# Patient Record
Sex: Female | Born: 1950
Health system: Southern US, Community
[De-identification: ages and names within clinical notes are randomized; demographics above are authoritative.]

## PROBLEM LIST (undated history)

## (undated) DIAGNOSIS — I10 Essential (primary) hypertension: Secondary | ICD-10-CM

## (undated) DIAGNOSIS — K219 Gastro-esophageal reflux disease without esophagitis: Secondary | ICD-10-CM

## (undated) DIAGNOSIS — A419 Sepsis, unspecified organism: Secondary | ICD-10-CM

## (undated) DIAGNOSIS — K650 Generalized (acute) peritonitis: Secondary | ICD-10-CM

## (undated) DIAGNOSIS — M329 Systemic lupus erythematosus, unspecified: Secondary | ICD-10-CM

## (undated) DIAGNOSIS — N19 Unspecified kidney failure: Secondary | ICD-10-CM

## (undated) DIAGNOSIS — IMO0002 Reserved for concepts with insufficient information to code with codable children: Secondary | ICD-10-CM

## (undated) DIAGNOSIS — N186 End stage renal disease: Secondary | ICD-10-CM

## (undated) HISTORY — PX: FOOT SURGERY: SHX648

## (undated) HISTORY — DX: Essential (primary) hypertension: I10

## (undated) HISTORY — PX: SMALL INTESTINE SURGERY: SHX150

---

## 1974-11-03 HISTORY — PX: SMALL INTESTINE SURGERY: SHX150

## 2019-07-28 HISTORY — PX: PERITONEAL CATHETER INSERTION: SHX2223

## 2019-09-27 HISTORY — PX: PERITONEAL CATHETER INSERTION: SHX2223

## 2020-04-23 ENCOUNTER — Other Ambulatory Visit: Payer: Self-pay

## 2020-04-23 ENCOUNTER — Encounter (HOSPITAL_COMMUNITY): Payer: Self-pay | Admitting: Emergency Medicine

## 2020-04-23 ENCOUNTER — Emergency Department (HOSPITAL_COMMUNITY)
Admission: EM | Admit: 2020-04-23 | Discharge: 2020-04-23 | Disposition: A | Discharge status: Home / Self Care | Payer: Medicare Other | Attending: Emergency Medicine | Admitting: Emergency Medicine

## 2020-04-23 DIAGNOSIS — Z5321 Procedure and treatment not carried out due to patient leaving prior to being seen by health care provider: Secondary | ICD-10-CM | POA: Diagnosis not present

## 2020-04-23 DIAGNOSIS — R000 Tachycardia, unspecified: Secondary | ICD-10-CM | POA: Insufficient documentation

## 2020-04-23 HISTORY — DX: Unspecified kidney failure: N19

## 2020-04-23 HISTORY — DX: Reserved for concepts with insufficient information to code with codable children: IMO0002

## 2020-04-23 HISTORY — DX: Systemic lupus erythematosus, unspecified: M32.9

## 2020-04-23 NOTE — ED Triage Notes (Signed)
Pt. Stated, I just finished perotoneal dialysis and watching TV and I bent over and my heart was racing. Denies any other symptoms

## 2020-04-23 NOTE — ED Notes (Signed)
Pt stated she is  Feeling better and b/c of wait she is going to follow up with her PCP instead. Pt contacted her daughter and has left

## 2020-05-20 NOTE — Progress Notes (Signed)
Cardiology Office Note:   Date:  05/21/2020  NAME:  Sheena Smith    MRN: [id number] DOB:  June 03, 1951   PCP:  No primary care provider on file.  Cardiologist:  No primary care provider on file.  Electrophysiologist:  None   Referring MD: [name], *   Chief Complaint  Patient presents with  . Tachycardia   History of Present Illness:   Sheena Smith is a 69 y.o. female with a hx of ESRD on PD who is being seen today for the evaluation of tachycardia/palpitations at the request of [name], *. She was seen in the ER 04/23/2020 and EKG showed tachycardia 124 bpm. Appears to be typical AVNRT. Left ER as felt better and was not evaluated.   She reports on 621 when she was evaluated emergency room she had just finished her peritoneal dialysis session.  Apparently she bended over and then noticed her heart was racing.  She reports that she tried deep breathing and exercise to calm herself but her heart was continuing to race.  She did not pass out or have any shortness of breath.  She did not have any chest pain she just did not feel well with her heart racing.  While waiting in the emergency room her arrhythmia broke and she went home.  She reports in January of this year she had a similar episode in [place].  She reports the episode lasted up to 1 hour and resolved without intervention.  She is never had a history of heart troubles.  She apparently was started on peritoneal dialysis in October 2020.  This was in [place].  She is recently relocated to [place] to be closer to children.  Her husband passed in February of this year due to Covid.  She does not drink alcohol or use any drugs.  She is a former smoker but smoked in her 67s for about 2 years.  She apparently is working with [facility] for possible kidney transplantation.  No recent labs in our system.  No history of heart disease.  She reports that she does not exercise but has no limitations with her current  level of activity.  This includes doing things around the house.  She has no chest pain or shortness of breath or palpitations with her current level of activity.  Her EKG demonstrates normal sinus rhythm.  1. ESRD on PD -Lupus nephritis  Past Medical History: Past Medical History:  Diagnosis Date  . Hypertension   . Lupus ([place])   . Renal failure     Past Surgical History: Past Surgical History:  Procedure Laterality Date  . FOOT SURGERY    . SMALL INTESTINE SURGERY      Current Medications: Current Meds  Medication Sig  . amLODipine (NORVASC) 10 MG tablet Take 1 tablet by mouth daily.  . hydrALAZINE (APRESOLINE) 25 MG tablet Take 25 mg by mouth 3 (three) times daily.  . hydroxychloroquine (PLAQUENIL) 200 MG tablet Take 1 tablet by mouth daily.  . propranolol (INDERAL) 20 MG tablet Take 1 and one-half tablets by mouth 3 times a day and 2 tablets at bedtime  . sevelamer carbonate (RENVELA) 2.4 g PACK Take 1 packet orally as directed 3 times daily with meal  . sodium bicarbonate 650 MG tablet Take by mouth.     Allergies:    Other, Atenolol, and Sulfur   Social History: Social History   Socioeconomic History  . Marital status: Widowed  Spouse name: Not on file  . Number of children: Not on file  . Years of education: Not on file  . Highest education level: Not on file  Occupational History  . Not on file  Tobacco Use  . Smoking status: Former Smoker    Years: 2.00  . Smokeless tobacco: Never Used  Substance and Sexual Activity  . Alcohol use: Not Currently  . Drug use: Not Currently  . Sexual activity: Not on file  Other Topics Concern  . Not on file  Social History Narrative  . Not on file   Social Determinants of Health   Financial Resource Strain:   . Difficulty of Paying Living Expenses:   Food Insecurity:   . Worried About Charity fundraiser in the Last Year:   . Arboriculturist in the Last Year:   Transportation Needs:   . Lexicographer (Medical):   Marland Kitchen Lack of Transportation (Non-Medical):   Physical Activity:   . Days of Exercise per Week:   . Minutes of Exercise per Session:   Stress:   . Feeling of Stress :   Social Connections:   . Frequency of Communication with Friends and Family:   . Frequency of Social Gatherings with Friends and Family:   . Attends Religious Services:   . Active Member of Clubs or Organizations:   . Attends Archivist Meetings:   Marland Kitchen Marital Status:      Family History: The patient's family history includes Diabetes in her father; Hypertension in her father; Ovarian cancer in her mother.  ROS:   All other ROS reviewed and negative. Pertinent positives noted in the HPI.     EKGs/Labs/Other Studies Reviewed:   The following studies were personally reviewed by me today:  EKG:  EKG is ordered today.  The ekg ordered today demonstrates normal sinus rhythm, heart rate 78, no acute ST-T changes, no evidence of prior infarction, and was personally reviewed by me.   Recent Labs: No results found for requested labs within last 8760 hours.   Recent Lipid Panel No results found for: CHOL, TRIG, HDL, CHOLHDL, VLDL, LDLCALC, LDLDIRECT  Physical Exam:   VS:  BP (!) 152/84   Pulse 78   Ht 5\' 2"  (1.575 m)   Wt 118 lb 12.8 oz (53.9 kg)   SpO2 97%   BMI 21.73 kg/m    Wt Readings from Last 3 Encounters:  05/21/20 118 lb 12.8 oz (53.9 kg)  04/23/20 120 lb (54.4 kg)    General: Well nourished, well developed, in no acute distress Heart: Atraumatic, normal size  Eyes: PEERLA, EOMI  Neck: Supple, no JVD Endocrine: No thryomegaly Cardiac: Normal S1, S2; RRR; no murmurs, rubs, or gallops Lungs: Clear to auscultation bilaterally, no wheezing, rhonchi or rales  Abd: Soft, nontender, no hepatomegaly  Ext: No edema, pulses 2+ Musculoskeletal: No deformities, BUE and BLE strength normal and equal Skin: Warm and dry, no rashes   Neuro: Alert and oriented to person, place, time,  and situation, CNII-XII grossly intact, no focal deficits  Psych: Normal mood and affect   ASSESSMENT:   Sheena Smith is a 69 y.o. female who presents for the following: 1. Tachycardia   2. SVT (supraventricular tachycardia) (HCC)     PLAN:   1. Tachycardia 2. SVT (supraventricular tachycardia) ([place]) -I reviewed her EKG from 04/23/2020.  This demonstrates a supraventricular tachycardia with heart rate 124.  She appears to have pseudo-R waves in lead  V1.  I suspect this is a typical AVNRT.  Her EKG today demonstrates normal sinus rhythm with heart rate 78.  She does not have the pseudo-R wave in lead V1.  She also likely has a pseudo-S wave in the inferior leads on EKG.  This has happened twice per her report.  We will check CBC, TSH, CMP today.  She reports dialysis is going well.  She will need an echocardiogram.  I did discuss with her that since she is had this twice I would recommend an SVT ablation.  We will refer her to EP today.  She would like to hear more about the procedure.  In the meantime she will let us know if it happens again.  She is a bit unsure if she will proceed with ablation but I think she should hear from EP about the procedure.  I will see her back in 6 months.  She will let us know if she has any further episodes.  Disposition: Return in about 6 months (around 11/21/2020).  Medication Adjustments/Labs and Tests Ordered: Current medicines are reviewed at length with the patient today.  Concerns regarding medicines are outlined above.  Orders Placed This Encounter  Procedures  . TSH  . Comprehensive metabolic panel  . CBC  . Ambulatory referral to Cardiac Electrophysiology  . EKG 12-Lead  . ECHOCARDIOGRAM COMPLETE   No orders of the defined types were placed in this encounter.   Patient Instructions  Medication Instructions:  The current medical regimen is effective;  continue present plan and medications.  *If you need a refill on your cardiac medications  before your next appointment, please call your pharmacy*   Lab Work: TSH, CMET, CBC today   If you have labs (blood work) drawn today and your tests are completely normal, you will receive your results only by: Marland Kitchen MyChart Message (if you have MyChart) OR . A paper copy in the mail If you have any lab test that is abnormal or we need to change your treatment, we will call you to review the results.   Testing/Procedures: Echocardiogram - Your physician has requested that you have an echocardiogram. Echocardiography is a painless test that uses sound waves to create images of your heart. It provides your doctor with information about the size and shape of your heart and how well your heart's chambers and valves are working. This procedure takes approximately one hour. There are no restrictions for this procedure. This will be performed at our [facility] location - [address], [address].    Follow-Up: At [facility], you and your health needs are our priority.  As part of our continuing mission to provide you with exceptional heart care, we have created designated Provider Care Teams.  These Care Teams include your primary Cardiologist (physician) and Advanced Practice Providers (APPs -  Physician Assistants and Nurse Practitioners) who all work together to provide you with the care you need, when you need it.  We recommend signing up for the patient portal called "MyChart".  Sign up information is provided on this After Visit Summary.  MyChart is used to connect with patients for Virtual Visits (Telemedicine).  Patients are able to view lab/test results, encounter notes, upcoming appointments, etc.  Non-urgent messages can be sent to your provider as well.   To learn more about what you can do with MyChart, go to [email or web address].    Your next appointment:   6 month(s)  The format for your next  appointment:   In Person  Provider:   Eleonore Chiquito, MD   Other  Instructions Referral to EP: they will contact you for an appointment.    Signed, Addison Naegeli. Audie Box, [place]  [address], [place], [place] [address] [phone]  05/21/2020 8:38 AM

## 2020-05-21 ENCOUNTER — Encounter: Payer: Self-pay | Admitting: Cardiovascular Disease

## 2020-05-21 ENCOUNTER — Other Ambulatory Visit: Payer: Self-pay

## 2020-05-21 ENCOUNTER — Ambulatory Visit: Payer: Medicare Other | Admitting: Cardiovascular Disease

## 2020-05-21 VITALS — BP 152/84 | HR 78 | Ht 62.0 in | Wt 118.8 lb

## 2020-05-21 DIAGNOSIS — R000 Tachycardia, unspecified: Secondary | ICD-10-CM

## 2020-05-21 DIAGNOSIS — I471 Supraventricular tachycardia: Secondary | ICD-10-CM | POA: Diagnosis not present

## 2020-05-21 LAB — COMPREHENSIVE METABOLIC PANEL
ALT: 5 IU/L (ref 0–32)
AST: 17 IU/L (ref 0–40)
Albumin/Globulin Ratio: 1.1 — ABNORMAL LOW (ref 1.2–2.2)
Albumin: 3.2 g/dL — ABNORMAL LOW (ref 3.8–4.8)
Alkaline Phosphatase: 70 IU/L (ref 48–121)
BUN/Creatinine Ratio: 6 — ABNORMAL LOW (ref 12–28)
BUN: 57 mg/dL — ABNORMAL HIGH (ref 8–27)
Bilirubin Total: 0.4 mg/dL (ref 0.0–1.2)
CO2: 21 mmol/L (ref 20–29)
Calcium: 6.8 mg/dL — CL (ref 8.7–10.3)
Chloride: 99 mmol/L (ref 96–106)
Creatinine, Ser: 8.98 mg/dL — ABNORMAL HIGH (ref 0.57–1.00)
GFR calc Af Amer: 5 mL/min/{1.73_m2} — ABNORMAL LOW (ref >59)
GFR calc non Af Amer: 4 mL/min/{1.73_m2} — ABNORMAL LOW (ref >59)
Globulin, Total: 3 g/dL (ref 1.5–4.5)
Glucose: 96 mg/dL (ref 65–99)
Potassium: 4.2 mmol/L (ref 3.5–5.2)
Sodium: 137 mmol/L (ref 134–144)
Total Protein: 6.2 g/dL (ref 6.0–8.5)

## 2020-05-21 LAB — CBC
Hematocrit: 34.8 % (ref 34.0–46.6)
Hemoglobin: 10.9 g/dL — ABNORMAL LOW (ref 11.1–15.9)
MCH: 28 pg (ref 26.6–33.0)
MCHC: 31.3 g/dL — ABNORMAL LOW (ref 31.5–35.7)
MCV: 90 fL (ref 79–97)
Platelets: 255 10*3/uL (ref 150–450)
RBC: 3.89 x10E6/uL (ref 3.77–5.28)
RDW: 15.1 % (ref 11.7–15.4)
WBC: 3.7 10*3/uL (ref 3.4–10.8)

## 2020-05-21 LAB — TSH: TSH: 3.86 u[IU]/mL (ref 0.450–4.500)

## 2020-05-21 NOTE — Patient Instructions (Signed)
Medication Instructions:  The current medical regimen is effective;  continue present plan and medications.  *If you need a refill on your cardiac medications before your next appointment, please call your pharmacy*   Lab Work: TSH, CMET, CBC today   If you have labs (blood work) drawn today and your tests are completely normal, you will receive your results only by: Marland Kitchen MyChart Message (if you have MyChart) OR . A paper copy in the mail If you have any lab test that is abnormal or we need to change your treatment, we will call you to review the results.   Testing/Procedures: Echocardiogram - Your physician has requested that you have an echocardiogram. Echocardiography is a painless test that uses sound waves to create images of your heart. It provides your doctor with information about the size and shape of your heart and how well your heart's chambers and valves are working. This procedure takes approximately one hour. There are no restrictions for this procedure. This will be performed at our [facility] location - [address].    Follow-Up: At [facility], you and your health needs are our priority.  As part of our continuing mission to provide you with exceptional heart care, we have created designated Provider Care Teams.  These Care Teams include your primary Cardiologist (physician) and Advanced Practice Providers (APPs -  Physician Assistants and Nurse Practitioners) who all work together to provide you with the care you need, when you need it.  We recommend signing up for the patient portal called "MyChart".  Sign up information is provided on this After Visit Summary.  MyChart is used to connect with patients for Virtual Visits (Telemedicine).  Patients are able to view lab/test results, encounter notes, upcoming appointments, etc.  Non-urgent messages can be sent to your provider as well.   To learn more about what you can do with MyChart, go to  [email or web address].    Your next appointment:   6 month(s)  The format for your next appointment:   In Person  Provider:   Eleonore Chiquito, MD   Other Instructions Referral to EP: they will contact you for an appointment.

## 2020-05-22 ENCOUNTER — Encounter: Payer: Self-pay | Admitting: Internal Medicine

## 2020-05-22 ENCOUNTER — Ambulatory Visit (INDEPENDENT_AMBULATORY_CARE_PROVIDER_SITE_OTHER): Payer: Medicare Other | Admitting: Internal Medicine

## 2020-05-22 VITALS — BP 160/80 | HR 70 | Ht 62.0 in | Wt 119.0 lb

## 2020-05-22 DIAGNOSIS — I471 Supraventricular tachycardia: Secondary | ICD-10-CM

## 2020-05-22 DIAGNOSIS — R000 Tachycardia, unspecified: Secondary | ICD-10-CM

## 2020-05-22 NOTE — Patient Instructions (Addendum)
Medication Instructions:  Your physician recommends that you continue on your current medications as directed. Please refer to the Current Medication list given to you today.  *If you need a refill on your cardiac medications before your next appointment, please call your pharmacy*  Lab Work: None ordered.  If you have labs (blood work) drawn today and your tests are completely normal, you will receive your results only by: Marland Kitchen MyChart Message (if you have MyChart) OR . A paper copy in the mail If you have any lab test that is abnormal or we need to change your treatment, we will call you to review the results.  Testing/Procedures: None ordered.  Follow-Up: At [facility], you and your health needs are our priority.  As part of our continuing mission to provide you with exceptional heart care, we have created designated Provider Care Teams.  These Care Teams include your primary Cardiologist (physician) and Advanced Practice Providers (APPs -  Physician Assistants and Nurse Practitioners) who all work together to provide you with the care you need, when you need it.  We recommend signing up for the patient portal called "MyChart".  Sign up information is provided on this After Visit Summary.  MyChart is used to connect with patients for Virtual Visits (Telemedicine).  Patients are able to view lab/test results, encounter notes, upcoming appointments, etc.  Non-urgent messages can be sent to your provider as well.   To learn more about what you can do with MyChart, go to [email or web address].    Your next appointment:   Your physician wants you to follow-up in: As needed. You will receive a reminder letter in the mail two months in advance. If you don't receive a letter, please call our office to schedule the follow-up appointment.   Other Instructions:

## 2020-05-22 NOTE — Progress Notes (Signed)
HPI Sheena Smith is referred today by Dr. Audie Box for evaluation of SVT. She is a pleasant 69 yo woman with ESRD on peritoneal dialysis. She has had 2 episodes of SVT. These start and stop suddenly and she has had HR's and documented SVT at 125/min. She notes that they start if she bends over. She has taken an extra inderal. She denies associated chest pain, sob, or syncope.  Allergies  Allergen Reactions  . Other     Per pt, states that she was informed she was allergic to sulfa after allergy testing  "Kidney Disease. Exception to this NSAID intolerance is aspirin 81-325mg  daily"'  . Atenolol     palpitation  . Sulfur      Current Outpatient Medications  Medication Sig Dispense Refill  . amLODipine (NORVASC) 10 MG tablet Take 1 tablet by mouth daily.    . hydrALAZINE (APRESOLINE) 25 MG tablet Take 25 mg by mouth 3 (three) times daily.    . hydroxychloroquine (PLAQUENIL) 200 MG tablet Take 1 tablet by mouth daily.    . propranolol (INDERAL) 20 MG tablet Take 1 and one-half tablets by mouth 3 times a day and 2 tablets at bedtime    . sevelamer carbonate (RENVELA) 2.4 g PACK Take 1 packet orally as directed 3 times daily with meal    . sodium bicarbonate 650 MG tablet Take by mouth.     No current facility-administered medications for this visit.     Past Medical History:  Diagnosis Date  . Hypertension   . Lupus ([place])   . Renal failure     ROS:   All systems reviewed and negative except as noted in the HPI.   Past Surgical History:  Procedure Laterality Date  . FOOT SURGERY    . SMALL INTESTINE SURGERY       Family History  Problem Relation Age of Onset  . Ovarian cancer Mother   . Hypertension Father   . Diabetes Father      Social History   Socioeconomic History  . Marital status: Widowed    Spouse name: Not on file  . Number of children: Not on file  . Years of education: Not on file  . Highest education level: Not on file  Occupational History  .  Not on file  Tobacco Use  . Smoking status: Former Smoker    Years: 2.00  . Smokeless tobacco: Never Used  Substance and Sexual Activity  . Alcohol use: Not Currently  . Drug use: Not Currently  . Sexual activity: Not on file  Other Topics Concern  . Not on file  Social History Narrative  . Not on file   Social Determinants of Health   Financial Resource Strain:   . Difficulty of Paying Living Expenses:   Food Insecurity:   . Worried About Charity fundraiser in the Last Year:   . Arboriculturist in the Last Year:   Transportation Needs:   . Film/video editor (Medical):   Marland Kitchen Lack of Transportation (Non-Medical):   Physical Activity:   . Days of Exercise per Week:   . Minutes of Exercise per Session:   Stress:   . Feeling of Stress :   Social Connections:   . Frequency of Communication with Friends and Family:   . Frequency of Social Gatherings with Friends and Family:   . Attends Religious Services:   . Active Member of Clubs or Organizations:   . Attends  Club or Organization Meetings:   Marland Kitchen Marital Status:   Intimate Partner Violence:   . Fear of Current or Ex-Partner:   . Emotionally Abused:   Marland Kitchen Physically Abused:   . Sexually Abused:      BP (!) 160/80   Pulse 70   Ht 5\' 2"  (1.575 m)   Wt 119 lb (54 kg)   SpO2 96%   BMI 21.77 kg/m   Physical Exam:  Well appearing NAD HEENT: Unremarkable Neck:  No JVD, no thyromegally Lymphatics:  No adenopathy Back:  No CVA tenderness Lungs:  Clear with no wheezes HEART:  Regular rate rhythm, no murmurs, no rubs, no clicks Abd:  soft, positive bowel sounds, no organomegally, no rebound, no guarding Ext:  2 plus pulses, no edema, no cyanosis, no clubbing Skin:  No rashes no nodules Neuro:  CN II through XII intact, motor grossly intact  EKG - nsr with no ventricular pre-excitation   Assess/Plan: 1. SVT - She most likely has AVNRT. I discussed the treatment options including catheter ablation. However, she is  not at this point symptomatic enough for me to recommend ablation. I have recommended watchful waiting. If her symptoms worsen then ablation would be warranted.  2. HTN - her bp is controlled today.   Ponciano Ort,

## 2020-06-03 DIAGNOSIS — N186 End stage renal disease: Secondary | ICD-10-CM | POA: Diagnosis not present

## 2020-06-03 DIAGNOSIS — Z992 Dependence on renal dialysis: Secondary | ICD-10-CM | POA: Diagnosis not present

## 2020-06-04 DIAGNOSIS — D509 Iron deficiency anemia, unspecified: Secondary | ICD-10-CM | POA: Diagnosis not present

## 2020-06-04 DIAGNOSIS — N186 End stage renal disease: Secondary | ICD-10-CM | POA: Diagnosis not present

## 2020-06-04 DIAGNOSIS — Z992 Dependence on renal dialysis: Secondary | ICD-10-CM | POA: Diagnosis not present

## 2020-06-05 DIAGNOSIS — N186 End stage renal disease: Secondary | ICD-10-CM | POA: Diagnosis not present

## 2020-06-05 DIAGNOSIS — Z992 Dependence on renal dialysis: Secondary | ICD-10-CM | POA: Diagnosis not present

## 2020-06-06 DIAGNOSIS — Z992 Dependence on renal dialysis: Secondary | ICD-10-CM | POA: Diagnosis not present

## 2020-06-06 DIAGNOSIS — N186 End stage renal disease: Secondary | ICD-10-CM | POA: Diagnosis not present

## 2020-06-07 DIAGNOSIS — Z992 Dependence on renal dialysis: Secondary | ICD-10-CM | POA: Diagnosis not present

## 2020-06-07 DIAGNOSIS — N186 End stage renal disease: Secondary | ICD-10-CM | POA: Diagnosis not present

## 2020-06-08 DIAGNOSIS — N186 End stage renal disease: Secondary | ICD-10-CM | POA: Diagnosis not present

## 2020-06-08 DIAGNOSIS — Z992 Dependence on renal dialysis: Secondary | ICD-10-CM | POA: Diagnosis not present

## 2020-06-09 DIAGNOSIS — N186 End stage renal disease: Secondary | ICD-10-CM | POA: Diagnosis not present

## 2020-06-09 DIAGNOSIS — Z992 Dependence on renal dialysis: Secondary | ICD-10-CM | POA: Diagnosis not present

## 2020-06-10 DIAGNOSIS — Z992 Dependence on renal dialysis: Secondary | ICD-10-CM | POA: Diagnosis not present

## 2020-06-10 DIAGNOSIS — N186 End stage renal disease: Secondary | ICD-10-CM | POA: Diagnosis not present

## 2020-06-11 DIAGNOSIS — Z992 Dependence on renal dialysis: Secondary | ICD-10-CM | POA: Diagnosis not present

## 2020-06-11 DIAGNOSIS — N186 End stage renal disease: Secondary | ICD-10-CM | POA: Diagnosis not present

## 2020-06-12 DIAGNOSIS — Z992 Dependence on renal dialysis: Secondary | ICD-10-CM | POA: Diagnosis not present

## 2020-06-12 DIAGNOSIS — N186 End stage renal disease: Secondary | ICD-10-CM | POA: Diagnosis not present

## 2020-06-13 DIAGNOSIS — N186 End stage renal disease: Secondary | ICD-10-CM | POA: Diagnosis not present

## 2020-06-13 DIAGNOSIS — Z992 Dependence on renal dialysis: Secondary | ICD-10-CM | POA: Diagnosis not present

## 2020-06-14 DIAGNOSIS — N186 End stage renal disease: Secondary | ICD-10-CM | POA: Diagnosis not present

## 2020-06-14 DIAGNOSIS — Z992 Dependence on renal dialysis: Secondary | ICD-10-CM | POA: Diagnosis not present

## 2020-06-15 DIAGNOSIS — Z992 Dependence on renal dialysis: Secondary | ICD-10-CM | POA: Diagnosis not present

## 2020-06-15 DIAGNOSIS — N186 End stage renal disease: Secondary | ICD-10-CM | POA: Diagnosis not present

## 2020-06-16 DIAGNOSIS — Z992 Dependence on renal dialysis: Secondary | ICD-10-CM | POA: Diagnosis not present

## 2020-06-16 DIAGNOSIS — N186 End stage renal disease: Secondary | ICD-10-CM | POA: Diagnosis not present

## 2020-06-17 DIAGNOSIS — N186 End stage renal disease: Secondary | ICD-10-CM | POA: Diagnosis not present

## 2020-06-17 DIAGNOSIS — Z992 Dependence on renal dialysis: Secondary | ICD-10-CM | POA: Diagnosis not present

## 2020-06-18 DIAGNOSIS — Z992 Dependence on renal dialysis: Secondary | ICD-10-CM | POA: Diagnosis not present

## 2020-06-18 DIAGNOSIS — N186 End stage renal disease: Secondary | ICD-10-CM | POA: Diagnosis not present

## 2020-06-19 DIAGNOSIS — N186 End stage renal disease: Secondary | ICD-10-CM | POA: Diagnosis not present

## 2020-06-19 DIAGNOSIS — Z992 Dependence on renal dialysis: Secondary | ICD-10-CM | POA: Diagnosis not present

## 2020-06-20 DIAGNOSIS — Z992 Dependence on renal dialysis: Secondary | ICD-10-CM | POA: Diagnosis not present

## 2020-06-20 DIAGNOSIS — N186 End stage renal disease: Secondary | ICD-10-CM | POA: Diagnosis not present

## 2020-06-21 DIAGNOSIS — N186 End stage renal disease: Secondary | ICD-10-CM | POA: Diagnosis not present

## 2020-06-21 DIAGNOSIS — Z992 Dependence on renal dialysis: Secondary | ICD-10-CM | POA: Diagnosis not present

## 2020-06-22 DIAGNOSIS — N186 End stage renal disease: Secondary | ICD-10-CM | POA: Diagnosis not present

## 2020-06-22 DIAGNOSIS — Z992 Dependence on renal dialysis: Secondary | ICD-10-CM | POA: Diagnosis not present

## 2020-06-23 DIAGNOSIS — N186 End stage renal disease: Secondary | ICD-10-CM | POA: Diagnosis not present

## 2020-06-23 DIAGNOSIS — Z992 Dependence on renal dialysis: Secondary | ICD-10-CM | POA: Diagnosis not present

## 2020-06-24 DIAGNOSIS — N186 End stage renal disease: Secondary | ICD-10-CM | POA: Diagnosis not present

## 2020-06-24 DIAGNOSIS — Z992 Dependence on renal dialysis: Secondary | ICD-10-CM | POA: Diagnosis not present

## 2020-06-25 DIAGNOSIS — N186 End stage renal disease: Secondary | ICD-10-CM | POA: Diagnosis not present

## 2020-06-25 DIAGNOSIS — Z992 Dependence on renal dialysis: Secondary | ICD-10-CM | POA: Diagnosis not present

## 2020-06-26 DIAGNOSIS — Z992 Dependence on renal dialysis: Secondary | ICD-10-CM | POA: Diagnosis not present

## 2020-06-26 DIAGNOSIS — N186 End stage renal disease: Secondary | ICD-10-CM | POA: Diagnosis not present

## 2020-06-27 DIAGNOSIS — Z992 Dependence on renal dialysis: Secondary | ICD-10-CM | POA: Diagnosis not present

## 2020-06-27 DIAGNOSIS — N186 End stage renal disease: Secondary | ICD-10-CM | POA: Diagnosis not present

## 2020-06-28 DIAGNOSIS — N186 End stage renal disease: Secondary | ICD-10-CM | POA: Diagnosis not present

## 2020-06-28 DIAGNOSIS — Z992 Dependence on renal dialysis: Secondary | ICD-10-CM | POA: Diagnosis not present

## 2020-06-29 DIAGNOSIS — Z992 Dependence on renal dialysis: Secondary | ICD-10-CM | POA: Diagnosis not present

## 2020-06-29 DIAGNOSIS — N186 End stage renal disease: Secondary | ICD-10-CM | POA: Diagnosis not present

## 2020-06-30 DIAGNOSIS — N186 End stage renal disease: Secondary | ICD-10-CM | POA: Diagnosis not present

## 2020-06-30 DIAGNOSIS — Z992 Dependence on renal dialysis: Secondary | ICD-10-CM | POA: Diagnosis not present

## 2020-07-01 DIAGNOSIS — Z992 Dependence on renal dialysis: Secondary | ICD-10-CM | POA: Diagnosis not present

## 2020-07-01 DIAGNOSIS — N186 End stage renal disease: Secondary | ICD-10-CM | POA: Diagnosis not present

## 2020-07-02 ENCOUNTER — Other Ambulatory Visit (HOSPITAL_COMMUNITY): Payer: Medicare Other

## 2020-07-02 DIAGNOSIS — N186 End stage renal disease: Secondary | ICD-10-CM | POA: Diagnosis not present

## 2020-07-02 DIAGNOSIS — Z992 Dependence on renal dialysis: Secondary | ICD-10-CM | POA: Diagnosis not present

## 2020-07-03 DIAGNOSIS — Z992 Dependence on renal dialysis: Secondary | ICD-10-CM | POA: Diagnosis not present

## 2020-07-03 DIAGNOSIS — N186 End stage renal disease: Secondary | ICD-10-CM | POA: Diagnosis not present

## 2020-07-04 ENCOUNTER — Encounter (HOSPITAL_COMMUNITY): Payer: Self-pay | Admitting: Cardiovascular Disease

## 2020-07-04 DIAGNOSIS — N186 End stage renal disease: Secondary | ICD-10-CM | POA: Diagnosis not present

## 2020-07-04 DIAGNOSIS — Z992 Dependence on renal dialysis: Secondary | ICD-10-CM | POA: Diagnosis not present

## 2020-07-05 DIAGNOSIS — Z992 Dependence on renal dialysis: Secondary | ICD-10-CM | POA: Diagnosis not present

## 2020-07-05 DIAGNOSIS — N186 End stage renal disease: Secondary | ICD-10-CM | POA: Diagnosis not present

## 2020-07-06 DIAGNOSIS — N186 End stage renal disease: Secondary | ICD-10-CM | POA: Diagnosis not present

## 2020-07-06 DIAGNOSIS — Z992 Dependence on renal dialysis: Secondary | ICD-10-CM | POA: Diagnosis not present

## 2020-07-07 DIAGNOSIS — N186 End stage renal disease: Secondary | ICD-10-CM | POA: Diagnosis not present

## 2020-07-07 DIAGNOSIS — Z992 Dependence on renal dialysis: Secondary | ICD-10-CM | POA: Diagnosis not present

## 2020-07-08 DIAGNOSIS — N186 End stage renal disease: Secondary | ICD-10-CM | POA: Diagnosis not present

## 2020-07-08 DIAGNOSIS — Z992 Dependence on renal dialysis: Secondary | ICD-10-CM | POA: Diagnosis not present

## 2020-07-09 DIAGNOSIS — N186 End stage renal disease: Secondary | ICD-10-CM | POA: Diagnosis not present

## 2020-07-09 DIAGNOSIS — Z992 Dependence on renal dialysis: Secondary | ICD-10-CM | POA: Diagnosis not present

## 2020-07-10 DIAGNOSIS — Z136 Encounter for screening for cardiovascular disorders: Secondary | ICD-10-CM | POA: Diagnosis not present

## 2020-07-10 DIAGNOSIS — Z992 Dependence on renal dialysis: Secondary | ICD-10-CM | POA: Diagnosis not present

## 2020-07-10 DIAGNOSIS — N186 End stage renal disease: Secondary | ICD-10-CM | POA: Diagnosis not present

## 2020-07-10 DIAGNOSIS — I151 Hypertension secondary to other renal disorders: Secondary | ICD-10-CM | POA: Diagnosis not present

## 2020-07-11 DIAGNOSIS — Z992 Dependence on renal dialysis: Secondary | ICD-10-CM | POA: Diagnosis not present

## 2020-07-11 DIAGNOSIS — D509 Iron deficiency anemia, unspecified: Secondary | ICD-10-CM | POA: Diagnosis not present

## 2020-07-11 DIAGNOSIS — N186 End stage renal disease: Secondary | ICD-10-CM | POA: Diagnosis not present

## 2020-07-12 DIAGNOSIS — I151 Hypertension secondary to other renal disorders: Secondary | ICD-10-CM | POA: Diagnosis not present

## 2020-07-12 DIAGNOSIS — Z992 Dependence on renal dialysis: Secondary | ICD-10-CM | POA: Diagnosis not present

## 2020-07-12 DIAGNOSIS — Z0000 Encounter for general adult medical examination without abnormal findings: Secondary | ICD-10-CM | POA: Diagnosis not present

## 2020-07-12 DIAGNOSIS — N186 End stage renal disease: Secondary | ICD-10-CM | POA: Diagnosis not present

## 2020-07-13 DIAGNOSIS — N186 End stage renal disease: Secondary | ICD-10-CM | POA: Diagnosis not present

## 2020-07-13 DIAGNOSIS — Z992 Dependence on renal dialysis: Secondary | ICD-10-CM | POA: Diagnosis not present

## 2020-07-14 DIAGNOSIS — Z992 Dependence on renal dialysis: Secondary | ICD-10-CM | POA: Diagnosis not present

## 2020-07-14 DIAGNOSIS — N186 End stage renal disease: Secondary | ICD-10-CM | POA: Diagnosis not present

## 2020-07-15 DIAGNOSIS — Z992 Dependence on renal dialysis: Secondary | ICD-10-CM | POA: Diagnosis not present

## 2020-07-15 DIAGNOSIS — N186 End stage renal disease: Secondary | ICD-10-CM | POA: Diagnosis not present

## 2020-07-16 ENCOUNTER — Telehealth (HOSPITAL_COMMUNITY): Payer: Self-pay | Admitting: Cardiovascular Disease

## 2020-07-16 DIAGNOSIS — N186 End stage renal disease: Secondary | ICD-10-CM | POA: Diagnosis not present

## 2020-07-16 DIAGNOSIS — Z992 Dependence on renal dialysis: Secondary | ICD-10-CM | POA: Diagnosis not present

## 2020-07-16 NOTE — Telephone Encounter (Signed)
Just an FYI. We have made several attempts to contact this patient including sending a letter to schedule or reschedule their echocardiogram. We will be removing the patient from the echo [place].    07/04/2020 MAILED LETTER/[facility]  07/02/20 No Show       Thank you

## 2020-07-16 NOTE — Telephone Encounter (Signed)
Noted. Thanks.

## 2020-07-17 ENCOUNTER — Other Ambulatory Visit: Payer: Self-pay | Admitting: Family Medicine

## 2020-07-17 DIAGNOSIS — E2839 Other primary ovarian failure: Secondary | ICD-10-CM

## 2020-07-17 DIAGNOSIS — Z1231 Encounter for screening mammogram for malignant neoplasm of breast: Secondary | ICD-10-CM

## 2020-07-17 DIAGNOSIS — N186 End stage renal disease: Secondary | ICD-10-CM | POA: Diagnosis not present

## 2020-07-17 DIAGNOSIS — Z992 Dependence on renal dialysis: Secondary | ICD-10-CM | POA: Diagnosis not present

## 2020-07-18 DIAGNOSIS — N186 End stage renal disease: Secondary | ICD-10-CM | POA: Diagnosis not present

## 2020-07-18 DIAGNOSIS — Z992 Dependence on renal dialysis: Secondary | ICD-10-CM | POA: Diagnosis not present

## 2020-07-19 DIAGNOSIS — N186 End stage renal disease: Secondary | ICD-10-CM | POA: Diagnosis not present

## 2020-07-19 DIAGNOSIS — M3210 Systemic lupus erythematosus, organ or system involvement unspecified: Secondary | ICD-10-CM | POA: Diagnosis not present

## 2020-07-19 DIAGNOSIS — Z992 Dependence on renal dialysis: Secondary | ICD-10-CM | POA: Diagnosis not present

## 2020-07-19 DIAGNOSIS — Z79899 Other long term (current) drug therapy: Secondary | ICD-10-CM | POA: Diagnosis not present

## 2020-07-20 DIAGNOSIS — Z992 Dependence on renal dialysis: Secondary | ICD-10-CM | POA: Diagnosis not present

## 2020-07-20 DIAGNOSIS — N186 End stage renal disease: Secondary | ICD-10-CM | POA: Diagnosis not present

## 2020-07-21 DIAGNOSIS — N186 End stage renal disease: Secondary | ICD-10-CM | POA: Diagnosis not present

## 2020-07-21 DIAGNOSIS — Z992 Dependence on renal dialysis: Secondary | ICD-10-CM | POA: Diagnosis not present

## 2020-07-22 DIAGNOSIS — N186 End stage renal disease: Secondary | ICD-10-CM | POA: Diagnosis not present

## 2020-07-22 DIAGNOSIS — Z992 Dependence on renal dialysis: Secondary | ICD-10-CM | POA: Diagnosis not present

## 2020-07-23 DIAGNOSIS — Z79899 Other long term (current) drug therapy: Secondary | ICD-10-CM | POA: Diagnosis not present

## 2020-07-23 DIAGNOSIS — H353131 Nonexudative age-related macular degeneration, bilateral, early dry stage: Secondary | ICD-10-CM | POA: Diagnosis not present

## 2020-07-23 DIAGNOSIS — M3210 Systemic lupus erythematosus, organ or system involvement unspecified: Secondary | ICD-10-CM | POA: Diagnosis not present

## 2020-07-23 DIAGNOSIS — N186 End stage renal disease: Secondary | ICD-10-CM | POA: Diagnosis not present

## 2020-07-23 DIAGNOSIS — Z992 Dependence on renal dialysis: Secondary | ICD-10-CM | POA: Diagnosis not present

## 2020-07-23 DIAGNOSIS — H3561 Retinal hemorrhage, right eye: Secondary | ICD-10-CM | POA: Diagnosis not present

## 2020-07-24 DIAGNOSIS — Z992 Dependence on renal dialysis: Secondary | ICD-10-CM | POA: Diagnosis not present

## 2020-07-24 DIAGNOSIS — N186 End stage renal disease: Secondary | ICD-10-CM | POA: Diagnosis not present

## 2020-07-25 DIAGNOSIS — N186 End stage renal disease: Secondary | ICD-10-CM | POA: Diagnosis not present

## 2020-07-25 DIAGNOSIS — Z992 Dependence on renal dialysis: Secondary | ICD-10-CM | POA: Diagnosis not present

## 2020-07-26 DIAGNOSIS — Z992 Dependence on renal dialysis: Secondary | ICD-10-CM | POA: Diagnosis not present

## 2020-07-26 DIAGNOSIS — N186 End stage renal disease: Secondary | ICD-10-CM | POA: Diagnosis not present

## 2020-07-27 DIAGNOSIS — N186 End stage renal disease: Secondary | ICD-10-CM | POA: Diagnosis not present

## 2020-07-27 DIAGNOSIS — Z992 Dependence on renal dialysis: Secondary | ICD-10-CM | POA: Diagnosis not present

## 2020-07-28 DIAGNOSIS — N186 End stage renal disease: Secondary | ICD-10-CM | POA: Diagnosis not present

## 2020-07-28 DIAGNOSIS — Z992 Dependence on renal dialysis: Secondary | ICD-10-CM | POA: Diagnosis not present

## 2020-07-29 DIAGNOSIS — Z992 Dependence on renal dialysis: Secondary | ICD-10-CM | POA: Diagnosis not present

## 2020-07-29 DIAGNOSIS — N186 End stage renal disease: Secondary | ICD-10-CM | POA: Diagnosis not present

## 2020-07-30 DIAGNOSIS — N186 End stage renal disease: Secondary | ICD-10-CM | POA: Diagnosis not present

## 2020-07-30 DIAGNOSIS — Z992 Dependence on renal dialysis: Secondary | ICD-10-CM | POA: Diagnosis not present

## 2020-07-31 DIAGNOSIS — Z992 Dependence on renal dialysis: Secondary | ICD-10-CM | POA: Diagnosis not present

## 2020-07-31 DIAGNOSIS — N186 End stage renal disease: Secondary | ICD-10-CM | POA: Diagnosis not present

## 2020-08-01 DIAGNOSIS — Z992 Dependence on renal dialysis: Secondary | ICD-10-CM | POA: Diagnosis not present

## 2020-08-01 DIAGNOSIS — N186 End stage renal disease: Secondary | ICD-10-CM | POA: Diagnosis not present

## 2020-08-02 ENCOUNTER — Ambulatory Visit: Payer: Medicare Other

## 2020-08-02 DIAGNOSIS — Z992 Dependence on renal dialysis: Secondary | ICD-10-CM | POA: Diagnosis not present

## 2020-08-02 DIAGNOSIS — N186 End stage renal disease: Secondary | ICD-10-CM | POA: Diagnosis not present

## 2020-08-03 ENCOUNTER — Ambulatory Visit (HOSPITAL_COMMUNITY): Payer: Medicare Other | Attending: Cardiovascular Disease

## 2020-08-03 ENCOUNTER — Other Ambulatory Visit: Payer: Self-pay

## 2020-08-03 DIAGNOSIS — Z992 Dependence on renal dialysis: Secondary | ICD-10-CM | POA: Diagnosis not present

## 2020-08-03 DIAGNOSIS — I471 Supraventricular tachycardia: Secondary | ICD-10-CM | POA: Insufficient documentation

## 2020-08-03 DIAGNOSIS — Z23 Encounter for immunization: Secondary | ICD-10-CM | POA: Diagnosis not present

## 2020-08-03 DIAGNOSIS — N186 End stage renal disease: Secondary | ICD-10-CM | POA: Diagnosis not present

## 2020-08-03 LAB — ECHOCARDIOGRAM COMPLETE
Area-P 1/2: 3.46 cm2
S' Lateral: 3 cm

## 2020-08-04 DIAGNOSIS — N186 End stage renal disease: Secondary | ICD-10-CM | POA: Diagnosis not present

## 2020-08-04 DIAGNOSIS — Z992 Dependence on renal dialysis: Secondary | ICD-10-CM | POA: Diagnosis not present

## 2020-08-04 DIAGNOSIS — Z23 Encounter for immunization: Secondary | ICD-10-CM | POA: Diagnosis not present

## 2020-08-05 DIAGNOSIS — Z992 Dependence on renal dialysis: Secondary | ICD-10-CM | POA: Diagnosis not present

## 2020-08-05 DIAGNOSIS — Z23 Encounter for immunization: Secondary | ICD-10-CM | POA: Diagnosis not present

## 2020-08-05 DIAGNOSIS — N186 End stage renal disease: Secondary | ICD-10-CM | POA: Diagnosis not present

## 2020-08-06 DIAGNOSIS — Z992 Dependence on renal dialysis: Secondary | ICD-10-CM | POA: Diagnosis not present

## 2020-08-06 DIAGNOSIS — Z23 Encounter for immunization: Secondary | ICD-10-CM | POA: Diagnosis not present

## 2020-08-06 DIAGNOSIS — N186 End stage renal disease: Secondary | ICD-10-CM | POA: Diagnosis not present

## 2020-08-07 DIAGNOSIS — N186 End stage renal disease: Secondary | ICD-10-CM | POA: Diagnosis not present

## 2020-08-07 DIAGNOSIS — D509 Iron deficiency anemia, unspecified: Secondary | ICD-10-CM | POA: Diagnosis not present

## 2020-08-07 DIAGNOSIS — Z992 Dependence on renal dialysis: Secondary | ICD-10-CM | POA: Diagnosis not present

## 2020-08-07 DIAGNOSIS — Z23 Encounter for immunization: Secondary | ICD-10-CM | POA: Diagnosis not present

## 2020-08-08 DIAGNOSIS — Z23 Encounter for immunization: Secondary | ICD-10-CM | POA: Diagnosis not present

## 2020-08-08 DIAGNOSIS — Z992 Dependence on renal dialysis: Secondary | ICD-10-CM | POA: Diagnosis not present

## 2020-08-08 DIAGNOSIS — N186 End stage renal disease: Secondary | ICD-10-CM | POA: Diagnosis not present

## 2020-08-09 DIAGNOSIS — N186 End stage renal disease: Secondary | ICD-10-CM | POA: Diagnosis not present

## 2020-08-09 DIAGNOSIS — Z23 Encounter for immunization: Secondary | ICD-10-CM | POA: Diagnosis not present

## 2020-08-09 DIAGNOSIS — Z992 Dependence on renal dialysis: Secondary | ICD-10-CM | POA: Diagnosis not present

## 2020-08-10 DIAGNOSIS — Z23 Encounter for immunization: Secondary | ICD-10-CM | POA: Diagnosis not present

## 2020-08-10 DIAGNOSIS — Z992 Dependence on renal dialysis: Secondary | ICD-10-CM | POA: Diagnosis not present

## 2020-08-10 DIAGNOSIS — N186 End stage renal disease: Secondary | ICD-10-CM | POA: Diagnosis not present

## 2020-08-11 DIAGNOSIS — Z992 Dependence on renal dialysis: Secondary | ICD-10-CM | POA: Diagnosis not present

## 2020-08-11 DIAGNOSIS — Z23 Encounter for immunization: Secondary | ICD-10-CM | POA: Diagnosis not present

## 2020-08-11 DIAGNOSIS — N186 End stage renal disease: Secondary | ICD-10-CM | POA: Diagnosis not present

## 2020-08-12 DIAGNOSIS — N186 End stage renal disease: Secondary | ICD-10-CM | POA: Diagnosis not present

## 2020-08-12 DIAGNOSIS — Z23 Encounter for immunization: Secondary | ICD-10-CM | POA: Diagnosis not present

## 2020-08-12 DIAGNOSIS — Z992 Dependence on renal dialysis: Secondary | ICD-10-CM | POA: Diagnosis not present

## 2020-08-13 DIAGNOSIS — Z23 Encounter for immunization: Secondary | ICD-10-CM | POA: Diagnosis not present

## 2020-08-13 DIAGNOSIS — N186 End stage renal disease: Secondary | ICD-10-CM | POA: Diagnosis not present

## 2020-08-13 DIAGNOSIS — Z992 Dependence on renal dialysis: Secondary | ICD-10-CM | POA: Diagnosis not present

## 2020-08-14 ENCOUNTER — Other Ambulatory Visit: Payer: Self-pay

## 2020-08-14 ENCOUNTER — Ambulatory Visit
Admission: RE | Admit: 2020-08-14 | Discharge: 2020-08-14 | Disposition: A | Discharge status: Home / Self Care | Payer: Medicare Other | Source: Ambulatory Visit | Attending: Family Medicine | Admitting: Family Medicine

## 2020-08-14 DIAGNOSIS — Z1231 Encounter for screening mammogram for malignant neoplasm of breast: Secondary | ICD-10-CM

## 2020-08-14 DIAGNOSIS — N186 End stage renal disease: Secondary | ICD-10-CM | POA: Diagnosis not present

## 2020-08-14 DIAGNOSIS — Z992 Dependence on renal dialysis: Secondary | ICD-10-CM | POA: Diagnosis not present

## 2020-08-14 DIAGNOSIS — Z23 Encounter for immunization: Secondary | ICD-10-CM | POA: Diagnosis not present

## 2020-08-15 DIAGNOSIS — Z23 Encounter for immunization: Secondary | ICD-10-CM | POA: Diagnosis not present

## 2020-08-15 DIAGNOSIS — Z992 Dependence on renal dialysis: Secondary | ICD-10-CM | POA: Diagnosis not present

## 2020-08-15 DIAGNOSIS — N186 End stage renal disease: Secondary | ICD-10-CM | POA: Diagnosis not present

## 2020-08-16 DIAGNOSIS — Z992 Dependence on renal dialysis: Secondary | ICD-10-CM | POA: Diagnosis not present

## 2020-08-16 DIAGNOSIS — Z23 Encounter for immunization: Secondary | ICD-10-CM | POA: Diagnosis not present

## 2020-08-16 DIAGNOSIS — N186 End stage renal disease: Secondary | ICD-10-CM | POA: Diagnosis not present

## 2020-08-17 DIAGNOSIS — N186 End stage renal disease: Secondary | ICD-10-CM | POA: Diagnosis not present

## 2020-08-17 DIAGNOSIS — Z992 Dependence on renal dialysis: Secondary | ICD-10-CM | POA: Diagnosis not present

## 2020-08-17 DIAGNOSIS — Z23 Encounter for immunization: Secondary | ICD-10-CM | POA: Diagnosis not present

## 2020-08-18 DIAGNOSIS — Z992 Dependence on renal dialysis: Secondary | ICD-10-CM | POA: Diagnosis not present

## 2020-08-18 DIAGNOSIS — Z23 Encounter for immunization: Secondary | ICD-10-CM | POA: Diagnosis not present

## 2020-08-18 DIAGNOSIS — N186 End stage renal disease: Secondary | ICD-10-CM | POA: Diagnosis not present

## 2020-08-19 DIAGNOSIS — Z992 Dependence on renal dialysis: Secondary | ICD-10-CM | POA: Diagnosis not present

## 2020-08-19 DIAGNOSIS — Z23 Encounter for immunization: Secondary | ICD-10-CM | POA: Diagnosis not present

## 2020-08-19 DIAGNOSIS — N186 End stage renal disease: Secondary | ICD-10-CM | POA: Diagnosis not present

## 2020-08-20 DIAGNOSIS — Z992 Dependence on renal dialysis: Secondary | ICD-10-CM | POA: Diagnosis not present

## 2020-08-20 DIAGNOSIS — N186 End stage renal disease: Secondary | ICD-10-CM | POA: Diagnosis not present

## 2020-08-20 DIAGNOSIS — Z23 Encounter for immunization: Secondary | ICD-10-CM | POA: Diagnosis not present

## 2020-08-21 DIAGNOSIS — Z23 Encounter for immunization: Secondary | ICD-10-CM | POA: Diagnosis not present

## 2020-08-21 DIAGNOSIS — N186 End stage renal disease: Secondary | ICD-10-CM | POA: Diagnosis not present

## 2020-08-21 DIAGNOSIS — Z992 Dependence on renal dialysis: Secondary | ICD-10-CM | POA: Diagnosis not present

## 2020-08-22 DIAGNOSIS — Z992 Dependence on renal dialysis: Secondary | ICD-10-CM | POA: Diagnosis not present

## 2020-08-22 DIAGNOSIS — N186 End stage renal disease: Secondary | ICD-10-CM | POA: Diagnosis not present

## 2020-08-22 DIAGNOSIS — Z23 Encounter for immunization: Secondary | ICD-10-CM | POA: Diagnosis not present

## 2020-08-23 DIAGNOSIS — N186 End stage renal disease: Secondary | ICD-10-CM | POA: Diagnosis not present

## 2020-08-23 DIAGNOSIS — Z23 Encounter for immunization: Secondary | ICD-10-CM | POA: Diagnosis not present

## 2020-08-23 DIAGNOSIS — Z992 Dependence on renal dialysis: Secondary | ICD-10-CM | POA: Diagnosis not present

## 2020-08-24 DIAGNOSIS — Z992 Dependence on renal dialysis: Secondary | ICD-10-CM | POA: Diagnosis not present

## 2020-08-24 DIAGNOSIS — Z23 Encounter for immunization: Secondary | ICD-10-CM | POA: Diagnosis not present

## 2020-08-24 DIAGNOSIS — N186 End stage renal disease: Secondary | ICD-10-CM | POA: Diagnosis not present

## 2020-08-25 DIAGNOSIS — Z992 Dependence on renal dialysis: Secondary | ICD-10-CM | POA: Diagnosis not present

## 2020-08-25 DIAGNOSIS — Z23 Encounter for immunization: Secondary | ICD-10-CM | POA: Diagnosis not present

## 2020-08-25 DIAGNOSIS — N186 End stage renal disease: Secondary | ICD-10-CM | POA: Diagnosis not present

## 2020-08-26 DIAGNOSIS — Z992 Dependence on renal dialysis: Secondary | ICD-10-CM | POA: Diagnosis not present

## 2020-08-26 DIAGNOSIS — Z23 Encounter for immunization: Secondary | ICD-10-CM | POA: Diagnosis not present

## 2020-08-26 DIAGNOSIS — N186 End stage renal disease: Secondary | ICD-10-CM | POA: Diagnosis not present

## 2020-08-27 DIAGNOSIS — N186 End stage renal disease: Secondary | ICD-10-CM | POA: Diagnosis not present

## 2020-08-27 DIAGNOSIS — Z23 Encounter for immunization: Secondary | ICD-10-CM | POA: Diagnosis not present

## 2020-08-27 DIAGNOSIS — Z992 Dependence on renal dialysis: Secondary | ICD-10-CM | POA: Diagnosis not present

## 2020-08-28 DIAGNOSIS — N186 End stage renal disease: Secondary | ICD-10-CM | POA: Diagnosis not present

## 2020-08-28 DIAGNOSIS — Z992 Dependence on renal dialysis: Secondary | ICD-10-CM | POA: Diagnosis not present

## 2020-08-28 DIAGNOSIS — Z23 Encounter for immunization: Secondary | ICD-10-CM | POA: Diagnosis not present

## 2020-08-29 DIAGNOSIS — Z23 Encounter for immunization: Secondary | ICD-10-CM | POA: Diagnosis not present

## 2020-08-29 DIAGNOSIS — N186 End stage renal disease: Secondary | ICD-10-CM | POA: Diagnosis not present

## 2020-08-29 DIAGNOSIS — Z992 Dependence on renal dialysis: Secondary | ICD-10-CM | POA: Diagnosis not present

## 2020-08-30 DIAGNOSIS — M3214 Glomerular disease in systemic lupus erythematosus: Secondary | ICD-10-CM | POA: Diagnosis not present

## 2020-08-30 DIAGNOSIS — M329 Systemic lupus erythematosus, unspecified: Secondary | ICD-10-CM | POA: Diagnosis not present

## 2020-08-30 DIAGNOSIS — M7022 Olecranon bursitis, left elbow: Secondary | ICD-10-CM | POA: Diagnosis not present

## 2020-08-30 DIAGNOSIS — M7989 Other specified soft tissue disorders: Secondary | ICD-10-CM | POA: Diagnosis not present

## 2020-08-30 DIAGNOSIS — Z23 Encounter for immunization: Secondary | ICD-10-CM | POA: Diagnosis not present

## 2020-08-30 DIAGNOSIS — Z992 Dependence on renal dialysis: Secondary | ICD-10-CM | POA: Diagnosis not present

## 2020-08-30 DIAGNOSIS — N186 End stage renal disease: Secondary | ICD-10-CM | POA: Diagnosis not present

## 2020-08-31 DIAGNOSIS — Z992 Dependence on renal dialysis: Secondary | ICD-10-CM | POA: Diagnosis not present

## 2020-08-31 DIAGNOSIS — Z23 Encounter for immunization: Secondary | ICD-10-CM | POA: Diagnosis not present

## 2020-08-31 DIAGNOSIS — N186 End stage renal disease: Secondary | ICD-10-CM | POA: Diagnosis not present

## 2020-09-01 DIAGNOSIS — N186 End stage renal disease: Secondary | ICD-10-CM | POA: Diagnosis not present

## 2020-09-01 DIAGNOSIS — Z23 Encounter for immunization: Secondary | ICD-10-CM | POA: Diagnosis not present

## 2020-09-01 DIAGNOSIS — Z992 Dependence on renal dialysis: Secondary | ICD-10-CM | POA: Diagnosis not present

## 2020-09-02 DIAGNOSIS — Z23 Encounter for immunization: Secondary | ICD-10-CM | POA: Diagnosis not present

## 2020-09-02 DIAGNOSIS — N186 End stage renal disease: Secondary | ICD-10-CM | POA: Diagnosis not present

## 2020-09-02 DIAGNOSIS — Z992 Dependence on renal dialysis: Secondary | ICD-10-CM | POA: Diagnosis not present

## 2020-09-03 DIAGNOSIS — N186 End stage renal disease: Secondary | ICD-10-CM | POA: Diagnosis not present

## 2020-09-03 DIAGNOSIS — Z992 Dependence on renal dialysis: Secondary | ICD-10-CM | POA: Diagnosis not present

## 2020-09-04 DIAGNOSIS — N186 End stage renal disease: Secondary | ICD-10-CM | POA: Diagnosis not present

## 2020-09-04 DIAGNOSIS — Z992 Dependence on renal dialysis: Secondary | ICD-10-CM | POA: Diagnosis not present

## 2020-09-04 DIAGNOSIS — D509 Iron deficiency anemia, unspecified: Secondary | ICD-10-CM | POA: Diagnosis not present

## 2020-09-05 DIAGNOSIS — N186 End stage renal disease: Secondary | ICD-10-CM | POA: Diagnosis not present

## 2020-09-05 DIAGNOSIS — Z992 Dependence on renal dialysis: Secondary | ICD-10-CM | POA: Diagnosis not present

## 2020-09-06 DIAGNOSIS — Z992 Dependence on renal dialysis: Secondary | ICD-10-CM | POA: Diagnosis not present

## 2020-09-06 DIAGNOSIS — N186 End stage renal disease: Secondary | ICD-10-CM | POA: Diagnosis not present

## 2020-09-07 DIAGNOSIS — N186 End stage renal disease: Secondary | ICD-10-CM | POA: Diagnosis not present

## 2020-09-07 DIAGNOSIS — Z992 Dependence on renal dialysis: Secondary | ICD-10-CM | POA: Diagnosis not present

## 2020-09-08 DIAGNOSIS — Z992 Dependence on renal dialysis: Secondary | ICD-10-CM | POA: Diagnosis not present

## 2020-09-08 DIAGNOSIS — N186 End stage renal disease: Secondary | ICD-10-CM | POA: Diagnosis not present

## 2020-09-09 DIAGNOSIS — Z992 Dependence on renal dialysis: Secondary | ICD-10-CM | POA: Diagnosis not present

## 2020-09-09 DIAGNOSIS — N186 End stage renal disease: Secondary | ICD-10-CM | POA: Diagnosis not present

## 2020-09-10 DIAGNOSIS — N186 End stage renal disease: Secondary | ICD-10-CM | POA: Diagnosis not present

## 2020-09-10 DIAGNOSIS — Z992 Dependence on renal dialysis: Secondary | ICD-10-CM | POA: Diagnosis not present

## 2020-09-11 DIAGNOSIS — Z992 Dependence on renal dialysis: Secondary | ICD-10-CM | POA: Diagnosis not present

## 2020-09-11 DIAGNOSIS — N186 End stage renal disease: Secondary | ICD-10-CM | POA: Diagnosis not present

## 2020-09-12 DIAGNOSIS — N186 End stage renal disease: Secondary | ICD-10-CM | POA: Diagnosis not present

## 2020-09-12 DIAGNOSIS — Z992 Dependence on renal dialysis: Secondary | ICD-10-CM | POA: Diagnosis not present

## 2020-09-13 DIAGNOSIS — N186 End stage renal disease: Secondary | ICD-10-CM | POA: Diagnosis not present

## 2020-09-13 DIAGNOSIS — Z992 Dependence on renal dialysis: Secondary | ICD-10-CM | POA: Diagnosis not present

## 2020-09-14 DIAGNOSIS — N186 End stage renal disease: Secondary | ICD-10-CM | POA: Diagnosis not present

## 2020-09-14 DIAGNOSIS — Z992 Dependence on renal dialysis: Secondary | ICD-10-CM | POA: Diagnosis not present

## 2020-09-15 DIAGNOSIS — Z992 Dependence on renal dialysis: Secondary | ICD-10-CM | POA: Diagnosis not present

## 2020-09-15 DIAGNOSIS — N186 End stage renal disease: Secondary | ICD-10-CM | POA: Diagnosis not present

## 2020-09-16 DIAGNOSIS — N186 End stage renal disease: Secondary | ICD-10-CM | POA: Diagnosis not present

## 2020-09-16 DIAGNOSIS — Z992 Dependence on renal dialysis: Secondary | ICD-10-CM | POA: Diagnosis not present

## 2020-09-17 DIAGNOSIS — N186 End stage renal disease: Secondary | ICD-10-CM | POA: Diagnosis not present

## 2020-09-17 DIAGNOSIS — Z992 Dependence on renal dialysis: Secondary | ICD-10-CM | POA: Diagnosis not present

## 2020-09-18 DIAGNOSIS — Z992 Dependence on renal dialysis: Secondary | ICD-10-CM | POA: Diagnosis not present

## 2020-09-18 DIAGNOSIS — N186 End stage renal disease: Secondary | ICD-10-CM | POA: Diagnosis not present

## 2020-09-19 DIAGNOSIS — Z992 Dependence on renal dialysis: Secondary | ICD-10-CM | POA: Diagnosis not present

## 2020-09-19 DIAGNOSIS — N186 End stage renal disease: Secondary | ICD-10-CM | POA: Diagnosis not present

## 2020-09-20 DIAGNOSIS — Z992 Dependence on renal dialysis: Secondary | ICD-10-CM | POA: Diagnosis not present

## 2020-09-20 DIAGNOSIS — N186 End stage renal disease: Secondary | ICD-10-CM | POA: Diagnosis not present

## 2020-09-21 DIAGNOSIS — N186 End stage renal disease: Secondary | ICD-10-CM | POA: Diagnosis not present

## 2020-09-21 DIAGNOSIS — Z992 Dependence on renal dialysis: Secondary | ICD-10-CM | POA: Diagnosis not present

## 2020-09-22 DIAGNOSIS — N186 End stage renal disease: Secondary | ICD-10-CM | POA: Diagnosis not present

## 2020-09-22 DIAGNOSIS — Z992 Dependence on renal dialysis: Secondary | ICD-10-CM | POA: Diagnosis not present

## 2020-09-23 DIAGNOSIS — Z992 Dependence on renal dialysis: Secondary | ICD-10-CM | POA: Diagnosis not present

## 2020-09-23 DIAGNOSIS — N186 End stage renal disease: Secondary | ICD-10-CM | POA: Diagnosis not present

## 2020-09-24 DIAGNOSIS — N186 End stage renal disease: Secondary | ICD-10-CM | POA: Diagnosis not present

## 2020-09-24 DIAGNOSIS — Z992 Dependence on renal dialysis: Secondary | ICD-10-CM | POA: Diagnosis not present

## 2020-09-25 DIAGNOSIS — Z992 Dependence on renal dialysis: Secondary | ICD-10-CM | POA: Diagnosis not present

## 2020-09-25 DIAGNOSIS — N186 End stage renal disease: Secondary | ICD-10-CM | POA: Diagnosis not present

## 2020-09-26 DIAGNOSIS — N186 End stage renal disease: Secondary | ICD-10-CM | POA: Diagnosis not present

## 2020-09-26 DIAGNOSIS — Z992 Dependence on renal dialysis: Secondary | ICD-10-CM | POA: Diagnosis not present

## 2020-09-27 DIAGNOSIS — Z992 Dependence on renal dialysis: Secondary | ICD-10-CM | POA: Diagnosis not present

## 2020-09-27 DIAGNOSIS — N186 End stage renal disease: Secondary | ICD-10-CM | POA: Diagnosis not present

## 2020-09-28 DIAGNOSIS — Z992 Dependence on renal dialysis: Secondary | ICD-10-CM | POA: Diagnosis not present

## 2020-09-28 DIAGNOSIS — N186 End stage renal disease: Secondary | ICD-10-CM | POA: Diagnosis not present

## 2020-09-29 DIAGNOSIS — Z992 Dependence on renal dialysis: Secondary | ICD-10-CM | POA: Diagnosis not present

## 2020-09-29 DIAGNOSIS — N186 End stage renal disease: Secondary | ICD-10-CM | POA: Diagnosis not present

## 2020-09-30 DIAGNOSIS — Z992 Dependence on renal dialysis: Secondary | ICD-10-CM | POA: Diagnosis not present

## 2020-09-30 DIAGNOSIS — N186 End stage renal disease: Secondary | ICD-10-CM | POA: Diagnosis not present

## 2020-10-01 DIAGNOSIS — N186 End stage renal disease: Secondary | ICD-10-CM | POA: Diagnosis not present

## 2020-10-01 DIAGNOSIS — Z992 Dependence on renal dialysis: Secondary | ICD-10-CM | POA: Diagnosis not present

## 2020-10-02 DIAGNOSIS — N186 End stage renal disease: Secondary | ICD-10-CM | POA: Diagnosis not present

## 2020-10-02 DIAGNOSIS — Z1211 Encounter for screening for malignant neoplasm of colon: Secondary | ICD-10-CM | POA: Diagnosis not present

## 2020-10-02 DIAGNOSIS — Z992 Dependence on renal dialysis: Secondary | ICD-10-CM | POA: Diagnosis not present

## 2020-10-03 DIAGNOSIS — N186 End stage renal disease: Secondary | ICD-10-CM | POA: Diagnosis not present

## 2020-10-03 DIAGNOSIS — Z992 Dependence on renal dialysis: Secondary | ICD-10-CM | POA: Diagnosis not present

## 2020-10-05 DIAGNOSIS — Z992 Dependence on renal dialysis: Secondary | ICD-10-CM | POA: Diagnosis not present

## 2020-10-05 DIAGNOSIS — N186 End stage renal disease: Secondary | ICD-10-CM | POA: Diagnosis not present

## 2020-10-06 DIAGNOSIS — N186 End stage renal disease: Secondary | ICD-10-CM | POA: Diagnosis not present

## 2020-10-06 DIAGNOSIS — Z992 Dependence on renal dialysis: Secondary | ICD-10-CM | POA: Diagnosis not present

## 2020-10-07 DIAGNOSIS — Z992 Dependence on renal dialysis: Secondary | ICD-10-CM | POA: Diagnosis not present

## 2020-10-07 DIAGNOSIS — N186 End stage renal disease: Secondary | ICD-10-CM | POA: Diagnosis not present

## 2020-10-08 DIAGNOSIS — N186 End stage renal disease: Secondary | ICD-10-CM | POA: Diagnosis not present

## 2020-10-08 DIAGNOSIS — D509 Iron deficiency anemia, unspecified: Secondary | ICD-10-CM | POA: Diagnosis not present

## 2020-10-08 DIAGNOSIS — Z992 Dependence on renal dialysis: Secondary | ICD-10-CM | POA: Diagnosis not present

## 2020-10-09 DIAGNOSIS — Z992 Dependence on renal dialysis: Secondary | ICD-10-CM | POA: Diagnosis not present

## 2020-10-09 DIAGNOSIS — N186 End stage renal disease: Secondary | ICD-10-CM | POA: Diagnosis not present

## 2020-10-10 DIAGNOSIS — Z992 Dependence on renal dialysis: Secondary | ICD-10-CM | POA: Diagnosis not present

## 2020-10-10 DIAGNOSIS — N186 End stage renal disease: Secondary | ICD-10-CM | POA: Diagnosis not present

## 2020-10-11 DIAGNOSIS — N186 End stage renal disease: Secondary | ICD-10-CM | POA: Diagnosis not present

## 2020-10-11 DIAGNOSIS — Z992 Dependence on renal dialysis: Secondary | ICD-10-CM | POA: Diagnosis not present

## 2020-10-12 DIAGNOSIS — N186 End stage renal disease: Secondary | ICD-10-CM | POA: Diagnosis not present

## 2020-10-12 DIAGNOSIS — Z992 Dependence on renal dialysis: Secondary | ICD-10-CM | POA: Diagnosis not present

## 2020-10-13 DIAGNOSIS — N186 End stage renal disease: Secondary | ICD-10-CM | POA: Diagnosis not present

## 2020-10-13 DIAGNOSIS — Z992 Dependence on renal dialysis: Secondary | ICD-10-CM | POA: Diagnosis not present

## 2020-10-14 DIAGNOSIS — N186 End stage renal disease: Secondary | ICD-10-CM | POA: Diagnosis not present

## 2020-10-14 DIAGNOSIS — Z992 Dependence on renal dialysis: Secondary | ICD-10-CM | POA: Diagnosis not present

## 2020-10-15 DIAGNOSIS — N186 End stage renal disease: Secondary | ICD-10-CM | POA: Diagnosis not present

## 2020-10-15 DIAGNOSIS — Z992 Dependence on renal dialysis: Secondary | ICD-10-CM | POA: Diagnosis not present

## 2020-10-16 DIAGNOSIS — Z992 Dependence on renal dialysis: Secondary | ICD-10-CM | POA: Diagnosis not present

## 2020-10-16 DIAGNOSIS — N186 End stage renal disease: Secondary | ICD-10-CM | POA: Diagnosis not present

## 2020-10-17 DIAGNOSIS — N186 End stage renal disease: Secondary | ICD-10-CM | POA: Diagnosis not present

## 2020-10-17 DIAGNOSIS — Z992 Dependence on renal dialysis: Secondary | ICD-10-CM | POA: Diagnosis not present

## 2020-10-18 DIAGNOSIS — N186 End stage renal disease: Secondary | ICD-10-CM | POA: Diagnosis not present

## 2020-10-18 DIAGNOSIS — Z992 Dependence on renal dialysis: Secondary | ICD-10-CM | POA: Diagnosis not present

## 2020-10-19 DIAGNOSIS — N186 End stage renal disease: Secondary | ICD-10-CM | POA: Diagnosis not present

## 2020-10-19 DIAGNOSIS — Z992 Dependence on renal dialysis: Secondary | ICD-10-CM | POA: Diagnosis not present

## 2020-10-20 DIAGNOSIS — N186 End stage renal disease: Secondary | ICD-10-CM | POA: Diagnosis not present

## 2020-10-20 DIAGNOSIS — Z992 Dependence on renal dialysis: Secondary | ICD-10-CM | POA: Diagnosis not present

## 2020-10-21 DIAGNOSIS — N186 End stage renal disease: Secondary | ICD-10-CM | POA: Diagnosis not present

## 2020-10-21 DIAGNOSIS — Z992 Dependence on renal dialysis: Secondary | ICD-10-CM | POA: Diagnosis not present

## 2020-10-22 DIAGNOSIS — Z992 Dependence on renal dialysis: Secondary | ICD-10-CM | POA: Diagnosis not present

## 2020-10-22 DIAGNOSIS — N186 End stage renal disease: Secondary | ICD-10-CM | POA: Diagnosis not present

## 2020-10-23 DIAGNOSIS — Z992 Dependence on renal dialysis: Secondary | ICD-10-CM | POA: Diagnosis not present

## 2020-10-23 DIAGNOSIS — N186 End stage renal disease: Secondary | ICD-10-CM | POA: Diagnosis not present

## 2020-10-24 DIAGNOSIS — N186 End stage renal disease: Secondary | ICD-10-CM | POA: Diagnosis not present

## 2020-10-24 DIAGNOSIS — Z992 Dependence on renal dialysis: Secondary | ICD-10-CM | POA: Diagnosis not present

## 2020-10-25 ENCOUNTER — Other Ambulatory Visit: Payer: Self-pay | Admitting: Family Medicine

## 2020-10-25 DIAGNOSIS — Z1231 Encounter for screening mammogram for malignant neoplasm of breast: Secondary | ICD-10-CM

## 2020-10-25 DIAGNOSIS — N186 End stage renal disease: Secondary | ICD-10-CM | POA: Diagnosis not present

## 2020-10-25 DIAGNOSIS — Z992 Dependence on renal dialysis: Secondary | ICD-10-CM | POA: Diagnosis not present

## 2020-10-26 DIAGNOSIS — Z992 Dependence on renal dialysis: Secondary | ICD-10-CM | POA: Diagnosis not present

## 2020-10-26 DIAGNOSIS — N186 End stage renal disease: Secondary | ICD-10-CM | POA: Diagnosis not present

## 2020-10-27 DIAGNOSIS — Z992 Dependence on renal dialysis: Secondary | ICD-10-CM | POA: Diagnosis not present

## 2020-10-27 DIAGNOSIS — N186 End stage renal disease: Secondary | ICD-10-CM | POA: Diagnosis not present

## 2020-10-28 DIAGNOSIS — N186 End stage renal disease: Secondary | ICD-10-CM | POA: Diagnosis not present

## 2020-10-28 DIAGNOSIS — Z992 Dependence on renal dialysis: Secondary | ICD-10-CM | POA: Diagnosis not present

## 2020-10-29 DIAGNOSIS — Z992 Dependence on renal dialysis: Secondary | ICD-10-CM | POA: Diagnosis not present

## 2020-10-29 DIAGNOSIS — N186 End stage renal disease: Secondary | ICD-10-CM | POA: Diagnosis not present

## 2020-10-30 ENCOUNTER — Ambulatory Visit
Admission: RE | Admit: 2020-10-30 | Discharge: 2020-10-30 | Disposition: A | Discharge status: Home / Self Care | Payer: Medicare Other | Source: Ambulatory Visit | Attending: Family Medicine | Admitting: Family Medicine

## 2020-10-30 ENCOUNTER — Other Ambulatory Visit: Payer: Self-pay

## 2020-10-30 DIAGNOSIS — Z992 Dependence on renal dialysis: Secondary | ICD-10-CM | POA: Diagnosis not present

## 2020-10-30 DIAGNOSIS — N186 End stage renal disease: Secondary | ICD-10-CM | POA: Diagnosis not present

## 2020-10-30 DIAGNOSIS — Z780 Asymptomatic menopausal state: Secondary | ICD-10-CM | POA: Diagnosis not present

## 2020-10-30 DIAGNOSIS — E2839 Other primary ovarian failure: Secondary | ICD-10-CM

## 2020-10-30 DIAGNOSIS — Z13820 Encounter for screening for osteoporosis: Secondary | ICD-10-CM | POA: Diagnosis not present

## 2020-10-31 DIAGNOSIS — Z992 Dependence on renal dialysis: Secondary | ICD-10-CM | POA: Diagnosis not present

## 2020-10-31 DIAGNOSIS — N186 End stage renal disease: Secondary | ICD-10-CM | POA: Diagnosis not present

## 2020-11-01 DIAGNOSIS — Z992 Dependence on renal dialysis: Secondary | ICD-10-CM | POA: Diagnosis not present

## 2020-11-01 DIAGNOSIS — N186 End stage renal disease: Secondary | ICD-10-CM | POA: Diagnosis not present

## 2020-11-02 DIAGNOSIS — N186 End stage renal disease: Secondary | ICD-10-CM | POA: Diagnosis not present

## 2020-11-02 DIAGNOSIS — Z992 Dependence on renal dialysis: Secondary | ICD-10-CM | POA: Diagnosis not present

## 2020-11-03 DIAGNOSIS — Z992 Dependence on renal dialysis: Secondary | ICD-10-CM | POA: Diagnosis not present

## 2020-11-03 DIAGNOSIS — N186 End stage renal disease: Secondary | ICD-10-CM | POA: Diagnosis not present

## 2020-11-04 DIAGNOSIS — N186 End stage renal disease: Secondary | ICD-10-CM | POA: Diagnosis not present

## 2020-11-04 DIAGNOSIS — Z992 Dependence on renal dialysis: Secondary | ICD-10-CM | POA: Diagnosis not present

## 2020-11-05 DIAGNOSIS — Z992 Dependence on renal dialysis: Secondary | ICD-10-CM | POA: Diagnosis not present

## 2020-11-05 DIAGNOSIS — N186 End stage renal disease: Secondary | ICD-10-CM | POA: Diagnosis not present

## 2020-11-06 DIAGNOSIS — D509 Iron deficiency anemia, unspecified: Secondary | ICD-10-CM | POA: Diagnosis not present

## 2020-11-06 DIAGNOSIS — Z992 Dependence on renal dialysis: Secondary | ICD-10-CM | POA: Diagnosis not present

## 2020-11-06 DIAGNOSIS — N186 End stage renal disease: Secondary | ICD-10-CM | POA: Diagnosis not present

## 2020-11-07 DIAGNOSIS — Z992 Dependence on renal dialysis: Secondary | ICD-10-CM | POA: Diagnosis not present

## 2020-11-07 DIAGNOSIS — N186 End stage renal disease: Secondary | ICD-10-CM | POA: Diagnosis not present

## 2020-11-08 DIAGNOSIS — Z992 Dependence on renal dialysis: Secondary | ICD-10-CM | POA: Diagnosis not present

## 2020-11-08 DIAGNOSIS — N186 End stage renal disease: Secondary | ICD-10-CM | POA: Diagnosis not present

## 2020-11-09 DIAGNOSIS — N186 End stage renal disease: Secondary | ICD-10-CM | POA: Diagnosis not present

## 2020-11-09 DIAGNOSIS — Z992 Dependence on renal dialysis: Secondary | ICD-10-CM | POA: Diagnosis not present

## 2020-11-10 DIAGNOSIS — N186 End stage renal disease: Secondary | ICD-10-CM | POA: Diagnosis not present

## 2020-11-10 DIAGNOSIS — Z992 Dependence on renal dialysis: Secondary | ICD-10-CM | POA: Diagnosis not present

## 2020-11-11 DIAGNOSIS — Z992 Dependence on renal dialysis: Secondary | ICD-10-CM | POA: Diagnosis not present

## 2020-11-11 DIAGNOSIS — N186 End stage renal disease: Secondary | ICD-10-CM | POA: Diagnosis not present

## 2020-11-12 DIAGNOSIS — N186 End stage renal disease: Secondary | ICD-10-CM | POA: Diagnosis not present

## 2020-11-12 DIAGNOSIS — Z992 Dependence on renal dialysis: Secondary | ICD-10-CM | POA: Diagnosis not present

## 2020-11-13 DIAGNOSIS — N186 End stage renal disease: Secondary | ICD-10-CM | POA: Diagnosis not present

## 2020-11-13 DIAGNOSIS — Z992 Dependence on renal dialysis: Secondary | ICD-10-CM | POA: Diagnosis not present

## 2020-11-14 DIAGNOSIS — Z992 Dependence on renal dialysis: Secondary | ICD-10-CM | POA: Diagnosis not present

## 2020-11-14 DIAGNOSIS — N186 End stage renal disease: Secondary | ICD-10-CM | POA: Diagnosis not present

## 2020-11-15 DIAGNOSIS — N186 End stage renal disease: Secondary | ICD-10-CM | POA: Diagnosis not present

## 2020-11-15 DIAGNOSIS — Z992 Dependence on renal dialysis: Secondary | ICD-10-CM | POA: Diagnosis not present

## 2020-11-16 DIAGNOSIS — N186 End stage renal disease: Secondary | ICD-10-CM | POA: Diagnosis not present

## 2020-11-16 DIAGNOSIS — Z992 Dependence on renal dialysis: Secondary | ICD-10-CM | POA: Diagnosis not present

## 2020-11-17 DIAGNOSIS — Z992 Dependence on renal dialysis: Secondary | ICD-10-CM | POA: Diagnosis not present

## 2020-11-17 DIAGNOSIS — N186 End stage renal disease: Secondary | ICD-10-CM | POA: Diagnosis not present

## 2020-11-18 DIAGNOSIS — N186 End stage renal disease: Secondary | ICD-10-CM | POA: Diagnosis not present

## 2020-11-18 DIAGNOSIS — Z992 Dependence on renal dialysis: Secondary | ICD-10-CM | POA: Diagnosis not present

## 2020-11-19 DIAGNOSIS — N186 End stage renal disease: Secondary | ICD-10-CM | POA: Diagnosis not present

## 2020-11-19 DIAGNOSIS — Z992 Dependence on renal dialysis: Secondary | ICD-10-CM | POA: Diagnosis not present

## 2020-11-20 DIAGNOSIS — Z992 Dependence on renal dialysis: Secondary | ICD-10-CM | POA: Diagnosis not present

## 2020-11-20 DIAGNOSIS — N186 End stage renal disease: Secondary | ICD-10-CM | POA: Diagnosis not present

## 2020-11-21 DIAGNOSIS — N186 End stage renal disease: Secondary | ICD-10-CM | POA: Diagnosis not present

## 2020-11-21 DIAGNOSIS — Z992 Dependence on renal dialysis: Secondary | ICD-10-CM | POA: Diagnosis not present

## 2020-11-22 DIAGNOSIS — Z992 Dependence on renal dialysis: Secondary | ICD-10-CM | POA: Diagnosis not present

## 2020-11-22 DIAGNOSIS — N186 End stage renal disease: Secondary | ICD-10-CM | POA: Diagnosis not present

## 2020-11-23 DIAGNOSIS — N186 End stage renal disease: Secondary | ICD-10-CM | POA: Diagnosis not present

## 2020-11-23 DIAGNOSIS — Z992 Dependence on renal dialysis: Secondary | ICD-10-CM | POA: Diagnosis not present

## 2020-11-24 DIAGNOSIS — N186 End stage renal disease: Secondary | ICD-10-CM | POA: Diagnosis not present

## 2020-11-24 DIAGNOSIS — Z992 Dependence on renal dialysis: Secondary | ICD-10-CM | POA: Diagnosis not present

## 2020-11-25 DIAGNOSIS — Z992 Dependence on renal dialysis: Secondary | ICD-10-CM | POA: Diagnosis not present

## 2020-11-25 DIAGNOSIS — N186 End stage renal disease: Secondary | ICD-10-CM | POA: Diagnosis not present

## 2020-11-26 DIAGNOSIS — N186 End stage renal disease: Secondary | ICD-10-CM | POA: Diagnosis not present

## 2020-11-26 DIAGNOSIS — Z992 Dependence on renal dialysis: Secondary | ICD-10-CM | POA: Diagnosis not present

## 2020-11-27 DIAGNOSIS — Z992 Dependence on renal dialysis: Secondary | ICD-10-CM | POA: Diagnosis not present

## 2020-11-27 DIAGNOSIS — N186 End stage renal disease: Secondary | ICD-10-CM | POA: Diagnosis not present

## 2020-11-28 DIAGNOSIS — Z992 Dependence on renal dialysis: Secondary | ICD-10-CM | POA: Diagnosis not present

## 2020-11-28 DIAGNOSIS — N186 End stage renal disease: Secondary | ICD-10-CM | POA: Diagnosis not present

## 2020-11-29 DIAGNOSIS — N186 End stage renal disease: Secondary | ICD-10-CM | POA: Diagnosis not present

## 2020-11-29 DIAGNOSIS — Z992 Dependence on renal dialysis: Secondary | ICD-10-CM | POA: Diagnosis not present

## 2020-11-30 DIAGNOSIS — Z6822 Body mass index (BMI) 22.0-22.9, adult: Secondary | ICD-10-CM | POA: Diagnosis not present

## 2020-11-30 DIAGNOSIS — M7022 Olecranon bursitis, left elbow: Secondary | ICD-10-CM | POA: Diagnosis not present

## 2020-11-30 DIAGNOSIS — M3214 Glomerular disease in systemic lupus erythematosus: Secondary | ICD-10-CM | POA: Diagnosis not present

## 2020-11-30 DIAGNOSIS — Z992 Dependence on renal dialysis: Secondary | ICD-10-CM | POA: Diagnosis not present

## 2020-11-30 DIAGNOSIS — M329 Systemic lupus erythematosus, unspecified: Secondary | ICD-10-CM | POA: Diagnosis not present

## 2020-11-30 DIAGNOSIS — N186 End stage renal disease: Secondary | ICD-10-CM | POA: Diagnosis not present

## 2020-12-01 DIAGNOSIS — Z992 Dependence on renal dialysis: Secondary | ICD-10-CM | POA: Diagnosis not present

## 2020-12-01 DIAGNOSIS — N186 End stage renal disease: Secondary | ICD-10-CM | POA: Diagnosis not present

## 2020-12-02 DIAGNOSIS — N186 End stage renal disease: Secondary | ICD-10-CM | POA: Diagnosis not present

## 2020-12-02 DIAGNOSIS — Z992 Dependence on renal dialysis: Secondary | ICD-10-CM | POA: Diagnosis not present

## 2020-12-03 DIAGNOSIS — N186 End stage renal disease: Secondary | ICD-10-CM | POA: Diagnosis not present

## 2020-12-03 DIAGNOSIS — Z992 Dependence on renal dialysis: Secondary | ICD-10-CM | POA: Diagnosis not present

## 2020-12-04 DIAGNOSIS — N186 End stage renal disease: Secondary | ICD-10-CM | POA: Diagnosis not present

## 2020-12-04 DIAGNOSIS — Z992 Dependence on renal dialysis: Secondary | ICD-10-CM | POA: Diagnosis not present

## 2020-12-05 DIAGNOSIS — N186 End stage renal disease: Secondary | ICD-10-CM | POA: Diagnosis not present

## 2020-12-05 DIAGNOSIS — Z992 Dependence on renal dialysis: Secondary | ICD-10-CM | POA: Diagnosis not present

## 2020-12-05 DIAGNOSIS — D509 Iron deficiency anemia, unspecified: Secondary | ICD-10-CM | POA: Diagnosis not present

## 2020-12-06 DIAGNOSIS — Z992 Dependence on renal dialysis: Secondary | ICD-10-CM | POA: Diagnosis not present

## 2020-12-06 DIAGNOSIS — N186 End stage renal disease: Secondary | ICD-10-CM | POA: Diagnosis not present

## 2020-12-07 DIAGNOSIS — N186 End stage renal disease: Secondary | ICD-10-CM | POA: Diagnosis not present

## 2020-12-07 DIAGNOSIS — Z992 Dependence on renal dialysis: Secondary | ICD-10-CM | POA: Diagnosis not present

## 2020-12-08 DIAGNOSIS — Z992 Dependence on renal dialysis: Secondary | ICD-10-CM | POA: Diagnosis not present

## 2020-12-08 DIAGNOSIS — N186 End stage renal disease: Secondary | ICD-10-CM | POA: Diagnosis not present

## 2020-12-09 DIAGNOSIS — Z992 Dependence on renal dialysis: Secondary | ICD-10-CM | POA: Diagnosis not present

## 2020-12-09 DIAGNOSIS — N186 End stage renal disease: Secondary | ICD-10-CM | POA: Diagnosis not present

## 2020-12-10 DIAGNOSIS — N186 End stage renal disease: Secondary | ICD-10-CM | POA: Diagnosis not present

## 2020-12-10 DIAGNOSIS — Z992 Dependence on renal dialysis: Secondary | ICD-10-CM | POA: Diagnosis not present

## 2020-12-11 DIAGNOSIS — Z992 Dependence on renal dialysis: Secondary | ICD-10-CM | POA: Diagnosis not present

## 2020-12-11 DIAGNOSIS — N186 End stage renal disease: Secondary | ICD-10-CM | POA: Diagnosis not present

## 2020-12-12 DIAGNOSIS — N186 End stage renal disease: Secondary | ICD-10-CM | POA: Diagnosis not present

## 2020-12-12 DIAGNOSIS — Z992 Dependence on renal dialysis: Secondary | ICD-10-CM | POA: Diagnosis not present

## 2020-12-13 DIAGNOSIS — Z992 Dependence on renal dialysis: Secondary | ICD-10-CM | POA: Diagnosis not present

## 2020-12-13 DIAGNOSIS — N186 End stage renal disease: Secondary | ICD-10-CM | POA: Diagnosis not present

## 2020-12-14 DIAGNOSIS — N186 End stage renal disease: Secondary | ICD-10-CM | POA: Diagnosis not present

## 2020-12-14 DIAGNOSIS — Z992 Dependence on renal dialysis: Secondary | ICD-10-CM | POA: Diagnosis not present

## 2020-12-15 DIAGNOSIS — Z992 Dependence on renal dialysis: Secondary | ICD-10-CM | POA: Diagnosis not present

## 2020-12-15 DIAGNOSIS — N186 End stage renal disease: Secondary | ICD-10-CM | POA: Diagnosis not present

## 2020-12-16 DIAGNOSIS — Z992 Dependence on renal dialysis: Secondary | ICD-10-CM | POA: Diagnosis not present

## 2020-12-16 DIAGNOSIS — N186 End stage renal disease: Secondary | ICD-10-CM | POA: Diagnosis not present

## 2020-12-17 DIAGNOSIS — N186 End stage renal disease: Secondary | ICD-10-CM | POA: Diagnosis not present

## 2020-12-17 DIAGNOSIS — Z992 Dependence on renal dialysis: Secondary | ICD-10-CM | POA: Diagnosis not present

## 2020-12-18 DIAGNOSIS — N186 End stage renal disease: Secondary | ICD-10-CM | POA: Diagnosis not present

## 2020-12-18 DIAGNOSIS — Z992 Dependence on renal dialysis: Secondary | ICD-10-CM | POA: Diagnosis not present

## 2020-12-19 DIAGNOSIS — Z992 Dependence on renal dialysis: Secondary | ICD-10-CM | POA: Diagnosis not present

## 2020-12-19 DIAGNOSIS — N186 End stage renal disease: Secondary | ICD-10-CM | POA: Diagnosis not present

## 2020-12-20 DIAGNOSIS — Z992 Dependence on renal dialysis: Secondary | ICD-10-CM | POA: Diagnosis not present

## 2020-12-20 DIAGNOSIS — N186 End stage renal disease: Secondary | ICD-10-CM | POA: Diagnosis not present

## 2020-12-21 DIAGNOSIS — Z992 Dependence on renal dialysis: Secondary | ICD-10-CM | POA: Diagnosis not present

## 2020-12-21 DIAGNOSIS — N186 End stage renal disease: Secondary | ICD-10-CM | POA: Diagnosis not present

## 2020-12-22 DIAGNOSIS — Z992 Dependence on renal dialysis: Secondary | ICD-10-CM | POA: Diagnosis not present

## 2020-12-22 DIAGNOSIS — N186 End stage renal disease: Secondary | ICD-10-CM | POA: Diagnosis not present

## 2020-12-23 DIAGNOSIS — Z992 Dependence on renal dialysis: Secondary | ICD-10-CM | POA: Diagnosis not present

## 2020-12-23 DIAGNOSIS — N186 End stage renal disease: Secondary | ICD-10-CM | POA: Diagnosis not present

## 2020-12-24 DIAGNOSIS — Z992 Dependence on renal dialysis: Secondary | ICD-10-CM | POA: Diagnosis not present

## 2020-12-24 DIAGNOSIS — N186 End stage renal disease: Secondary | ICD-10-CM | POA: Diagnosis not present

## 2020-12-25 DIAGNOSIS — N186 End stage renal disease: Secondary | ICD-10-CM | POA: Diagnosis not present

## 2020-12-25 DIAGNOSIS — Z992 Dependence on renal dialysis: Secondary | ICD-10-CM | POA: Diagnosis not present

## 2020-12-26 DIAGNOSIS — Z992 Dependence on renal dialysis: Secondary | ICD-10-CM | POA: Diagnosis not present

## 2020-12-26 DIAGNOSIS — N186 End stage renal disease: Secondary | ICD-10-CM | POA: Diagnosis not present

## 2020-12-27 DIAGNOSIS — Z992 Dependence on renal dialysis: Secondary | ICD-10-CM | POA: Diagnosis not present

## 2020-12-27 DIAGNOSIS — N186 End stage renal disease: Secondary | ICD-10-CM | POA: Diagnosis not present

## 2020-12-28 DIAGNOSIS — Z992 Dependence on renal dialysis: Secondary | ICD-10-CM | POA: Diagnosis not present

## 2020-12-28 DIAGNOSIS — N186 End stage renal disease: Secondary | ICD-10-CM | POA: Diagnosis not present

## 2020-12-29 DIAGNOSIS — Z992 Dependence on renal dialysis: Secondary | ICD-10-CM | POA: Diagnosis not present

## 2020-12-29 DIAGNOSIS — N186 End stage renal disease: Secondary | ICD-10-CM | POA: Diagnosis not present

## 2020-12-30 DIAGNOSIS — Z992 Dependence on renal dialysis: Secondary | ICD-10-CM | POA: Diagnosis not present

## 2020-12-30 DIAGNOSIS — N186 End stage renal disease: Secondary | ICD-10-CM | POA: Diagnosis not present

## 2020-12-31 DIAGNOSIS — N186 End stage renal disease: Secondary | ICD-10-CM | POA: Diagnosis not present

## 2020-12-31 DIAGNOSIS — Z992 Dependence on renal dialysis: Secondary | ICD-10-CM | POA: Diagnosis not present

## 2021-01-01 DIAGNOSIS — Z992 Dependence on renal dialysis: Secondary | ICD-10-CM | POA: Diagnosis not present

## 2021-01-01 DIAGNOSIS — N186 End stage renal disease: Secondary | ICD-10-CM | POA: Diagnosis not present

## 2021-01-02 DIAGNOSIS — N186 End stage renal disease: Secondary | ICD-10-CM | POA: Diagnosis not present

## 2021-01-02 DIAGNOSIS — Z992 Dependence on renal dialysis: Secondary | ICD-10-CM | POA: Diagnosis not present

## 2021-01-02 DIAGNOSIS — D509 Iron deficiency anemia, unspecified: Secondary | ICD-10-CM | POA: Diagnosis not present

## 2021-01-03 DIAGNOSIS — N186 End stage renal disease: Secondary | ICD-10-CM | POA: Diagnosis not present

## 2021-01-03 DIAGNOSIS — Z992 Dependence on renal dialysis: Secondary | ICD-10-CM | POA: Diagnosis not present

## 2021-01-04 DIAGNOSIS — N186 End stage renal disease: Secondary | ICD-10-CM | POA: Diagnosis not present

## 2021-01-04 DIAGNOSIS — Z992 Dependence on renal dialysis: Secondary | ICD-10-CM | POA: Diagnosis not present

## 2021-01-05 DIAGNOSIS — N186 End stage renal disease: Secondary | ICD-10-CM | POA: Diagnosis not present

## 2021-01-05 DIAGNOSIS — Z992 Dependence on renal dialysis: Secondary | ICD-10-CM | POA: Diagnosis not present

## 2021-01-06 DIAGNOSIS — Z992 Dependence on renal dialysis: Secondary | ICD-10-CM | POA: Diagnosis not present

## 2021-01-06 DIAGNOSIS — N186 End stage renal disease: Secondary | ICD-10-CM | POA: Diagnosis not present

## 2021-01-07 DIAGNOSIS — Z992 Dependence on renal dialysis: Secondary | ICD-10-CM | POA: Diagnosis not present

## 2021-01-07 DIAGNOSIS — N186 End stage renal disease: Secondary | ICD-10-CM | POA: Diagnosis not present

## 2021-01-08 DIAGNOSIS — N186 End stage renal disease: Secondary | ICD-10-CM | POA: Diagnosis not present

## 2021-01-08 DIAGNOSIS — Z992 Dependence on renal dialysis: Secondary | ICD-10-CM | POA: Diagnosis not present

## 2021-01-09 DIAGNOSIS — Z992 Dependence on renal dialysis: Secondary | ICD-10-CM | POA: Diagnosis not present

## 2021-01-09 DIAGNOSIS — N186 End stage renal disease: Secondary | ICD-10-CM | POA: Diagnosis not present

## 2021-01-10 DIAGNOSIS — Z992 Dependence on renal dialysis: Secondary | ICD-10-CM | POA: Diagnosis not present

## 2021-01-10 DIAGNOSIS — N186 End stage renal disease: Secondary | ICD-10-CM | POA: Diagnosis not present

## 2021-01-11 DIAGNOSIS — Z992 Dependence on renal dialysis: Secondary | ICD-10-CM | POA: Diagnosis not present

## 2021-01-11 DIAGNOSIS — N186 End stage renal disease: Secondary | ICD-10-CM | POA: Diagnosis not present

## 2021-01-12 DIAGNOSIS — Z992 Dependence on renal dialysis: Secondary | ICD-10-CM | POA: Diagnosis not present

## 2021-01-12 DIAGNOSIS — N186 End stage renal disease: Secondary | ICD-10-CM | POA: Diagnosis not present

## 2021-01-13 DIAGNOSIS — N186 End stage renal disease: Secondary | ICD-10-CM | POA: Diagnosis not present

## 2021-01-13 DIAGNOSIS — Z992 Dependence on renal dialysis: Secondary | ICD-10-CM | POA: Diagnosis not present

## 2021-01-14 DIAGNOSIS — Z992 Dependence on renal dialysis: Secondary | ICD-10-CM | POA: Diagnosis not present

## 2021-01-14 DIAGNOSIS — N186 End stage renal disease: Secondary | ICD-10-CM | POA: Diagnosis not present

## 2021-01-15 DIAGNOSIS — H9311 Tinnitus, right ear: Secondary | ICD-10-CM | POA: Diagnosis not present

## 2021-01-15 DIAGNOSIS — M3214 Glomerular disease in systemic lupus erythematosus: Secondary | ICD-10-CM | POA: Diagnosis not present

## 2021-01-15 DIAGNOSIS — M329 Systemic lupus erythematosus, unspecified: Secondary | ICD-10-CM | POA: Diagnosis not present

## 2021-01-15 DIAGNOSIS — Z992 Dependence on renal dialysis: Secondary | ICD-10-CM | POA: Diagnosis not present

## 2021-01-15 DIAGNOSIS — I151 Hypertension secondary to other renal disorders: Secondary | ICD-10-CM | POA: Diagnosis not present

## 2021-01-15 DIAGNOSIS — N186 End stage renal disease: Secondary | ICD-10-CM | POA: Diagnosis not present

## 2021-01-16 DIAGNOSIS — Z992 Dependence on renal dialysis: Secondary | ICD-10-CM | POA: Diagnosis not present

## 2021-01-16 DIAGNOSIS — N186 End stage renal disease: Secondary | ICD-10-CM | POA: Diagnosis not present

## 2021-01-17 DIAGNOSIS — Z992 Dependence on renal dialysis: Secondary | ICD-10-CM | POA: Diagnosis not present

## 2021-01-17 DIAGNOSIS — N186 End stage renal disease: Secondary | ICD-10-CM | POA: Diagnosis not present

## 2021-01-18 DIAGNOSIS — N186 End stage renal disease: Secondary | ICD-10-CM | POA: Diagnosis not present

## 2021-01-18 DIAGNOSIS — Z992 Dependence on renal dialysis: Secondary | ICD-10-CM | POA: Diagnosis not present

## 2021-01-19 DIAGNOSIS — Z992 Dependence on renal dialysis: Secondary | ICD-10-CM | POA: Diagnosis not present

## 2021-01-19 DIAGNOSIS — N186 End stage renal disease: Secondary | ICD-10-CM | POA: Diagnosis not present

## 2021-01-20 DIAGNOSIS — Z992 Dependence on renal dialysis: Secondary | ICD-10-CM | POA: Diagnosis not present

## 2021-01-20 DIAGNOSIS — N186 End stage renal disease: Secondary | ICD-10-CM | POA: Diagnosis not present

## 2021-01-21 DIAGNOSIS — N186 End stage renal disease: Secondary | ICD-10-CM | POA: Diagnosis not present

## 2021-01-21 DIAGNOSIS — Z992 Dependence on renal dialysis: Secondary | ICD-10-CM | POA: Diagnosis not present

## 2021-01-22 DIAGNOSIS — N186 End stage renal disease: Secondary | ICD-10-CM | POA: Diagnosis not present

## 2021-01-22 DIAGNOSIS — M3210 Systemic lupus erythematosus, organ or system involvement unspecified: Secondary | ICD-10-CM | POA: Diagnosis not present

## 2021-01-22 DIAGNOSIS — Z79899 Other long term (current) drug therapy: Secondary | ICD-10-CM | POA: Diagnosis not present

## 2021-01-22 DIAGNOSIS — Z992 Dependence on renal dialysis: Secondary | ICD-10-CM | POA: Diagnosis not present

## 2021-01-22 DIAGNOSIS — H353131 Nonexudative age-related macular degeneration, bilateral, early dry stage: Secondary | ICD-10-CM | POA: Diagnosis not present

## 2021-01-23 DIAGNOSIS — N186 End stage renal disease: Secondary | ICD-10-CM | POA: Diagnosis not present

## 2021-01-23 DIAGNOSIS — Z992 Dependence on renal dialysis: Secondary | ICD-10-CM | POA: Diagnosis not present

## 2021-01-24 DIAGNOSIS — N186 End stage renal disease: Secondary | ICD-10-CM | POA: Diagnosis not present

## 2021-01-24 DIAGNOSIS — Z992 Dependence on renal dialysis: Secondary | ICD-10-CM | POA: Diagnosis not present

## 2021-01-25 DIAGNOSIS — Z992 Dependence on renal dialysis: Secondary | ICD-10-CM | POA: Diagnosis not present

## 2021-01-25 DIAGNOSIS — N186 End stage renal disease: Secondary | ICD-10-CM | POA: Diagnosis not present

## 2021-01-26 DIAGNOSIS — N186 End stage renal disease: Secondary | ICD-10-CM | POA: Diagnosis not present

## 2021-01-26 DIAGNOSIS — Z992 Dependence on renal dialysis: Secondary | ICD-10-CM | POA: Diagnosis not present

## 2021-01-27 DIAGNOSIS — N186 End stage renal disease: Secondary | ICD-10-CM | POA: Diagnosis not present

## 2021-01-27 DIAGNOSIS — Z992 Dependence on renal dialysis: Secondary | ICD-10-CM | POA: Diagnosis not present

## 2021-01-28 DIAGNOSIS — N186 End stage renal disease: Secondary | ICD-10-CM | POA: Diagnosis not present

## 2021-01-28 DIAGNOSIS — Z992 Dependence on renal dialysis: Secondary | ICD-10-CM | POA: Diagnosis not present

## 2021-01-29 DIAGNOSIS — N186 End stage renal disease: Secondary | ICD-10-CM | POA: Diagnosis not present

## 2021-01-29 DIAGNOSIS — Z992 Dependence on renal dialysis: Secondary | ICD-10-CM | POA: Diagnosis not present

## 2021-01-30 DIAGNOSIS — N186 End stage renal disease: Secondary | ICD-10-CM | POA: Diagnosis not present

## 2021-01-30 DIAGNOSIS — Z992 Dependence on renal dialysis: Secondary | ICD-10-CM | POA: Diagnosis not present

## 2021-01-31 DIAGNOSIS — N186 End stage renal disease: Secondary | ICD-10-CM | POA: Diagnosis not present

## 2021-01-31 DIAGNOSIS — Z992 Dependence on renal dialysis: Secondary | ICD-10-CM | POA: Diagnosis not present

## 2021-02-01 DIAGNOSIS — Z992 Dependence on renal dialysis: Secondary | ICD-10-CM | POA: Diagnosis not present

## 2021-02-01 DIAGNOSIS — N186 End stage renal disease: Secondary | ICD-10-CM | POA: Diagnosis not present

## 2021-02-02 DIAGNOSIS — Z992 Dependence on renal dialysis: Secondary | ICD-10-CM | POA: Diagnosis not present

## 2021-02-02 DIAGNOSIS — N186 End stage renal disease: Secondary | ICD-10-CM | POA: Diagnosis not present

## 2021-02-03 DIAGNOSIS — Z992 Dependence on renal dialysis: Secondary | ICD-10-CM | POA: Diagnosis not present

## 2021-02-03 DIAGNOSIS — N186 End stage renal disease: Secondary | ICD-10-CM | POA: Diagnosis not present

## 2021-02-04 ENCOUNTER — Ambulatory Visit (INDEPENDENT_AMBULATORY_CARE_PROVIDER_SITE_OTHER): Payer: Medicare Other | Admitting: Otolaryngology

## 2021-02-04 ENCOUNTER — Other Ambulatory Visit: Payer: Self-pay

## 2021-02-04 VITALS — Temp 97.5°F

## 2021-02-04 DIAGNOSIS — H9311 Tinnitus, right ear: Secondary | ICD-10-CM

## 2021-02-04 DIAGNOSIS — Z992 Dependence on renal dialysis: Secondary | ICD-10-CM | POA: Diagnosis not present

## 2021-02-04 DIAGNOSIS — N186 End stage renal disease: Secondary | ICD-10-CM | POA: Diagnosis not present

## 2021-02-04 NOTE — Progress Notes (Signed)
HPI: Sheena Smith is a 70 y.o. female who presents is referred by her PCP Dr. [name] for evaluation of tinnitus in the right ear that she has had since childhood.  She does not notice any hearing loss in 1 ear or the other ear.  Denies any loud noise exposure when she was a child.  She felt like she just woke up with this 1 day when she was about 11 or 12 and has lived with it since then.  She denies loud noise exposure.  Does not notice that much difficulty with her hearing..  Past Medical History:  Diagnosis Date  . Hypertension   . Lupus ([place])   . Renal failure    Past Surgical History:  Procedure Laterality Date  . FOOT SURGERY    . SMALL INTESTINE SURGERY     Social History   Socioeconomic History  . Marital status: Widowed    Spouse name: Not on file  . Number of children: Not on file  . Years of education: Not on file  . Highest education level: Not on file  Occupational History  . Not on file  Tobacco Use  . Smoking status: Former Smoker    Years: 2.00  . Smokeless tobacco: Never Used  Substance and Sexual Activity  . Alcohol use: Not Currently  . Drug use: Not Currently  . Sexual activity: Not on file  Other Topics Concern  . Not on file  Social History Narrative  . Not on file   Social Determinants of Health   Financial Resource Strain: Not on file  Food Insecurity: Not on file  Transportation Needs: Not on file  Physical Activity: Not on file  Stress: Not on file  Social Connections: Not on file   Family History  Problem Relation Age of Onset  . Ovarian cancer Mother   . Hypertension Father   . Diabetes Father    Allergies  Allergen Reactions  . Other     Per pt, states that she was informed she was allergic to sulfa after allergy testing  "Kidney Disease. Exception to this NSAID intolerance is aspirin 81-325mg  daily"'  . Atenolol     palpitation  . Elemental Sulfur    Prior to Admission medications   Medication Sig Start Date End Date  Taking? Authorizing Provider  amLODipine (NORVASC) 10 MG tablet Take 1 tablet by mouth daily. 11/07/19 11/06/21  [provider]  hydrALAZINE (APRESOLINE) 25 MG tablet Take 25 mg by mouth 3 (three) times daily.    [provider]  hydroxychloroquine (PLAQUENIL) 200 MG tablet Take 1 tablet by mouth daily. 11/23/17 10/20/21  [provider]  propranolol (INDERAL) 20 MG tablet Take 1 and one-half tablets by mouth 3 times a day and 2 tablets at bedtime 02/14/19 02/14/21  [provider]  sevelamer carbonate (RENVELA) 2.4 g PACK Take 1 packet orally as directed 3 times daily with meal 10/06/19 10/05/21  [provider]  sodium bicarbonate 650 MG tablet Take by mouth. 09/26/19 09/26/23  [provider]     Positive ROS: Otherwise negative  All other systems have been reviewed and were otherwise negative with the exception of those mentioned in the HPI and as above.  Physical Exam: Constitutional: Alert, well-appearing, no acute distress Ears: External ears without lesions or tenderness. Ear canals are clear bilaterally with intact, clear TMs bilaterally.  Auscultation of the ears reveal no objective tinnitus. Nasal: External nose without lesions. Septum with minimal deformity.. Clear nasal passages  bilaterally. Oral: Lips and gums without lesions. Tongue and palate mucosa without lesions. Posterior oropharynx clear. Neck: No palpable adenopathy or masses Respiratory: Breathing comfortably  Skin: No facial/neck lesions or rash noted.  Audiologic testing demonstrated essentially normal hearing in both ears with perhaps a mild high-frequency sensorineural hearing loss in the right ear at 8000 frequency.  SRT's were 20 dB bilaterally.  She had type A tympanograms bilaterally.  Procedures  Assessment: Longstanding right ear tinnitus  Plan: Reviewed with her that treatment for tinnitus is limited.  Discussed with her concerning using masking noise when  the tinnitus is bothersome.  Also gave her some samples of Lipo flavonoid that is beneficial in some people. I reviewed the audiologic testing with her which did not demonstrate any significant hearing loss although she did have slight high-frequency sensorineural hearing loss in the right ear compared to the left that is probably the etiology of her tinnitus but the cause of this is unclear.   Radene Journey, MD   CC:

## 2021-02-06 DIAGNOSIS — N186 End stage renal disease: Secondary | ICD-10-CM | POA: Diagnosis not present

## 2021-02-06 DIAGNOSIS — Z992 Dependence on renal dialysis: Secondary | ICD-10-CM | POA: Diagnosis not present

## 2021-02-07 DIAGNOSIS — Z992 Dependence on renal dialysis: Secondary | ICD-10-CM | POA: Diagnosis not present

## 2021-02-07 DIAGNOSIS — N186 End stage renal disease: Secondary | ICD-10-CM | POA: Diagnosis not present

## 2021-02-08 DIAGNOSIS — D509 Iron deficiency anemia, unspecified: Secondary | ICD-10-CM | POA: Diagnosis not present

## 2021-02-08 DIAGNOSIS — Z992 Dependence on renal dialysis: Secondary | ICD-10-CM | POA: Diagnosis not present

## 2021-02-08 DIAGNOSIS — N186 End stage renal disease: Secondary | ICD-10-CM | POA: Diagnosis not present

## 2021-02-09 DIAGNOSIS — Z992 Dependence on renal dialysis: Secondary | ICD-10-CM | POA: Diagnosis not present

## 2021-02-09 DIAGNOSIS — N186 End stage renal disease: Secondary | ICD-10-CM | POA: Diagnosis not present

## 2021-02-10 DIAGNOSIS — Z992 Dependence on renal dialysis: Secondary | ICD-10-CM | POA: Diagnosis not present

## 2021-02-10 DIAGNOSIS — N186 End stage renal disease: Secondary | ICD-10-CM | POA: Diagnosis not present

## 2021-02-11 DIAGNOSIS — N186 End stage renal disease: Secondary | ICD-10-CM | POA: Diagnosis not present

## 2021-02-11 DIAGNOSIS — Z992 Dependence on renal dialysis: Secondary | ICD-10-CM | POA: Diagnosis not present

## 2021-02-12 DIAGNOSIS — Z992 Dependence on renal dialysis: Secondary | ICD-10-CM | POA: Diagnosis not present

## 2021-02-12 DIAGNOSIS — N186 End stage renal disease: Secondary | ICD-10-CM | POA: Diagnosis not present

## 2021-02-13 DIAGNOSIS — N186 End stage renal disease: Secondary | ICD-10-CM | POA: Diagnosis not present

## 2021-02-13 DIAGNOSIS — Z992 Dependence on renal dialysis: Secondary | ICD-10-CM | POA: Diagnosis not present

## 2021-02-14 DIAGNOSIS — Z992 Dependence on renal dialysis: Secondary | ICD-10-CM | POA: Diagnosis not present

## 2021-02-14 DIAGNOSIS — N186 End stage renal disease: Secondary | ICD-10-CM | POA: Diagnosis not present

## 2021-02-15 ENCOUNTER — Encounter (INDEPENDENT_AMBULATORY_CARE_PROVIDER_SITE_OTHER): Payer: Self-pay

## 2021-02-15 DIAGNOSIS — Z992 Dependence on renal dialysis: Secondary | ICD-10-CM | POA: Diagnosis not present

## 2021-02-15 DIAGNOSIS — N186 End stage renal disease: Secondary | ICD-10-CM | POA: Diagnosis not present

## 2021-02-16 DIAGNOSIS — N186 End stage renal disease: Secondary | ICD-10-CM | POA: Diagnosis not present

## 2021-02-16 DIAGNOSIS — Z992 Dependence on renal dialysis: Secondary | ICD-10-CM | POA: Diagnosis not present

## 2021-02-17 DIAGNOSIS — N186 End stage renal disease: Secondary | ICD-10-CM | POA: Diagnosis not present

## 2021-02-17 DIAGNOSIS — Z992 Dependence on renal dialysis: Secondary | ICD-10-CM | POA: Diagnosis not present

## 2021-02-18 DIAGNOSIS — Z992 Dependence on renal dialysis: Secondary | ICD-10-CM | POA: Diagnosis not present

## 2021-02-18 DIAGNOSIS — N186 End stage renal disease: Secondary | ICD-10-CM | POA: Diagnosis not present

## 2021-02-19 DIAGNOSIS — Z992 Dependence on renal dialysis: Secondary | ICD-10-CM | POA: Diagnosis not present

## 2021-02-19 DIAGNOSIS — N186 End stage renal disease: Secondary | ICD-10-CM | POA: Diagnosis not present

## 2021-02-21 DIAGNOSIS — Z992 Dependence on renal dialysis: Secondary | ICD-10-CM | POA: Diagnosis not present

## 2021-02-21 DIAGNOSIS — N186 End stage renal disease: Secondary | ICD-10-CM | POA: Diagnosis not present

## 2021-02-22 DIAGNOSIS — Z992 Dependence on renal dialysis: Secondary | ICD-10-CM | POA: Diagnosis not present

## 2021-02-22 DIAGNOSIS — N186 End stage renal disease: Secondary | ICD-10-CM | POA: Diagnosis not present

## 2021-02-23 DIAGNOSIS — Z992 Dependence on renal dialysis: Secondary | ICD-10-CM | POA: Diagnosis not present

## 2021-02-23 DIAGNOSIS — N186 End stage renal disease: Secondary | ICD-10-CM | POA: Diagnosis not present

## 2021-02-24 DIAGNOSIS — Z992 Dependence on renal dialysis: Secondary | ICD-10-CM | POA: Diagnosis not present

## 2021-02-24 DIAGNOSIS — N186 End stage renal disease: Secondary | ICD-10-CM | POA: Diagnosis not present

## 2021-02-25 DIAGNOSIS — Z992 Dependence on renal dialysis: Secondary | ICD-10-CM | POA: Diagnosis not present

## 2021-02-25 DIAGNOSIS — N186 End stage renal disease: Secondary | ICD-10-CM | POA: Diagnosis not present

## 2021-02-26 DIAGNOSIS — Z992 Dependence on renal dialysis: Secondary | ICD-10-CM | POA: Diagnosis not present

## 2021-02-26 DIAGNOSIS — N186 End stage renal disease: Secondary | ICD-10-CM | POA: Diagnosis not present

## 2021-02-27 DIAGNOSIS — N186 End stage renal disease: Secondary | ICD-10-CM | POA: Diagnosis not present

## 2021-02-27 DIAGNOSIS — Z992 Dependence on renal dialysis: Secondary | ICD-10-CM | POA: Diagnosis not present

## 2021-03-01 DIAGNOSIS — N186 End stage renal disease: Secondary | ICD-10-CM | POA: Diagnosis not present

## 2021-03-01 DIAGNOSIS — Z992 Dependence on renal dialysis: Secondary | ICD-10-CM | POA: Diagnosis not present

## 2021-03-02 DIAGNOSIS — N186 End stage renal disease: Secondary | ICD-10-CM | POA: Diagnosis not present

## 2021-03-02 DIAGNOSIS — Z992 Dependence on renal dialysis: Secondary | ICD-10-CM | POA: Diagnosis not present

## 2021-03-03 DIAGNOSIS — N186 End stage renal disease: Secondary | ICD-10-CM | POA: Diagnosis not present

## 2021-03-03 DIAGNOSIS — Z992 Dependence on renal dialysis: Secondary | ICD-10-CM | POA: Diagnosis not present

## 2021-03-04 DIAGNOSIS — N186 End stage renal disease: Secondary | ICD-10-CM | POA: Diagnosis not present

## 2021-03-04 DIAGNOSIS — Z992 Dependence on renal dialysis: Secondary | ICD-10-CM | POA: Diagnosis not present

## 2021-03-05 DIAGNOSIS — D509 Iron deficiency anemia, unspecified: Secondary | ICD-10-CM | POA: Diagnosis not present

## 2021-03-05 DIAGNOSIS — Z992 Dependence on renal dialysis: Secondary | ICD-10-CM | POA: Diagnosis not present

## 2021-03-05 DIAGNOSIS — N186 End stage renal disease: Secondary | ICD-10-CM | POA: Diagnosis not present

## 2021-03-06 DIAGNOSIS — Z992 Dependence on renal dialysis: Secondary | ICD-10-CM | POA: Diagnosis not present

## 2021-03-06 DIAGNOSIS — N186 End stage renal disease: Secondary | ICD-10-CM | POA: Diagnosis not present

## 2021-03-07 DIAGNOSIS — Z992 Dependence on renal dialysis: Secondary | ICD-10-CM | POA: Diagnosis not present

## 2021-03-07 DIAGNOSIS — N186 End stage renal disease: Secondary | ICD-10-CM | POA: Diagnosis not present

## 2021-03-08 DIAGNOSIS — Z992 Dependence on renal dialysis: Secondary | ICD-10-CM | POA: Diagnosis not present

## 2021-03-08 DIAGNOSIS — N186 End stage renal disease: Secondary | ICD-10-CM | POA: Diagnosis not present

## 2021-03-09 DIAGNOSIS — N186 End stage renal disease: Secondary | ICD-10-CM | POA: Diagnosis not present

## 2021-03-09 DIAGNOSIS — Z992 Dependence on renal dialysis: Secondary | ICD-10-CM | POA: Diagnosis not present

## 2021-03-10 DIAGNOSIS — N186 End stage renal disease: Secondary | ICD-10-CM | POA: Diagnosis not present

## 2021-03-10 DIAGNOSIS — Z992 Dependence on renal dialysis: Secondary | ICD-10-CM | POA: Diagnosis not present

## 2021-03-11 DIAGNOSIS — N186 End stage renal disease: Secondary | ICD-10-CM | POA: Diagnosis not present

## 2021-03-11 DIAGNOSIS — Z992 Dependence on renal dialysis: Secondary | ICD-10-CM | POA: Diagnosis not present

## 2021-03-12 DIAGNOSIS — Z992 Dependence on renal dialysis: Secondary | ICD-10-CM | POA: Diagnosis not present

## 2021-03-12 DIAGNOSIS — N186 End stage renal disease: Secondary | ICD-10-CM | POA: Diagnosis not present

## 2021-03-13 DIAGNOSIS — N186 End stage renal disease: Secondary | ICD-10-CM | POA: Diagnosis not present

## 2021-03-13 DIAGNOSIS — Z992 Dependence on renal dialysis: Secondary | ICD-10-CM | POA: Diagnosis not present

## 2021-03-14 DIAGNOSIS — N186 End stage renal disease: Secondary | ICD-10-CM | POA: Diagnosis not present

## 2021-03-14 DIAGNOSIS — Z992 Dependence on renal dialysis: Secondary | ICD-10-CM | POA: Diagnosis not present

## 2021-03-15 DIAGNOSIS — N186 End stage renal disease: Secondary | ICD-10-CM | POA: Diagnosis not present

## 2021-03-15 DIAGNOSIS — Z992 Dependence on renal dialysis: Secondary | ICD-10-CM | POA: Diagnosis not present

## 2021-03-16 DIAGNOSIS — Z992 Dependence on renal dialysis: Secondary | ICD-10-CM | POA: Diagnosis not present

## 2021-03-16 DIAGNOSIS — N186 End stage renal disease: Secondary | ICD-10-CM | POA: Diagnosis not present

## 2021-03-17 DIAGNOSIS — N186 End stage renal disease: Secondary | ICD-10-CM | POA: Diagnosis not present

## 2021-03-17 DIAGNOSIS — Z992 Dependence on renal dialysis: Secondary | ICD-10-CM | POA: Diagnosis not present

## 2021-03-18 DIAGNOSIS — N186 End stage renal disease: Secondary | ICD-10-CM | POA: Diagnosis not present

## 2021-03-18 DIAGNOSIS — Z992 Dependence on renal dialysis: Secondary | ICD-10-CM | POA: Diagnosis not present

## 2021-03-19 DIAGNOSIS — Z992 Dependence on renal dialysis: Secondary | ICD-10-CM | POA: Diagnosis not present

## 2021-03-19 DIAGNOSIS — N186 End stage renal disease: Secondary | ICD-10-CM | POA: Diagnosis not present

## 2021-03-20 DIAGNOSIS — Z992 Dependence on renal dialysis: Secondary | ICD-10-CM | POA: Diagnosis not present

## 2021-03-20 DIAGNOSIS — N186 End stage renal disease: Secondary | ICD-10-CM | POA: Diagnosis not present

## 2021-03-21 DIAGNOSIS — N186 End stage renal disease: Secondary | ICD-10-CM | POA: Diagnosis not present

## 2021-03-21 DIAGNOSIS — Z992 Dependence on renal dialysis: Secondary | ICD-10-CM | POA: Diagnosis not present

## 2021-03-23 DIAGNOSIS — N186 End stage renal disease: Secondary | ICD-10-CM | POA: Diagnosis not present

## 2021-03-23 DIAGNOSIS — Z992 Dependence on renal dialysis: Secondary | ICD-10-CM | POA: Diagnosis not present

## 2021-03-24 DIAGNOSIS — N186 End stage renal disease: Secondary | ICD-10-CM | POA: Diagnosis not present

## 2021-03-24 DIAGNOSIS — Z992 Dependence on renal dialysis: Secondary | ICD-10-CM | POA: Diagnosis not present

## 2021-03-25 DIAGNOSIS — N186 End stage renal disease: Secondary | ICD-10-CM | POA: Diagnosis not present

## 2021-03-25 DIAGNOSIS — Z992 Dependence on renal dialysis: Secondary | ICD-10-CM | POA: Diagnosis not present

## 2021-03-26 DIAGNOSIS — Z992 Dependence on renal dialysis: Secondary | ICD-10-CM | POA: Diagnosis not present

## 2021-03-26 DIAGNOSIS — N186 End stage renal disease: Secondary | ICD-10-CM | POA: Diagnosis not present

## 2021-03-27 DIAGNOSIS — Z992 Dependence on renal dialysis: Secondary | ICD-10-CM | POA: Diagnosis not present

## 2021-03-27 DIAGNOSIS — N186 End stage renal disease: Secondary | ICD-10-CM | POA: Diagnosis not present

## 2021-03-28 DIAGNOSIS — Z992 Dependence on renal dialysis: Secondary | ICD-10-CM | POA: Diagnosis not present

## 2021-03-28 DIAGNOSIS — N186 End stage renal disease: Secondary | ICD-10-CM | POA: Diagnosis not present

## 2021-03-29 DIAGNOSIS — N186 End stage renal disease: Secondary | ICD-10-CM | POA: Diagnosis not present

## 2021-03-29 DIAGNOSIS — Z992 Dependence on renal dialysis: Secondary | ICD-10-CM | POA: Diagnosis not present

## 2021-03-30 DIAGNOSIS — Z992 Dependence on renal dialysis: Secondary | ICD-10-CM | POA: Diagnosis not present

## 2021-03-30 DIAGNOSIS — N186 End stage renal disease: Secondary | ICD-10-CM | POA: Diagnosis not present

## 2021-03-31 DIAGNOSIS — N186 End stage renal disease: Secondary | ICD-10-CM | POA: Diagnosis not present

## 2021-03-31 DIAGNOSIS — Z992 Dependence on renal dialysis: Secondary | ICD-10-CM | POA: Diagnosis not present

## 2021-04-01 DIAGNOSIS — Z992 Dependence on renal dialysis: Secondary | ICD-10-CM | POA: Diagnosis not present

## 2021-04-01 DIAGNOSIS — N186 End stage renal disease: Secondary | ICD-10-CM | POA: Diagnosis not present

## 2021-04-02 DIAGNOSIS — N186 End stage renal disease: Secondary | ICD-10-CM | POA: Diagnosis not present

## 2021-04-02 DIAGNOSIS — Z992 Dependence on renal dialysis: Secondary | ICD-10-CM | POA: Diagnosis not present

## 2021-04-03 DIAGNOSIS — N186 End stage renal disease: Secondary | ICD-10-CM | POA: Diagnosis not present

## 2021-04-03 DIAGNOSIS — Z992 Dependence on renal dialysis: Secondary | ICD-10-CM | POA: Diagnosis not present

## 2021-04-04 DIAGNOSIS — N186 End stage renal disease: Secondary | ICD-10-CM | POA: Diagnosis not present

## 2021-04-04 DIAGNOSIS — D509 Iron deficiency anemia, unspecified: Secondary | ICD-10-CM | POA: Diagnosis not present

## 2021-04-04 DIAGNOSIS — Z992 Dependence on renal dialysis: Secondary | ICD-10-CM | POA: Diagnosis not present

## 2021-04-05 DIAGNOSIS — N186 End stage renal disease: Secondary | ICD-10-CM | POA: Diagnosis not present

## 2021-04-05 DIAGNOSIS — Z992 Dependence on renal dialysis: Secondary | ICD-10-CM | POA: Diagnosis not present

## 2021-04-06 DIAGNOSIS — Z992 Dependence on renal dialysis: Secondary | ICD-10-CM | POA: Diagnosis not present

## 2021-04-06 DIAGNOSIS — N186 End stage renal disease: Secondary | ICD-10-CM | POA: Diagnosis not present

## 2021-04-07 DIAGNOSIS — N186 End stage renal disease: Secondary | ICD-10-CM | POA: Diagnosis not present

## 2021-04-07 DIAGNOSIS — Z992 Dependence on renal dialysis: Secondary | ICD-10-CM | POA: Diagnosis not present

## 2021-04-08 DIAGNOSIS — N186 End stage renal disease: Secondary | ICD-10-CM | POA: Diagnosis not present

## 2021-04-08 DIAGNOSIS — Z992 Dependence on renal dialysis: Secondary | ICD-10-CM | POA: Diagnosis not present

## 2021-04-09 DIAGNOSIS — Z992 Dependence on renal dialysis: Secondary | ICD-10-CM | POA: Diagnosis not present

## 2021-04-09 DIAGNOSIS — N186 End stage renal disease: Secondary | ICD-10-CM | POA: Diagnosis not present

## 2021-04-10 DIAGNOSIS — Z992 Dependence on renal dialysis: Secondary | ICD-10-CM | POA: Diagnosis not present

## 2021-04-10 DIAGNOSIS — N186 End stage renal disease: Secondary | ICD-10-CM | POA: Diagnosis not present

## 2021-04-11 DIAGNOSIS — N186 End stage renal disease: Secondary | ICD-10-CM | POA: Diagnosis not present

## 2021-04-11 DIAGNOSIS — Z992 Dependence on renal dialysis: Secondary | ICD-10-CM | POA: Diagnosis not present

## 2021-04-12 DIAGNOSIS — N186 End stage renal disease: Secondary | ICD-10-CM | POA: Diagnosis not present

## 2021-04-12 DIAGNOSIS — Z992 Dependence on renal dialysis: Secondary | ICD-10-CM | POA: Diagnosis not present

## 2021-04-13 DIAGNOSIS — Z992 Dependence on renal dialysis: Secondary | ICD-10-CM | POA: Diagnosis not present

## 2021-04-13 DIAGNOSIS — N186 End stage renal disease: Secondary | ICD-10-CM | POA: Diagnosis not present

## 2021-04-14 DIAGNOSIS — N186 End stage renal disease: Secondary | ICD-10-CM | POA: Diagnosis not present

## 2021-04-14 DIAGNOSIS — Z992 Dependence on renal dialysis: Secondary | ICD-10-CM | POA: Diagnosis not present

## 2021-04-15 DIAGNOSIS — Z992 Dependence on renal dialysis: Secondary | ICD-10-CM | POA: Diagnosis not present

## 2021-04-15 DIAGNOSIS — N186 End stage renal disease: Secondary | ICD-10-CM | POA: Diagnosis not present

## 2021-04-16 DIAGNOSIS — Z992 Dependence on renal dialysis: Secondary | ICD-10-CM | POA: Diagnosis not present

## 2021-04-16 DIAGNOSIS — N186 End stage renal disease: Secondary | ICD-10-CM | POA: Diagnosis not present

## 2021-04-17 DIAGNOSIS — Z992 Dependence on renal dialysis: Secondary | ICD-10-CM | POA: Diagnosis not present

## 2021-04-17 DIAGNOSIS — N186 End stage renal disease: Secondary | ICD-10-CM | POA: Diagnosis not present

## 2021-04-18 DIAGNOSIS — Z992 Dependence on renal dialysis: Secondary | ICD-10-CM | POA: Diagnosis not present

## 2021-04-18 DIAGNOSIS — N186 End stage renal disease: Secondary | ICD-10-CM | POA: Diagnosis not present

## 2021-04-19 DIAGNOSIS — Z992 Dependence on renal dialysis: Secondary | ICD-10-CM | POA: Diagnosis not present

## 2021-04-19 DIAGNOSIS — N186 End stage renal disease: Secondary | ICD-10-CM | POA: Diagnosis not present

## 2021-04-20 DIAGNOSIS — Z992 Dependence on renal dialysis: Secondary | ICD-10-CM | POA: Diagnosis not present

## 2021-04-20 DIAGNOSIS — N186 End stage renal disease: Secondary | ICD-10-CM | POA: Diagnosis not present

## 2021-04-21 DIAGNOSIS — Z992 Dependence on renal dialysis: Secondary | ICD-10-CM | POA: Diagnosis not present

## 2021-04-21 DIAGNOSIS — N186 End stage renal disease: Secondary | ICD-10-CM | POA: Diagnosis not present

## 2021-04-22 DIAGNOSIS — N186 End stage renal disease: Secondary | ICD-10-CM | POA: Diagnosis not present

## 2021-04-22 DIAGNOSIS — Z992 Dependence on renal dialysis: Secondary | ICD-10-CM | POA: Diagnosis not present

## 2021-04-23 DIAGNOSIS — N186 End stage renal disease: Secondary | ICD-10-CM | POA: Diagnosis not present

## 2021-04-23 DIAGNOSIS — Z992 Dependence on renal dialysis: Secondary | ICD-10-CM | POA: Diagnosis not present

## 2021-04-24 DIAGNOSIS — N186 End stage renal disease: Secondary | ICD-10-CM | POA: Diagnosis not present

## 2021-04-24 DIAGNOSIS — Z992 Dependence on renal dialysis: Secondary | ICD-10-CM | POA: Diagnosis not present

## 2021-04-25 DIAGNOSIS — Z992 Dependence on renal dialysis: Secondary | ICD-10-CM | POA: Diagnosis not present

## 2021-04-25 DIAGNOSIS — N186 End stage renal disease: Secondary | ICD-10-CM | POA: Diagnosis not present

## 2021-04-26 DIAGNOSIS — Z992 Dependence on renal dialysis: Secondary | ICD-10-CM | POA: Diagnosis not present

## 2021-04-26 DIAGNOSIS — N186 End stage renal disease: Secondary | ICD-10-CM | POA: Diagnosis not present

## 2021-04-27 DIAGNOSIS — Z992 Dependence on renal dialysis: Secondary | ICD-10-CM | POA: Diagnosis not present

## 2021-04-27 DIAGNOSIS — N186 End stage renal disease: Secondary | ICD-10-CM | POA: Diagnosis not present

## 2021-04-28 DIAGNOSIS — Z992 Dependence on renal dialysis: Secondary | ICD-10-CM | POA: Diagnosis not present

## 2021-04-28 DIAGNOSIS — N186 End stage renal disease: Secondary | ICD-10-CM | POA: Diagnosis not present

## 2021-04-29 DIAGNOSIS — Z992 Dependence on renal dialysis: Secondary | ICD-10-CM | POA: Diagnosis not present

## 2021-04-29 DIAGNOSIS — N186 End stage renal disease: Secondary | ICD-10-CM | POA: Diagnosis not present

## 2021-04-30 DIAGNOSIS — N186 End stage renal disease: Secondary | ICD-10-CM | POA: Diagnosis not present

## 2021-04-30 DIAGNOSIS — Z992 Dependence on renal dialysis: Secondary | ICD-10-CM | POA: Diagnosis not present

## 2021-05-01 DIAGNOSIS — N186 End stage renal disease: Secondary | ICD-10-CM | POA: Diagnosis not present

## 2021-05-01 DIAGNOSIS — Z992 Dependence on renal dialysis: Secondary | ICD-10-CM | POA: Diagnosis not present

## 2021-05-02 DIAGNOSIS — Z992 Dependence on renal dialysis: Secondary | ICD-10-CM | POA: Diagnosis not present

## 2021-05-02 DIAGNOSIS — N186 End stage renal disease: Secondary | ICD-10-CM | POA: Diagnosis not present

## 2021-05-03 DIAGNOSIS — Z992 Dependence on renal dialysis: Secondary | ICD-10-CM | POA: Diagnosis not present

## 2021-05-03 DIAGNOSIS — N186 End stage renal disease: Secondary | ICD-10-CM | POA: Diagnosis not present

## 2021-05-04 DIAGNOSIS — Z992 Dependence on renal dialysis: Secondary | ICD-10-CM | POA: Diagnosis not present

## 2021-05-04 DIAGNOSIS — N186 End stage renal disease: Secondary | ICD-10-CM | POA: Diagnosis not present

## 2021-05-05 DIAGNOSIS — N186 End stage renal disease: Secondary | ICD-10-CM | POA: Diagnosis not present

## 2021-05-05 DIAGNOSIS — Z992 Dependence on renal dialysis: Secondary | ICD-10-CM | POA: Diagnosis not present

## 2021-05-06 DIAGNOSIS — Z992 Dependence on renal dialysis: Secondary | ICD-10-CM | POA: Diagnosis not present

## 2021-05-06 DIAGNOSIS — N186 End stage renal disease: Secondary | ICD-10-CM | POA: Diagnosis not present

## 2021-05-07 DIAGNOSIS — Z992 Dependence on renal dialysis: Secondary | ICD-10-CM | POA: Diagnosis not present

## 2021-05-07 DIAGNOSIS — N186 End stage renal disease: Secondary | ICD-10-CM | POA: Diagnosis not present

## 2021-05-08 DIAGNOSIS — N186 End stage renal disease: Secondary | ICD-10-CM | POA: Diagnosis not present

## 2021-05-08 DIAGNOSIS — Z992 Dependence on renal dialysis: Secondary | ICD-10-CM | POA: Diagnosis not present

## 2021-05-09 DIAGNOSIS — Z992 Dependence on renal dialysis: Secondary | ICD-10-CM | POA: Diagnosis not present

## 2021-05-09 DIAGNOSIS — N186 End stage renal disease: Secondary | ICD-10-CM | POA: Diagnosis not present

## 2021-05-10 DIAGNOSIS — Z992 Dependence on renal dialysis: Secondary | ICD-10-CM | POA: Diagnosis not present

## 2021-05-10 DIAGNOSIS — N186 End stage renal disease: Secondary | ICD-10-CM | POA: Diagnosis not present

## 2021-05-11 DIAGNOSIS — N186 End stage renal disease: Secondary | ICD-10-CM | POA: Diagnosis not present

## 2021-05-11 DIAGNOSIS — Z992 Dependence on renal dialysis: Secondary | ICD-10-CM | POA: Diagnosis not present

## 2021-05-12 DIAGNOSIS — N186 End stage renal disease: Secondary | ICD-10-CM | POA: Diagnosis not present

## 2021-05-12 DIAGNOSIS — Z992 Dependence on renal dialysis: Secondary | ICD-10-CM | POA: Diagnosis not present

## 2021-05-13 DIAGNOSIS — Z992 Dependence on renal dialysis: Secondary | ICD-10-CM | POA: Diagnosis not present

## 2021-05-13 DIAGNOSIS — N186 End stage renal disease: Secondary | ICD-10-CM | POA: Diagnosis not present

## 2021-05-14 DIAGNOSIS — Z992 Dependence on renal dialysis: Secondary | ICD-10-CM | POA: Diagnosis not present

## 2021-05-14 DIAGNOSIS — N186 End stage renal disease: Secondary | ICD-10-CM | POA: Diagnosis not present

## 2021-05-15 DIAGNOSIS — N186 End stage renal disease: Secondary | ICD-10-CM | POA: Diagnosis not present

## 2021-05-15 DIAGNOSIS — D509 Iron deficiency anemia, unspecified: Secondary | ICD-10-CM | POA: Diagnosis not present

## 2021-05-15 DIAGNOSIS — Z992 Dependence on renal dialysis: Secondary | ICD-10-CM | POA: Diagnosis not present

## 2021-05-16 DIAGNOSIS — N186 End stage renal disease: Secondary | ICD-10-CM | POA: Diagnosis not present

## 2021-05-16 DIAGNOSIS — Z992 Dependence on renal dialysis: Secondary | ICD-10-CM | POA: Diagnosis not present

## 2021-05-17 DIAGNOSIS — Z992 Dependence on renal dialysis: Secondary | ICD-10-CM | POA: Diagnosis not present

## 2021-05-17 DIAGNOSIS — N186 End stage renal disease: Secondary | ICD-10-CM | POA: Diagnosis not present

## 2021-05-18 DIAGNOSIS — Z992 Dependence on renal dialysis: Secondary | ICD-10-CM | POA: Diagnosis not present

## 2021-05-18 DIAGNOSIS — N186 End stage renal disease: Secondary | ICD-10-CM | POA: Diagnosis not present

## 2021-05-19 DIAGNOSIS — N186 End stage renal disease: Secondary | ICD-10-CM | POA: Diagnosis not present

## 2021-05-19 DIAGNOSIS — Z992 Dependence on renal dialysis: Secondary | ICD-10-CM | POA: Diagnosis not present

## 2021-05-20 DIAGNOSIS — Z992 Dependence on renal dialysis: Secondary | ICD-10-CM | POA: Diagnosis not present

## 2021-05-20 DIAGNOSIS — N186 End stage renal disease: Secondary | ICD-10-CM | POA: Diagnosis not present

## 2021-05-21 DIAGNOSIS — Z992 Dependence on renal dialysis: Secondary | ICD-10-CM | POA: Diagnosis not present

## 2021-05-21 DIAGNOSIS — N186 End stage renal disease: Secondary | ICD-10-CM | POA: Diagnosis not present

## 2021-05-22 DIAGNOSIS — Z992 Dependence on renal dialysis: Secondary | ICD-10-CM | POA: Diagnosis not present

## 2021-05-22 DIAGNOSIS — N186 End stage renal disease: Secondary | ICD-10-CM | POA: Diagnosis not present

## 2021-05-23 DIAGNOSIS — Z992 Dependence on renal dialysis: Secondary | ICD-10-CM | POA: Diagnosis not present

## 2021-05-23 DIAGNOSIS — N186 End stage renal disease: Secondary | ICD-10-CM | POA: Diagnosis not present

## 2021-05-24 DIAGNOSIS — Z992 Dependence on renal dialysis: Secondary | ICD-10-CM | POA: Diagnosis not present

## 2021-05-24 DIAGNOSIS — N186 End stage renal disease: Secondary | ICD-10-CM | POA: Diagnosis not present

## 2021-05-25 DIAGNOSIS — Z992 Dependence on renal dialysis: Secondary | ICD-10-CM | POA: Diagnosis not present

## 2021-05-25 DIAGNOSIS — N186 End stage renal disease: Secondary | ICD-10-CM | POA: Diagnosis not present

## 2021-05-26 DIAGNOSIS — Z992 Dependence on renal dialysis: Secondary | ICD-10-CM | POA: Diagnosis not present

## 2021-05-26 DIAGNOSIS — N186 End stage renal disease: Secondary | ICD-10-CM | POA: Diagnosis not present

## 2021-05-27 DIAGNOSIS — N186 End stage renal disease: Secondary | ICD-10-CM | POA: Diagnosis not present

## 2021-05-27 DIAGNOSIS — Z992 Dependence on renal dialysis: Secondary | ICD-10-CM | POA: Diagnosis not present

## 2021-05-28 DIAGNOSIS — N186 End stage renal disease: Secondary | ICD-10-CM | POA: Diagnosis not present

## 2021-05-28 DIAGNOSIS — Z992 Dependence on renal dialysis: Secondary | ICD-10-CM | POA: Diagnosis not present

## 2021-05-29 DIAGNOSIS — Z992 Dependence on renal dialysis: Secondary | ICD-10-CM | POA: Diagnosis not present

## 2021-05-29 DIAGNOSIS — N186 End stage renal disease: Secondary | ICD-10-CM | POA: Diagnosis not present

## 2021-05-30 DIAGNOSIS — N186 End stage renal disease: Secondary | ICD-10-CM | POA: Diagnosis not present

## 2021-05-30 DIAGNOSIS — Z992 Dependence on renal dialysis: Secondary | ICD-10-CM | POA: Diagnosis not present

## 2021-05-31 DIAGNOSIS — Z992 Dependence on renal dialysis: Secondary | ICD-10-CM | POA: Diagnosis not present

## 2021-05-31 DIAGNOSIS — N186 End stage renal disease: Secondary | ICD-10-CM | POA: Diagnosis not present

## 2021-06-01 DIAGNOSIS — N186 End stage renal disease: Secondary | ICD-10-CM | POA: Diagnosis not present

## 2021-06-01 DIAGNOSIS — Z992 Dependence on renal dialysis: Secondary | ICD-10-CM | POA: Diagnosis not present

## 2021-06-02 DIAGNOSIS — N186 End stage renal disease: Secondary | ICD-10-CM | POA: Diagnosis not present

## 2021-06-02 DIAGNOSIS — Z992 Dependence on renal dialysis: Secondary | ICD-10-CM | POA: Diagnosis not present

## 2021-06-03 DIAGNOSIS — N186 End stage renal disease: Secondary | ICD-10-CM | POA: Diagnosis not present

## 2021-06-03 DIAGNOSIS — Z23 Encounter for immunization: Secondary | ICD-10-CM | POA: Diagnosis not present

## 2021-06-03 DIAGNOSIS — Z992 Dependence on renal dialysis: Secondary | ICD-10-CM | POA: Diagnosis not present

## 2021-06-04 DIAGNOSIS — D509 Iron deficiency anemia, unspecified: Secondary | ICD-10-CM | POA: Diagnosis not present

## 2021-06-04 DIAGNOSIS — Z23 Encounter for immunization: Secondary | ICD-10-CM | POA: Diagnosis not present

## 2021-06-04 DIAGNOSIS — Z992 Dependence on renal dialysis: Secondary | ICD-10-CM | POA: Diagnosis not present

## 2021-06-04 DIAGNOSIS — N186 End stage renal disease: Secondary | ICD-10-CM | POA: Diagnosis not present

## 2021-06-05 DIAGNOSIS — Z992 Dependence on renal dialysis: Secondary | ICD-10-CM | POA: Diagnosis not present

## 2021-06-05 DIAGNOSIS — N186 End stage renal disease: Secondary | ICD-10-CM | POA: Diagnosis not present

## 2021-06-05 DIAGNOSIS — Z23 Encounter for immunization: Secondary | ICD-10-CM | POA: Diagnosis not present

## 2021-06-06 DIAGNOSIS — Z23 Encounter for immunization: Secondary | ICD-10-CM | POA: Diagnosis not present

## 2021-06-06 DIAGNOSIS — Z992 Dependence on renal dialysis: Secondary | ICD-10-CM | POA: Diagnosis not present

## 2021-06-06 DIAGNOSIS — N186 End stage renal disease: Secondary | ICD-10-CM | POA: Diagnosis not present

## 2021-06-07 DIAGNOSIS — Z23 Encounter for immunization: Secondary | ICD-10-CM | POA: Diagnosis not present

## 2021-06-07 DIAGNOSIS — N186 End stage renal disease: Secondary | ICD-10-CM | POA: Diagnosis not present

## 2021-06-07 DIAGNOSIS — Z992 Dependence on renal dialysis: Secondary | ICD-10-CM | POA: Diagnosis not present

## 2021-06-08 DIAGNOSIS — Z992 Dependence on renal dialysis: Secondary | ICD-10-CM | POA: Diagnosis not present

## 2021-06-08 DIAGNOSIS — N186 End stage renal disease: Secondary | ICD-10-CM | POA: Diagnosis not present

## 2021-06-08 DIAGNOSIS — Z23 Encounter for immunization: Secondary | ICD-10-CM | POA: Diagnosis not present

## 2021-06-09 DIAGNOSIS — Z23 Encounter for immunization: Secondary | ICD-10-CM | POA: Diagnosis not present

## 2021-06-09 DIAGNOSIS — Z992 Dependence on renal dialysis: Secondary | ICD-10-CM | POA: Diagnosis not present

## 2021-06-09 DIAGNOSIS — N186 End stage renal disease: Secondary | ICD-10-CM | POA: Diagnosis not present

## 2021-06-10 DIAGNOSIS — N186 End stage renal disease: Secondary | ICD-10-CM | POA: Diagnosis not present

## 2021-06-10 DIAGNOSIS — Z23 Encounter for immunization: Secondary | ICD-10-CM | POA: Diagnosis not present

## 2021-06-10 DIAGNOSIS — Z992 Dependence on renal dialysis: Secondary | ICD-10-CM | POA: Diagnosis not present

## 2021-06-11 DIAGNOSIS — Z992 Dependence on renal dialysis: Secondary | ICD-10-CM | POA: Diagnosis not present

## 2021-06-11 DIAGNOSIS — Z23 Encounter for immunization: Secondary | ICD-10-CM | POA: Diagnosis not present

## 2021-06-11 DIAGNOSIS — N186 End stage renal disease: Secondary | ICD-10-CM | POA: Diagnosis not present

## 2021-06-12 DIAGNOSIS — N186 End stage renal disease: Secondary | ICD-10-CM | POA: Diagnosis not present

## 2021-06-12 DIAGNOSIS — Z23 Encounter for immunization: Secondary | ICD-10-CM | POA: Diagnosis not present

## 2021-06-12 DIAGNOSIS — Z992 Dependence on renal dialysis: Secondary | ICD-10-CM | POA: Diagnosis not present

## 2021-06-13 DIAGNOSIS — Z23 Encounter for immunization: Secondary | ICD-10-CM | POA: Diagnosis not present

## 2021-06-13 DIAGNOSIS — Z992 Dependence on renal dialysis: Secondary | ICD-10-CM | POA: Diagnosis not present

## 2021-06-13 DIAGNOSIS — N186 End stage renal disease: Secondary | ICD-10-CM | POA: Diagnosis not present

## 2021-06-14 DIAGNOSIS — N186 End stage renal disease: Secondary | ICD-10-CM | POA: Diagnosis not present

## 2021-06-14 DIAGNOSIS — Z992 Dependence on renal dialysis: Secondary | ICD-10-CM | POA: Diagnosis not present

## 2021-06-14 DIAGNOSIS — Z23 Encounter for immunization: Secondary | ICD-10-CM | POA: Diagnosis not present

## 2021-06-15 DIAGNOSIS — Z23 Encounter for immunization: Secondary | ICD-10-CM | POA: Diagnosis not present

## 2021-06-15 DIAGNOSIS — Z992 Dependence on renal dialysis: Secondary | ICD-10-CM | POA: Diagnosis not present

## 2021-06-15 DIAGNOSIS — N186 End stage renal disease: Secondary | ICD-10-CM | POA: Diagnosis not present

## 2021-06-16 DIAGNOSIS — N186 End stage renal disease: Secondary | ICD-10-CM | POA: Diagnosis not present

## 2021-06-16 DIAGNOSIS — Z992 Dependence on renal dialysis: Secondary | ICD-10-CM | POA: Diagnosis not present

## 2021-06-16 DIAGNOSIS — Z23 Encounter for immunization: Secondary | ICD-10-CM | POA: Diagnosis not present

## 2021-06-17 DIAGNOSIS — Z23 Encounter for immunization: Secondary | ICD-10-CM | POA: Diagnosis not present

## 2021-06-17 DIAGNOSIS — Z992 Dependence on renal dialysis: Secondary | ICD-10-CM | POA: Diagnosis not present

## 2021-06-17 DIAGNOSIS — N186 End stage renal disease: Secondary | ICD-10-CM | POA: Diagnosis not present

## 2021-06-18 DIAGNOSIS — Z23 Encounter for immunization: Secondary | ICD-10-CM | POA: Diagnosis not present

## 2021-06-18 DIAGNOSIS — Z992 Dependence on renal dialysis: Secondary | ICD-10-CM | POA: Diagnosis not present

## 2021-06-18 DIAGNOSIS — N186 End stage renal disease: Secondary | ICD-10-CM | POA: Diagnosis not present

## 2021-06-19 DIAGNOSIS — N186 End stage renal disease: Secondary | ICD-10-CM | POA: Diagnosis not present

## 2021-06-19 DIAGNOSIS — Z992 Dependence on renal dialysis: Secondary | ICD-10-CM | POA: Diagnosis not present

## 2021-06-19 DIAGNOSIS — Z23 Encounter for immunization: Secondary | ICD-10-CM | POA: Diagnosis not present

## 2021-06-20 DIAGNOSIS — Z992 Dependence on renal dialysis: Secondary | ICD-10-CM | POA: Diagnosis not present

## 2021-06-20 DIAGNOSIS — N186 End stage renal disease: Secondary | ICD-10-CM | POA: Diagnosis not present

## 2021-06-20 DIAGNOSIS — Z23 Encounter for immunization: Secondary | ICD-10-CM | POA: Diagnosis not present

## 2021-06-21 DIAGNOSIS — Z23 Encounter for immunization: Secondary | ICD-10-CM | POA: Diagnosis not present

## 2021-06-21 DIAGNOSIS — Z992 Dependence on renal dialysis: Secondary | ICD-10-CM | POA: Diagnosis not present

## 2021-06-21 DIAGNOSIS — N186 End stage renal disease: Secondary | ICD-10-CM | POA: Diagnosis not present

## 2021-06-22 DIAGNOSIS — N186 End stage renal disease: Secondary | ICD-10-CM | POA: Diagnosis not present

## 2021-06-22 DIAGNOSIS — Z992 Dependence on renal dialysis: Secondary | ICD-10-CM | POA: Diagnosis not present

## 2021-06-22 DIAGNOSIS — Z23 Encounter for immunization: Secondary | ICD-10-CM | POA: Diagnosis not present

## 2021-06-23 DIAGNOSIS — Z23 Encounter for immunization: Secondary | ICD-10-CM | POA: Diagnosis not present

## 2021-06-23 DIAGNOSIS — N186 End stage renal disease: Secondary | ICD-10-CM | POA: Diagnosis not present

## 2021-06-23 DIAGNOSIS — Z992 Dependence on renal dialysis: Secondary | ICD-10-CM | POA: Diagnosis not present

## 2021-06-24 DIAGNOSIS — N186 End stage renal disease: Secondary | ICD-10-CM | POA: Diagnosis not present

## 2021-06-24 DIAGNOSIS — Z992 Dependence on renal dialysis: Secondary | ICD-10-CM | POA: Diagnosis not present

## 2021-06-24 DIAGNOSIS — Z23 Encounter for immunization: Secondary | ICD-10-CM | POA: Diagnosis not present

## 2021-06-25 DIAGNOSIS — Z23 Encounter for immunization: Secondary | ICD-10-CM | POA: Diagnosis not present

## 2021-06-25 DIAGNOSIS — Z992 Dependence on renal dialysis: Secondary | ICD-10-CM | POA: Diagnosis not present

## 2021-06-25 DIAGNOSIS — N186 End stage renal disease: Secondary | ICD-10-CM | POA: Diagnosis not present

## 2021-06-26 DIAGNOSIS — Z23 Encounter for immunization: Secondary | ICD-10-CM | POA: Diagnosis not present

## 2021-06-26 DIAGNOSIS — Z992 Dependence on renal dialysis: Secondary | ICD-10-CM | POA: Diagnosis not present

## 2021-06-26 DIAGNOSIS — N186 End stage renal disease: Secondary | ICD-10-CM | POA: Diagnosis not present

## 2021-06-27 DIAGNOSIS — N186 End stage renal disease: Secondary | ICD-10-CM | POA: Diagnosis not present

## 2021-06-27 DIAGNOSIS — Z23 Encounter for immunization: Secondary | ICD-10-CM | POA: Diagnosis not present

## 2021-06-27 DIAGNOSIS — Z992 Dependence on renal dialysis: Secondary | ICD-10-CM | POA: Diagnosis not present

## 2021-06-28 DIAGNOSIS — Z23 Encounter for immunization: Secondary | ICD-10-CM | POA: Diagnosis not present

## 2021-06-28 DIAGNOSIS — Z992 Dependence on renal dialysis: Secondary | ICD-10-CM | POA: Diagnosis not present

## 2021-06-28 DIAGNOSIS — N186 End stage renal disease: Secondary | ICD-10-CM | POA: Diagnosis not present

## 2021-06-29 DIAGNOSIS — Z23 Encounter for immunization: Secondary | ICD-10-CM | POA: Diagnosis not present

## 2021-06-29 DIAGNOSIS — Z992 Dependence on renal dialysis: Secondary | ICD-10-CM | POA: Diagnosis not present

## 2021-06-29 DIAGNOSIS — N186 End stage renal disease: Secondary | ICD-10-CM | POA: Diagnosis not present

## 2021-06-30 DIAGNOSIS — Z23 Encounter for immunization: Secondary | ICD-10-CM | POA: Diagnosis not present

## 2021-06-30 DIAGNOSIS — N186 End stage renal disease: Secondary | ICD-10-CM | POA: Diagnosis not present

## 2021-06-30 DIAGNOSIS — Z992 Dependence on renal dialysis: Secondary | ICD-10-CM | POA: Diagnosis not present

## 2021-07-01 DIAGNOSIS — N186 End stage renal disease: Secondary | ICD-10-CM | POA: Diagnosis not present

## 2021-07-01 DIAGNOSIS — Z23 Encounter for immunization: Secondary | ICD-10-CM | POA: Diagnosis not present

## 2021-07-01 DIAGNOSIS — Z992 Dependence on renal dialysis: Secondary | ICD-10-CM | POA: Diagnosis not present

## 2021-07-02 DIAGNOSIS — N186 End stage renal disease: Secondary | ICD-10-CM | POA: Diagnosis not present

## 2021-07-02 DIAGNOSIS — Z23 Encounter for immunization: Secondary | ICD-10-CM | POA: Diagnosis not present

## 2021-07-02 DIAGNOSIS — Z992 Dependence on renal dialysis: Secondary | ICD-10-CM | POA: Diagnosis not present

## 2021-07-03 DIAGNOSIS — Z992 Dependence on renal dialysis: Secondary | ICD-10-CM | POA: Diagnosis not present

## 2021-07-03 DIAGNOSIS — Z23 Encounter for immunization: Secondary | ICD-10-CM | POA: Diagnosis not present

## 2021-07-03 DIAGNOSIS — N186 End stage renal disease: Secondary | ICD-10-CM | POA: Diagnosis not present

## 2021-07-04 DIAGNOSIS — N186 End stage renal disease: Secondary | ICD-10-CM | POA: Diagnosis not present

## 2021-07-04 DIAGNOSIS — Z992 Dependence on renal dialysis: Secondary | ICD-10-CM | POA: Diagnosis not present

## 2021-07-05 DIAGNOSIS — Z992 Dependence on renal dialysis: Secondary | ICD-10-CM | POA: Diagnosis not present

## 2021-07-05 DIAGNOSIS — N186 End stage renal disease: Secondary | ICD-10-CM | POA: Diagnosis not present

## 2021-07-06 DIAGNOSIS — Z992 Dependence on renal dialysis: Secondary | ICD-10-CM | POA: Diagnosis not present

## 2021-07-06 DIAGNOSIS — N186 End stage renal disease: Secondary | ICD-10-CM | POA: Diagnosis not present

## 2021-07-07 DIAGNOSIS — Z992 Dependence on renal dialysis: Secondary | ICD-10-CM | POA: Diagnosis not present

## 2021-07-07 DIAGNOSIS — N186 End stage renal disease: Secondary | ICD-10-CM | POA: Diagnosis not present

## 2021-07-08 DIAGNOSIS — N186 End stage renal disease: Secondary | ICD-10-CM | POA: Diagnosis not present

## 2021-07-08 DIAGNOSIS — Z992 Dependence on renal dialysis: Secondary | ICD-10-CM | POA: Diagnosis not present

## 2021-07-09 DIAGNOSIS — N186 End stage renal disease: Secondary | ICD-10-CM | POA: Diagnosis not present

## 2021-07-09 DIAGNOSIS — Z992 Dependence on renal dialysis: Secondary | ICD-10-CM | POA: Diagnosis not present

## 2021-07-10 DIAGNOSIS — Z992 Dependence on renal dialysis: Secondary | ICD-10-CM | POA: Diagnosis not present

## 2021-07-10 DIAGNOSIS — N186 End stage renal disease: Secondary | ICD-10-CM | POA: Diagnosis not present

## 2021-07-11 DIAGNOSIS — Z992 Dependence on renal dialysis: Secondary | ICD-10-CM | POA: Diagnosis not present

## 2021-07-11 DIAGNOSIS — D509 Iron deficiency anemia, unspecified: Secondary | ICD-10-CM | POA: Diagnosis not present

## 2021-07-11 DIAGNOSIS — N186 End stage renal disease: Secondary | ICD-10-CM | POA: Diagnosis not present

## 2021-07-12 DIAGNOSIS — N186 End stage renal disease: Secondary | ICD-10-CM | POA: Diagnosis not present

## 2021-07-12 DIAGNOSIS — Z992 Dependence on renal dialysis: Secondary | ICD-10-CM | POA: Diagnosis not present

## 2021-07-13 DIAGNOSIS — Z992 Dependence on renal dialysis: Secondary | ICD-10-CM | POA: Diagnosis not present

## 2021-07-13 DIAGNOSIS — N186 End stage renal disease: Secondary | ICD-10-CM | POA: Diagnosis not present

## 2021-07-14 DIAGNOSIS — Z992 Dependence on renal dialysis: Secondary | ICD-10-CM | POA: Diagnosis not present

## 2021-07-14 DIAGNOSIS — N186 End stage renal disease: Secondary | ICD-10-CM | POA: Diagnosis not present

## 2021-07-15 DIAGNOSIS — N186 End stage renal disease: Secondary | ICD-10-CM | POA: Diagnosis not present

## 2021-07-15 DIAGNOSIS — Z992 Dependence on renal dialysis: Secondary | ICD-10-CM | POA: Diagnosis not present

## 2021-07-16 DIAGNOSIS — N186 End stage renal disease: Secondary | ICD-10-CM | POA: Diagnosis not present

## 2021-07-16 DIAGNOSIS — Z992 Dependence on renal dialysis: Secondary | ICD-10-CM | POA: Diagnosis not present

## 2021-07-17 DIAGNOSIS — Z992 Dependence on renal dialysis: Secondary | ICD-10-CM | POA: Diagnosis not present

## 2021-07-17 DIAGNOSIS — Z136 Encounter for screening for cardiovascular disorders: Secondary | ICD-10-CM | POA: Diagnosis not present

## 2021-07-17 DIAGNOSIS — M3214 Glomerular disease in systemic lupus erythematosus: Secondary | ICD-10-CM | POA: Diagnosis not present

## 2021-07-17 DIAGNOSIS — I151 Hypertension secondary to other renal disorders: Secondary | ICD-10-CM | POA: Diagnosis not present

## 2021-07-17 DIAGNOSIS — N186 End stage renal disease: Secondary | ICD-10-CM | POA: Diagnosis not present

## 2021-07-18 ENCOUNTER — Other Ambulatory Visit (HOSPITAL_COMMUNITY)
Admission: RE | Admit: 2021-07-18 | Discharge: 2021-07-18 | Disposition: A | Discharge status: Home / Self Care | Payer: Medicare Other | Source: Ambulatory Visit | Attending: Family Medicine | Admitting: Family Medicine

## 2021-07-18 ENCOUNTER — Other Ambulatory Visit: Payer: Self-pay | Admitting: Family Medicine

## 2021-07-18 DIAGNOSIS — Z1151 Encounter for screening for human papillomavirus (HPV): Secondary | ICD-10-CM | POA: Diagnosis not present

## 2021-07-18 DIAGNOSIS — Z23 Encounter for immunization: Secondary | ICD-10-CM | POA: Diagnosis not present

## 2021-07-18 DIAGNOSIS — Z01411 Encounter for gynecological examination (general) (routine) with abnormal findings: Secondary | ICD-10-CM | POA: Diagnosis present

## 2021-07-18 DIAGNOSIS — Z992 Dependence on renal dialysis: Secondary | ICD-10-CM | POA: Diagnosis not present

## 2021-07-18 DIAGNOSIS — Z0000 Encounter for general adult medical examination without abnormal findings: Secondary | ICD-10-CM | POA: Diagnosis not present

## 2021-07-18 DIAGNOSIS — I151 Hypertension secondary to other renal disorders: Secondary | ICD-10-CM | POA: Diagnosis not present

## 2021-07-18 DIAGNOSIS — M3214 Glomerular disease in systemic lupus erythematosus: Secondary | ICD-10-CM | POA: Diagnosis not present

## 2021-07-18 DIAGNOSIS — Z13220 Encounter for screening for lipoid disorders: Secondary | ICD-10-CM | POA: Diagnosis not present

## 2021-07-18 DIAGNOSIS — N186 End stage renal disease: Secondary | ICD-10-CM | POA: Diagnosis not present

## 2021-07-18 DIAGNOSIS — Z1211 Encounter for screening for malignant neoplasm of colon: Secondary | ICD-10-CM | POA: Diagnosis not present

## 2021-07-19 DIAGNOSIS — N186 End stage renal disease: Secondary | ICD-10-CM | POA: Diagnosis not present

## 2021-07-19 DIAGNOSIS — Z992 Dependence on renal dialysis: Secondary | ICD-10-CM | POA: Diagnosis not present

## 2021-07-20 DIAGNOSIS — Z992 Dependence on renal dialysis: Secondary | ICD-10-CM | POA: Diagnosis not present

## 2021-07-20 DIAGNOSIS — N186 End stage renal disease: Secondary | ICD-10-CM | POA: Diagnosis not present

## 2021-07-21 DIAGNOSIS — N186 End stage renal disease: Secondary | ICD-10-CM | POA: Diagnosis not present

## 2021-07-21 DIAGNOSIS — Z992 Dependence on renal dialysis: Secondary | ICD-10-CM | POA: Diagnosis not present

## 2021-07-22 DIAGNOSIS — Z992 Dependence on renal dialysis: Secondary | ICD-10-CM | POA: Diagnosis not present

## 2021-07-22 DIAGNOSIS — N186 End stage renal disease: Secondary | ICD-10-CM | POA: Diagnosis not present

## 2021-07-23 DIAGNOSIS — N186 End stage renal disease: Secondary | ICD-10-CM | POA: Diagnosis not present

## 2021-07-23 DIAGNOSIS — Z992 Dependence on renal dialysis: Secondary | ICD-10-CM | POA: Diagnosis not present

## 2021-07-24 DIAGNOSIS — Z992 Dependence on renal dialysis: Secondary | ICD-10-CM | POA: Diagnosis not present

## 2021-07-24 DIAGNOSIS — N186 End stage renal disease: Secondary | ICD-10-CM | POA: Diagnosis not present

## 2021-07-24 LAB — CYTOLOGY - PAP
Comment: NEGATIVE
Diagnosis: NEGATIVE
Diagnosis: REACTIVE
High risk HPV: NEGATIVE

## 2021-07-25 DIAGNOSIS — N186 End stage renal disease: Secondary | ICD-10-CM | POA: Diagnosis not present

## 2021-07-25 DIAGNOSIS — Z992 Dependence on renal dialysis: Secondary | ICD-10-CM | POA: Diagnosis not present

## 2021-07-26 DIAGNOSIS — Z992 Dependence on renal dialysis: Secondary | ICD-10-CM | POA: Diagnosis not present

## 2021-07-26 DIAGNOSIS — N186 End stage renal disease: Secondary | ICD-10-CM | POA: Diagnosis not present

## 2021-07-27 DIAGNOSIS — N186 End stage renal disease: Secondary | ICD-10-CM | POA: Diagnosis not present

## 2021-07-27 DIAGNOSIS — Z992 Dependence on renal dialysis: Secondary | ICD-10-CM | POA: Diagnosis not present

## 2021-07-28 DIAGNOSIS — Z992 Dependence on renal dialysis: Secondary | ICD-10-CM | POA: Diagnosis not present

## 2021-07-28 DIAGNOSIS — N186 End stage renal disease: Secondary | ICD-10-CM | POA: Diagnosis not present

## 2021-07-29 DIAGNOSIS — N186 End stage renal disease: Secondary | ICD-10-CM | POA: Diagnosis not present

## 2021-07-29 DIAGNOSIS — Z992 Dependence on renal dialysis: Secondary | ICD-10-CM | POA: Diagnosis not present

## 2021-07-30 DIAGNOSIS — N186 End stage renal disease: Secondary | ICD-10-CM | POA: Diagnosis not present

## 2021-07-30 DIAGNOSIS — Z992 Dependence on renal dialysis: Secondary | ICD-10-CM | POA: Diagnosis not present

## 2021-07-31 DIAGNOSIS — N186 End stage renal disease: Secondary | ICD-10-CM | POA: Diagnosis not present

## 2021-07-31 DIAGNOSIS — Z992 Dependence on renal dialysis: Secondary | ICD-10-CM | POA: Diagnosis not present

## 2021-08-01 DIAGNOSIS — Z992 Dependence on renal dialysis: Secondary | ICD-10-CM | POA: Diagnosis not present

## 2021-08-01 DIAGNOSIS — N186 End stage renal disease: Secondary | ICD-10-CM | POA: Diagnosis not present

## 2021-08-02 DIAGNOSIS — Z992 Dependence on renal dialysis: Secondary | ICD-10-CM | POA: Diagnosis not present

## 2021-08-02 DIAGNOSIS — N186 End stage renal disease: Secondary | ICD-10-CM | POA: Diagnosis not present

## 2021-08-03 DIAGNOSIS — N186 End stage renal disease: Secondary | ICD-10-CM | POA: Diagnosis not present

## 2021-08-03 DIAGNOSIS — Z992 Dependence on renal dialysis: Secondary | ICD-10-CM | POA: Diagnosis not present

## 2021-08-04 DIAGNOSIS — Z992 Dependence on renal dialysis: Secondary | ICD-10-CM | POA: Diagnosis not present

## 2021-08-04 DIAGNOSIS — N186 End stage renal disease: Secondary | ICD-10-CM | POA: Diagnosis not present

## 2021-08-05 DIAGNOSIS — Z992 Dependence on renal dialysis: Secondary | ICD-10-CM | POA: Diagnosis not present

## 2021-08-05 DIAGNOSIS — N186 End stage renal disease: Secondary | ICD-10-CM | POA: Diagnosis not present

## 2021-08-06 DIAGNOSIS — Z992 Dependence on renal dialysis: Secondary | ICD-10-CM | POA: Diagnosis not present

## 2021-08-06 DIAGNOSIS — N186 End stage renal disease: Secondary | ICD-10-CM | POA: Diagnosis not present

## 2021-08-07 DIAGNOSIS — Z992 Dependence on renal dialysis: Secondary | ICD-10-CM | POA: Diagnosis not present

## 2021-08-07 DIAGNOSIS — N186 End stage renal disease: Secondary | ICD-10-CM | POA: Diagnosis not present

## 2021-08-08 DIAGNOSIS — Z992 Dependence on renal dialysis: Secondary | ICD-10-CM | POA: Diagnosis not present

## 2021-08-08 DIAGNOSIS — N186 End stage renal disease: Secondary | ICD-10-CM | POA: Diagnosis not present

## 2021-08-09 DIAGNOSIS — N186 End stage renal disease: Secondary | ICD-10-CM | POA: Diagnosis not present

## 2021-08-09 DIAGNOSIS — Z992 Dependence on renal dialysis: Secondary | ICD-10-CM | POA: Diagnosis not present

## 2021-08-10 DIAGNOSIS — Z992 Dependence on renal dialysis: Secondary | ICD-10-CM | POA: Diagnosis not present

## 2021-08-10 DIAGNOSIS — N186 End stage renal disease: Secondary | ICD-10-CM | POA: Diagnosis not present

## 2021-08-11 DIAGNOSIS — Z992 Dependence on renal dialysis: Secondary | ICD-10-CM | POA: Diagnosis not present

## 2021-08-11 DIAGNOSIS — N186 End stage renal disease: Secondary | ICD-10-CM | POA: Diagnosis not present

## 2021-08-12 DIAGNOSIS — Z992 Dependence on renal dialysis: Secondary | ICD-10-CM | POA: Diagnosis not present

## 2021-08-12 DIAGNOSIS — D509 Iron deficiency anemia, unspecified: Secondary | ICD-10-CM | POA: Diagnosis not present

## 2021-08-12 DIAGNOSIS — N186 End stage renal disease: Secondary | ICD-10-CM | POA: Diagnosis not present

## 2021-08-13 DIAGNOSIS — N186 End stage renal disease: Secondary | ICD-10-CM | POA: Diagnosis not present

## 2021-08-13 DIAGNOSIS — Z992 Dependence on renal dialysis: Secondary | ICD-10-CM | POA: Diagnosis not present

## 2021-08-14 DIAGNOSIS — N186 End stage renal disease: Secondary | ICD-10-CM | POA: Diagnosis not present

## 2021-08-14 DIAGNOSIS — Z992 Dependence on renal dialysis: Secondary | ICD-10-CM | POA: Diagnosis not present

## 2021-08-15 DIAGNOSIS — Z992 Dependence on renal dialysis: Secondary | ICD-10-CM | POA: Diagnosis not present

## 2021-08-15 DIAGNOSIS — N186 End stage renal disease: Secondary | ICD-10-CM | POA: Diagnosis not present

## 2021-08-16 DIAGNOSIS — N186 End stage renal disease: Secondary | ICD-10-CM | POA: Diagnosis not present

## 2021-08-16 DIAGNOSIS — Z992 Dependence on renal dialysis: Secondary | ICD-10-CM | POA: Diagnosis not present

## 2021-08-17 DIAGNOSIS — Z992 Dependence on renal dialysis: Secondary | ICD-10-CM | POA: Diagnosis not present

## 2021-08-17 DIAGNOSIS — N186 End stage renal disease: Secondary | ICD-10-CM | POA: Diagnosis not present

## 2021-08-18 DIAGNOSIS — N186 End stage renal disease: Secondary | ICD-10-CM | POA: Diagnosis not present

## 2021-08-18 DIAGNOSIS — Z992 Dependence on renal dialysis: Secondary | ICD-10-CM | POA: Diagnosis not present

## 2021-08-19 DIAGNOSIS — Z992 Dependence on renal dialysis: Secondary | ICD-10-CM | POA: Diagnosis not present

## 2021-08-19 DIAGNOSIS — N186 End stage renal disease: Secondary | ICD-10-CM | POA: Diagnosis not present

## 2021-08-20 DIAGNOSIS — N186 End stage renal disease: Secondary | ICD-10-CM | POA: Diagnosis not present

## 2021-08-20 DIAGNOSIS — Z992 Dependence on renal dialysis: Secondary | ICD-10-CM | POA: Diagnosis not present

## 2021-08-21 ENCOUNTER — Other Ambulatory Visit: Payer: Self-pay | Admitting: Family Medicine

## 2021-08-21 DIAGNOSIS — Z1231 Encounter for screening mammogram for malignant neoplasm of breast: Secondary | ICD-10-CM

## 2021-08-21 DIAGNOSIS — N186 End stage renal disease: Secondary | ICD-10-CM | POA: Diagnosis not present

## 2021-08-21 DIAGNOSIS — Z992 Dependence on renal dialysis: Secondary | ICD-10-CM | POA: Diagnosis not present

## 2021-08-22 DIAGNOSIS — N186 End stage renal disease: Secondary | ICD-10-CM | POA: Diagnosis not present

## 2021-08-22 DIAGNOSIS — Z992 Dependence on renal dialysis: Secondary | ICD-10-CM | POA: Diagnosis not present

## 2021-08-23 DIAGNOSIS — N186 End stage renal disease: Secondary | ICD-10-CM | POA: Diagnosis not present

## 2021-08-23 DIAGNOSIS — Z992 Dependence on renal dialysis: Secondary | ICD-10-CM | POA: Diagnosis not present

## 2021-08-24 DIAGNOSIS — N186 End stage renal disease: Secondary | ICD-10-CM | POA: Diagnosis not present

## 2021-08-24 DIAGNOSIS — Z992 Dependence on renal dialysis: Secondary | ICD-10-CM | POA: Diagnosis not present

## 2021-08-25 DIAGNOSIS — Z992 Dependence on renal dialysis: Secondary | ICD-10-CM | POA: Diagnosis not present

## 2021-08-25 DIAGNOSIS — N186 End stage renal disease: Secondary | ICD-10-CM | POA: Diagnosis not present

## 2021-08-26 DIAGNOSIS — N186 End stage renal disease: Secondary | ICD-10-CM | POA: Diagnosis not present

## 2021-08-26 DIAGNOSIS — Z992 Dependence on renal dialysis: Secondary | ICD-10-CM | POA: Diagnosis not present

## 2021-08-27 DIAGNOSIS — N186 End stage renal disease: Secondary | ICD-10-CM | POA: Diagnosis not present

## 2021-08-27 DIAGNOSIS — Z992 Dependence on renal dialysis: Secondary | ICD-10-CM | POA: Diagnosis not present

## 2021-08-28 DIAGNOSIS — N186 End stage renal disease: Secondary | ICD-10-CM | POA: Diagnosis not present

## 2021-08-28 DIAGNOSIS — Z992 Dependence on renal dialysis: Secondary | ICD-10-CM | POA: Diagnosis not present

## 2021-08-29 DIAGNOSIS — Z992 Dependence on renal dialysis: Secondary | ICD-10-CM | POA: Diagnosis not present

## 2021-08-29 DIAGNOSIS — N186 End stage renal disease: Secondary | ICD-10-CM | POA: Diagnosis not present

## 2021-08-30 DIAGNOSIS — Z992 Dependence on renal dialysis: Secondary | ICD-10-CM | POA: Diagnosis not present

## 2021-08-30 DIAGNOSIS — N186 End stage renal disease: Secondary | ICD-10-CM | POA: Diagnosis not present

## 2021-08-31 DIAGNOSIS — N186 End stage renal disease: Secondary | ICD-10-CM | POA: Diagnosis not present

## 2021-08-31 DIAGNOSIS — Z992 Dependence on renal dialysis: Secondary | ICD-10-CM | POA: Diagnosis not present

## 2021-09-01 DIAGNOSIS — Z992 Dependence on renal dialysis: Secondary | ICD-10-CM | POA: Diagnosis not present

## 2021-09-01 DIAGNOSIS — N186 End stage renal disease: Secondary | ICD-10-CM | POA: Diagnosis not present

## 2021-09-02 DIAGNOSIS — N186 End stage renal disease: Secondary | ICD-10-CM | POA: Diagnosis not present

## 2021-09-02 DIAGNOSIS — Z992 Dependence on renal dialysis: Secondary | ICD-10-CM | POA: Diagnosis not present

## 2021-09-03 DIAGNOSIS — N186 End stage renal disease: Secondary | ICD-10-CM | POA: Diagnosis not present

## 2021-09-03 DIAGNOSIS — Z992 Dependence on renal dialysis: Secondary | ICD-10-CM | POA: Diagnosis not present

## 2021-09-04 DIAGNOSIS — N186 End stage renal disease: Secondary | ICD-10-CM | POA: Diagnosis not present

## 2021-09-04 DIAGNOSIS — D509 Iron deficiency anemia, unspecified: Secondary | ICD-10-CM | POA: Diagnosis not present

## 2021-09-04 DIAGNOSIS — Z992 Dependence on renal dialysis: Secondary | ICD-10-CM | POA: Diagnosis not present

## 2021-09-05 DIAGNOSIS — Z992 Dependence on renal dialysis: Secondary | ICD-10-CM | POA: Diagnosis not present

## 2021-09-05 DIAGNOSIS — N186 End stage renal disease: Secondary | ICD-10-CM | POA: Diagnosis not present

## 2021-09-06 DIAGNOSIS — Z992 Dependence on renal dialysis: Secondary | ICD-10-CM | POA: Diagnosis not present

## 2021-09-06 DIAGNOSIS — N186 End stage renal disease: Secondary | ICD-10-CM | POA: Diagnosis not present

## 2021-09-07 DIAGNOSIS — Z992 Dependence on renal dialysis: Secondary | ICD-10-CM | POA: Diagnosis not present

## 2021-09-07 DIAGNOSIS — N186 End stage renal disease: Secondary | ICD-10-CM | POA: Diagnosis not present

## 2021-09-08 DIAGNOSIS — Z992 Dependence on renal dialysis: Secondary | ICD-10-CM | POA: Diagnosis not present

## 2021-09-08 DIAGNOSIS — N186 End stage renal disease: Secondary | ICD-10-CM | POA: Diagnosis not present

## 2021-09-09 DIAGNOSIS — Z992 Dependence on renal dialysis: Secondary | ICD-10-CM | POA: Diagnosis not present

## 2021-09-09 DIAGNOSIS — N186 End stage renal disease: Secondary | ICD-10-CM | POA: Diagnosis not present

## 2021-09-10 DIAGNOSIS — N186 End stage renal disease: Secondary | ICD-10-CM | POA: Diagnosis not present

## 2021-09-10 DIAGNOSIS — Z992 Dependence on renal dialysis: Secondary | ICD-10-CM | POA: Diagnosis not present

## 2021-09-11 DIAGNOSIS — Z992 Dependence on renal dialysis: Secondary | ICD-10-CM | POA: Diagnosis not present

## 2021-09-11 DIAGNOSIS — N186 End stage renal disease: Secondary | ICD-10-CM | POA: Diagnosis not present

## 2021-09-12 ENCOUNTER — Other Ambulatory Visit: Payer: Self-pay

## 2021-09-12 ENCOUNTER — Ambulatory Visit
Admission: RE | Admit: 2021-09-12 | Discharge: 2021-09-12 | Disposition: A | Discharge status: Home / Self Care | Payer: Medicare Other | Source: Ambulatory Visit | Attending: Family Medicine | Admitting: Family Medicine

## 2021-09-12 DIAGNOSIS — N186 End stage renal disease: Secondary | ICD-10-CM | POA: Diagnosis not present

## 2021-09-12 DIAGNOSIS — Z1231 Encounter for screening mammogram for malignant neoplasm of breast: Secondary | ICD-10-CM | POA: Diagnosis not present

## 2021-09-12 DIAGNOSIS — Z992 Dependence on renal dialysis: Secondary | ICD-10-CM | POA: Diagnosis not present

## 2021-09-13 DIAGNOSIS — Z992 Dependence on renal dialysis: Secondary | ICD-10-CM | POA: Diagnosis not present

## 2021-09-13 DIAGNOSIS — N186 End stage renal disease: Secondary | ICD-10-CM | POA: Diagnosis not present

## 2021-09-14 DIAGNOSIS — Z992 Dependence on renal dialysis: Secondary | ICD-10-CM | POA: Diagnosis not present

## 2021-09-14 DIAGNOSIS — N186 End stage renal disease: Secondary | ICD-10-CM | POA: Diagnosis not present

## 2021-09-15 DIAGNOSIS — Z992 Dependence on renal dialysis: Secondary | ICD-10-CM | POA: Diagnosis not present

## 2021-09-15 DIAGNOSIS — N186 End stage renal disease: Secondary | ICD-10-CM | POA: Diagnosis not present

## 2021-09-16 DIAGNOSIS — Z992 Dependence on renal dialysis: Secondary | ICD-10-CM | POA: Diagnosis not present

## 2021-09-16 DIAGNOSIS — N186 End stage renal disease: Secondary | ICD-10-CM | POA: Diagnosis not present

## 2021-09-17 DIAGNOSIS — Z992 Dependence on renal dialysis: Secondary | ICD-10-CM | POA: Diagnosis not present

## 2021-09-17 DIAGNOSIS — N186 End stage renal disease: Secondary | ICD-10-CM | POA: Diagnosis not present

## 2021-09-18 DIAGNOSIS — N186 End stage renal disease: Secondary | ICD-10-CM | POA: Diagnosis not present

## 2021-09-18 DIAGNOSIS — Z992 Dependence on renal dialysis: Secondary | ICD-10-CM | POA: Diagnosis not present

## 2021-09-19 DIAGNOSIS — Z992 Dependence on renal dialysis: Secondary | ICD-10-CM | POA: Diagnosis not present

## 2021-09-19 DIAGNOSIS — N186 End stage renal disease: Secondary | ICD-10-CM | POA: Diagnosis not present

## 2021-09-20 DIAGNOSIS — N186 End stage renal disease: Secondary | ICD-10-CM | POA: Diagnosis not present

## 2021-09-20 DIAGNOSIS — Z992 Dependence on renal dialysis: Secondary | ICD-10-CM | POA: Diagnosis not present

## 2021-09-21 DIAGNOSIS — N186 End stage renal disease: Secondary | ICD-10-CM | POA: Diagnosis not present

## 2021-09-21 DIAGNOSIS — Z992 Dependence on renal dialysis: Secondary | ICD-10-CM | POA: Diagnosis not present

## 2021-09-22 DIAGNOSIS — Z992 Dependence on renal dialysis: Secondary | ICD-10-CM | POA: Diagnosis not present

## 2021-09-22 DIAGNOSIS — N186 End stage renal disease: Secondary | ICD-10-CM | POA: Diagnosis not present

## 2021-09-23 DIAGNOSIS — Z992 Dependence on renal dialysis: Secondary | ICD-10-CM | POA: Diagnosis not present

## 2021-09-23 DIAGNOSIS — N186 End stage renal disease: Secondary | ICD-10-CM | POA: Diagnosis not present

## 2021-09-24 DIAGNOSIS — N186 End stage renal disease: Secondary | ICD-10-CM | POA: Diagnosis not present

## 2021-09-24 DIAGNOSIS — Z992 Dependence on renal dialysis: Secondary | ICD-10-CM | POA: Diagnosis not present

## 2021-09-25 DIAGNOSIS — N186 End stage renal disease: Secondary | ICD-10-CM | POA: Diagnosis not present

## 2021-09-25 DIAGNOSIS — Z992 Dependence on renal dialysis: Secondary | ICD-10-CM | POA: Diagnosis not present

## 2021-09-26 DIAGNOSIS — Z992 Dependence on renal dialysis: Secondary | ICD-10-CM | POA: Diagnosis not present

## 2021-09-26 DIAGNOSIS — N186 End stage renal disease: Secondary | ICD-10-CM | POA: Diagnosis not present

## 2021-09-27 DIAGNOSIS — N186 End stage renal disease: Secondary | ICD-10-CM | POA: Diagnosis not present

## 2021-09-27 DIAGNOSIS — Z992 Dependence on renal dialysis: Secondary | ICD-10-CM | POA: Diagnosis not present

## 2021-09-28 DIAGNOSIS — Z992 Dependence on renal dialysis: Secondary | ICD-10-CM | POA: Diagnosis not present

## 2021-09-28 DIAGNOSIS — N186 End stage renal disease: Secondary | ICD-10-CM | POA: Diagnosis not present

## 2021-09-29 DIAGNOSIS — N186 End stage renal disease: Secondary | ICD-10-CM | POA: Diagnosis not present

## 2021-09-29 DIAGNOSIS — Z992 Dependence on renal dialysis: Secondary | ICD-10-CM | POA: Diagnosis not present

## 2021-09-30 DIAGNOSIS — N186 End stage renal disease: Secondary | ICD-10-CM | POA: Diagnosis not present

## 2021-09-30 DIAGNOSIS — Z992 Dependence on renal dialysis: Secondary | ICD-10-CM | POA: Diagnosis not present

## 2021-10-01 DIAGNOSIS — N186 End stage renal disease: Secondary | ICD-10-CM | POA: Diagnosis not present

## 2021-10-01 DIAGNOSIS — Z992 Dependence on renal dialysis: Secondary | ICD-10-CM | POA: Diagnosis not present

## 2021-10-02 DIAGNOSIS — N186 End stage renal disease: Secondary | ICD-10-CM | POA: Diagnosis not present

## 2021-10-02 DIAGNOSIS — Z992 Dependence on renal dialysis: Secondary | ICD-10-CM | POA: Diagnosis not present

## 2021-10-03 DIAGNOSIS — N186 End stage renal disease: Secondary | ICD-10-CM | POA: Diagnosis not present

## 2021-10-03 DIAGNOSIS — Z992 Dependence on renal dialysis: Secondary | ICD-10-CM | POA: Diagnosis not present

## 2021-10-04 DIAGNOSIS — Z992 Dependence on renal dialysis: Secondary | ICD-10-CM | POA: Diagnosis not present

## 2021-10-04 DIAGNOSIS — N186 End stage renal disease: Secondary | ICD-10-CM | POA: Diagnosis not present

## 2021-10-05 DIAGNOSIS — N186 End stage renal disease: Secondary | ICD-10-CM | POA: Diagnosis not present

## 2021-10-05 DIAGNOSIS — Z992 Dependence on renal dialysis: Secondary | ICD-10-CM | POA: Diagnosis not present

## 2021-10-07 DIAGNOSIS — Z992 Dependence on renal dialysis: Secondary | ICD-10-CM | POA: Diagnosis not present

## 2021-10-07 DIAGNOSIS — N186 End stage renal disease: Secondary | ICD-10-CM | POA: Diagnosis not present

## 2021-10-08 DIAGNOSIS — N186 End stage renal disease: Secondary | ICD-10-CM | POA: Diagnosis not present

## 2021-10-08 DIAGNOSIS — Z992 Dependence on renal dialysis: Secondary | ICD-10-CM | POA: Diagnosis not present

## 2021-10-09 DIAGNOSIS — N186 End stage renal disease: Secondary | ICD-10-CM | POA: Diagnosis not present

## 2021-10-09 DIAGNOSIS — D509 Iron deficiency anemia, unspecified: Secondary | ICD-10-CM | POA: Diagnosis not present

## 2021-10-09 DIAGNOSIS — Z992 Dependence on renal dialysis: Secondary | ICD-10-CM | POA: Diagnosis not present

## 2021-10-10 DIAGNOSIS — N186 End stage renal disease: Secondary | ICD-10-CM | POA: Diagnosis not present

## 2021-10-10 DIAGNOSIS — Z992 Dependence on renal dialysis: Secondary | ICD-10-CM | POA: Diagnosis not present

## 2021-10-11 DIAGNOSIS — Z992 Dependence on renal dialysis: Secondary | ICD-10-CM | POA: Diagnosis not present

## 2021-10-11 DIAGNOSIS — N186 End stage renal disease: Secondary | ICD-10-CM | POA: Diagnosis not present

## 2021-10-12 DIAGNOSIS — N186 End stage renal disease: Secondary | ICD-10-CM | POA: Diagnosis not present

## 2021-10-12 DIAGNOSIS — Z992 Dependence on renal dialysis: Secondary | ICD-10-CM | POA: Diagnosis not present

## 2021-10-13 DIAGNOSIS — Z992 Dependence on renal dialysis: Secondary | ICD-10-CM | POA: Diagnosis not present

## 2021-10-13 DIAGNOSIS — N186 End stage renal disease: Secondary | ICD-10-CM | POA: Diagnosis not present

## 2021-10-14 DIAGNOSIS — Z992 Dependence on renal dialysis: Secondary | ICD-10-CM | POA: Diagnosis not present

## 2021-10-14 DIAGNOSIS — N186 End stage renal disease: Secondary | ICD-10-CM | POA: Diagnosis not present

## 2021-10-15 DIAGNOSIS — Z992 Dependence on renal dialysis: Secondary | ICD-10-CM | POA: Diagnosis not present

## 2021-10-15 DIAGNOSIS — N186 End stage renal disease: Secondary | ICD-10-CM | POA: Diagnosis not present

## 2021-10-16 DIAGNOSIS — Z992 Dependence on renal dialysis: Secondary | ICD-10-CM | POA: Diagnosis not present

## 2021-10-16 DIAGNOSIS — N186 End stage renal disease: Secondary | ICD-10-CM | POA: Diagnosis not present

## 2021-10-17 DIAGNOSIS — N186 End stage renal disease: Secondary | ICD-10-CM | POA: Diagnosis not present

## 2021-10-17 DIAGNOSIS — Z992 Dependence on renal dialysis: Secondary | ICD-10-CM | POA: Diagnosis not present

## 2021-10-18 DIAGNOSIS — N186 End stage renal disease: Secondary | ICD-10-CM | POA: Diagnosis not present

## 2021-10-18 DIAGNOSIS — Z992 Dependence on renal dialysis: Secondary | ICD-10-CM | POA: Diagnosis not present

## 2021-10-19 DIAGNOSIS — N186 End stage renal disease: Secondary | ICD-10-CM | POA: Diagnosis not present

## 2021-10-19 DIAGNOSIS — Z992 Dependence on renal dialysis: Secondary | ICD-10-CM | POA: Diagnosis not present

## 2021-10-20 DIAGNOSIS — Z992 Dependence on renal dialysis: Secondary | ICD-10-CM | POA: Diagnosis not present

## 2021-10-20 DIAGNOSIS — N186 End stage renal disease: Secondary | ICD-10-CM | POA: Diagnosis not present

## 2021-10-21 DIAGNOSIS — Z992 Dependence on renal dialysis: Secondary | ICD-10-CM | POA: Diagnosis not present

## 2021-10-21 DIAGNOSIS — N186 End stage renal disease: Secondary | ICD-10-CM | POA: Diagnosis not present

## 2021-10-22 DIAGNOSIS — Z992 Dependence on renal dialysis: Secondary | ICD-10-CM | POA: Diagnosis not present

## 2021-10-22 DIAGNOSIS — N186 End stage renal disease: Secondary | ICD-10-CM | POA: Diagnosis not present

## 2021-10-23 DIAGNOSIS — N186 End stage renal disease: Secondary | ICD-10-CM | POA: Diagnosis not present

## 2021-10-23 DIAGNOSIS — Z992 Dependence on renal dialysis: Secondary | ICD-10-CM | POA: Diagnosis not present

## 2021-10-24 DIAGNOSIS — Z992 Dependence on renal dialysis: Secondary | ICD-10-CM | POA: Diagnosis not present

## 2021-10-24 DIAGNOSIS — N186 End stage renal disease: Secondary | ICD-10-CM | POA: Diagnosis not present

## 2021-10-25 DIAGNOSIS — Z992 Dependence on renal dialysis: Secondary | ICD-10-CM | POA: Diagnosis not present

## 2021-10-25 DIAGNOSIS — N186 End stage renal disease: Secondary | ICD-10-CM | POA: Diagnosis not present

## 2021-10-26 DIAGNOSIS — N186 End stage renal disease: Secondary | ICD-10-CM | POA: Diagnosis not present

## 2021-10-26 DIAGNOSIS — Z992 Dependence on renal dialysis: Secondary | ICD-10-CM | POA: Diagnosis not present

## 2021-10-27 DIAGNOSIS — Z992 Dependence on renal dialysis: Secondary | ICD-10-CM | POA: Diagnosis not present

## 2021-10-27 DIAGNOSIS — N186 End stage renal disease: Secondary | ICD-10-CM | POA: Diagnosis not present

## 2021-10-28 DIAGNOSIS — Z992 Dependence on renal dialysis: Secondary | ICD-10-CM | POA: Diagnosis not present

## 2021-10-28 DIAGNOSIS — N186 End stage renal disease: Secondary | ICD-10-CM | POA: Diagnosis not present

## 2021-10-29 DIAGNOSIS — N186 End stage renal disease: Secondary | ICD-10-CM | POA: Diagnosis not present

## 2021-10-29 DIAGNOSIS — Z992 Dependence on renal dialysis: Secondary | ICD-10-CM | POA: Diagnosis not present

## 2021-10-30 DIAGNOSIS — Z992 Dependence on renal dialysis: Secondary | ICD-10-CM | POA: Diagnosis not present

## 2021-10-30 DIAGNOSIS — N186 End stage renal disease: Secondary | ICD-10-CM | POA: Diagnosis not present

## 2021-10-31 DIAGNOSIS — Z992 Dependence on renal dialysis: Secondary | ICD-10-CM | POA: Diagnosis not present

## 2021-10-31 DIAGNOSIS — N186 End stage renal disease: Secondary | ICD-10-CM | POA: Diagnosis not present

## 2021-11-01 DIAGNOSIS — N186 End stage renal disease: Secondary | ICD-10-CM | POA: Diagnosis not present

## 2021-11-01 DIAGNOSIS — Z992 Dependence on renal dialysis: Secondary | ICD-10-CM | POA: Diagnosis not present

## 2021-11-02 DIAGNOSIS — N186 End stage renal disease: Secondary | ICD-10-CM | POA: Diagnosis not present

## 2021-11-02 DIAGNOSIS — Z992 Dependence on renal dialysis: Secondary | ICD-10-CM | POA: Diagnosis not present

## 2021-11-03 DIAGNOSIS — Z992 Dependence on renal dialysis: Secondary | ICD-10-CM | POA: Diagnosis not present

## 2021-11-03 DIAGNOSIS — N186 End stage renal disease: Secondary | ICD-10-CM | POA: Diagnosis not present

## 2021-11-03 HISTORY — PX: COLONOSCOPY: SHX174

## 2021-11-04 DIAGNOSIS — N186 End stage renal disease: Secondary | ICD-10-CM | POA: Diagnosis not present

## 2021-11-04 DIAGNOSIS — Z992 Dependence on renal dialysis: Secondary | ICD-10-CM | POA: Diagnosis not present

## 2021-11-05 DIAGNOSIS — Z992 Dependence on renal dialysis: Secondary | ICD-10-CM | POA: Diagnosis not present

## 2021-11-05 DIAGNOSIS — N186 End stage renal disease: Secondary | ICD-10-CM | POA: Diagnosis not present

## 2021-11-06 DIAGNOSIS — Z992 Dependence on renal dialysis: Secondary | ICD-10-CM | POA: Diagnosis not present

## 2021-11-06 DIAGNOSIS — N186 End stage renal disease: Secondary | ICD-10-CM | POA: Diagnosis not present

## 2021-11-07 DIAGNOSIS — D509 Iron deficiency anemia, unspecified: Secondary | ICD-10-CM | POA: Diagnosis not present

## 2021-11-07 DIAGNOSIS — Z992 Dependence on renal dialysis: Secondary | ICD-10-CM | POA: Diagnosis not present

## 2021-11-07 DIAGNOSIS — N186 End stage renal disease: Secondary | ICD-10-CM | POA: Diagnosis not present

## 2021-11-08 DIAGNOSIS — D124 Benign neoplasm of descending colon: Secondary | ICD-10-CM | POA: Diagnosis not present

## 2021-11-08 DIAGNOSIS — Z1211 Encounter for screening for malignant neoplasm of colon: Secondary | ICD-10-CM | POA: Diagnosis not present

## 2021-11-08 DIAGNOSIS — K648 Other hemorrhoids: Secondary | ICD-10-CM | POA: Diagnosis not present

## 2021-11-08 DIAGNOSIS — K5730 Diverticulosis of large intestine without perforation or abscess without bleeding: Secondary | ICD-10-CM | POA: Diagnosis not present

## 2021-11-08 DIAGNOSIS — D122 Benign neoplasm of ascending colon: Secondary | ICD-10-CM | POA: Diagnosis not present

## 2021-11-09 DIAGNOSIS — N186 End stage renal disease: Secondary | ICD-10-CM | POA: Diagnosis not present

## 2021-11-09 DIAGNOSIS — Z992 Dependence on renal dialysis: Secondary | ICD-10-CM | POA: Diagnosis not present

## 2021-11-10 DIAGNOSIS — Z992 Dependence on renal dialysis: Secondary | ICD-10-CM | POA: Diagnosis not present

## 2021-11-10 DIAGNOSIS — N186 End stage renal disease: Secondary | ICD-10-CM | POA: Diagnosis not present

## 2021-11-11 DIAGNOSIS — Z992 Dependence on renal dialysis: Secondary | ICD-10-CM | POA: Diagnosis not present

## 2021-11-11 DIAGNOSIS — N186 End stage renal disease: Secondary | ICD-10-CM | POA: Diagnosis not present

## 2021-11-12 DIAGNOSIS — N186 End stage renal disease: Secondary | ICD-10-CM | POA: Diagnosis not present

## 2021-11-12 DIAGNOSIS — Z992 Dependence on renal dialysis: Secondary | ICD-10-CM | POA: Diagnosis not present

## 2021-11-12 DIAGNOSIS — D124 Benign neoplasm of descending colon: Secondary | ICD-10-CM | POA: Diagnosis not present

## 2021-11-12 DIAGNOSIS — D122 Benign neoplasm of ascending colon: Secondary | ICD-10-CM | POA: Diagnosis not present

## 2021-11-14 DIAGNOSIS — Z992 Dependence on renal dialysis: Secondary | ICD-10-CM | POA: Diagnosis not present

## 2021-11-14 DIAGNOSIS — N186 End stage renal disease: Secondary | ICD-10-CM | POA: Diagnosis not present

## 2021-11-15 DIAGNOSIS — Z992 Dependence on renal dialysis: Secondary | ICD-10-CM | POA: Diagnosis not present

## 2021-11-15 DIAGNOSIS — N186 End stage renal disease: Secondary | ICD-10-CM | POA: Diagnosis not present

## 2021-11-16 DIAGNOSIS — Z992 Dependence on renal dialysis: Secondary | ICD-10-CM | POA: Diagnosis not present

## 2021-11-16 DIAGNOSIS — N186 End stage renal disease: Secondary | ICD-10-CM | POA: Diagnosis not present

## 2021-11-17 DIAGNOSIS — N186 End stage renal disease: Secondary | ICD-10-CM | POA: Diagnosis not present

## 2021-11-17 DIAGNOSIS — Z992 Dependence on renal dialysis: Secondary | ICD-10-CM | POA: Diagnosis not present

## 2021-11-18 DIAGNOSIS — Z992 Dependence on renal dialysis: Secondary | ICD-10-CM | POA: Diagnosis not present

## 2021-11-18 DIAGNOSIS — N186 End stage renal disease: Secondary | ICD-10-CM | POA: Diagnosis not present

## 2021-11-19 DIAGNOSIS — N186 End stage renal disease: Secondary | ICD-10-CM | POA: Diagnosis not present

## 2021-11-19 DIAGNOSIS — Z992 Dependence on renal dialysis: Secondary | ICD-10-CM | POA: Diagnosis not present

## 2021-11-20 DIAGNOSIS — M3210 Systemic lupus erythematosus, organ or system involvement unspecified: Secondary | ICD-10-CM | POA: Diagnosis not present

## 2021-11-20 DIAGNOSIS — N186 End stage renal disease: Secondary | ICD-10-CM | POA: Diagnosis not present

## 2021-11-20 DIAGNOSIS — Z79899 Other long term (current) drug therapy: Secondary | ICD-10-CM | POA: Diagnosis not present

## 2021-11-20 DIAGNOSIS — Z992 Dependence on renal dialysis: Secondary | ICD-10-CM | POA: Diagnosis not present

## 2021-11-20 DIAGNOSIS — H353131 Nonexudative age-related macular degeneration, bilateral, early dry stage: Secondary | ICD-10-CM | POA: Diagnosis not present

## 2021-11-21 DIAGNOSIS — Z992 Dependence on renal dialysis: Secondary | ICD-10-CM | POA: Diagnosis not present

## 2021-11-21 DIAGNOSIS — N186 End stage renal disease: Secondary | ICD-10-CM | POA: Diagnosis not present

## 2021-11-22 DIAGNOSIS — Z992 Dependence on renal dialysis: Secondary | ICD-10-CM | POA: Diagnosis not present

## 2021-11-22 DIAGNOSIS — N186 End stage renal disease: Secondary | ICD-10-CM | POA: Diagnosis not present

## 2021-11-23 DIAGNOSIS — N186 End stage renal disease: Secondary | ICD-10-CM | POA: Diagnosis not present

## 2021-11-23 DIAGNOSIS — Z992 Dependence on renal dialysis: Secondary | ICD-10-CM | POA: Diagnosis not present

## 2021-11-24 DIAGNOSIS — Z992 Dependence on renal dialysis: Secondary | ICD-10-CM | POA: Diagnosis not present

## 2021-11-24 DIAGNOSIS — N186 End stage renal disease: Secondary | ICD-10-CM | POA: Diagnosis not present

## 2021-11-25 DIAGNOSIS — N186 End stage renal disease: Secondary | ICD-10-CM | POA: Diagnosis not present

## 2021-11-25 DIAGNOSIS — Z992 Dependence on renal dialysis: Secondary | ICD-10-CM | POA: Diagnosis not present

## 2021-11-26 DIAGNOSIS — Z992 Dependence on renal dialysis: Secondary | ICD-10-CM | POA: Diagnosis not present

## 2021-11-26 DIAGNOSIS — N186 End stage renal disease: Secondary | ICD-10-CM | POA: Diagnosis not present

## 2021-11-27 DIAGNOSIS — N186 End stage renal disease: Secondary | ICD-10-CM | POA: Diagnosis not present

## 2021-11-27 DIAGNOSIS — Z992 Dependence on renal dialysis: Secondary | ICD-10-CM | POA: Diagnosis not present

## 2021-11-28 DIAGNOSIS — N186 End stage renal disease: Secondary | ICD-10-CM | POA: Diagnosis not present

## 2021-11-28 DIAGNOSIS — Z992 Dependence on renal dialysis: Secondary | ICD-10-CM | POA: Diagnosis not present

## 2021-11-29 DIAGNOSIS — N186 End stage renal disease: Secondary | ICD-10-CM | POA: Diagnosis not present

## 2021-11-29 DIAGNOSIS — Z992 Dependence on renal dialysis: Secondary | ICD-10-CM | POA: Diagnosis not present

## 2021-11-30 DIAGNOSIS — Z992 Dependence on renal dialysis: Secondary | ICD-10-CM | POA: Diagnosis not present

## 2021-11-30 DIAGNOSIS — N186 End stage renal disease: Secondary | ICD-10-CM | POA: Diagnosis not present

## 2021-12-01 DIAGNOSIS — Z992 Dependence on renal dialysis: Secondary | ICD-10-CM | POA: Diagnosis not present

## 2021-12-01 DIAGNOSIS — N186 End stage renal disease: Secondary | ICD-10-CM | POA: Diagnosis not present

## 2021-12-02 DIAGNOSIS — N186 End stage renal disease: Secondary | ICD-10-CM | POA: Diagnosis not present

## 2021-12-02 DIAGNOSIS — Z992 Dependence on renal dialysis: Secondary | ICD-10-CM | POA: Diagnosis not present

## 2021-12-03 DIAGNOSIS — Z992 Dependence on renal dialysis: Secondary | ICD-10-CM | POA: Diagnosis not present

## 2021-12-03 DIAGNOSIS — N186 End stage renal disease: Secondary | ICD-10-CM | POA: Diagnosis not present

## 2021-12-04 DIAGNOSIS — Z992 Dependence on renal dialysis: Secondary | ICD-10-CM | POA: Diagnosis not present

## 2021-12-04 DIAGNOSIS — N186 End stage renal disease: Secondary | ICD-10-CM | POA: Diagnosis not present

## 2021-12-05 DIAGNOSIS — Z992 Dependence on renal dialysis: Secondary | ICD-10-CM | POA: Diagnosis not present

## 2021-12-05 DIAGNOSIS — N186 End stage renal disease: Secondary | ICD-10-CM | POA: Diagnosis not present

## 2021-12-06 DIAGNOSIS — N186 End stage renal disease: Secondary | ICD-10-CM | POA: Diagnosis not present

## 2021-12-06 DIAGNOSIS — Z992 Dependence on renal dialysis: Secondary | ICD-10-CM | POA: Diagnosis not present

## 2021-12-07 DIAGNOSIS — Z992 Dependence on renal dialysis: Secondary | ICD-10-CM | POA: Diagnosis not present

## 2021-12-07 DIAGNOSIS — N186 End stage renal disease: Secondary | ICD-10-CM | POA: Diagnosis not present

## 2021-12-08 DIAGNOSIS — N186 End stage renal disease: Secondary | ICD-10-CM | POA: Diagnosis not present

## 2021-12-08 DIAGNOSIS — Z992 Dependence on renal dialysis: Secondary | ICD-10-CM | POA: Diagnosis not present

## 2021-12-09 DIAGNOSIS — Z992 Dependence on renal dialysis: Secondary | ICD-10-CM | POA: Diagnosis not present

## 2021-12-09 DIAGNOSIS — N186 End stage renal disease: Secondary | ICD-10-CM | POA: Diagnosis not present

## 2021-12-10 DIAGNOSIS — N186 End stage renal disease: Secondary | ICD-10-CM | POA: Diagnosis not present

## 2021-12-10 DIAGNOSIS — Z992 Dependence on renal dialysis: Secondary | ICD-10-CM | POA: Diagnosis not present

## 2021-12-11 DIAGNOSIS — N186 End stage renal disease: Secondary | ICD-10-CM | POA: Diagnosis not present

## 2021-12-11 DIAGNOSIS — Z992 Dependence on renal dialysis: Secondary | ICD-10-CM | POA: Diagnosis not present

## 2021-12-12 DIAGNOSIS — N186 End stage renal disease: Secondary | ICD-10-CM | POA: Diagnosis not present

## 2021-12-12 DIAGNOSIS — Z992 Dependence on renal dialysis: Secondary | ICD-10-CM | POA: Diagnosis not present

## 2021-12-13 DIAGNOSIS — N186 End stage renal disease: Secondary | ICD-10-CM | POA: Diagnosis not present

## 2021-12-13 DIAGNOSIS — Z992 Dependence on renal dialysis: Secondary | ICD-10-CM | POA: Diagnosis not present

## 2021-12-14 DIAGNOSIS — Z992 Dependence on renal dialysis: Secondary | ICD-10-CM | POA: Diagnosis not present

## 2021-12-14 DIAGNOSIS — N186 End stage renal disease: Secondary | ICD-10-CM | POA: Diagnosis not present

## 2021-12-15 DIAGNOSIS — N186 End stage renal disease: Secondary | ICD-10-CM | POA: Diagnosis not present

## 2021-12-15 DIAGNOSIS — Z992 Dependence on renal dialysis: Secondary | ICD-10-CM | POA: Diagnosis not present

## 2021-12-16 DIAGNOSIS — N186 End stage renal disease: Secondary | ICD-10-CM | POA: Diagnosis not present

## 2021-12-16 DIAGNOSIS — Z992 Dependence on renal dialysis: Secondary | ICD-10-CM | POA: Diagnosis not present

## 2021-12-17 DIAGNOSIS — Z992 Dependence on renal dialysis: Secondary | ICD-10-CM | POA: Diagnosis not present

## 2021-12-17 DIAGNOSIS — N186 End stage renal disease: Secondary | ICD-10-CM | POA: Diagnosis not present

## 2021-12-18 DIAGNOSIS — N186 End stage renal disease: Secondary | ICD-10-CM | POA: Diagnosis not present

## 2021-12-18 DIAGNOSIS — Z992 Dependence on renal dialysis: Secondary | ICD-10-CM | POA: Diagnosis not present

## 2021-12-19 DIAGNOSIS — N186 End stage renal disease: Secondary | ICD-10-CM | POA: Diagnosis not present

## 2021-12-19 DIAGNOSIS — Z992 Dependence on renal dialysis: Secondary | ICD-10-CM | POA: Diagnosis not present

## 2021-12-20 DIAGNOSIS — N186 End stage renal disease: Secondary | ICD-10-CM | POA: Diagnosis not present

## 2021-12-20 DIAGNOSIS — Z992 Dependence on renal dialysis: Secondary | ICD-10-CM | POA: Diagnosis not present

## 2021-12-21 DIAGNOSIS — Z992 Dependence on renal dialysis: Secondary | ICD-10-CM | POA: Diagnosis not present

## 2021-12-21 DIAGNOSIS — N186 End stage renal disease: Secondary | ICD-10-CM | POA: Diagnosis not present

## 2021-12-22 DIAGNOSIS — N186 End stage renal disease: Secondary | ICD-10-CM | POA: Diagnosis not present

## 2021-12-22 DIAGNOSIS — Z992 Dependence on renal dialysis: Secondary | ICD-10-CM | POA: Diagnosis not present

## 2021-12-23 DIAGNOSIS — Z992 Dependence on renal dialysis: Secondary | ICD-10-CM | POA: Diagnosis not present

## 2021-12-23 DIAGNOSIS — N186 End stage renal disease: Secondary | ICD-10-CM | POA: Diagnosis not present

## 2021-12-24 DIAGNOSIS — Z992 Dependence on renal dialysis: Secondary | ICD-10-CM | POA: Diagnosis not present

## 2021-12-24 DIAGNOSIS — N186 End stage renal disease: Secondary | ICD-10-CM | POA: Diagnosis not present

## 2021-12-25 DIAGNOSIS — N186 End stage renal disease: Secondary | ICD-10-CM | POA: Diagnosis not present

## 2021-12-25 DIAGNOSIS — Z992 Dependence on renal dialysis: Secondary | ICD-10-CM | POA: Diagnosis not present

## 2021-12-26 DIAGNOSIS — N186 End stage renal disease: Secondary | ICD-10-CM | POA: Diagnosis not present

## 2021-12-26 DIAGNOSIS — Z992 Dependence on renal dialysis: Secondary | ICD-10-CM | POA: Diagnosis not present

## 2021-12-27 DIAGNOSIS — N186 End stage renal disease: Secondary | ICD-10-CM | POA: Diagnosis not present

## 2021-12-27 DIAGNOSIS — Z992 Dependence on renal dialysis: Secondary | ICD-10-CM | POA: Diagnosis not present

## 2021-12-28 DIAGNOSIS — N186 End stage renal disease: Secondary | ICD-10-CM | POA: Diagnosis not present

## 2021-12-28 DIAGNOSIS — Z992 Dependence on renal dialysis: Secondary | ICD-10-CM | POA: Diagnosis not present

## 2021-12-29 DIAGNOSIS — Z992 Dependence on renal dialysis: Secondary | ICD-10-CM | POA: Diagnosis not present

## 2021-12-29 DIAGNOSIS — N186 End stage renal disease: Secondary | ICD-10-CM | POA: Diagnosis not present

## 2021-12-30 DIAGNOSIS — N186 End stage renal disease: Secondary | ICD-10-CM | POA: Diagnosis not present

## 2021-12-30 DIAGNOSIS — Z992 Dependence on renal dialysis: Secondary | ICD-10-CM | POA: Diagnosis not present

## 2021-12-31 DIAGNOSIS — N186 End stage renal disease: Secondary | ICD-10-CM | POA: Diagnosis not present

## 2021-12-31 DIAGNOSIS — Z992 Dependence on renal dialysis: Secondary | ICD-10-CM | POA: Diagnosis not present

## 2022-01-01 DIAGNOSIS — N186 End stage renal disease: Secondary | ICD-10-CM | POA: Diagnosis not present

## 2022-01-01 DIAGNOSIS — Z992 Dependence on renal dialysis: Secondary | ICD-10-CM | POA: Diagnosis not present

## 2022-01-02 DIAGNOSIS — Z992 Dependence on renal dialysis: Secondary | ICD-10-CM | POA: Diagnosis not present

## 2022-01-02 DIAGNOSIS — N186 End stage renal disease: Secondary | ICD-10-CM | POA: Diagnosis not present

## 2022-01-03 DIAGNOSIS — Z992 Dependence on renal dialysis: Secondary | ICD-10-CM | POA: Diagnosis not present

## 2022-01-03 DIAGNOSIS — N186 End stage renal disease: Secondary | ICD-10-CM | POA: Diagnosis not present

## 2022-01-04 DIAGNOSIS — N186 End stage renal disease: Secondary | ICD-10-CM | POA: Diagnosis not present

## 2022-01-04 DIAGNOSIS — Z992 Dependence on renal dialysis: Secondary | ICD-10-CM | POA: Diagnosis not present

## 2022-01-05 DIAGNOSIS — N186 End stage renal disease: Secondary | ICD-10-CM | POA: Diagnosis not present

## 2022-01-05 DIAGNOSIS — Z992 Dependence on renal dialysis: Secondary | ICD-10-CM | POA: Diagnosis not present

## 2022-01-06 DIAGNOSIS — Z992 Dependence on renal dialysis: Secondary | ICD-10-CM | POA: Diagnosis not present

## 2022-01-06 DIAGNOSIS — N186 End stage renal disease: Secondary | ICD-10-CM | POA: Diagnosis not present

## 2022-01-07 DIAGNOSIS — Z992 Dependence on renal dialysis: Secondary | ICD-10-CM | POA: Diagnosis not present

## 2022-01-07 DIAGNOSIS — N186 End stage renal disease: Secondary | ICD-10-CM | POA: Diagnosis not present

## 2022-01-08 DIAGNOSIS — Z992 Dependence on renal dialysis: Secondary | ICD-10-CM | POA: Diagnosis not present

## 2022-01-08 DIAGNOSIS — N186 End stage renal disease: Secondary | ICD-10-CM | POA: Diagnosis not present

## 2022-01-09 DIAGNOSIS — Z992 Dependence on renal dialysis: Secondary | ICD-10-CM | POA: Diagnosis not present

## 2022-01-09 DIAGNOSIS — N186 End stage renal disease: Secondary | ICD-10-CM | POA: Diagnosis not present

## 2022-01-10 DIAGNOSIS — Z992 Dependence on renal dialysis: Secondary | ICD-10-CM | POA: Diagnosis not present

## 2022-01-10 DIAGNOSIS — N186 End stage renal disease: Secondary | ICD-10-CM | POA: Diagnosis not present

## 2022-01-11 DIAGNOSIS — Z992 Dependence on renal dialysis: Secondary | ICD-10-CM | POA: Diagnosis not present

## 2022-01-11 DIAGNOSIS — N186 End stage renal disease: Secondary | ICD-10-CM | POA: Diagnosis not present

## 2022-01-11 IMAGING — MG MM DIGITAL SCREENING BILAT W/ TOMO AND CAD
8 series · 9 of 24 positions shown · non-contrast
Comparison: Previous exam(s).

CLINICAL DATA: Screening.

EXAM:
DIGITAL SCREENING BILATERAL MAMMOGRAM WITH TOMOSYNTHESIS AND CAD
TECHNIQUE: Bilateral screening digital craniocaudal and mediolateral oblique
mammograms were obtained. Bilateral screening digital breast
tomosynthesis was performed. The images were evaluated with
computer-aided detection.

[R CC synth-2D]
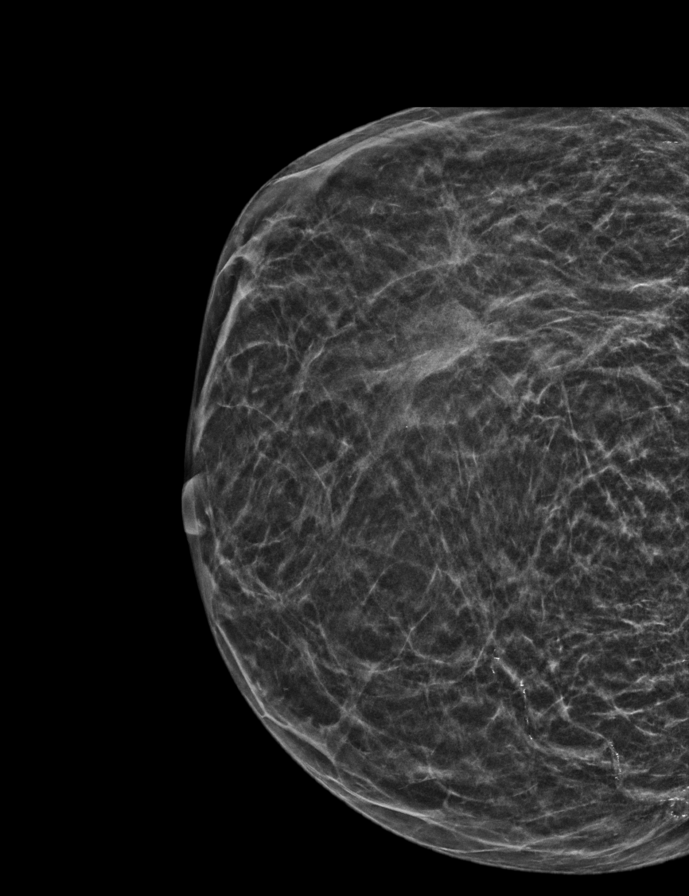

[L MLO synth-2D]
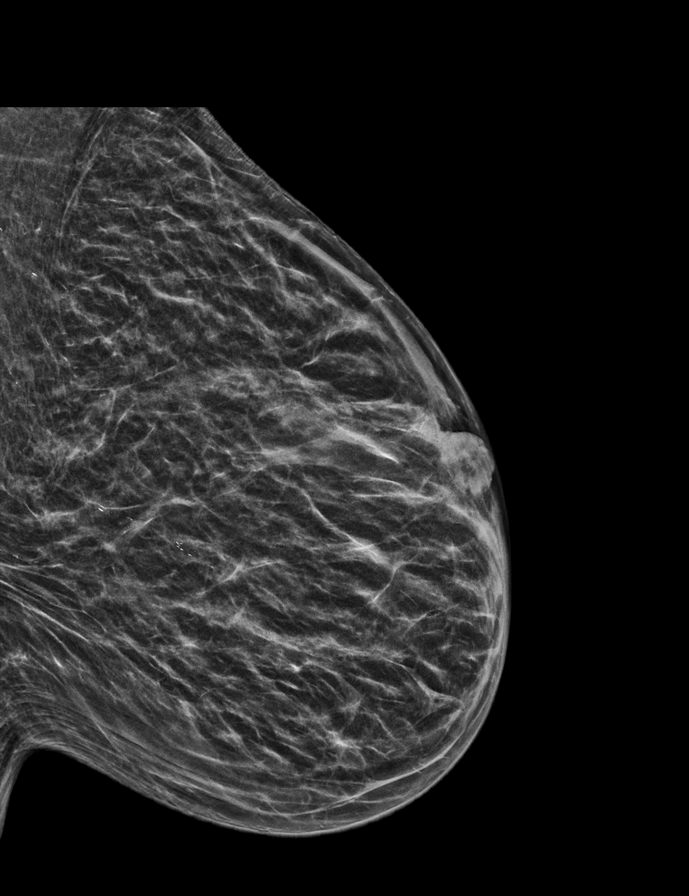

[L CC synth-2D]
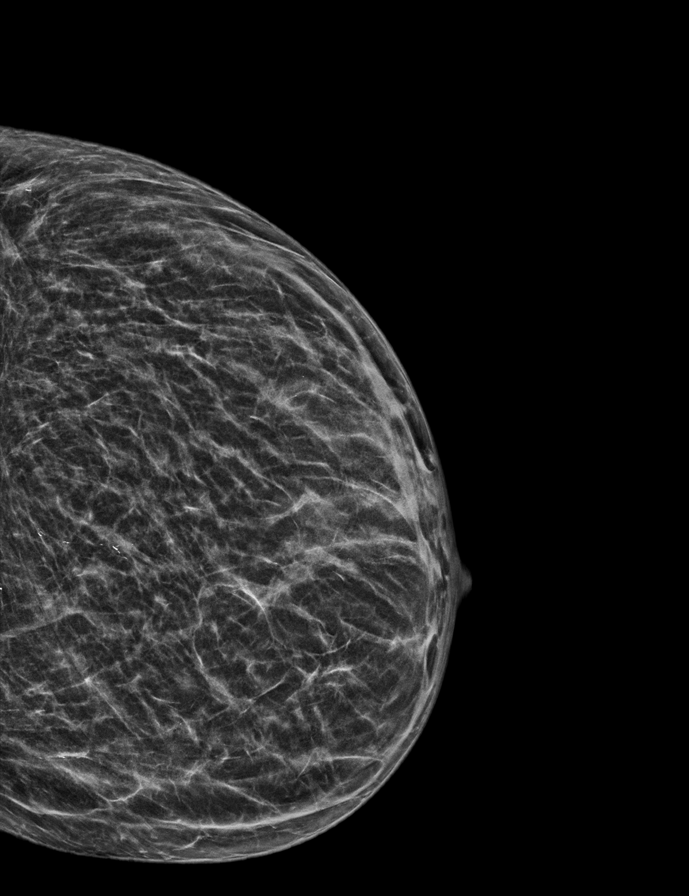

[R MLO synth-2D]
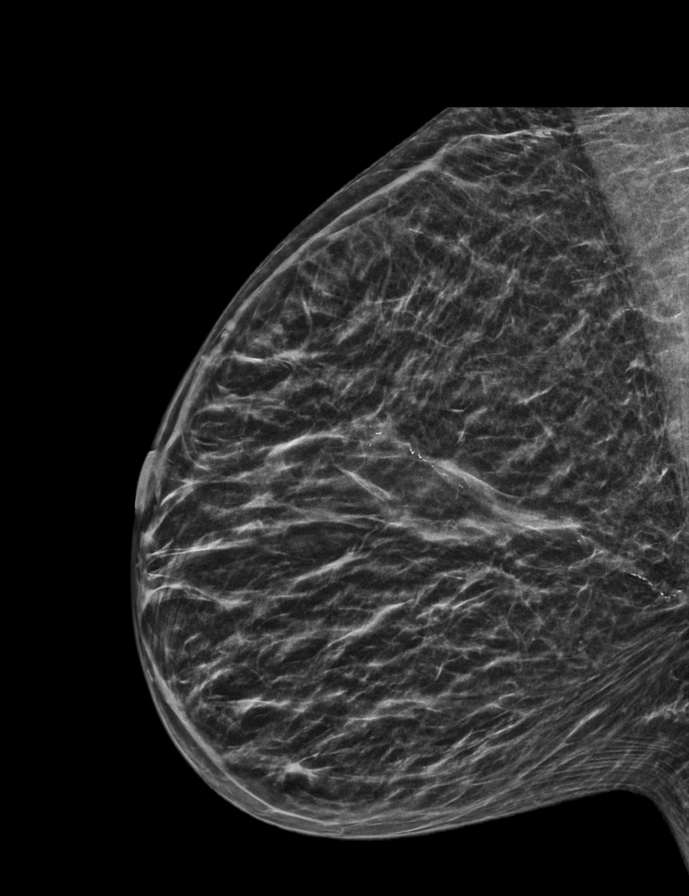

[L MLO tomo · 2 of 41 frames shown]
[frame 14/41]
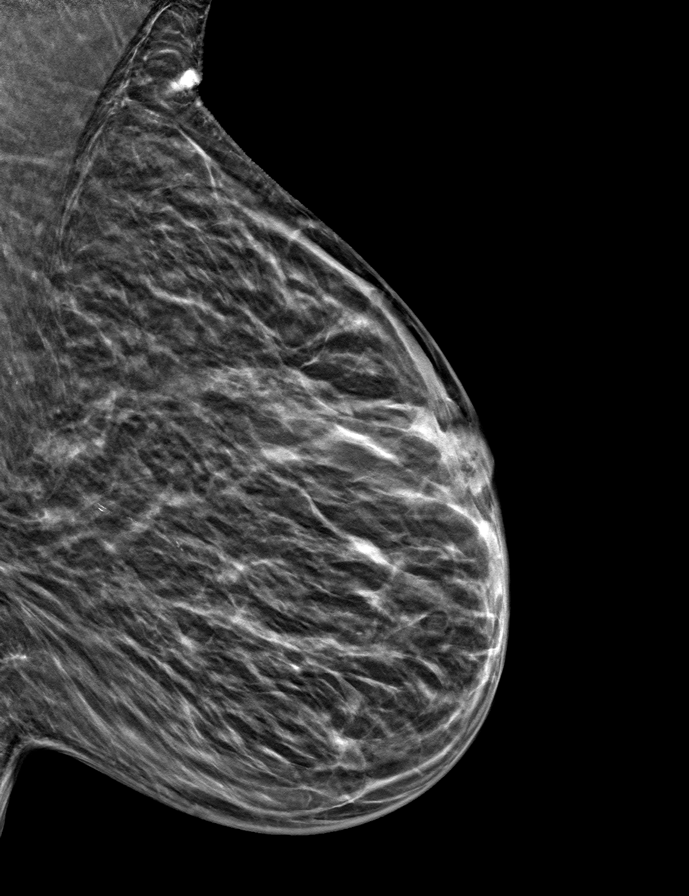
[frame 21/41]
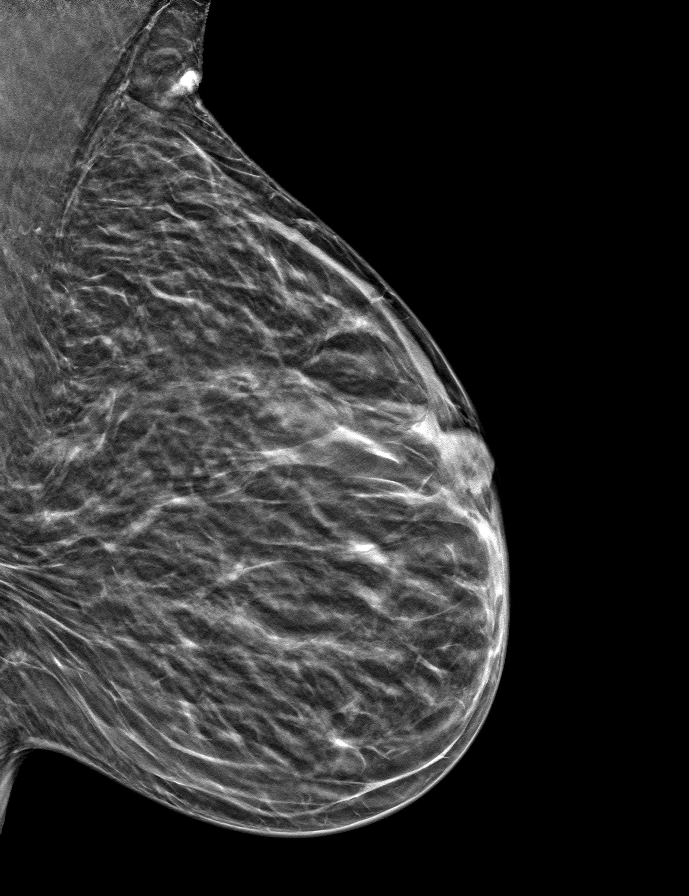

[L CC tomo · tomo slice 21/41.0]
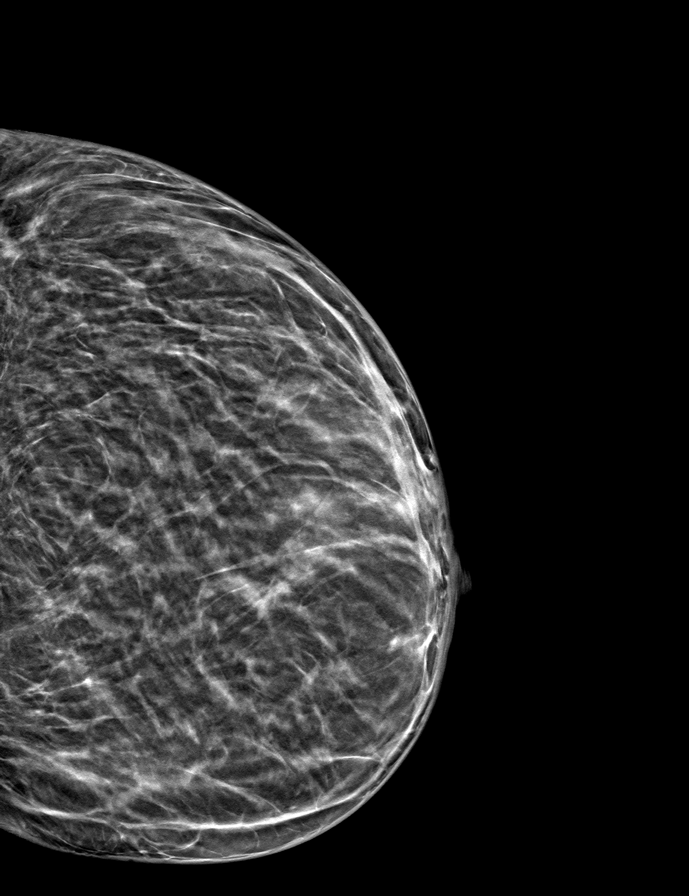

[R MLO tomo · tomo slice 21/41.0]
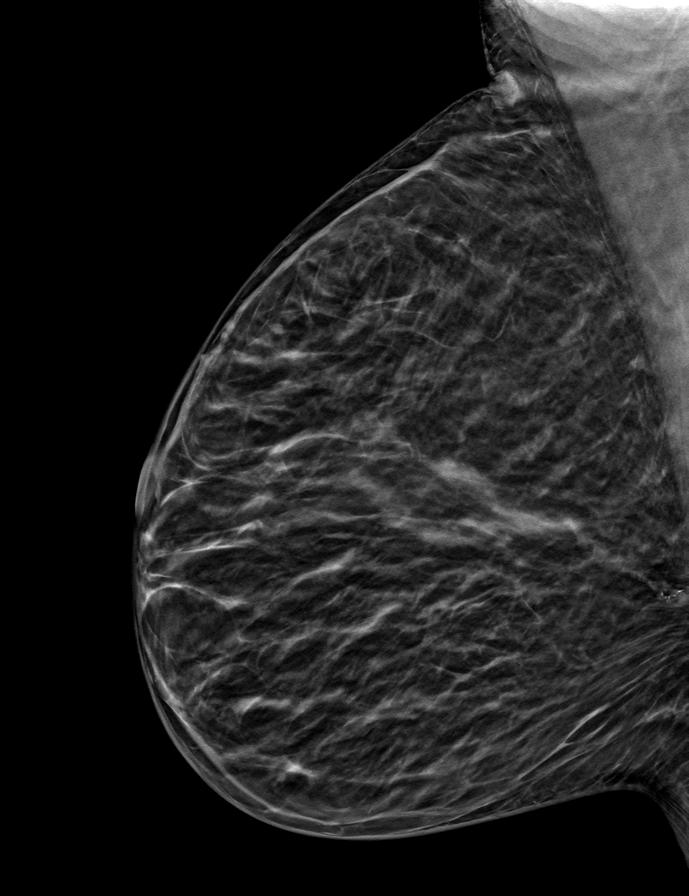

[R CC tomo · tomo slice 19/37.0]
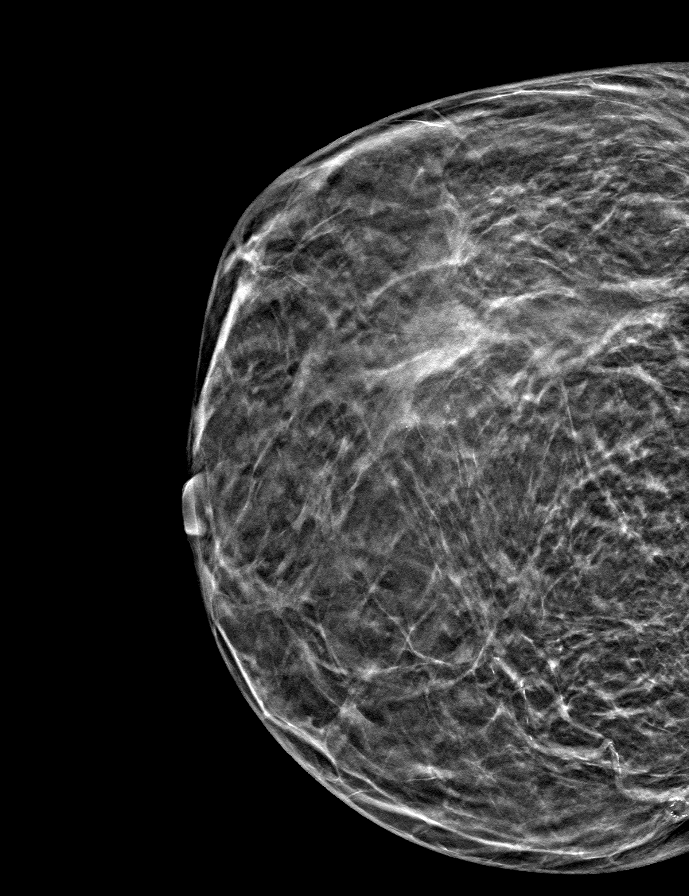

[9 of 24 positions shown; findings below may reference images not displayed]

ACR Breast Density Category b: There are scattered areas of
fibroglandular density.
FINDINGS: There are no findings suspicious for malignancy.
IMPRESSION: No mammographic evidence of malignancy. A result letter of this
screening mammogram will be mailed directly to the patient.

RECOMMENDATION:
Screening mammogram in one year. (Code:51-O-LD2)

BI-RADS CATEGORY  1: Negative.

## 2022-01-12 DIAGNOSIS — Z992 Dependence on renal dialysis: Secondary | ICD-10-CM | POA: Diagnosis not present

## 2022-01-12 DIAGNOSIS — N186 End stage renal disease: Secondary | ICD-10-CM | POA: Diagnosis not present

## 2022-01-13 DIAGNOSIS — N186 End stage renal disease: Secondary | ICD-10-CM | POA: Diagnosis not present

## 2022-01-13 DIAGNOSIS — Z992 Dependence on renal dialysis: Secondary | ICD-10-CM | POA: Diagnosis not present

## 2022-01-14 DIAGNOSIS — Z992 Dependence on renal dialysis: Secondary | ICD-10-CM | POA: Diagnosis not present

## 2022-01-14 DIAGNOSIS — N186 End stage renal disease: Secondary | ICD-10-CM | POA: Diagnosis not present

## 2022-01-15 DIAGNOSIS — I151 Hypertension secondary to other renal disorders: Secondary | ICD-10-CM | POA: Diagnosis not present

## 2022-01-15 DIAGNOSIS — N186 End stage renal disease: Secondary | ICD-10-CM | POA: Diagnosis not present

## 2022-01-15 DIAGNOSIS — R636 Underweight: Secondary | ICD-10-CM | POA: Diagnosis not present

## 2022-01-15 DIAGNOSIS — M329 Systemic lupus erythematosus, unspecified: Secondary | ICD-10-CM | POA: Diagnosis not present

## 2022-01-15 DIAGNOSIS — Z992 Dependence on renal dialysis: Secondary | ICD-10-CM | POA: Diagnosis not present

## 2022-01-15 DIAGNOSIS — M3214 Glomerular disease in systemic lupus erythematosus: Secondary | ICD-10-CM | POA: Diagnosis not present

## 2022-01-16 DIAGNOSIS — N186 End stage renal disease: Secondary | ICD-10-CM | POA: Diagnosis not present

## 2022-01-16 DIAGNOSIS — Z992 Dependence on renal dialysis: Secondary | ICD-10-CM | POA: Diagnosis not present

## 2022-01-17 DIAGNOSIS — Z992 Dependence on renal dialysis: Secondary | ICD-10-CM | POA: Diagnosis not present

## 2022-01-17 DIAGNOSIS — N186 End stage renal disease: Secondary | ICD-10-CM | POA: Diagnosis not present

## 2022-01-18 DIAGNOSIS — Z992 Dependence on renal dialysis: Secondary | ICD-10-CM | POA: Diagnosis not present

## 2022-01-18 DIAGNOSIS — N186 End stage renal disease: Secondary | ICD-10-CM | POA: Diagnosis not present

## 2022-01-19 DIAGNOSIS — N186 End stage renal disease: Secondary | ICD-10-CM | POA: Diagnosis not present

## 2022-01-19 DIAGNOSIS — Z992 Dependence on renal dialysis: Secondary | ICD-10-CM | POA: Diagnosis not present

## 2022-01-20 DIAGNOSIS — N186 End stage renal disease: Secondary | ICD-10-CM | POA: Diagnosis not present

## 2022-01-20 DIAGNOSIS — Z992 Dependence on renal dialysis: Secondary | ICD-10-CM | POA: Diagnosis not present

## 2022-01-21 DIAGNOSIS — N186 End stage renal disease: Secondary | ICD-10-CM | POA: Diagnosis not present

## 2022-01-21 DIAGNOSIS — Z992 Dependence on renal dialysis: Secondary | ICD-10-CM | POA: Diagnosis not present

## 2022-01-22 DIAGNOSIS — N186 End stage renal disease: Secondary | ICD-10-CM | POA: Diagnosis not present

## 2022-01-22 DIAGNOSIS — Z992 Dependence on renal dialysis: Secondary | ICD-10-CM | POA: Diagnosis not present

## 2022-01-23 DIAGNOSIS — N186 End stage renal disease: Secondary | ICD-10-CM | POA: Diagnosis not present

## 2022-01-23 DIAGNOSIS — Z992 Dependence on renal dialysis: Secondary | ICD-10-CM | POA: Diagnosis not present

## 2022-01-24 DIAGNOSIS — Z992 Dependence on renal dialysis: Secondary | ICD-10-CM | POA: Diagnosis not present

## 2022-01-24 DIAGNOSIS — N186 End stage renal disease: Secondary | ICD-10-CM | POA: Diagnosis not present

## 2022-01-25 DIAGNOSIS — N186 End stage renal disease: Secondary | ICD-10-CM | POA: Diagnosis not present

## 2022-01-25 DIAGNOSIS — Z992 Dependence on renal dialysis: Secondary | ICD-10-CM | POA: Diagnosis not present

## 2022-01-26 DIAGNOSIS — Z992 Dependence on renal dialysis: Secondary | ICD-10-CM | POA: Diagnosis not present

## 2022-01-26 DIAGNOSIS — N186 End stage renal disease: Secondary | ICD-10-CM | POA: Diagnosis not present

## 2022-01-27 DIAGNOSIS — Z992 Dependence on renal dialysis: Secondary | ICD-10-CM | POA: Diagnosis not present

## 2022-01-27 DIAGNOSIS — N186 End stage renal disease: Secondary | ICD-10-CM | POA: Diagnosis not present

## 2022-01-28 DIAGNOSIS — Z992 Dependence on renal dialysis: Secondary | ICD-10-CM | POA: Diagnosis not present

## 2022-01-28 DIAGNOSIS — N186 End stage renal disease: Secondary | ICD-10-CM | POA: Diagnosis not present

## 2022-01-29 DIAGNOSIS — N186 End stage renal disease: Secondary | ICD-10-CM | POA: Diagnosis not present

## 2022-01-29 DIAGNOSIS — Z992 Dependence on renal dialysis: Secondary | ICD-10-CM | POA: Diagnosis not present

## 2022-01-30 DIAGNOSIS — Z992 Dependence on renal dialysis: Secondary | ICD-10-CM | POA: Diagnosis not present

## 2022-01-30 DIAGNOSIS — N186 End stage renal disease: Secondary | ICD-10-CM | POA: Diagnosis not present

## 2022-01-31 DIAGNOSIS — Z992 Dependence on renal dialysis: Secondary | ICD-10-CM | POA: Diagnosis not present

## 2022-01-31 DIAGNOSIS — N186 End stage renal disease: Secondary | ICD-10-CM | POA: Diagnosis not present

## 2022-02-01 DIAGNOSIS — Z992 Dependence on renal dialysis: Secondary | ICD-10-CM | POA: Diagnosis not present

## 2022-02-01 DIAGNOSIS — N186 End stage renal disease: Secondary | ICD-10-CM | POA: Diagnosis not present

## 2022-02-02 DIAGNOSIS — Z992 Dependence on renal dialysis: Secondary | ICD-10-CM | POA: Diagnosis not present

## 2022-02-02 DIAGNOSIS — N186 End stage renal disease: Secondary | ICD-10-CM | POA: Diagnosis not present

## 2022-02-03 DIAGNOSIS — Z992 Dependence on renal dialysis: Secondary | ICD-10-CM | POA: Diagnosis not present

## 2022-02-03 DIAGNOSIS — N186 End stage renal disease: Secondary | ICD-10-CM | POA: Diagnosis not present

## 2022-02-04 DIAGNOSIS — Z992 Dependence on renal dialysis: Secondary | ICD-10-CM | POA: Diagnosis not present

## 2022-02-04 DIAGNOSIS — N186 End stage renal disease: Secondary | ICD-10-CM | POA: Diagnosis not present

## 2022-02-05 DIAGNOSIS — N186 End stage renal disease: Secondary | ICD-10-CM | POA: Diagnosis not present

## 2022-02-05 DIAGNOSIS — Z992 Dependence on renal dialysis: Secondary | ICD-10-CM | POA: Diagnosis not present

## 2022-02-06 DIAGNOSIS — N186 End stage renal disease: Secondary | ICD-10-CM | POA: Diagnosis not present

## 2022-02-06 DIAGNOSIS — Z992 Dependence on renal dialysis: Secondary | ICD-10-CM | POA: Diagnosis not present

## 2022-02-07 DIAGNOSIS — Z992 Dependence on renal dialysis: Secondary | ICD-10-CM | POA: Diagnosis not present

## 2022-02-07 DIAGNOSIS — N186 End stage renal disease: Secondary | ICD-10-CM | POA: Diagnosis not present

## 2022-02-08 DIAGNOSIS — N186 End stage renal disease: Secondary | ICD-10-CM | POA: Diagnosis not present

## 2022-02-08 DIAGNOSIS — Z992 Dependence on renal dialysis: Secondary | ICD-10-CM | POA: Diagnosis not present

## 2022-02-09 DIAGNOSIS — Z992 Dependence on renal dialysis: Secondary | ICD-10-CM | POA: Diagnosis not present

## 2022-02-09 DIAGNOSIS — N186 End stage renal disease: Secondary | ICD-10-CM | POA: Diagnosis not present

## 2022-02-10 DIAGNOSIS — N186 End stage renal disease: Secondary | ICD-10-CM | POA: Diagnosis not present

## 2022-02-10 DIAGNOSIS — Z992 Dependence on renal dialysis: Secondary | ICD-10-CM | POA: Diagnosis not present

## 2022-02-11 DIAGNOSIS — N186 End stage renal disease: Secondary | ICD-10-CM | POA: Diagnosis not present

## 2022-02-11 DIAGNOSIS — Z992 Dependence on renal dialysis: Secondary | ICD-10-CM | POA: Diagnosis not present

## 2022-02-12 DIAGNOSIS — Z992 Dependence on renal dialysis: Secondary | ICD-10-CM | POA: Diagnosis not present

## 2022-02-12 DIAGNOSIS — N186 End stage renal disease: Secondary | ICD-10-CM | POA: Diagnosis not present

## 2022-02-13 DIAGNOSIS — N186 End stage renal disease: Secondary | ICD-10-CM | POA: Diagnosis not present

## 2022-02-13 DIAGNOSIS — Z992 Dependence on renal dialysis: Secondary | ICD-10-CM | POA: Diagnosis not present

## 2022-02-14 DIAGNOSIS — N186 End stage renal disease: Secondary | ICD-10-CM | POA: Diagnosis not present

## 2022-02-14 DIAGNOSIS — Z992 Dependence on renal dialysis: Secondary | ICD-10-CM | POA: Diagnosis not present

## 2022-02-15 DIAGNOSIS — N186 End stage renal disease: Secondary | ICD-10-CM | POA: Diagnosis not present

## 2022-02-15 DIAGNOSIS — Z992 Dependence on renal dialysis: Secondary | ICD-10-CM | POA: Diagnosis not present

## 2022-02-16 DIAGNOSIS — Z992 Dependence on renal dialysis: Secondary | ICD-10-CM | POA: Diagnosis not present

## 2022-02-16 DIAGNOSIS — N186 End stage renal disease: Secondary | ICD-10-CM | POA: Diagnosis not present

## 2022-02-17 DIAGNOSIS — Z992 Dependence on renal dialysis: Secondary | ICD-10-CM | POA: Diagnosis not present

## 2022-02-17 DIAGNOSIS — N186 End stage renal disease: Secondary | ICD-10-CM | POA: Diagnosis not present

## 2022-02-18 DIAGNOSIS — N186 End stage renal disease: Secondary | ICD-10-CM | POA: Diagnosis not present

## 2022-02-18 DIAGNOSIS — Z992 Dependence on renal dialysis: Secondary | ICD-10-CM | POA: Diagnosis not present

## 2022-02-19 DIAGNOSIS — Z992 Dependence on renal dialysis: Secondary | ICD-10-CM | POA: Diagnosis not present

## 2022-02-19 DIAGNOSIS — N186 End stage renal disease: Secondary | ICD-10-CM | POA: Diagnosis not present

## 2022-02-20 DIAGNOSIS — N186 End stage renal disease: Secondary | ICD-10-CM | POA: Diagnosis not present

## 2022-02-20 DIAGNOSIS — Z992 Dependence on renal dialysis: Secondary | ICD-10-CM | POA: Diagnosis not present

## 2022-02-21 DIAGNOSIS — N186 End stage renal disease: Secondary | ICD-10-CM | POA: Diagnosis not present

## 2022-02-21 DIAGNOSIS — Z992 Dependence on renal dialysis: Secondary | ICD-10-CM | POA: Diagnosis not present

## 2022-02-22 DIAGNOSIS — N186 End stage renal disease: Secondary | ICD-10-CM | POA: Diagnosis not present

## 2022-02-22 DIAGNOSIS — Z992 Dependence on renal dialysis: Secondary | ICD-10-CM | POA: Diagnosis not present

## 2022-02-23 DIAGNOSIS — Z992 Dependence on renal dialysis: Secondary | ICD-10-CM | POA: Diagnosis not present

## 2022-02-23 DIAGNOSIS — N186 End stage renal disease: Secondary | ICD-10-CM | POA: Diagnosis not present

## 2022-02-24 DIAGNOSIS — Z992 Dependence on renal dialysis: Secondary | ICD-10-CM | POA: Diagnosis not present

## 2022-02-24 DIAGNOSIS — N186 End stage renal disease: Secondary | ICD-10-CM | POA: Diagnosis not present

## 2022-02-25 DIAGNOSIS — N186 End stage renal disease: Secondary | ICD-10-CM | POA: Diagnosis not present

## 2022-02-25 DIAGNOSIS — Z992 Dependence on renal dialysis: Secondary | ICD-10-CM | POA: Diagnosis not present

## 2022-02-26 DIAGNOSIS — N186 End stage renal disease: Secondary | ICD-10-CM | POA: Diagnosis not present

## 2022-02-26 DIAGNOSIS — Z992 Dependence on renal dialysis: Secondary | ICD-10-CM | POA: Diagnosis not present

## 2022-02-27 DIAGNOSIS — N186 End stage renal disease: Secondary | ICD-10-CM | POA: Diagnosis not present

## 2022-02-27 DIAGNOSIS — Z992 Dependence on renal dialysis: Secondary | ICD-10-CM | POA: Diagnosis not present

## 2022-02-28 DIAGNOSIS — N186 End stage renal disease: Secondary | ICD-10-CM | POA: Diagnosis not present

## 2022-02-28 DIAGNOSIS — Z992 Dependence on renal dialysis: Secondary | ICD-10-CM | POA: Diagnosis not present

## 2022-03-01 DIAGNOSIS — N186 End stage renal disease: Secondary | ICD-10-CM | POA: Diagnosis not present

## 2022-03-01 DIAGNOSIS — Z992 Dependence on renal dialysis: Secondary | ICD-10-CM | POA: Diagnosis not present

## 2022-03-02 DIAGNOSIS — N186 End stage renal disease: Secondary | ICD-10-CM | POA: Diagnosis not present

## 2022-03-02 DIAGNOSIS — Z992 Dependence on renal dialysis: Secondary | ICD-10-CM | POA: Diagnosis not present

## 2022-03-03 DIAGNOSIS — Z992 Dependence on renal dialysis: Secondary | ICD-10-CM | POA: Diagnosis not present

## 2022-03-03 DIAGNOSIS — N186 End stage renal disease: Secondary | ICD-10-CM | POA: Diagnosis not present

## 2022-03-04 DIAGNOSIS — N186 End stage renal disease: Secondary | ICD-10-CM | POA: Diagnosis not present

## 2022-03-04 DIAGNOSIS — Z992 Dependence on renal dialysis: Secondary | ICD-10-CM | POA: Diagnosis not present

## 2022-03-05 DIAGNOSIS — Z992 Dependence on renal dialysis: Secondary | ICD-10-CM | POA: Diagnosis not present

## 2022-03-05 DIAGNOSIS — N186 End stage renal disease: Secondary | ICD-10-CM | POA: Diagnosis not present

## 2022-03-06 DIAGNOSIS — N186 End stage renal disease: Secondary | ICD-10-CM | POA: Diagnosis not present

## 2022-03-06 DIAGNOSIS — Z992 Dependence on renal dialysis: Secondary | ICD-10-CM | POA: Diagnosis not present

## 2022-03-07 DIAGNOSIS — N186 End stage renal disease: Secondary | ICD-10-CM | POA: Diagnosis not present

## 2022-03-07 DIAGNOSIS — Z992 Dependence on renal dialysis: Secondary | ICD-10-CM | POA: Diagnosis not present

## 2022-03-08 DIAGNOSIS — Z992 Dependence on renal dialysis: Secondary | ICD-10-CM | POA: Diagnosis not present

## 2022-03-08 DIAGNOSIS — N186 End stage renal disease: Secondary | ICD-10-CM | POA: Diagnosis not present

## 2022-03-09 DIAGNOSIS — Z992 Dependence on renal dialysis: Secondary | ICD-10-CM | POA: Diagnosis not present

## 2022-03-09 DIAGNOSIS — N186 End stage renal disease: Secondary | ICD-10-CM | POA: Diagnosis not present

## 2022-03-10 DIAGNOSIS — Z992 Dependence on renal dialysis: Secondary | ICD-10-CM | POA: Diagnosis not present

## 2022-03-10 DIAGNOSIS — N186 End stage renal disease: Secondary | ICD-10-CM | POA: Diagnosis not present

## 2022-03-11 DIAGNOSIS — Z992 Dependence on renal dialysis: Secondary | ICD-10-CM | POA: Diagnosis not present

## 2022-03-11 DIAGNOSIS — N186 End stage renal disease: Secondary | ICD-10-CM | POA: Diagnosis not present

## 2022-03-12 DIAGNOSIS — Z992 Dependence on renal dialysis: Secondary | ICD-10-CM | POA: Diagnosis not present

## 2022-03-12 DIAGNOSIS — N186 End stage renal disease: Secondary | ICD-10-CM | POA: Diagnosis not present

## 2022-03-13 DIAGNOSIS — Z992 Dependence on renal dialysis: Secondary | ICD-10-CM | POA: Diagnosis not present

## 2022-03-13 DIAGNOSIS — N186 End stage renal disease: Secondary | ICD-10-CM | POA: Diagnosis not present

## 2022-03-14 DIAGNOSIS — N186 End stage renal disease: Secondary | ICD-10-CM | POA: Diagnosis not present

## 2022-03-14 DIAGNOSIS — Z992 Dependence on renal dialysis: Secondary | ICD-10-CM | POA: Diagnosis not present

## 2022-03-15 DIAGNOSIS — Z992 Dependence on renal dialysis: Secondary | ICD-10-CM | POA: Diagnosis not present

## 2022-03-15 DIAGNOSIS — N186 End stage renal disease: Secondary | ICD-10-CM | POA: Diagnosis not present

## 2022-03-16 DIAGNOSIS — N186 End stage renal disease: Secondary | ICD-10-CM | POA: Diagnosis not present

## 2022-03-16 DIAGNOSIS — Z992 Dependence on renal dialysis: Secondary | ICD-10-CM | POA: Diagnosis not present

## 2022-03-17 DIAGNOSIS — Z992 Dependence on renal dialysis: Secondary | ICD-10-CM | POA: Diagnosis not present

## 2022-03-17 DIAGNOSIS — N186 End stage renal disease: Secondary | ICD-10-CM | POA: Diagnosis not present

## 2022-03-18 DIAGNOSIS — Z992 Dependence on renal dialysis: Secondary | ICD-10-CM | POA: Diagnosis not present

## 2022-03-18 DIAGNOSIS — N186 End stage renal disease: Secondary | ICD-10-CM | POA: Diagnosis not present

## 2022-03-19 DIAGNOSIS — Z992 Dependence on renal dialysis: Secondary | ICD-10-CM | POA: Diagnosis not present

## 2022-03-19 DIAGNOSIS — N186 End stage renal disease: Secondary | ICD-10-CM | POA: Diagnosis not present

## 2022-03-20 DIAGNOSIS — Z992 Dependence on renal dialysis: Secondary | ICD-10-CM | POA: Diagnosis not present

## 2022-03-20 DIAGNOSIS — N186 End stage renal disease: Secondary | ICD-10-CM | POA: Diagnosis not present

## 2022-03-21 DIAGNOSIS — Z992 Dependence on renal dialysis: Secondary | ICD-10-CM | POA: Diagnosis not present

## 2022-03-21 DIAGNOSIS — N186 End stage renal disease: Secondary | ICD-10-CM | POA: Diagnosis not present

## 2022-03-22 DIAGNOSIS — N186 End stage renal disease: Secondary | ICD-10-CM | POA: Diagnosis not present

## 2022-03-22 DIAGNOSIS — Z992 Dependence on renal dialysis: Secondary | ICD-10-CM | POA: Diagnosis not present

## 2022-03-23 DIAGNOSIS — Z992 Dependence on renal dialysis: Secondary | ICD-10-CM | POA: Diagnosis not present

## 2022-03-23 DIAGNOSIS — N186 End stage renal disease: Secondary | ICD-10-CM | POA: Diagnosis not present

## 2022-03-24 DIAGNOSIS — Z992 Dependence on renal dialysis: Secondary | ICD-10-CM | POA: Diagnosis not present

## 2022-03-24 DIAGNOSIS — N186 End stage renal disease: Secondary | ICD-10-CM | POA: Diagnosis not present

## 2022-03-25 DIAGNOSIS — Z992 Dependence on renal dialysis: Secondary | ICD-10-CM | POA: Diagnosis not present

## 2022-03-25 DIAGNOSIS — N186 End stage renal disease: Secondary | ICD-10-CM | POA: Diagnosis not present

## 2022-03-26 DIAGNOSIS — N186 End stage renal disease: Secondary | ICD-10-CM | POA: Diagnosis not present

## 2022-03-26 DIAGNOSIS — Z992 Dependence on renal dialysis: Secondary | ICD-10-CM | POA: Diagnosis not present

## 2022-03-27 DIAGNOSIS — N186 End stage renal disease: Secondary | ICD-10-CM | POA: Diagnosis not present

## 2022-03-27 DIAGNOSIS — Z992 Dependence on renal dialysis: Secondary | ICD-10-CM | POA: Diagnosis not present

## 2022-03-28 DIAGNOSIS — Z992 Dependence on renal dialysis: Secondary | ICD-10-CM | POA: Diagnosis not present

## 2022-03-28 DIAGNOSIS — N186 End stage renal disease: Secondary | ICD-10-CM | POA: Diagnosis not present

## 2022-03-29 DIAGNOSIS — Z992 Dependence on renal dialysis: Secondary | ICD-10-CM | POA: Diagnosis not present

## 2022-03-29 DIAGNOSIS — N186 End stage renal disease: Secondary | ICD-10-CM | POA: Diagnosis not present

## 2022-03-30 DIAGNOSIS — Z992 Dependence on renal dialysis: Secondary | ICD-10-CM | POA: Diagnosis not present

## 2022-03-30 DIAGNOSIS — N186 End stage renal disease: Secondary | ICD-10-CM | POA: Diagnosis not present

## 2022-03-31 DIAGNOSIS — Z992 Dependence on renal dialysis: Secondary | ICD-10-CM | POA: Diagnosis not present

## 2022-03-31 DIAGNOSIS — N186 End stage renal disease: Secondary | ICD-10-CM | POA: Diagnosis not present

## 2022-04-01 DIAGNOSIS — Z992 Dependence on renal dialysis: Secondary | ICD-10-CM | POA: Diagnosis not present

## 2022-04-01 DIAGNOSIS — N186 End stage renal disease: Secondary | ICD-10-CM | POA: Diagnosis not present

## 2022-04-02 DIAGNOSIS — N186 End stage renal disease: Secondary | ICD-10-CM | POA: Diagnosis not present

## 2022-04-02 DIAGNOSIS — Z992 Dependence on renal dialysis: Secondary | ICD-10-CM | POA: Diagnosis not present

## 2022-04-03 DIAGNOSIS — N186 End stage renal disease: Secondary | ICD-10-CM | POA: Diagnosis not present

## 2022-04-03 DIAGNOSIS — Z992 Dependence on renal dialysis: Secondary | ICD-10-CM | POA: Diagnosis not present

## 2022-04-04 DIAGNOSIS — Z992 Dependence on renal dialysis: Secondary | ICD-10-CM | POA: Diagnosis not present

## 2022-04-04 DIAGNOSIS — N186 End stage renal disease: Secondary | ICD-10-CM | POA: Diagnosis not present

## 2022-04-05 DIAGNOSIS — Z992 Dependence on renal dialysis: Secondary | ICD-10-CM | POA: Diagnosis not present

## 2022-04-05 DIAGNOSIS — N186 End stage renal disease: Secondary | ICD-10-CM | POA: Diagnosis not present

## 2022-04-06 DIAGNOSIS — Z992 Dependence on renal dialysis: Secondary | ICD-10-CM | POA: Diagnosis not present

## 2022-04-06 DIAGNOSIS — N186 End stage renal disease: Secondary | ICD-10-CM | POA: Diagnosis not present

## 2022-04-07 DIAGNOSIS — N186 End stage renal disease: Secondary | ICD-10-CM | POA: Diagnosis not present

## 2022-04-07 DIAGNOSIS — Z992 Dependence on renal dialysis: Secondary | ICD-10-CM | POA: Diagnosis not present

## 2022-04-08 DIAGNOSIS — N186 End stage renal disease: Secondary | ICD-10-CM | POA: Diagnosis not present

## 2022-04-08 DIAGNOSIS — Z992 Dependence on renal dialysis: Secondary | ICD-10-CM | POA: Diagnosis not present

## 2022-04-09 DIAGNOSIS — N186 End stage renal disease: Secondary | ICD-10-CM | POA: Diagnosis not present

## 2022-04-09 DIAGNOSIS — Z992 Dependence on renal dialysis: Secondary | ICD-10-CM | POA: Diagnosis not present

## 2022-04-10 DIAGNOSIS — N186 End stage renal disease: Secondary | ICD-10-CM | POA: Diagnosis not present

## 2022-04-10 DIAGNOSIS — Z992 Dependence on renal dialysis: Secondary | ICD-10-CM | POA: Diagnosis not present

## 2022-04-11 DIAGNOSIS — Z992 Dependence on renal dialysis: Secondary | ICD-10-CM | POA: Diagnosis not present

## 2022-04-11 DIAGNOSIS — N186 End stage renal disease: Secondary | ICD-10-CM | POA: Diagnosis not present

## 2022-04-12 DIAGNOSIS — N186 End stage renal disease: Secondary | ICD-10-CM | POA: Diagnosis not present

## 2022-04-12 DIAGNOSIS — Z992 Dependence on renal dialysis: Secondary | ICD-10-CM | POA: Diagnosis not present

## 2022-04-13 DIAGNOSIS — N186 End stage renal disease: Secondary | ICD-10-CM | POA: Diagnosis not present

## 2022-04-13 DIAGNOSIS — Z992 Dependence on renal dialysis: Secondary | ICD-10-CM | POA: Diagnosis not present

## 2022-04-14 DIAGNOSIS — Z992 Dependence on renal dialysis: Secondary | ICD-10-CM | POA: Diagnosis not present

## 2022-04-14 DIAGNOSIS — N186 End stage renal disease: Secondary | ICD-10-CM | POA: Diagnosis not present

## 2022-04-15 DIAGNOSIS — Z992 Dependence on renal dialysis: Secondary | ICD-10-CM | POA: Diagnosis not present

## 2022-04-15 DIAGNOSIS — N186 End stage renal disease: Secondary | ICD-10-CM | POA: Diagnosis not present

## 2022-04-16 DIAGNOSIS — N186 End stage renal disease: Secondary | ICD-10-CM | POA: Diagnosis not present

## 2022-04-16 DIAGNOSIS — Z992 Dependence on renal dialysis: Secondary | ICD-10-CM | POA: Diagnosis not present

## 2022-04-17 DIAGNOSIS — N186 End stage renal disease: Secondary | ICD-10-CM | POA: Diagnosis not present

## 2022-04-17 DIAGNOSIS — Z992 Dependence on renal dialysis: Secondary | ICD-10-CM | POA: Diagnosis not present

## 2022-04-18 DIAGNOSIS — N186 End stage renal disease: Secondary | ICD-10-CM | POA: Diagnosis not present

## 2022-04-18 DIAGNOSIS — Z992 Dependence on renal dialysis: Secondary | ICD-10-CM | POA: Diagnosis not present

## 2022-04-19 DIAGNOSIS — N186 End stage renal disease: Secondary | ICD-10-CM | POA: Diagnosis not present

## 2022-04-19 DIAGNOSIS — Z992 Dependence on renal dialysis: Secondary | ICD-10-CM | POA: Diagnosis not present

## 2022-04-20 DIAGNOSIS — N186 End stage renal disease: Secondary | ICD-10-CM | POA: Diagnosis not present

## 2022-04-20 DIAGNOSIS — Z992 Dependence on renal dialysis: Secondary | ICD-10-CM | POA: Diagnosis not present

## 2022-04-21 DIAGNOSIS — N186 End stage renal disease: Secondary | ICD-10-CM | POA: Diagnosis not present

## 2022-04-21 DIAGNOSIS — Z992 Dependence on renal dialysis: Secondary | ICD-10-CM | POA: Diagnosis not present

## 2022-04-22 DIAGNOSIS — Z992 Dependence on renal dialysis: Secondary | ICD-10-CM | POA: Diagnosis not present

## 2022-04-22 DIAGNOSIS — N186 End stage renal disease: Secondary | ICD-10-CM | POA: Diagnosis not present

## 2022-04-23 ENCOUNTER — Other Ambulatory Visit: Payer: Self-pay | Admitting: Nephrology

## 2022-04-23 DIAGNOSIS — R600 Localized edema: Secondary | ICD-10-CM

## 2022-04-23 DIAGNOSIS — N186 End stage renal disease: Secondary | ICD-10-CM

## 2022-04-23 DIAGNOSIS — Z992 Dependence on renal dialysis: Secondary | ICD-10-CM | POA: Diagnosis not present

## 2022-04-24 DIAGNOSIS — Z992 Dependence on renal dialysis: Secondary | ICD-10-CM | POA: Diagnosis not present

## 2022-04-24 DIAGNOSIS — N186 End stage renal disease: Secondary | ICD-10-CM | POA: Diagnosis not present

## 2022-04-25 DIAGNOSIS — Z992 Dependence on renal dialysis: Secondary | ICD-10-CM | POA: Diagnosis not present

## 2022-04-25 DIAGNOSIS — N186 End stage renal disease: Secondary | ICD-10-CM | POA: Diagnosis not present

## 2022-04-26 DIAGNOSIS — N186 End stage renal disease: Secondary | ICD-10-CM | POA: Diagnosis not present

## 2022-04-26 DIAGNOSIS — Z992 Dependence on renal dialysis: Secondary | ICD-10-CM | POA: Diagnosis not present

## 2022-04-27 DIAGNOSIS — Z992 Dependence on renal dialysis: Secondary | ICD-10-CM | POA: Diagnosis not present

## 2022-04-27 DIAGNOSIS — N186 End stage renal disease: Secondary | ICD-10-CM | POA: Diagnosis not present

## 2022-04-28 DIAGNOSIS — N186 End stage renal disease: Secondary | ICD-10-CM | POA: Diagnosis not present

## 2022-04-28 DIAGNOSIS — Z992 Dependence on renal dialysis: Secondary | ICD-10-CM | POA: Diagnosis not present

## 2022-04-29 DIAGNOSIS — Z992 Dependence on renal dialysis: Secondary | ICD-10-CM | POA: Diagnosis not present

## 2022-04-29 DIAGNOSIS — N186 End stage renal disease: Secondary | ICD-10-CM | POA: Diagnosis not present

## 2022-04-30 DIAGNOSIS — N186 End stage renal disease: Secondary | ICD-10-CM | POA: Diagnosis not present

## 2022-04-30 DIAGNOSIS — Z992 Dependence on renal dialysis: Secondary | ICD-10-CM | POA: Diagnosis not present

## 2022-05-01 DIAGNOSIS — Z992 Dependence on renal dialysis: Secondary | ICD-10-CM | POA: Diagnosis not present

## 2022-05-01 DIAGNOSIS — N186 End stage renal disease: Secondary | ICD-10-CM | POA: Diagnosis not present

## 2022-05-02 DIAGNOSIS — Z992 Dependence on renal dialysis: Secondary | ICD-10-CM | POA: Diagnosis not present

## 2022-05-02 DIAGNOSIS — N186 End stage renal disease: Secondary | ICD-10-CM | POA: Diagnosis not present

## 2022-05-04 DIAGNOSIS — Z992 Dependence on renal dialysis: Secondary | ICD-10-CM | POA: Diagnosis not present

## 2022-05-04 DIAGNOSIS — N186 End stage renal disease: Secondary | ICD-10-CM | POA: Diagnosis not present

## 2022-05-05 DIAGNOSIS — N186 End stage renal disease: Secondary | ICD-10-CM | POA: Diagnosis not present

## 2022-05-05 DIAGNOSIS — Z992 Dependence on renal dialysis: Secondary | ICD-10-CM | POA: Diagnosis not present

## 2022-05-06 DIAGNOSIS — N186 End stage renal disease: Secondary | ICD-10-CM | POA: Diagnosis not present

## 2022-05-06 DIAGNOSIS — Z992 Dependence on renal dialysis: Secondary | ICD-10-CM | POA: Diagnosis not present

## 2022-05-07 DIAGNOSIS — N186 End stage renal disease: Secondary | ICD-10-CM | POA: Diagnosis not present

## 2022-05-07 DIAGNOSIS — Z992 Dependence on renal dialysis: Secondary | ICD-10-CM | POA: Diagnosis not present

## 2022-05-08 DIAGNOSIS — N186 End stage renal disease: Secondary | ICD-10-CM | POA: Diagnosis not present

## 2022-05-08 DIAGNOSIS — Z13220 Encounter for screening for lipoid disorders: Secondary | ICD-10-CM | POA: Diagnosis not present

## 2022-05-08 DIAGNOSIS — Z992 Dependence on renal dialysis: Secondary | ICD-10-CM | POA: Diagnosis not present

## 2022-05-09 DIAGNOSIS — N186 End stage renal disease: Secondary | ICD-10-CM | POA: Diagnosis not present

## 2022-05-09 DIAGNOSIS — Z992 Dependence on renal dialysis: Secondary | ICD-10-CM | POA: Diagnosis not present

## 2022-05-10 DIAGNOSIS — Z992 Dependence on renal dialysis: Secondary | ICD-10-CM | POA: Diagnosis not present

## 2022-05-10 DIAGNOSIS — N186 End stage renal disease: Secondary | ICD-10-CM | POA: Diagnosis not present

## 2022-05-11 DIAGNOSIS — N186 End stage renal disease: Secondary | ICD-10-CM | POA: Diagnosis not present

## 2022-05-11 DIAGNOSIS — Z992 Dependence on renal dialysis: Secondary | ICD-10-CM | POA: Diagnosis not present

## 2022-05-12 DIAGNOSIS — Z992 Dependence on renal dialysis: Secondary | ICD-10-CM | POA: Diagnosis not present

## 2022-05-12 DIAGNOSIS — N186 End stage renal disease: Secondary | ICD-10-CM | POA: Diagnosis not present

## 2022-05-13 DIAGNOSIS — N186 End stage renal disease: Secondary | ICD-10-CM | POA: Diagnosis not present

## 2022-05-13 DIAGNOSIS — Z992 Dependence on renal dialysis: Secondary | ICD-10-CM | POA: Diagnosis not present

## 2022-05-14 DIAGNOSIS — N186 End stage renal disease: Secondary | ICD-10-CM | POA: Diagnosis not present

## 2022-05-14 DIAGNOSIS — Z992 Dependence on renal dialysis: Secondary | ICD-10-CM | POA: Diagnosis not present

## 2022-05-15 DIAGNOSIS — N186 End stage renal disease: Secondary | ICD-10-CM | POA: Diagnosis not present

## 2022-05-15 DIAGNOSIS — Z992 Dependence on renal dialysis: Secondary | ICD-10-CM | POA: Diagnosis not present

## 2022-05-16 DIAGNOSIS — Z992 Dependence on renal dialysis: Secondary | ICD-10-CM | POA: Diagnosis not present

## 2022-05-16 DIAGNOSIS — N186 End stage renal disease: Secondary | ICD-10-CM | POA: Diagnosis not present

## 2022-05-17 DIAGNOSIS — Z992 Dependence on renal dialysis: Secondary | ICD-10-CM | POA: Diagnosis not present

## 2022-05-17 DIAGNOSIS — N186 End stage renal disease: Secondary | ICD-10-CM | POA: Diagnosis not present

## 2022-05-18 DIAGNOSIS — N186 End stage renal disease: Secondary | ICD-10-CM | POA: Diagnosis not present

## 2022-05-18 DIAGNOSIS — Z992 Dependence on renal dialysis: Secondary | ICD-10-CM | POA: Diagnosis not present

## 2022-05-19 DIAGNOSIS — N186 End stage renal disease: Secondary | ICD-10-CM | POA: Diagnosis not present

## 2022-05-19 DIAGNOSIS — Z992 Dependence on renal dialysis: Secondary | ICD-10-CM | POA: Diagnosis not present

## 2022-05-20 DIAGNOSIS — Z992 Dependence on renal dialysis: Secondary | ICD-10-CM | POA: Diagnosis not present

## 2022-05-20 DIAGNOSIS — N186 End stage renal disease: Secondary | ICD-10-CM | POA: Diagnosis not present

## 2022-05-21 DIAGNOSIS — N186 End stage renal disease: Secondary | ICD-10-CM | POA: Diagnosis not present

## 2022-05-21 DIAGNOSIS — Z992 Dependence on renal dialysis: Secondary | ICD-10-CM | POA: Diagnosis not present

## 2022-05-22 DIAGNOSIS — Z992 Dependence on renal dialysis: Secondary | ICD-10-CM | POA: Diagnosis not present

## 2022-05-22 DIAGNOSIS — N186 End stage renal disease: Secondary | ICD-10-CM | POA: Diagnosis not present

## 2022-05-23 DIAGNOSIS — Z992 Dependence on renal dialysis: Secondary | ICD-10-CM | POA: Diagnosis not present

## 2022-05-23 DIAGNOSIS — N186 End stage renal disease: Secondary | ICD-10-CM | POA: Diagnosis not present

## 2022-05-24 DIAGNOSIS — Z992 Dependence on renal dialysis: Secondary | ICD-10-CM | POA: Diagnosis not present

## 2022-05-24 DIAGNOSIS — N186 End stage renal disease: Secondary | ICD-10-CM | POA: Diagnosis not present

## 2022-05-25 DIAGNOSIS — Z992 Dependence on renal dialysis: Secondary | ICD-10-CM | POA: Diagnosis not present

## 2022-05-25 DIAGNOSIS — N186 End stage renal disease: Secondary | ICD-10-CM | POA: Diagnosis not present

## 2022-05-26 DIAGNOSIS — Z992 Dependence on renal dialysis: Secondary | ICD-10-CM | POA: Diagnosis not present

## 2022-05-26 DIAGNOSIS — N186 End stage renal disease: Secondary | ICD-10-CM | POA: Diagnosis not present

## 2022-05-27 DIAGNOSIS — N186 End stage renal disease: Secondary | ICD-10-CM | POA: Diagnosis not present

## 2022-05-27 DIAGNOSIS — Z992 Dependence on renal dialysis: Secondary | ICD-10-CM | POA: Diagnosis not present

## 2022-05-28 DIAGNOSIS — Z992 Dependence on renal dialysis: Secondary | ICD-10-CM | POA: Diagnosis not present

## 2022-05-28 DIAGNOSIS — N186 End stage renal disease: Secondary | ICD-10-CM | POA: Diagnosis not present

## 2022-05-29 DIAGNOSIS — Z992 Dependence on renal dialysis: Secondary | ICD-10-CM | POA: Diagnosis not present

## 2022-05-29 DIAGNOSIS — N186 End stage renal disease: Secondary | ICD-10-CM | POA: Diagnosis not present

## 2022-05-30 DIAGNOSIS — N186 End stage renal disease: Secondary | ICD-10-CM | POA: Diagnosis not present

## 2022-05-30 DIAGNOSIS — Z992 Dependence on renal dialysis: Secondary | ICD-10-CM | POA: Diagnosis not present

## 2022-05-31 DIAGNOSIS — Z992 Dependence on renal dialysis: Secondary | ICD-10-CM | POA: Diagnosis not present

## 2022-05-31 DIAGNOSIS — N186 End stage renal disease: Secondary | ICD-10-CM | POA: Diagnosis not present

## 2022-06-01 DIAGNOSIS — Z992 Dependence on renal dialysis: Secondary | ICD-10-CM | POA: Diagnosis not present

## 2022-06-01 DIAGNOSIS — N186 End stage renal disease: Secondary | ICD-10-CM | POA: Diagnosis not present

## 2022-06-02 DIAGNOSIS — N186 End stage renal disease: Secondary | ICD-10-CM | POA: Diagnosis not present

## 2022-06-02 DIAGNOSIS — Z992 Dependence on renal dialysis: Secondary | ICD-10-CM | POA: Diagnosis not present

## 2022-06-03 DIAGNOSIS — N186 End stage renal disease: Secondary | ICD-10-CM | POA: Diagnosis not present

## 2022-06-03 DIAGNOSIS — Z992 Dependence on renal dialysis: Secondary | ICD-10-CM | POA: Diagnosis not present

## 2022-06-04 DIAGNOSIS — Z992 Dependence on renal dialysis: Secondary | ICD-10-CM | POA: Diagnosis not present

## 2022-06-04 DIAGNOSIS — N186 End stage renal disease: Secondary | ICD-10-CM | POA: Diagnosis not present

## 2022-06-05 DIAGNOSIS — Z992 Dependence on renal dialysis: Secondary | ICD-10-CM | POA: Diagnosis not present

## 2022-06-05 DIAGNOSIS — N186 End stage renal disease: Secondary | ICD-10-CM | POA: Diagnosis not present

## 2022-06-06 DIAGNOSIS — N186 End stage renal disease: Secondary | ICD-10-CM | POA: Diagnosis not present

## 2022-06-06 DIAGNOSIS — Z992 Dependence on renal dialysis: Secondary | ICD-10-CM | POA: Diagnosis not present

## 2022-06-07 DIAGNOSIS — Z992 Dependence on renal dialysis: Secondary | ICD-10-CM | POA: Diagnosis not present

## 2022-06-07 DIAGNOSIS — N186 End stage renal disease: Secondary | ICD-10-CM | POA: Diagnosis not present

## 2022-06-08 DIAGNOSIS — Z992 Dependence on renal dialysis: Secondary | ICD-10-CM | POA: Diagnosis not present

## 2022-06-08 DIAGNOSIS — N186 End stage renal disease: Secondary | ICD-10-CM | POA: Diagnosis not present

## 2022-06-09 DIAGNOSIS — Z992 Dependence on renal dialysis: Secondary | ICD-10-CM | POA: Diagnosis not present

## 2022-06-09 DIAGNOSIS — N186 End stage renal disease: Secondary | ICD-10-CM | POA: Diagnosis not present

## 2022-06-10 DIAGNOSIS — Z992 Dependence on renal dialysis: Secondary | ICD-10-CM | POA: Diagnosis not present

## 2022-06-10 DIAGNOSIS — N186 End stage renal disease: Secondary | ICD-10-CM | POA: Diagnosis not present

## 2022-06-11 DIAGNOSIS — Z992 Dependence on renal dialysis: Secondary | ICD-10-CM | POA: Diagnosis not present

## 2022-06-11 DIAGNOSIS — N186 End stage renal disease: Secondary | ICD-10-CM | POA: Diagnosis not present

## 2022-06-12 DIAGNOSIS — N186 End stage renal disease: Secondary | ICD-10-CM | POA: Diagnosis not present

## 2022-06-12 DIAGNOSIS — Z992 Dependence on renal dialysis: Secondary | ICD-10-CM | POA: Diagnosis not present

## 2022-06-13 DIAGNOSIS — N186 End stage renal disease: Secondary | ICD-10-CM | POA: Diagnosis not present

## 2022-06-13 DIAGNOSIS — Z992 Dependence on renal dialysis: Secondary | ICD-10-CM | POA: Diagnosis not present

## 2022-06-14 DIAGNOSIS — Z992 Dependence on renal dialysis: Secondary | ICD-10-CM | POA: Diagnosis not present

## 2022-06-14 DIAGNOSIS — N186 End stage renal disease: Secondary | ICD-10-CM | POA: Diagnosis not present

## 2022-06-15 DIAGNOSIS — Z992 Dependence on renal dialysis: Secondary | ICD-10-CM | POA: Diagnosis not present

## 2022-06-15 DIAGNOSIS — N186 End stage renal disease: Secondary | ICD-10-CM | POA: Diagnosis not present

## 2022-06-16 DIAGNOSIS — Z992 Dependence on renal dialysis: Secondary | ICD-10-CM | POA: Diagnosis not present

## 2022-06-16 DIAGNOSIS — N186 End stage renal disease: Secondary | ICD-10-CM | POA: Diagnosis not present

## 2022-06-17 DIAGNOSIS — N186 End stage renal disease: Secondary | ICD-10-CM | POA: Diagnosis not present

## 2022-06-17 DIAGNOSIS — Z992 Dependence on renal dialysis: Secondary | ICD-10-CM | POA: Diagnosis not present

## 2022-06-18 DIAGNOSIS — Z992 Dependence on renal dialysis: Secondary | ICD-10-CM | POA: Diagnosis not present

## 2022-06-18 DIAGNOSIS — N186 End stage renal disease: Secondary | ICD-10-CM | POA: Diagnosis not present

## 2022-06-19 DIAGNOSIS — N186 End stage renal disease: Secondary | ICD-10-CM | POA: Diagnosis not present

## 2022-06-19 DIAGNOSIS — Z992 Dependence on renal dialysis: Secondary | ICD-10-CM | POA: Diagnosis not present

## 2022-06-20 DIAGNOSIS — Z992 Dependence on renal dialysis: Secondary | ICD-10-CM | POA: Diagnosis not present

## 2022-06-20 DIAGNOSIS — N186 End stage renal disease: Secondary | ICD-10-CM | POA: Diagnosis not present

## 2022-06-21 DIAGNOSIS — N186 End stage renal disease: Secondary | ICD-10-CM | POA: Diagnosis not present

## 2022-06-21 DIAGNOSIS — Z992 Dependence on renal dialysis: Secondary | ICD-10-CM | POA: Diagnosis not present

## 2022-06-22 DIAGNOSIS — Z992 Dependence on renal dialysis: Secondary | ICD-10-CM | POA: Diagnosis not present

## 2022-06-22 DIAGNOSIS — N186 End stage renal disease: Secondary | ICD-10-CM | POA: Diagnosis not present

## 2022-06-23 DIAGNOSIS — Z992 Dependence on renal dialysis: Secondary | ICD-10-CM | POA: Diagnosis not present

## 2022-06-23 DIAGNOSIS — N186 End stage renal disease: Secondary | ICD-10-CM | POA: Diagnosis not present

## 2022-06-24 DIAGNOSIS — Z992 Dependence on renal dialysis: Secondary | ICD-10-CM | POA: Diagnosis not present

## 2022-06-24 DIAGNOSIS — N186 End stage renal disease: Secondary | ICD-10-CM | POA: Diagnosis not present

## 2022-06-25 DIAGNOSIS — Z992 Dependence on renal dialysis: Secondary | ICD-10-CM | POA: Diagnosis not present

## 2022-06-25 DIAGNOSIS — N186 End stage renal disease: Secondary | ICD-10-CM | POA: Diagnosis not present

## 2022-06-26 DIAGNOSIS — Z992 Dependence on renal dialysis: Secondary | ICD-10-CM | POA: Diagnosis not present

## 2022-06-26 DIAGNOSIS — N186 End stage renal disease: Secondary | ICD-10-CM | POA: Diagnosis not present

## 2022-06-27 DIAGNOSIS — N186 End stage renal disease: Secondary | ICD-10-CM | POA: Diagnosis not present

## 2022-06-27 DIAGNOSIS — Z992 Dependence on renal dialysis: Secondary | ICD-10-CM | POA: Diagnosis not present

## 2022-06-28 DIAGNOSIS — Z992 Dependence on renal dialysis: Secondary | ICD-10-CM | POA: Diagnosis not present

## 2022-06-28 DIAGNOSIS — N186 End stage renal disease: Secondary | ICD-10-CM | POA: Diagnosis not present

## 2022-06-29 DIAGNOSIS — Z992 Dependence on renal dialysis: Secondary | ICD-10-CM | POA: Diagnosis not present

## 2022-06-29 DIAGNOSIS — N186 End stage renal disease: Secondary | ICD-10-CM | POA: Diagnosis not present

## 2022-06-30 DIAGNOSIS — Z992 Dependence on renal dialysis: Secondary | ICD-10-CM | POA: Diagnosis not present

## 2022-06-30 DIAGNOSIS — N186 End stage renal disease: Secondary | ICD-10-CM | POA: Diagnosis not present

## 2022-07-02 DIAGNOSIS — N186 End stage renal disease: Secondary | ICD-10-CM | POA: Diagnosis not present

## 2022-07-02 DIAGNOSIS — Z992 Dependence on renal dialysis: Secondary | ICD-10-CM | POA: Diagnosis not present

## 2022-07-03 DIAGNOSIS — Z992 Dependence on renal dialysis: Secondary | ICD-10-CM | POA: Diagnosis not present

## 2022-07-03 DIAGNOSIS — N186 End stage renal disease: Secondary | ICD-10-CM | POA: Diagnosis not present

## 2022-07-04 DIAGNOSIS — Z992 Dependence on renal dialysis: Secondary | ICD-10-CM | POA: Diagnosis not present

## 2022-07-04 DIAGNOSIS — N186 End stage renal disease: Secondary | ICD-10-CM | POA: Diagnosis not present

## 2022-07-05 DIAGNOSIS — N186 End stage renal disease: Secondary | ICD-10-CM | POA: Diagnosis not present

## 2022-07-05 DIAGNOSIS — Z992 Dependence on renal dialysis: Secondary | ICD-10-CM | POA: Diagnosis not present

## 2022-07-06 DIAGNOSIS — N186 End stage renal disease: Secondary | ICD-10-CM | POA: Diagnosis not present

## 2022-07-06 DIAGNOSIS — Z992 Dependence on renal dialysis: Secondary | ICD-10-CM | POA: Diagnosis not present

## 2022-07-07 DIAGNOSIS — Z992 Dependence on renal dialysis: Secondary | ICD-10-CM | POA: Diagnosis not present

## 2022-07-07 DIAGNOSIS — N186 End stage renal disease: Secondary | ICD-10-CM | POA: Diagnosis not present

## 2022-07-08 DIAGNOSIS — N186 End stage renal disease: Secondary | ICD-10-CM | POA: Diagnosis not present

## 2022-07-08 DIAGNOSIS — Z992 Dependence on renal dialysis: Secondary | ICD-10-CM | POA: Diagnosis not present

## 2022-07-09 DIAGNOSIS — N186 End stage renal disease: Secondary | ICD-10-CM | POA: Diagnosis not present

## 2022-07-09 DIAGNOSIS — Z992 Dependence on renal dialysis: Secondary | ICD-10-CM | POA: Diagnosis not present

## 2022-07-10 DIAGNOSIS — N186 End stage renal disease: Secondary | ICD-10-CM | POA: Diagnosis not present

## 2022-07-10 DIAGNOSIS — Z992 Dependence on renal dialysis: Secondary | ICD-10-CM | POA: Diagnosis not present

## 2022-07-11 DIAGNOSIS — Z992 Dependence on renal dialysis: Secondary | ICD-10-CM | POA: Diagnosis not present

## 2022-07-11 DIAGNOSIS — N186 End stage renal disease: Secondary | ICD-10-CM | POA: Diagnosis not present

## 2022-07-12 DIAGNOSIS — Z992 Dependence on renal dialysis: Secondary | ICD-10-CM | POA: Diagnosis not present

## 2022-07-12 DIAGNOSIS — N186 End stage renal disease: Secondary | ICD-10-CM | POA: Diagnosis not present

## 2022-07-13 DIAGNOSIS — N186 End stage renal disease: Secondary | ICD-10-CM | POA: Diagnosis not present

## 2022-07-13 DIAGNOSIS — Z992 Dependence on renal dialysis: Secondary | ICD-10-CM | POA: Diagnosis not present

## 2022-07-14 DIAGNOSIS — Z992 Dependence on renal dialysis: Secondary | ICD-10-CM | POA: Diagnosis not present

## 2022-07-14 DIAGNOSIS — N186 End stage renal disease: Secondary | ICD-10-CM | POA: Diagnosis not present

## 2022-07-15 DIAGNOSIS — N186 End stage renal disease: Secondary | ICD-10-CM | POA: Diagnosis not present

## 2022-07-15 DIAGNOSIS — Z992 Dependence on renal dialysis: Secondary | ICD-10-CM | POA: Diagnosis not present

## 2022-07-16 DIAGNOSIS — Z992 Dependence on renal dialysis: Secondary | ICD-10-CM | POA: Diagnosis not present

## 2022-07-16 DIAGNOSIS — N186 End stage renal disease: Secondary | ICD-10-CM | POA: Diagnosis not present

## 2022-07-17 DIAGNOSIS — Z992 Dependence on renal dialysis: Secondary | ICD-10-CM | POA: Diagnosis not present

## 2022-07-17 DIAGNOSIS — N186 End stage renal disease: Secondary | ICD-10-CM | POA: Diagnosis not present

## 2022-07-18 DIAGNOSIS — N186 End stage renal disease: Secondary | ICD-10-CM | POA: Diagnosis not present

## 2022-07-18 DIAGNOSIS — Z992 Dependence on renal dialysis: Secondary | ICD-10-CM | POA: Diagnosis not present

## 2022-07-19 DIAGNOSIS — N186 End stage renal disease: Secondary | ICD-10-CM | POA: Diagnosis not present

## 2022-07-19 DIAGNOSIS — Z992 Dependence on renal dialysis: Secondary | ICD-10-CM | POA: Diagnosis not present

## 2022-07-20 DIAGNOSIS — Z992 Dependence on renal dialysis: Secondary | ICD-10-CM | POA: Diagnosis not present

## 2022-07-20 DIAGNOSIS — N186 End stage renal disease: Secondary | ICD-10-CM | POA: Diagnosis not present

## 2022-07-21 DIAGNOSIS — Z992 Dependence on renal dialysis: Secondary | ICD-10-CM | POA: Diagnosis not present

## 2022-07-21 DIAGNOSIS — N186 End stage renal disease: Secondary | ICD-10-CM | POA: Diagnosis not present

## 2022-07-22 DIAGNOSIS — L723 Sebaceous cyst: Secondary | ICD-10-CM | POA: Diagnosis not present

## 2022-07-22 DIAGNOSIS — Z23 Encounter for immunization: Secondary | ICD-10-CM | POA: Diagnosis not present

## 2022-07-22 DIAGNOSIS — Z0000 Encounter for general adult medical examination without abnormal findings: Secondary | ICD-10-CM | POA: Diagnosis not present

## 2022-07-22 DIAGNOSIS — M3214 Glomerular disease in systemic lupus erythematosus: Secondary | ICD-10-CM | POA: Diagnosis not present

## 2022-07-22 DIAGNOSIS — I151 Hypertension secondary to other renal disorders: Secondary | ICD-10-CM | POA: Diagnosis not present

## 2022-07-22 DIAGNOSIS — Z992 Dependence on renal dialysis: Secondary | ICD-10-CM | POA: Diagnosis not present

## 2022-07-22 DIAGNOSIS — N186 End stage renal disease: Secondary | ICD-10-CM | POA: Diagnosis not present

## 2022-07-23 DIAGNOSIS — N186 End stage renal disease: Secondary | ICD-10-CM | POA: Diagnosis not present

## 2022-07-23 DIAGNOSIS — Z992 Dependence on renal dialysis: Secondary | ICD-10-CM | POA: Diagnosis not present

## 2022-07-24 DIAGNOSIS — Z992 Dependence on renal dialysis: Secondary | ICD-10-CM | POA: Diagnosis not present

## 2022-07-24 DIAGNOSIS — N186 End stage renal disease: Secondary | ICD-10-CM | POA: Diagnosis not present

## 2022-07-25 DIAGNOSIS — Z992 Dependence on renal dialysis: Secondary | ICD-10-CM | POA: Diagnosis not present

## 2022-07-25 DIAGNOSIS — N186 End stage renal disease: Secondary | ICD-10-CM | POA: Diagnosis not present

## 2022-07-26 DIAGNOSIS — Z992 Dependence on renal dialysis: Secondary | ICD-10-CM | POA: Diagnosis not present

## 2022-07-26 DIAGNOSIS — N186 End stage renal disease: Secondary | ICD-10-CM | POA: Diagnosis not present

## 2022-07-27 DIAGNOSIS — N186 End stage renal disease: Secondary | ICD-10-CM | POA: Diagnosis not present

## 2022-07-27 DIAGNOSIS — Z992 Dependence on renal dialysis: Secondary | ICD-10-CM | POA: Diagnosis not present

## 2022-07-28 DIAGNOSIS — Z992 Dependence on renal dialysis: Secondary | ICD-10-CM | POA: Diagnosis not present

## 2022-07-28 DIAGNOSIS — N186 End stage renal disease: Secondary | ICD-10-CM | POA: Diagnosis not present

## 2022-07-29 DIAGNOSIS — N186 End stage renal disease: Secondary | ICD-10-CM | POA: Diagnosis not present

## 2022-07-29 DIAGNOSIS — Z992 Dependence on renal dialysis: Secondary | ICD-10-CM | POA: Diagnosis not present

## 2022-07-30 DIAGNOSIS — Z992 Dependence on renal dialysis: Secondary | ICD-10-CM | POA: Diagnosis not present

## 2022-07-30 DIAGNOSIS — N186 End stage renal disease: Secondary | ICD-10-CM | POA: Diagnosis not present

## 2022-07-31 DIAGNOSIS — Z992 Dependence on renal dialysis: Secondary | ICD-10-CM | POA: Diagnosis not present

## 2022-07-31 DIAGNOSIS — N186 End stage renal disease: Secondary | ICD-10-CM | POA: Diagnosis not present

## 2022-08-01 DIAGNOSIS — Z992 Dependence on renal dialysis: Secondary | ICD-10-CM | POA: Diagnosis not present

## 2022-08-01 DIAGNOSIS — N186 End stage renal disease: Secondary | ICD-10-CM | POA: Diagnosis not present

## 2022-08-02 DIAGNOSIS — N186 End stage renal disease: Secondary | ICD-10-CM | POA: Diagnosis not present

## 2022-08-02 DIAGNOSIS — Z992 Dependence on renal dialysis: Secondary | ICD-10-CM | POA: Diagnosis not present

## 2022-08-03 DIAGNOSIS — Z992 Dependence on renal dialysis: Secondary | ICD-10-CM | POA: Diagnosis not present

## 2022-08-03 DIAGNOSIS — N186 End stage renal disease: Secondary | ICD-10-CM | POA: Diagnosis not present

## 2022-08-04 DIAGNOSIS — N186 End stage renal disease: Secondary | ICD-10-CM | POA: Diagnosis not present

## 2022-08-04 DIAGNOSIS — Z992 Dependence on renal dialysis: Secondary | ICD-10-CM | POA: Diagnosis not present

## 2022-08-05 DIAGNOSIS — N186 End stage renal disease: Secondary | ICD-10-CM | POA: Diagnosis not present

## 2022-08-05 DIAGNOSIS — Z992 Dependence on renal dialysis: Secondary | ICD-10-CM | POA: Diagnosis not present

## 2022-08-06 DIAGNOSIS — Z992 Dependence on renal dialysis: Secondary | ICD-10-CM | POA: Diagnosis not present

## 2022-08-06 DIAGNOSIS — N186 End stage renal disease: Secondary | ICD-10-CM | POA: Diagnosis not present

## 2022-08-07 ENCOUNTER — Other Ambulatory Visit: Payer: Self-pay | Admitting: Family Medicine

## 2022-08-07 DIAGNOSIS — N186 End stage renal disease: Secondary | ICD-10-CM | POA: Diagnosis not present

## 2022-08-07 DIAGNOSIS — Z992 Dependence on renal dialysis: Secondary | ICD-10-CM | POA: Diagnosis not present

## 2022-08-07 DIAGNOSIS — Z1231 Encounter for screening mammogram for malignant neoplasm of breast: Secondary | ICD-10-CM

## 2022-08-08 DIAGNOSIS — N186 End stage renal disease: Secondary | ICD-10-CM | POA: Diagnosis not present

## 2022-08-08 DIAGNOSIS — Z992 Dependence on renal dialysis: Secondary | ICD-10-CM | POA: Diagnosis not present

## 2022-08-09 DIAGNOSIS — N186 End stage renal disease: Secondary | ICD-10-CM | POA: Diagnosis not present

## 2022-08-09 DIAGNOSIS — Z992 Dependence on renal dialysis: Secondary | ICD-10-CM | POA: Diagnosis not present

## 2022-08-10 DIAGNOSIS — N186 End stage renal disease: Secondary | ICD-10-CM | POA: Diagnosis not present

## 2022-08-10 DIAGNOSIS — Z992 Dependence on renal dialysis: Secondary | ICD-10-CM | POA: Diagnosis not present

## 2022-08-11 DIAGNOSIS — Z992 Dependence on renal dialysis: Secondary | ICD-10-CM | POA: Diagnosis not present

## 2022-08-11 DIAGNOSIS — N186 End stage renal disease: Secondary | ICD-10-CM | POA: Diagnosis not present

## 2022-08-12 DIAGNOSIS — N186 End stage renal disease: Secondary | ICD-10-CM | POA: Diagnosis not present

## 2022-08-12 DIAGNOSIS — Z992 Dependence on renal dialysis: Secondary | ICD-10-CM | POA: Diagnosis not present

## 2022-08-13 DIAGNOSIS — Z992 Dependence on renal dialysis: Secondary | ICD-10-CM | POA: Diagnosis not present

## 2022-08-13 DIAGNOSIS — N186 End stage renal disease: Secondary | ICD-10-CM | POA: Diagnosis not present

## 2022-08-14 DIAGNOSIS — Z992 Dependence on renal dialysis: Secondary | ICD-10-CM | POA: Diagnosis not present

## 2022-08-14 DIAGNOSIS — N186 End stage renal disease: Secondary | ICD-10-CM | POA: Diagnosis not present

## 2022-08-15 DIAGNOSIS — Z992 Dependence on renal dialysis: Secondary | ICD-10-CM | POA: Diagnosis not present

## 2022-08-15 DIAGNOSIS — N186 End stage renal disease: Secondary | ICD-10-CM | POA: Diagnosis not present

## 2022-08-16 DIAGNOSIS — N186 End stage renal disease: Secondary | ICD-10-CM | POA: Diagnosis not present

## 2022-08-16 DIAGNOSIS — Z992 Dependence on renal dialysis: Secondary | ICD-10-CM | POA: Diagnosis not present

## 2022-08-17 DIAGNOSIS — Z992 Dependence on renal dialysis: Secondary | ICD-10-CM | POA: Diagnosis not present

## 2022-08-17 DIAGNOSIS — N186 End stage renal disease: Secondary | ICD-10-CM | POA: Diagnosis not present

## 2022-08-18 DIAGNOSIS — N186 End stage renal disease: Secondary | ICD-10-CM | POA: Diagnosis not present

## 2022-08-18 DIAGNOSIS — Z992 Dependence on renal dialysis: Secondary | ICD-10-CM | POA: Diagnosis not present

## 2022-08-19 DIAGNOSIS — N186 End stage renal disease: Secondary | ICD-10-CM | POA: Diagnosis not present

## 2022-08-19 DIAGNOSIS — Z992 Dependence on renal dialysis: Secondary | ICD-10-CM | POA: Diagnosis not present

## 2022-08-20 DIAGNOSIS — Z992 Dependence on renal dialysis: Secondary | ICD-10-CM | POA: Diagnosis not present

## 2022-08-20 DIAGNOSIS — N186 End stage renal disease: Secondary | ICD-10-CM | POA: Diagnosis not present

## 2022-08-21 DIAGNOSIS — Z992 Dependence on renal dialysis: Secondary | ICD-10-CM | POA: Diagnosis not present

## 2022-08-21 DIAGNOSIS — N186 End stage renal disease: Secondary | ICD-10-CM | POA: Diagnosis not present

## 2022-08-22 DIAGNOSIS — Z992 Dependence on renal dialysis: Secondary | ICD-10-CM | POA: Diagnosis not present

## 2022-08-22 DIAGNOSIS — N186 End stage renal disease: Secondary | ICD-10-CM | POA: Diagnosis not present

## 2022-08-23 DIAGNOSIS — N186 End stage renal disease: Secondary | ICD-10-CM | POA: Diagnosis not present

## 2022-08-23 DIAGNOSIS — Z992 Dependence on renal dialysis: Secondary | ICD-10-CM | POA: Diagnosis not present

## 2022-08-24 DIAGNOSIS — Z992 Dependence on renal dialysis: Secondary | ICD-10-CM | POA: Diagnosis not present

## 2022-08-24 DIAGNOSIS — N186 End stage renal disease: Secondary | ICD-10-CM | POA: Diagnosis not present

## 2022-08-25 DIAGNOSIS — Z992 Dependence on renal dialysis: Secondary | ICD-10-CM | POA: Diagnosis not present

## 2022-08-25 DIAGNOSIS — N186 End stage renal disease: Secondary | ICD-10-CM | POA: Diagnosis not present

## 2022-08-26 DIAGNOSIS — N186 End stage renal disease: Secondary | ICD-10-CM | POA: Diagnosis not present

## 2022-08-26 DIAGNOSIS — Z992 Dependence on renal dialysis: Secondary | ICD-10-CM | POA: Diagnosis not present

## 2022-08-27 DIAGNOSIS — N186 End stage renal disease: Secondary | ICD-10-CM | POA: Diagnosis not present

## 2022-08-27 DIAGNOSIS — I151 Hypertension secondary to other renal disorders: Secondary | ICD-10-CM | POA: Diagnosis not present

## 2022-08-27 DIAGNOSIS — Z992 Dependence on renal dialysis: Secondary | ICD-10-CM | POA: Diagnosis not present

## 2022-08-27 DIAGNOSIS — M3214 Glomerular disease in systemic lupus erythematosus: Secondary | ICD-10-CM | POA: Diagnosis not present

## 2022-08-28 DIAGNOSIS — Z992 Dependence on renal dialysis: Secondary | ICD-10-CM | POA: Diagnosis not present

## 2022-08-28 DIAGNOSIS — N186 End stage renal disease: Secondary | ICD-10-CM | POA: Diagnosis not present

## 2022-08-29 DIAGNOSIS — Z992 Dependence on renal dialysis: Secondary | ICD-10-CM | POA: Diagnosis not present

## 2022-08-29 DIAGNOSIS — N186 End stage renal disease: Secondary | ICD-10-CM | POA: Diagnosis not present

## 2022-08-30 DIAGNOSIS — N186 End stage renal disease: Secondary | ICD-10-CM | POA: Diagnosis not present

## 2022-08-30 DIAGNOSIS — Z992 Dependence on renal dialysis: Secondary | ICD-10-CM | POA: Diagnosis not present

## 2022-08-31 DIAGNOSIS — Z992 Dependence on renal dialysis: Secondary | ICD-10-CM | POA: Diagnosis not present

## 2022-08-31 DIAGNOSIS — N186 End stage renal disease: Secondary | ICD-10-CM | POA: Diagnosis not present

## 2022-09-01 DIAGNOSIS — N186 End stage renal disease: Secondary | ICD-10-CM | POA: Diagnosis not present

## 2022-09-01 DIAGNOSIS — Z992 Dependence on renal dialysis: Secondary | ICD-10-CM | POA: Diagnosis not present

## 2022-09-02 DIAGNOSIS — N186 End stage renal disease: Secondary | ICD-10-CM | POA: Diagnosis not present

## 2022-09-02 DIAGNOSIS — Z992 Dependence on renal dialysis: Secondary | ICD-10-CM | POA: Diagnosis not present

## 2022-09-03 DIAGNOSIS — N186 End stage renal disease: Secondary | ICD-10-CM | POA: Diagnosis not present

## 2022-09-03 DIAGNOSIS — Z992 Dependence on renal dialysis: Secondary | ICD-10-CM | POA: Diagnosis not present

## 2022-09-04 DIAGNOSIS — N186 End stage renal disease: Secondary | ICD-10-CM | POA: Diagnosis not present

## 2022-09-04 DIAGNOSIS — Z992 Dependence on renal dialysis: Secondary | ICD-10-CM | POA: Diagnosis not present

## 2022-09-05 DIAGNOSIS — N186 End stage renal disease: Secondary | ICD-10-CM | POA: Diagnosis not present

## 2022-09-05 DIAGNOSIS — Z992 Dependence on renal dialysis: Secondary | ICD-10-CM | POA: Diagnosis not present

## 2022-09-06 DIAGNOSIS — Z992 Dependence on renal dialysis: Secondary | ICD-10-CM | POA: Diagnosis not present

## 2022-09-06 DIAGNOSIS — N186 End stage renal disease: Secondary | ICD-10-CM | POA: Diagnosis not present

## 2022-09-07 DIAGNOSIS — N186 End stage renal disease: Secondary | ICD-10-CM | POA: Diagnosis not present

## 2022-09-07 DIAGNOSIS — Z992 Dependence on renal dialysis: Secondary | ICD-10-CM | POA: Diagnosis not present

## 2022-09-08 DIAGNOSIS — Z992 Dependence on renal dialysis: Secondary | ICD-10-CM | POA: Diagnosis not present

## 2022-09-08 DIAGNOSIS — N186 End stage renal disease: Secondary | ICD-10-CM | POA: Diagnosis not present

## 2022-09-09 DIAGNOSIS — Z992 Dependence on renal dialysis: Secondary | ICD-10-CM | POA: Diagnosis not present

## 2022-09-09 DIAGNOSIS — N186 End stage renal disease: Secondary | ICD-10-CM | POA: Diagnosis not present

## 2022-09-10 DIAGNOSIS — N186 End stage renal disease: Secondary | ICD-10-CM | POA: Diagnosis not present

## 2022-09-10 DIAGNOSIS — Z992 Dependence on renal dialysis: Secondary | ICD-10-CM | POA: Diagnosis not present

## 2022-09-11 DIAGNOSIS — Z992 Dependence on renal dialysis: Secondary | ICD-10-CM | POA: Diagnosis not present

## 2022-09-11 DIAGNOSIS — N186 End stage renal disease: Secondary | ICD-10-CM | POA: Diagnosis not present

## 2022-09-12 DIAGNOSIS — Z992 Dependence on renal dialysis: Secondary | ICD-10-CM | POA: Diagnosis not present

## 2022-09-12 DIAGNOSIS — N186 End stage renal disease: Secondary | ICD-10-CM | POA: Diagnosis not present

## 2022-09-13 DIAGNOSIS — Z992 Dependence on renal dialysis: Secondary | ICD-10-CM | POA: Diagnosis not present

## 2022-09-13 DIAGNOSIS — N186 End stage renal disease: Secondary | ICD-10-CM | POA: Diagnosis not present

## 2022-09-14 DIAGNOSIS — N186 End stage renal disease: Secondary | ICD-10-CM | POA: Diagnosis not present

## 2022-09-14 DIAGNOSIS — Z992 Dependence on renal dialysis: Secondary | ICD-10-CM | POA: Diagnosis not present

## 2022-09-15 ENCOUNTER — Ambulatory Visit
Admission: RE | Admit: 2022-09-15 | Discharge: 2022-09-15 | Disposition: A | Discharge status: Home / Self Care | Payer: Medicare Other | Source: Ambulatory Visit | Attending: Family Medicine | Admitting: Family Medicine

## 2022-09-15 DIAGNOSIS — Z1231 Encounter for screening mammogram for malignant neoplasm of breast: Secondary | ICD-10-CM

## 2022-09-15 DIAGNOSIS — Z992 Dependence on renal dialysis: Secondary | ICD-10-CM | POA: Diagnosis not present

## 2022-09-15 DIAGNOSIS — N186 End stage renal disease: Secondary | ICD-10-CM | POA: Diagnosis not present

## 2022-09-16 DIAGNOSIS — Z992 Dependence on renal dialysis: Secondary | ICD-10-CM | POA: Diagnosis not present

## 2022-09-16 DIAGNOSIS — N186 End stage renal disease: Secondary | ICD-10-CM | POA: Diagnosis not present

## 2022-09-17 DIAGNOSIS — Z992 Dependence on renal dialysis: Secondary | ICD-10-CM | POA: Diagnosis not present

## 2022-09-17 DIAGNOSIS — N186 End stage renal disease: Secondary | ICD-10-CM | POA: Diagnosis not present

## 2022-09-18 DIAGNOSIS — N186 End stage renal disease: Secondary | ICD-10-CM | POA: Diagnosis not present

## 2022-09-18 DIAGNOSIS — Z992 Dependence on renal dialysis: Secondary | ICD-10-CM | POA: Diagnosis not present

## 2022-09-19 DIAGNOSIS — Z992 Dependence on renal dialysis: Secondary | ICD-10-CM | POA: Diagnosis not present

## 2022-09-19 DIAGNOSIS — N186 End stage renal disease: Secondary | ICD-10-CM | POA: Diagnosis not present

## 2022-09-20 DIAGNOSIS — N186 End stage renal disease: Secondary | ICD-10-CM | POA: Diagnosis not present

## 2022-09-20 DIAGNOSIS — Z992 Dependence on renal dialysis: Secondary | ICD-10-CM | POA: Diagnosis not present

## 2022-09-21 DIAGNOSIS — Z992 Dependence on renal dialysis: Secondary | ICD-10-CM | POA: Diagnosis not present

## 2022-09-21 DIAGNOSIS — N186 End stage renal disease: Secondary | ICD-10-CM | POA: Diagnosis not present

## 2022-09-22 DIAGNOSIS — N186 End stage renal disease: Secondary | ICD-10-CM | POA: Diagnosis not present

## 2022-09-22 DIAGNOSIS — Z992 Dependence on renal dialysis: Secondary | ICD-10-CM | POA: Diagnosis not present

## 2022-09-23 DIAGNOSIS — Z992 Dependence on renal dialysis: Secondary | ICD-10-CM | POA: Diagnosis not present

## 2022-09-23 DIAGNOSIS — N186 End stage renal disease: Secondary | ICD-10-CM | POA: Diagnosis not present

## 2022-09-24 DIAGNOSIS — Z992 Dependence on renal dialysis: Secondary | ICD-10-CM | POA: Diagnosis not present

## 2022-09-24 DIAGNOSIS — N186 End stage renal disease: Secondary | ICD-10-CM | POA: Diagnosis not present

## 2022-09-25 DIAGNOSIS — Z992 Dependence on renal dialysis: Secondary | ICD-10-CM | POA: Diagnosis not present

## 2022-09-25 DIAGNOSIS — N186 End stage renal disease: Secondary | ICD-10-CM | POA: Diagnosis not present

## 2022-09-26 DIAGNOSIS — N186 End stage renal disease: Secondary | ICD-10-CM | POA: Diagnosis not present

## 2022-09-26 DIAGNOSIS — Z992 Dependence on renal dialysis: Secondary | ICD-10-CM | POA: Diagnosis not present

## 2022-09-27 DIAGNOSIS — Z992 Dependence on renal dialysis: Secondary | ICD-10-CM | POA: Diagnosis not present

## 2022-09-27 DIAGNOSIS — N186 End stage renal disease: Secondary | ICD-10-CM | POA: Diagnosis not present

## 2022-09-28 DIAGNOSIS — Z992 Dependence on renal dialysis: Secondary | ICD-10-CM | POA: Diagnosis not present

## 2022-09-28 DIAGNOSIS — N186 End stage renal disease: Secondary | ICD-10-CM | POA: Diagnosis not present

## 2022-09-29 DIAGNOSIS — Z992 Dependence on renal dialysis: Secondary | ICD-10-CM | POA: Diagnosis not present

## 2022-09-29 DIAGNOSIS — N186 End stage renal disease: Secondary | ICD-10-CM | POA: Diagnosis not present

## 2022-09-30 DIAGNOSIS — N186 End stage renal disease: Secondary | ICD-10-CM | POA: Diagnosis not present

## 2022-09-30 DIAGNOSIS — Z992 Dependence on renal dialysis: Secondary | ICD-10-CM | POA: Diagnosis not present

## 2022-10-01 DIAGNOSIS — Z992 Dependence on renal dialysis: Secondary | ICD-10-CM | POA: Diagnosis not present

## 2022-10-01 DIAGNOSIS — N186 End stage renal disease: Secondary | ICD-10-CM | POA: Diagnosis not present

## 2022-10-02 DIAGNOSIS — N186 End stage renal disease: Secondary | ICD-10-CM | POA: Diagnosis not present

## 2022-10-02 DIAGNOSIS — Z992 Dependence on renal dialysis: Secondary | ICD-10-CM | POA: Diagnosis not present

## 2022-10-03 DIAGNOSIS — Z992 Dependence on renal dialysis: Secondary | ICD-10-CM | POA: Diagnosis not present

## 2022-10-03 DIAGNOSIS — R880 Cloudy (hemodialysis) (peritoneal) dialysis effluent: Secondary | ICD-10-CM | POA: Diagnosis not present

## 2022-10-03 DIAGNOSIS — R197 Diarrhea, unspecified: Secondary | ICD-10-CM | POA: Diagnosis not present

## 2022-10-03 DIAGNOSIS — N186 End stage renal disease: Secondary | ICD-10-CM | POA: Diagnosis not present

## 2022-10-03 DIAGNOSIS — R109 Unspecified abdominal pain: Secondary | ICD-10-CM | POA: Diagnosis not present

## 2022-10-04 DIAGNOSIS — N186 End stage renal disease: Secondary | ICD-10-CM | POA: Diagnosis not present

## 2022-10-04 DIAGNOSIS — R109 Unspecified abdominal pain: Secondary | ICD-10-CM | POA: Diagnosis not present

## 2022-10-04 DIAGNOSIS — R197 Diarrhea, unspecified: Secondary | ICD-10-CM | POA: Diagnosis not present

## 2022-10-04 DIAGNOSIS — Z992 Dependence on renal dialysis: Secondary | ICD-10-CM | POA: Diagnosis not present

## 2022-10-04 DIAGNOSIS — R880 Cloudy (hemodialysis) (peritoneal) dialysis effluent: Secondary | ICD-10-CM | POA: Diagnosis not present

## 2022-10-05 DIAGNOSIS — R109 Unspecified abdominal pain: Secondary | ICD-10-CM | POA: Diagnosis not present

## 2022-10-05 DIAGNOSIS — R197 Diarrhea, unspecified: Secondary | ICD-10-CM | POA: Diagnosis not present

## 2022-10-05 DIAGNOSIS — R880 Cloudy (hemodialysis) (peritoneal) dialysis effluent: Secondary | ICD-10-CM | POA: Diagnosis not present

## 2022-10-05 DIAGNOSIS — N186 End stage renal disease: Secondary | ICD-10-CM | POA: Diagnosis not present

## 2022-10-05 DIAGNOSIS — Z992 Dependence on renal dialysis: Secondary | ICD-10-CM | POA: Diagnosis not present

## 2022-10-06 DIAGNOSIS — R197 Diarrhea, unspecified: Secondary | ICD-10-CM | POA: Diagnosis not present

## 2022-10-06 DIAGNOSIS — R880 Cloudy (hemodialysis) (peritoneal) dialysis effluent: Secondary | ICD-10-CM | POA: Diagnosis not present

## 2022-10-06 DIAGNOSIS — Z992 Dependence on renal dialysis: Secondary | ICD-10-CM | POA: Diagnosis not present

## 2022-10-06 DIAGNOSIS — R109 Unspecified abdominal pain: Secondary | ICD-10-CM | POA: Diagnosis not present

## 2022-10-06 DIAGNOSIS — N186 End stage renal disease: Secondary | ICD-10-CM | POA: Diagnosis not present

## 2022-10-07 DIAGNOSIS — R109 Unspecified abdominal pain: Secondary | ICD-10-CM | POA: Diagnosis not present

## 2022-10-07 DIAGNOSIS — N186 End stage renal disease: Secondary | ICD-10-CM | POA: Diagnosis not present

## 2022-10-07 DIAGNOSIS — Z992 Dependence on renal dialysis: Secondary | ICD-10-CM | POA: Diagnosis not present

## 2022-10-07 DIAGNOSIS — R880 Cloudy (hemodialysis) (peritoneal) dialysis effluent: Secondary | ICD-10-CM | POA: Diagnosis not present

## 2022-10-07 DIAGNOSIS — R197 Diarrhea, unspecified: Secondary | ICD-10-CM | POA: Diagnosis not present

## 2022-10-08 DIAGNOSIS — R880 Cloudy (hemodialysis) (peritoneal) dialysis effluent: Secondary | ICD-10-CM | POA: Diagnosis not present

## 2022-10-08 DIAGNOSIS — N186 End stage renal disease: Secondary | ICD-10-CM | POA: Diagnosis not present

## 2022-10-08 DIAGNOSIS — Z992 Dependence on renal dialysis: Secondary | ICD-10-CM | POA: Diagnosis not present

## 2022-10-08 DIAGNOSIS — R197 Diarrhea, unspecified: Secondary | ICD-10-CM | POA: Diagnosis not present

## 2022-10-08 DIAGNOSIS — R109 Unspecified abdominal pain: Secondary | ICD-10-CM | POA: Diagnosis not present

## 2022-10-09 DIAGNOSIS — Z992 Dependence on renal dialysis: Secondary | ICD-10-CM | POA: Diagnosis not present

## 2022-10-09 DIAGNOSIS — N186 End stage renal disease: Secondary | ICD-10-CM | POA: Diagnosis not present

## 2022-10-09 DIAGNOSIS — R109 Unspecified abdominal pain: Secondary | ICD-10-CM | POA: Diagnosis not present

## 2022-10-09 DIAGNOSIS — R880 Cloudy (hemodialysis) (peritoneal) dialysis effluent: Secondary | ICD-10-CM | POA: Diagnosis not present

## 2022-10-09 DIAGNOSIS — R197 Diarrhea, unspecified: Secondary | ICD-10-CM | POA: Diagnosis not present

## 2022-10-10 DIAGNOSIS — R880 Cloudy (hemodialysis) (peritoneal) dialysis effluent: Secondary | ICD-10-CM | POA: Diagnosis not present

## 2022-10-10 DIAGNOSIS — N186 End stage renal disease: Secondary | ICD-10-CM | POA: Diagnosis not present

## 2022-10-10 DIAGNOSIS — Z992 Dependence on renal dialysis: Secondary | ICD-10-CM | POA: Diagnosis not present

## 2022-10-10 DIAGNOSIS — R197 Diarrhea, unspecified: Secondary | ICD-10-CM | POA: Diagnosis not present

## 2022-10-10 DIAGNOSIS — R109 Unspecified abdominal pain: Secondary | ICD-10-CM | POA: Diagnosis not present

## 2022-10-11 DIAGNOSIS — Z992 Dependence on renal dialysis: Secondary | ICD-10-CM | POA: Diagnosis not present

## 2022-10-11 DIAGNOSIS — N186 End stage renal disease: Secondary | ICD-10-CM | POA: Diagnosis not present

## 2022-10-11 DIAGNOSIS — R880 Cloudy (hemodialysis) (peritoneal) dialysis effluent: Secondary | ICD-10-CM | POA: Diagnosis not present

## 2022-10-11 DIAGNOSIS — R197 Diarrhea, unspecified: Secondary | ICD-10-CM | POA: Diagnosis not present

## 2022-10-11 DIAGNOSIS — R109 Unspecified abdominal pain: Secondary | ICD-10-CM | POA: Diagnosis not present

## 2022-10-12 DIAGNOSIS — N186 End stage renal disease: Secondary | ICD-10-CM | POA: Diagnosis not present

## 2022-10-12 DIAGNOSIS — Z992 Dependence on renal dialysis: Secondary | ICD-10-CM | POA: Diagnosis not present

## 2022-10-12 DIAGNOSIS — R880 Cloudy (hemodialysis) (peritoneal) dialysis effluent: Secondary | ICD-10-CM | POA: Diagnosis not present

## 2022-10-12 DIAGNOSIS — R109 Unspecified abdominal pain: Secondary | ICD-10-CM | POA: Diagnosis not present

## 2022-10-12 DIAGNOSIS — R197 Diarrhea, unspecified: Secondary | ICD-10-CM | POA: Diagnosis not present

## 2022-10-14 DIAGNOSIS — N186 End stage renal disease: Secondary | ICD-10-CM | POA: Diagnosis not present

## 2022-10-14 DIAGNOSIS — R197 Diarrhea, unspecified: Secondary | ICD-10-CM | POA: Diagnosis not present

## 2022-10-14 DIAGNOSIS — R880 Cloudy (hemodialysis) (peritoneal) dialysis effluent: Secondary | ICD-10-CM | POA: Diagnosis not present

## 2022-10-14 DIAGNOSIS — R509 Fever, unspecified: Secondary | ICD-10-CM | POA: Diagnosis not present

## 2022-10-14 DIAGNOSIS — R109 Unspecified abdominal pain: Secondary | ICD-10-CM | POA: Diagnosis not present

## 2022-10-14 DIAGNOSIS — R110 Nausea: Secondary | ICD-10-CM | POA: Diagnosis not present

## 2022-10-14 DIAGNOSIS — Z992 Dependence on renal dialysis: Secondary | ICD-10-CM | POA: Diagnosis not present

## 2022-10-15 ENCOUNTER — Inpatient Hospital Stay (HOSPITAL_COMMUNITY)
Admission: EM | Admit: 2022-10-15 | Discharge: 2022-11-06 | DRG: 907 | Disposition: A | Discharge status: Skilled Nursing Facility | Payer: Medicare Other | Attending: Internal Medicine | Admitting: Internal Medicine

## 2022-10-15 DIAGNOSIS — R17 Unspecified jaundice: Secondary | ICD-10-CM | POA: Diagnosis present

## 2022-10-15 DIAGNOSIS — R197 Diarrhea, unspecified: Secondary | ICD-10-CM | POA: Diagnosis not present

## 2022-10-15 DIAGNOSIS — D631 Anemia in chronic kidney disease: Secondary | ICD-10-CM | POA: Diagnosis not present

## 2022-10-15 DIAGNOSIS — K659 Peritonitis, unspecified: Secondary | ICD-10-CM | POA: Diagnosis not present

## 2022-10-15 DIAGNOSIS — Z681 Body mass index (BMI) 19 or less, adult: Secondary | ICD-10-CM | POA: Diagnosis not present

## 2022-10-15 DIAGNOSIS — K219 Gastro-esophageal reflux disease without esophagitis: Secondary | ICD-10-CM | POA: Diagnosis present

## 2022-10-15 DIAGNOSIS — R6520 Severe sepsis without septic shock: Secondary | ICD-10-CM | POA: Diagnosis not present

## 2022-10-15 DIAGNOSIS — Z1152 Encounter for screening for COVID-19: Secondary | ICD-10-CM

## 2022-10-15 DIAGNOSIS — E872 Acidosis, unspecified: Secondary | ICD-10-CM | POA: Diagnosis present

## 2022-10-15 DIAGNOSIS — E162 Hypoglycemia, unspecified: Secondary | ICD-10-CM | POA: Diagnosis not present

## 2022-10-15 DIAGNOSIS — R880 Cloudy (hemodialysis) (peritoneal) dialysis effluent: Secondary | ICD-10-CM | POA: Diagnosis not present

## 2022-10-15 DIAGNOSIS — I120 Hypertensive chronic kidney disease with stage 5 chronic kidney disease or end stage renal disease: Secondary | ICD-10-CM | POA: Diagnosis present

## 2022-10-15 DIAGNOSIS — Z833 Family history of diabetes mellitus: Secondary | ICD-10-CM

## 2022-10-15 DIAGNOSIS — Z992 Dependence on renal dialysis: Secondary | ICD-10-CM

## 2022-10-15 DIAGNOSIS — N186 End stage renal disease: Secondary | ICD-10-CM | POA: Diagnosis not present

## 2022-10-15 DIAGNOSIS — E871 Hypo-osmolality and hyponatremia: Secondary | ICD-10-CM | POA: Diagnosis present

## 2022-10-15 DIAGNOSIS — Y812 Prosthetic and other implants, materials and accessory general- and plastic-surgery devices associated with adverse incidents: Secondary | ICD-10-CM | POA: Diagnosis present

## 2022-10-15 DIAGNOSIS — Z79899 Other long term (current) drug therapy: Secondary | ICD-10-CM

## 2022-10-15 DIAGNOSIS — Z87891 Personal history of nicotine dependence: Secondary | ICD-10-CM | POA: Diagnosis not present

## 2022-10-15 DIAGNOSIS — A419 Sepsis, unspecified organism: Secondary | ICD-10-CM | POA: Diagnosis not present

## 2022-10-15 DIAGNOSIS — A4159 Other Gram-negative sepsis: Secondary | ICD-10-CM | POA: Diagnosis present

## 2022-10-15 DIAGNOSIS — I700 Atherosclerosis of aorta: Secondary | ICD-10-CM | POA: Diagnosis not present

## 2022-10-15 DIAGNOSIS — D6489 Other specified anemias: Secondary | ICD-10-CM | POA: Diagnosis present

## 2022-10-15 DIAGNOSIS — E440 Moderate protein-calorie malnutrition: Secondary | ICD-10-CM | POA: Diagnosis present

## 2022-10-15 DIAGNOSIS — K5900 Constipation, unspecified: Secondary | ICD-10-CM | POA: Diagnosis not present

## 2022-10-15 DIAGNOSIS — T85611A Breakdown (mechanical) of intraperitoneal dialysis catheter, initial encounter: Secondary | ICD-10-CM | POA: Diagnosis not present

## 2022-10-15 DIAGNOSIS — R109 Unspecified abdominal pain: Secondary | ICD-10-CM | POA: Diagnosis not present

## 2022-10-15 DIAGNOSIS — N2581 Secondary hyperparathyroidism of renal origin: Secondary | ICD-10-CM | POA: Diagnosis present

## 2022-10-15 DIAGNOSIS — Z1611 Resistance to penicillins: Secondary | ICD-10-CM | POA: Diagnosis not present

## 2022-10-15 DIAGNOSIS — Z8041 Family history of malignant neoplasm of ovary: Secondary | ICD-10-CM

## 2022-10-15 DIAGNOSIS — T8571XA Infection and inflammatory reaction due to peritoneal dialysis catheter, initial encounter: Principal | ICD-10-CM | POA: Diagnosis present

## 2022-10-15 DIAGNOSIS — K650 Generalized (acute) peritonitis: Secondary | ICD-10-CM | POA: Diagnosis present

## 2022-10-15 DIAGNOSIS — Z8249 Family history of ischemic heart disease and other diseases of the circulatory system: Secondary | ICD-10-CM

## 2022-10-15 DIAGNOSIS — I10 Essential (primary) hypertension: Secondary | ICD-10-CM | POA: Diagnosis not present

## 2022-10-15 DIAGNOSIS — B961 Klebsiella pneumoniae [K. pneumoniae] as the cause of diseases classified elsewhere: Secondary | ICD-10-CM | POA: Diagnosis present

## 2022-10-15 DIAGNOSIS — N185 Chronic kidney disease, stage 5: Secondary | ICD-10-CM | POA: Diagnosis not present

## 2022-10-15 DIAGNOSIS — M329 Systemic lupus erythematosus, unspecified: Secondary | ICD-10-CM | POA: Diagnosis present

## 2022-10-15 DIAGNOSIS — K2100 Gastro-esophageal reflux disease with esophagitis, without bleeding: Secondary | ICD-10-CM | POA: Diagnosis not present

## 2022-10-16 ENCOUNTER — Inpatient Hospital Stay (HOSPITAL_COMMUNITY): Payer: Medicare Other

## 2022-10-16 ENCOUNTER — Other Ambulatory Visit: Payer: Self-pay

## 2022-10-16 ENCOUNTER — Encounter (HOSPITAL_COMMUNITY): Payer: Self-pay

## 2022-10-16 ENCOUNTER — Emergency Department (HOSPITAL_COMMUNITY): Payer: Medicare Other

## 2022-10-16 DIAGNOSIS — R188 Other ascites: Secondary | ICD-10-CM | POA: Diagnosis not present

## 2022-10-16 DIAGNOSIS — Z681 Body mass index (BMI) 19 or less, adult: Secondary | ICD-10-CM | POA: Diagnosis not present

## 2022-10-16 DIAGNOSIS — E871 Hypo-osmolality and hyponatremia: Secondary | ICD-10-CM | POA: Diagnosis present

## 2022-10-16 DIAGNOSIS — M6281 Muscle weakness (generalized): Secondary | ICD-10-CM | POA: Diagnosis not present

## 2022-10-16 DIAGNOSIS — G9349 Other encephalopathy: Secondary | ICD-10-CM | POA: Diagnosis not present

## 2022-10-16 DIAGNOSIS — K650 Generalized (acute) peritonitis: Secondary | ICD-10-CM | POA: Diagnosis not present

## 2022-10-16 DIAGNOSIS — K5900 Constipation, unspecified: Secondary | ICD-10-CM | POA: Diagnosis not present

## 2022-10-16 DIAGNOSIS — I120 Hypertensive chronic kidney disease with stage 5 chronic kidney disease or end stage renal disease: Secondary | ICD-10-CM | POA: Diagnosis not present

## 2022-10-16 DIAGNOSIS — K219 Gastro-esophageal reflux disease without esophagitis: Secondary | ICD-10-CM | POA: Diagnosis not present

## 2022-10-16 DIAGNOSIS — N289 Disorder of kidney and ureter, unspecified: Secondary | ICD-10-CM | POA: Diagnosis not present

## 2022-10-16 DIAGNOSIS — R6520 Severe sepsis without septic shock: Secondary | ICD-10-CM | POA: Diagnosis not present

## 2022-10-16 DIAGNOSIS — M329 Systemic lupus erythematosus, unspecified: Secondary | ICD-10-CM | POA: Diagnosis not present

## 2022-10-16 DIAGNOSIS — N2581 Secondary hyperparathyroidism of renal origin: Secondary | ICD-10-CM | POA: Diagnosis present

## 2022-10-16 DIAGNOSIS — N189 Chronic kidney disease, unspecified: Secondary | ICD-10-CM | POA: Insufficient documentation

## 2022-10-16 DIAGNOSIS — N250 Renal osteodystrophy: Secondary | ICD-10-CM | POA: Diagnosis not present

## 2022-10-16 DIAGNOSIS — R109 Unspecified abdominal pain: Secondary | ICD-10-CM | POA: Diagnosis not present

## 2022-10-16 DIAGNOSIS — G4700 Insomnia, unspecified: Secondary | ICD-10-CM | POA: Diagnosis not present

## 2022-10-16 DIAGNOSIS — I10 Essential (primary) hypertension: Secondary | ICD-10-CM | POA: Diagnosis not present

## 2022-10-16 DIAGNOSIS — E872 Acidosis, unspecified: Secondary | ICD-10-CM | POA: Insufficient documentation

## 2022-10-16 DIAGNOSIS — T8571XA Infection and inflammatory reaction due to peritoneal dialysis catheter, initial encounter: Secondary | ICD-10-CM | POA: Diagnosis not present

## 2022-10-16 DIAGNOSIS — N185 Chronic kidney disease, stage 5: Secondary | ICD-10-CM

## 2022-10-16 DIAGNOSIS — Z1152 Encounter for screening for COVID-19: Secondary | ICD-10-CM | POA: Diagnosis not present

## 2022-10-16 DIAGNOSIS — D631 Anemia in chronic kidney disease: Secondary | ICD-10-CM | POA: Insufficient documentation

## 2022-10-16 DIAGNOSIS — Z4901 Encounter for fitting and adjustment of extracorporeal dialysis catheter: Secondary | ICD-10-CM | POA: Diagnosis not present

## 2022-10-16 DIAGNOSIS — Z1611 Resistance to penicillins: Secondary | ICD-10-CM | POA: Diagnosis present

## 2022-10-16 DIAGNOSIS — D72829 Elevated white blood cell count, unspecified: Secondary | ICD-10-CM | POA: Diagnosis not present

## 2022-10-16 DIAGNOSIS — N186 End stage renal disease: Secondary | ICD-10-CM | POA: Diagnosis not present

## 2022-10-16 DIAGNOSIS — R17 Unspecified jaundice: Secondary | ICD-10-CM | POA: Diagnosis present

## 2022-10-16 DIAGNOSIS — Z992 Dependence on renal dialysis: Secondary | ICD-10-CM | POA: Diagnosis not present

## 2022-10-16 DIAGNOSIS — Z87891 Personal history of nicotine dependence: Secondary | ICD-10-CM | POA: Diagnosis not present

## 2022-10-16 DIAGNOSIS — T85691A Other mechanical complication of intraperitoneal dialysis catheter, initial encounter: Secondary | ICD-10-CM | POA: Diagnosis not present

## 2022-10-16 DIAGNOSIS — K529 Noninfective gastroenteritis and colitis, unspecified: Secondary | ICD-10-CM | POA: Diagnosis not present

## 2022-10-16 DIAGNOSIS — I700 Atherosclerosis of aorta: Secondary | ICD-10-CM | POA: Diagnosis not present

## 2022-10-16 DIAGNOSIS — B9689 Other specified bacterial agents as the cause of diseases classified elsewhere: Secondary | ICD-10-CM | POA: Diagnosis not present

## 2022-10-16 DIAGNOSIS — E440 Moderate protein-calorie malnutrition: Secondary | ICD-10-CM | POA: Diagnosis not present

## 2022-10-16 DIAGNOSIS — A419 Sepsis, unspecified organism: Secondary | ICD-10-CM | POA: Diagnosis not present

## 2022-10-16 DIAGNOSIS — D6489 Other specified anemias: Secondary | ICD-10-CM | POA: Diagnosis present

## 2022-10-16 DIAGNOSIS — A4159 Other Gram-negative sepsis: Secondary | ICD-10-CM | POA: Diagnosis present

## 2022-10-16 DIAGNOSIS — N281 Cyst of kidney, acquired: Secondary | ICD-10-CM | POA: Diagnosis not present

## 2022-10-16 DIAGNOSIS — K2100 Gastro-esophageal reflux disease with esophagitis, without bleeding: Secondary | ICD-10-CM | POA: Diagnosis not present

## 2022-10-16 DIAGNOSIS — R2681 Unsteadiness on feet: Secondary | ICD-10-CM | POA: Diagnosis not present

## 2022-10-16 DIAGNOSIS — K658 Other peritonitis: Secondary | ICD-10-CM | POA: Diagnosis not present

## 2022-10-16 DIAGNOSIS — E162 Hypoglycemia, unspecified: Secondary | ICD-10-CM | POA: Diagnosis not present

## 2022-10-16 DIAGNOSIS — Y812 Prosthetic and other implants, materials and accessory general- and plastic-surgery devices associated with adverse incidents: Secondary | ICD-10-CM | POA: Diagnosis present

## 2022-10-16 DIAGNOSIS — Z79899 Other long term (current) drug therapy: Secondary | ICD-10-CM | POA: Diagnosis not present

## 2022-10-16 DIAGNOSIS — K659 Peritonitis, unspecified: Secondary | ICD-10-CM | POA: Diagnosis not present

## 2022-10-16 DIAGNOSIS — T85611A Breakdown (mechanical) of intraperitoneal dialysis catheter, initial encounter: Secondary | ICD-10-CM | POA: Diagnosis present

## 2022-10-16 LAB — CBC WITH DIFFERENTIAL/PLATELET
Abs Immature Granulocytes: 0.01 10*3/uL (ref 0.00–0.07)
Basophils Absolute: 0 10*3/uL (ref 0.0–0.1)
Basophils Relative: 0 %
Eosinophils Absolute: 0.1 10*3/uL (ref 0.0–0.5)
Eosinophils Relative: 3 %
HCT: 32.3 % — ABNORMAL LOW (ref 36.0–46.0)
Hemoglobin: 10 g/dL — ABNORMAL LOW (ref 12.0–15.0)
Immature Granulocytes: 0 %
Lymphocytes Relative: 14 %
Lymphs Abs: 0.4 10*3/uL — ABNORMAL LOW (ref 0.7–4.0)
MCH: 27 pg (ref 26.0–34.0)
MCHC: 31 g/dL (ref 30.0–36.0)
MCV: 87.3 fL (ref 80.0–100.0)
Monocytes Absolute: 0.2 10*3/uL (ref 0.1–1.0)
Monocytes Relative: 8 %
Neutro Abs: 2 10*3/uL (ref 1.7–7.7)
Neutrophils Relative %: 75 %
Platelets: 381 10*3/uL (ref 150–400)
RBC: 3.7 MIL/uL — ABNORMAL LOW (ref 3.87–5.11)
RDW: 16.3 % — ABNORMAL HIGH (ref 11.5–15.5)
WBC: 2.7 10*3/uL — ABNORMAL LOW (ref 4.0–10.5)
nRBC: 3 % — ABNORMAL HIGH (ref 0.0–0.2)

## 2022-10-16 LAB — COMPREHENSIVE METABOLIC PANEL
ALT: 10 U/L (ref 0–44)
AST: 15 U/L (ref 15–41)
Albumin: 2 g/dL — ABNORMAL LOW (ref 3.5–5.0)
Alkaline Phosphatase: 48 U/L (ref 38–126)
Anion gap: 18 — ABNORMAL HIGH (ref 5–15)
BUN: 75 mg/dL — ABNORMAL HIGH (ref 8–23)
CO2: 19 mmol/L — ABNORMAL LOW (ref 22–32)
Calcium: 7.5 mg/dL — ABNORMAL LOW (ref 8.9–10.3)
Chloride: 96 mmol/L — ABNORMAL LOW (ref 98–111)
Creatinine, Ser: 13.01 mg/dL — ABNORMAL HIGH (ref 0.44–1.00)
GFR, Estimated: 3 mL/min — ABNORMAL LOW (ref >60)
Glucose, Bld: 122 mg/dL — ABNORMAL HIGH (ref 70–99)
Potassium: 3.6 mmol/L (ref 3.5–5.1)
Sodium: 133 mmol/L — ABNORMAL LOW (ref 135–145)
Total Bilirubin: 2.3 mg/dL — ABNORMAL HIGH (ref 0.3–1.2)
Total Protein: 6.8 g/dL (ref 6.5–8.1)

## 2022-10-16 LAB — LACTIC ACID, PLASMA
Lactic Acid, Venous: 1 mmol/L (ref 0.5–1.9)
Lactic Acid, Venous: 1.4 mmol/L (ref 0.5–1.9)

## 2022-10-16 LAB — MAGNESIUM: Magnesium: 1.7 mg/dL (ref 1.7–2.4)

## 2022-10-16 LAB — RESP PANEL BY RT-PCR (RSV, FLU A&B, COVID)  RVPGX2
Influenza A by PCR: NEGATIVE
Influenza B by PCR: NEGATIVE
Resp Syncytial Virus by PCR: NEGATIVE
SARS Coronavirus 2 by RT PCR: NEGATIVE

## 2022-10-16 LAB — PROTIME-INR
INR: 1.3 — ABNORMAL HIGH (ref 0.8–1.2)
Prothrombin Time: 15.8 seconds — ABNORMAL HIGH (ref 11.4–15.2)

## 2022-10-16 LAB — APTT: aPTT: 39 seconds — ABNORMAL HIGH (ref 24–36)

## 2022-10-16 LAB — LIPASE, BLOOD: Lipase: 40 U/L (ref 11–51)

## 2022-10-16 MED ORDER — PANTOPRAZOLE SODIUM 40 MG PO TBEC
40.0000 mg | DELAYED_RELEASE_TABLET | Freq: Every day | ORAL | Status: DC
  Administered 2022-10-16: 40 mg via ORAL
  Filled 2022-10-16: qty 1

## 2022-10-16 MED ORDER — IRBESARTAN 300 MG PO TABS
300.0000 mg | ORAL_TABLET | Freq: Every evening | ORAL | Status: DC
  Administered 2022-10-16 – 2022-11-05 (×17): 300 mg via ORAL
  Filled 2022-10-16 (×19): qty 1

## 2022-10-16 MED ORDER — PROPRANOLOL HCL 20 MG PO TABS
40.0000 mg | ORAL_TABLET | Freq: Four times a day (QID) | ORAL | Status: DC
  Administered 2022-10-16 – 2022-11-06 (×63): 40 mg via ORAL
  Filled 2022-10-16 (×14): qty 2
  Filled 2022-10-16: qty 1
  Filled 2022-10-16 (×14): qty 2
  Filled 2022-10-16: qty 1
  Filled 2022-10-16 (×5): qty 2
  Filled 2022-10-16: qty 1
  Filled 2022-10-16 (×7): qty 2
  Filled 2022-10-16: qty 1
  Filled 2022-10-16: qty 2
  Filled 2022-10-16: qty 4
  Filled 2022-10-16 (×3): qty 2
  Filled 2022-10-16: qty 1
  Filled 2022-10-16 (×7): qty 2
  Filled 2022-10-16: qty 1
  Filled 2022-10-16 (×14): qty 2

## 2022-10-16 MED ORDER — VANCOMYCIN HCL IN DEXTROSE 1-5 GM/200ML-% IV SOLN
1000.0000 mg | Freq: Once | INTRAVENOUS | Status: AC
  Administered 2022-10-16: 1000 mg via INTRAVENOUS
  Filled 2022-10-16: qty 200

## 2022-10-16 MED ORDER — SPIRONOLACTONE 25 MG PO TABS
25.0000 mg | ORAL_TABLET | Freq: Every day | ORAL | Status: DC
  Administered 2022-10-16 – 2022-11-06 (×18): 25 mg via ORAL
  Filled 2022-10-16 (×19): qty 1

## 2022-10-16 MED ORDER — ONDANSETRON HCL 4 MG/2ML IJ SOLN
4.0000 mg | Freq: Four times a day (QID) | INTRAMUSCULAR | Status: DC | PRN
  Administered 2022-10-16 – 2022-10-28 (×3): 4 mg via INTRAVENOUS
  Filled 2022-10-16 (×3): qty 2

## 2022-10-16 MED ORDER — AMLODIPINE BESYLATE 10 MG PO TABS
10.0000 mg | ORAL_TABLET | Freq: Every day | ORAL | Status: DC
  Administered 2022-10-16 – 2022-11-06 (×17): 10 mg via ORAL
  Filled 2022-10-16 (×11): qty 1
  Filled 2022-10-16: qty 2
  Filled 2022-10-16: qty 1
  Filled 2022-10-16: qty 2
  Filled 2022-10-16 (×5): qty 1

## 2022-10-16 MED ORDER — HYDROMORPHONE HCL 1 MG/ML IJ SOLN
0.5000 mg | Freq: Once | INTRAMUSCULAR | Status: AC
  Administered 2022-10-16: 0.5 mg via INTRAVENOUS
  Filled 2022-10-16: qty 1

## 2022-10-16 MED ORDER — SODIUM CHLORIDE 0.9 % IV BOLUS
500.0000 mL | Freq: Once | INTRAVENOUS | Status: AC
  Administered 2022-10-16: 500 mL via INTRAVENOUS

## 2022-10-16 MED ORDER — SODIUM CHLORIDE 0.9 % IV SOLN
2.0000 g | Freq: Once | INTRAVENOUS | Status: AC
  Administered 2022-10-16: 2 g via INTRAVENOUS
  Filled 2022-10-16: qty 12.5

## 2022-10-16 MED ORDER — SODIUM CHLORIDE 0.9% FLUSH
3.0000 mL | Freq: Two times a day (BID) | INTRAVENOUS | Status: DC
  Administered 2022-10-16 – 2022-11-06 (×36): 3 mL via INTRAVENOUS

## 2022-10-16 MED ORDER — TRAMADOL HCL 50 MG PO TABS
50.0000 mg | ORAL_TABLET | Freq: Four times a day (QID) | ORAL | Status: DC | PRN
  Administered 2022-10-16 – 2022-10-20 (×4): 50 mg via ORAL
  Filled 2022-10-16 (×5): qty 1

## 2022-10-16 MED ORDER — ENOXAPARIN SODIUM 40 MG/0.4ML IJ SOSY: 40.0000 mg | PREFILLED_SYRINGE | INTRAMUSCULAR | Status: DC

## 2022-10-16 MED ORDER — HEPARIN SODIUM (PORCINE) 5000 UNIT/ML IJ SOLN
5000.0000 [IU] | Freq: Three times a day (TID) | INTRAMUSCULAR | Status: DC
  Administered 2022-10-16 – 2022-10-19 (×8): 5000 [IU] via SUBCUTANEOUS
  Filled 2022-10-16 (×8): qty 1

## 2022-10-16 MED ORDER — SODIUM CHLORIDE 0.9 % IV SOLN
1.0000 g | INTRAVENOUS | Status: DC
  Administered 2022-10-17 – 2022-10-20 (×4): 1 g via INTRAVENOUS
  Filled 2022-10-16 (×5): qty 10

## 2022-10-16 MED ORDER — VANCOMYCIN VARIABLE DOSE PER UNSTABLE RENAL FUNCTION (PHARMACIST DOSING): Status: DC

## 2022-10-16 MED ORDER — ACETAMINOPHEN 650 MG RE SUPP: 650.0000 mg | Freq: Four times a day (QID) | RECTAL | Status: DC | PRN

## 2022-10-16 MED ORDER — HYDROMORPHONE HCL 1 MG/ML IJ SOLN: 0.5000 mg | INTRAMUSCULAR | Status: DC | PRN

## 2022-10-16 MED ORDER — ALBUTEROL SULFATE (2.5 MG/3ML) 0.083% IN NEBU: 2.5000 mg | INHALATION_SOLUTION | Freq: Four times a day (QID) | RESPIRATORY_TRACT | Status: DC | PRN

## 2022-10-16 MED ORDER — DELFLEX-LC/1.5% DEXTROSE 344 MOSM/L IP SOLN: INTRAPERITONEAL | Status: DC

## 2022-10-16 MED ORDER — ACETAMINOPHEN 325 MG PO TABS
650.0000 mg | ORAL_TABLET | Freq: Four times a day (QID) | ORAL | Status: DC | PRN
  Administered 2022-10-17 – 2022-11-06 (×15): 650 mg via ORAL
  Filled 2022-10-16 (×15): qty 2

## 2022-10-16 MED ORDER — FUROSEMIDE 40 MG PO TABS
80.0000 mg | ORAL_TABLET | Freq: Two times a day (BID) | ORAL | Status: DC
  Administered 2022-10-16 – 2022-10-27 (×16): 80 mg via ORAL
  Filled 2022-10-16 (×6): qty 2
  Filled 2022-10-16: qty 4
  Filled 2022-10-16 (×2): qty 2
  Filled 2022-10-16: qty 4
  Filled 2022-10-16 (×8): qty 2

## 2022-10-16 MED ORDER — HYDROXYCHLOROQUINE SULFATE 200 MG PO TABS
200.0000 mg | ORAL_TABLET | ORAL | Status: DC
  Administered 2022-10-17 – 2022-11-06 (×8): 200 mg via ORAL
  Filled 2022-10-16 (×11): qty 1

## 2022-10-16 MED ORDER — GENTAMICIN SULFATE 0.1 % EX CREA
1.0000 | TOPICAL_CREAM | Freq: Every day | CUTANEOUS | Status: DC
  Administered 2022-10-17 – 2022-11-05 (×6): 1 via TOPICAL
  Filled 2022-10-16 (×2): qty 15

## 2022-10-16 NOTE — Consult Note (Addendum)
[facility] Renal Consultation Note    Indication for Consultation:  Management of ESRD/hemodialysis, anemia, hypertension/volume, and secondary hyperparathyroidism.  HPI: Sheena Smith is a 71 y.o. female with PMH including ESRD on PD for 2 years, HTN, and lupus. She reports a piece of her PD catheter came lose when she was at the dialysis unit for a dressing change about 1 week ago. Several days later, she began developing abdominal pain, nausea, decreased appetite, and low grade fever. She did miss two dialysis sessions this week. She had PD cultures drawn at her outpatient unit and they returned yesterday with gram negative rods. She had empirically received 1 dose of IP vancomycin and plan was to go to her outpatient unit today to start ceftaz, but her pain was too severe when attempting dialysis last night so she came to the ED instead. Reports her catheter has been draining well, though it is painful. She has also had diarrhea for several days. Makes a small amount of urine and denies dysuria, hematuria. She had very little to eat or drink over the past several days due to pain. Labs notable for Na 133, K+ 3.6, BUN 75, Cr 13.0.1, WBC 2.7, Hgb 10, Plt 381.   She typically does 5 exchanges overnight, all 1.5% dextrose unless she has excess volume, fill volume 1.8L. Rx and above antibiotic/culture history were confirmed with her outpatient PD nurse.    Past Medical History:  Diagnosis Date   Hypertension    Lupus ([facility])    Renal failure    Past Surgical History:  Procedure Laterality Date   FOOT SURGERY     SMALL INTESTINE SURGERY     Family History  Problem Relation Age of Onset   Ovarian cancer Mother    Hypertension Father    Diabetes Father    Social History:  reports that she has quit smoking. She has never used smokeless tobacco. She reports that she does not currently use alcohol. She reports that she does not currently use drugs.  ROS: As per HPI otherwise  negative.  Physical Exam: Vitals:   10/16/22 1115 10/16/22 1130 10/16/22 1145 10/16/22 1215  BP: 133/74 132/88 132/72 (!) 123/95  Pulse: 78 80 79 80  Resp: (!) 23 (!) 27 (!) 21 (!) 23  Temp:      TempSrc:      SpO2: 96% 99% 99% 98%  Weight:      Height:         General: Well developed, well nourished, in no acute distress. Head: Normocephalic, atraumatic, sclera non-icteric, mucus membranes are moist. Neck: JVD not elevated. Lungs: Clear bilaterally to auscultation without wheezes, rales, or rhonchi. Breathing is unlabored. Heart: RRR with normal S1, S2. No murmurs, rubs, or gallops appreciated. Abdomen: Soft, diffusely TTP, +BS. PD cath in right lower abdomen with intact dressing, no surrounding erythema or drainage Musculoskeletal:  Strength and tone appear normal for age. Lower extremities: No edema b/l lower extremities Neuro: Alert and oriented X 3. Moves all extremities spontaneously. Psych:  Responds to questions appropriately with a normal affect.   Allergies  Allergen Reactions   Nsaids     "Kidney Disease. Exception to this NSAID intolerance is aspirin 81-'325mg'$  daily"'   Other     Per pt, states that she was informed she was allergic to sulfa after allergy testing  "Kidney Disease. Exception to this NSAID intolerance is aspirin 81-'325mg'$  daily"'   Atenolol     palpitation   Elemental Sulfur  Prior to Admission medications   Medication Sig Start Date End Date Taking? Authorizing Provider  amLODipine (NORVASC) 10 MG tablet Take 1 tablet by mouth daily. 11/07/19 10/16/22 Yes [provider]  ferrous sulfate 324 MG TBEC Take 324 mg by mouth daily with breakfast.   Yes [provider]  furosemide (LASIX) 80 MG tablet Take 80 mg by mouth 2 (two) times daily.   Yes [provider]  hydroxychloroquine (PLAQUENIL) 200 MG tablet Take 200 mg by mouth every other day.   Yes [provider]  irbesartan (AVAPRO) 300 MG tablet Take 300 mg by  mouth every evening.   Yes [provider]  pantoprazole (PROTONIX) 40 MG tablet Take 40 mg by mouth daily.   Yes [provider]  propranolol (INDERAL) 20 MG tablet Take 40 mg by mouth 4 (four) times daily. 02/14/19 10/16/22 Yes [provider]  sodium bicarbonate 650 MG tablet Take 650 mg by mouth once as needed for heartburn. 09/26/19 09/26/23 Yes [provider]  spironolactone (ALDACTONE) 25 MG tablet Take 25 mg by mouth daily.   Yes [provider]   Current Facility-Administered Medications  Medication Dose Route Frequency Provider Last Rate Last Admin   acetaminophen (TYLENOL) tablet 650 mg  650 mg Oral Q6H PRN Howerter, Justin B, DO       Or   acetaminophen (TYLENOL) suppository 650 mg  650 mg Rectal Q6H PRN Howerter, Justin B, DO       albuterol (PROVENTIL) (2.5 MG/3ML) 0.083% nebulizer solution 2.5 mg  2.5 mg Nebulization Q6H PRN Tamala Julian, Rondell A, MD       amLODipine (NORVASC) tablet 10 mg  10 mg Oral Daily Norval Morton, MD       [START ON 10/17/2022] ceFEPIme (MAXIPIME) 1 g in sodium chloride 0.9 % 100 mL IVPB  1 g Intravenous Q24H Bertis Ruddy, RPH       furosemide (LASIX) tablet 80 mg  80 mg Oral BID Tamala Julian, Rondell A, MD       heparin injection 5,000 Units  5,000 Units Subcutaneous Q8H Smith, Rondell A, MD       HYDROmorphone (DILAUDID) injection 0.5 mg  0.5 mg Intravenous Q2H PRN Howerter, Justin B, DO       [START ON 10/17/2022] hydroxychloroquine (PLAQUENIL) tablet 200 mg  200 mg Oral BID Smith, Rondell A, MD       irbesartan (AVAPRO) tablet 300 mg  300 mg Oral QPM Smith, Rondell A, MD       ondansetron (ZOFRAN) injection 4 mg  4 mg Intravenous Q6H PRN Howerter, Justin B, DO       pantoprazole (PROTONIX) EC tablet 40 mg  40 mg Oral Daily Smith, Rondell A, MD       propranolol (INDERAL) tablet 40 mg  40 mg Oral QID Smith, Rondell A, MD       sodium chloride flush (NS) 0.9 % injection 3 mL  3 mL Intravenous Q12H Smith, Rondell A,  MD   3 mL at 10/16/22 1242   spironolactone (ALDACTONE) tablet 25 mg  25 mg Oral Daily Smith, Rondell A, MD       vancomycin variable dose per unstable renal function (pharmacist dosing)   Does not apply See admin instructions Bertis Ruddy, [facility]       Current Outpatient Medications  Medication Sig Dispense Refill   amLODipine (NORVASC) 10 MG tablet Take 1 tablet by mouth daily.     ferrous sulfate 324 MG TBEC Take 324  mg by mouth daily with breakfast.     furosemide (LASIX) 80 MG tablet Take 80 mg by mouth 2 (two) times daily.     hydroxychloroquine (PLAQUENIL) 200 MG tablet Take 200 mg by mouth every other day.     irbesartan (AVAPRO) 300 MG tablet Take 300 mg by mouth every evening.     pantoprazole (PROTONIX) 40 MG tablet Take 40 mg by mouth daily.     propranolol (INDERAL) 20 MG tablet Take 40 mg by mouth 4 (four) times daily.     sodium bicarbonate 650 MG tablet Take 650 mg by mouth once as needed for heartburn.     spironolactone (ALDACTONE) 25 MG tablet Take 25 mg by mouth daily.     Labs: Basic Metabolic Panel: Recent Labs  Lab 10/16/22 0031  NA 133*  K 3.6  CL 96*  CO2 19*  GLUCOSE 122*  BUN 75*  CREATININE 13.01*  CALCIUM 7.5*   Liver Function Tests: Recent Labs  Lab 10/16/22 0031  AST 15  ALT 10  ALKPHOS 48  BILITOT 2.3*  PROT 6.8  ALBUMIN 2.0*   Recent Labs  Lab 10/16/22 0031  LIPASE 40   No results for input(s): "AMMONIA" in the last 168 hours. CBC: Recent Labs  Lab 10/16/22 0031  WBC 2.7*  NEUTROABS 2.0  HGB 10.0*  HCT 32.3*  MCV 87.3  PLT 381    Studies/Results: CT ABDOMEN PELVIS WO CONTRAST  Result Date: 10/16/2022 CLINICAL DATA:  Acute abdominal pain EXAM: CT ABDOMEN AND PELVIS WITHOUT CONTRAST TECHNIQUE: Multidetector CT imaging of the abdomen and pelvis was performed following the standard protocol without IV contrast. RADIATION DOSE REDUCTION: This exam was performed according to the departmental dose-optimization program which  includes automated exposure control, adjustment of the mA and/or kV according to patient size and/or use of iterative reconstruction technique. COMPARISON:  None Available. FINDINGS: Lower Chest: Small pleural effusions Hepatobiliary: Normal hepatic contours. No intra- or extrahepatic biliary dilatation. Small volume perihepatic ascites. Normal gallbladder. Pancreas: Normal pancreas. No ductal dilatation or peripancreatic fluid collection. Spleen: Normal. Adrenals/Urinary Tract: The adrenal glands are normal. Bilateral renal atrophy. There is a left paramedian approach peritoneal dialysis catheter that is coiled in the right lower quadrant. Intermediate volume free fluid in the pelvis. There are a few expected punctate foci of intra-abdominal free air. Stomach/Bowel: Hiatal hernia with distal esophageal thickening. No small bowel dilatation or inflammation. No focal colonic abnormality. Vascular/Lymphatic: There is calcific atherosclerosis of the abdominal aorta. No lymphadenopathy. Reproductive: Fibroid uterus Other: None. Musculoskeletal: No bony spinal canal stenosis or focal osseous abnormality. IMPRESSION: 1. No acute abnormality of the abdomen or pelvis. 2. Peritoneal dialysis catheter coiled in the right lower quadrant with intermediate volume free fluid in the pelvis. 3. Small pleural effusions. 4. Hiatal hernia with distal esophageal thickening, which may indicate esophagitis. Correlation with non-emergent upper endoscopy recommended to exclude neoplasm. Aortic atherosclerosis (ICD10-I70.0). Electronically Signed   By: Ulyses Jarred M.D.   On: 10/16/2022 01:15    Dialysis Orders:  Center: [facility] in [place], [place] followed by Dr. Candiss Norse PD 7 days per week, 5 exchanges, 1848m fill volume, all 1.5% dextrose Mircera 2066m given 10/14/22 2g vanc Culture grew gram neg rods-plan was for ceftaz  Assessment/Plan:  Peritonitis: Likely due to equipment malfunction (reports a piece of her catheter came loose a  week ago). Outpatient cultures grew gram negative rods. Received empiric vancomycin and cefepime here today. Will repeat cultures in the AM to further direct antibiotic susceptibility.  ESRD: On PD, will resume CCPD tonight as above.   Hypertension/volume: No volume overload on exam. Continue home BP meds, Will use all 1.5% dextrose exchanges tonight.   Anemia: Hgb 10, recently received 260mg of mircera on 135/00/93 Metabolic bone disease: Corrected calcium ok, will check phos. Does not appear to be on VDRA or binders  Nutrition:  Albumin low with poor appetite, recommend starting protein supplements once she tolerates PO  SAnice Paganini PA-C 10/16/2022, 1:51 PM  [facility] Pager: [phone]  Seen and examined independently.  Agree with note and exam as documented above by physician extender and as noted here.  Ms. ETomlinsonis a pleasant female with a history of ESRD on peritoneal dialysis, hypertension, and lupus who presented to the hospital with considerable abdominal pain.  Her daughter is at bedside and supplements her history.  She had a contamination when the PD catheter was being cleaned and this was then exchanged.  She first had pain past weekend on Saturday, 12/9, which was worse than she has ever had.  She also had nausea with emesis as well as chills.  She reached out to her home training unit and she has been there several times over the past few days.  She did cultures twice - they stated one set was contaminated and recollected the next day.  She states that she was able to do PD overnight on Tuesday but could not tolerate it on Wednesday overnight due to the pain when draining - "felt like my insides were coming out".  She normally uses a 1.5% dextrose.  She had nausea again this is improved now.  She had a low-grade fever.  She has gotten vanc and cefepime here today.  She got vanc outpatient per her home training unit.  Her cultures have demonstrated gram  negative rods.  She is finally feeling a little better and is hungry now which is a change per her daughter.   General adult female in bed in no acute distress HEENT normocephalic atraumatic extraocular movements intact sclera anicteric Neck supple trachea midline Lungs clear to auscultation bilaterally normal work of breathing at rest  Heart S1S2 no rub Abdomen soft nontender nondistended Extremities no edema  Psych normal mood and affect Neuro - alert and oriented x 3 provides hx and follows commands Access - PD catheter in place   # Peritonitis  - attempt PD tonight - she did tolerate Tuesday night just not Wednesday.  - she is has gotten cefepime and vancomycin here with pharmacy dosing both  - follow up sensitivities with her outpatient PD unit - DShanon Payor  Her initial cultures demonstrated gram negative rods  - update PD fluid cell count and cultures in the AM - see order is in place - pain is likely due to peritonitis.  will check upright KUB to confirm PD catheter location as well   # ESRD - Continue PD tonight.  All 1.5% per her prior regimen   # HTN  - controlled on current regimen   # Anemia of CKD - Recently rec'd ESA outpatient   # Metabolic bone diseae  - check phos; not on activated vit D currently   LClaudia Desanctis MD 10/16/2022  5:42 PM

## 2022-10-16 NOTE — Progress Notes (Signed)
Pharmacy Antibiotic Note  Sheena Smith is a 71 y.o. female admitted on 10/15/2022 presenting with acute peritonitis.  Pharmacy has been consulted for vancomycin and cefepime dosing. Vancomycin loading dose and cefepime 2g IV given prior to transfer this AM, hx ESRD on PD  Plan: Cefepime 1g IV q 24h Vancomycin variable dosing based on levels Monitor PD schedule, Cx and clinical progression to narrow Vancomycin random level 12/15  Height: '5\' 2"'$  (157.5 cm) Weight: 51.7 kg (114 lb) IBW/kg (Calculated) : 50.1  Temp (24hrs), Avg:100 F (37.8 C), Min:99.2 F (37.3 C), Max:101 F (38.3 C)  Recent Labs  Lab 10/16/22 0031 10/16/22 0438  WBC 2.7*  --   CREATININE 13.01*  --   LATICACIDVEN 1.0 1.4    Estimated Creatinine Clearance: 3.2 mL/min (A) (by C-G formula based on SCr of 13.01 mg/dL (H)).    Allergies  Allergen Reactions   Other     Per pt, states that she was informed she was allergic to sulfa after allergy testing  "Kidney Disease. Exception to this NSAID intolerance is aspirin 81-'325mg'$  daily"'   Atenolol     palpitation   Elemental Sulfur     Bertis Ruddy, PharmD, [facility] Clinical Pharmacist ED Pharmacist Phone # [phone] 10/16/2022 9:50 AM

## 2022-10-16 NOTE — ED Triage Notes (Signed)
Pt does peritoneal dialysis. Started with bad cramping a few days ago. They tested her fluid from bag and stated it was peritonitis. She was started on antibiotics and has taken 1 full day in her bag. Stayed in stomach for 6 hours. Started to do dialysis today but could not tolerate it to the pain. Going tomorrow for next round of antibiotics.

## 2022-10-16 NOTE — Progress Notes (Signed)
  Carryover admission to the Day Admitter.  I discussed this case with the EDP, Dr. Sedonia Small.  Per these discussions:   This is a 71 year old female with end-stage renal disease who has been undergoing peritoneal dialysis over the last 2 years, who is being admitted with acute peritonitis after presenting with approximately 1 week of abdominal discomfort, worse on the right portion of the abdomen, associated with subjective fever.  Approximately 1 week ago, while undergoing a dressing change relating to her peritoneal catheter, a piece of the catheter reportedly broke off, potentially moving into the peritoneal space.  She subsequently developed progressive abdominal discomfort followed by development of subjective fever, and was diagnosed with acute peritonitis via peritoneal fluid analysis performed at her dialysis center.  She received a one-time dose of intraperitoneal antibiotic, but is continued to experience the above symptoms prompting her to present to the emergency department this evening for further evaluation management thereof.  Presenting vital signs notable for temperature max 101, heart rates in the 17E, systolic blood pressures in the 130s to 150s, oxygen saturation 96 to 99% on room air.  Labs notable for presenting potassium 3.6.  EDP discussed patient's case with the on-call nephrologist, Dr. Joylene Grapes, Who, in the setting of the patient's persistent fevers and worsening abdominal discomfort, recommended admission to [facility] for further evaluation/management of acute peritonitis.  Dr. Joylene Grapes conveys that he will formally consult, with additional recommendations pending at this time.  In the ED this evening, she has received a single dose of cefepime as well as a single dose of IV vancomycin.  Blood cultures x 2 collected prior to initiation of these IV antibiotics.  Additionally, she has received Dilaudid 0.5 mg IV x 1.   I have placed an order for inpatient admission to PCU for further  evaluation and management of the above.  I have placed some additional preliminary admit orders via the adult multi-morbid admission order set. I have also ordered as needed IV Dilaudid.  I have ordered additional morning labs, including CMP, magnesium and phosphorus levels.  I will defer decision making regarding additional IV antibiotics to the admitting physician.    Babs Bertin, DO Hospitalist

## 2022-10-16 NOTE — H&P (Signed)
History and Physical    Patient: Sheena Smith ZOX:[id number] DOB: [birth year] DOA: 10/15/2022 DOS: the patient was seen and examined on 10/16/2022 PCP: [name], MD  Patient coming from: Home  Chief Complaint:  Chief Complaint  Patient presents with   Abdominal Pain   HPI: Sheena Smith is a 71 y.o. female with medical history significant of HTN, ESRD on PD, and Lupus presents with complaints of abdominal pain starting 5 days ago.  She describes the pain as crampy in nature and worse across the lower right-hand side of her abdomen.  Reports associated symptoms of subjective fevers, chills, nausea, dry heaves, decreased appetite, and diarrhea.  She was seen 2 days ago by the dialysis nurse and they reported that the mental piece to the end of the catheter was loose and had broken off.  Patient noted that they think that that is possibly the source of how she became infected.  The nurse was able to replace it quickly with a new piece.  She reports that she was diagnosed with peritonitis after that noted fibrous tissue in her fluid.  Cultures grew out gram-negative rods.  She had been given intra-abdominal antibiotics of vancomycin x 1 dose with plans to start her on ceftaz in the outpatient setting.  She reported worsening abdominal pain, subjective fevers, and shortness of breath for which she came to the hospital for further evaluation.  Patient had missed her PD session the other night.  In the emergency department vital signs notable for temperature max 101F, heart rates in the 81X, systolic blood pressures in the 130-150s, oxygen saturation 96-99% on room air. Labs significant for WBC 2.7, hemoglobin 10, sodium 133, CO2 19, BUN 75,  Cr 13.01, Ca 7.5, Anion gap 18, and lactic 1->1.4. Blood cultures have been obtained.  Patient has been given Dilaudid but she reported that it was too strong and started on empiric antibiotics of vancomycin and cefepime.    Review of Systems: As  mentioned in the history of present illness. All other systems reviewed and are negative. Past Medical History:  Diagnosis Date   Hypertension    Lupus ([place])    Renal failure    Past Surgical History:  Procedure Laterality Date   FOOT SURGERY     SMALL INTESTINE SURGERY     Social History:  reports that she has quit smoking. She has never used smokeless tobacco. She reports that she does not currently use alcohol. She reports that she does not currently use drugs.  Allergies  Allergen Reactions   Other     Per pt, states that she was informed she was allergic to sulfa after allergy testing  "Kidney Disease. Exception to this NSAID intolerance is aspirin 81-'325mg'$  daily"'   Atenolol     palpitation   Elemental Sulfur     Family History  Problem Relation Age of Onset   Ovarian cancer Mother    Hypertension Father    Diabetes Father     Prior to Admission medications   Medication Sig Start Date End Date Taking? Authorizing Provider  amLODipine (NORVASC) 10 MG tablet Take 1 tablet by mouth daily. 11/07/19 11/06/21  [provider]  hydrALAZINE (APRESOLINE) 25 MG tablet Take 25 mg by mouth 3 (three) times daily.    [provider]  propranolol (INDERAL) 20 MG tablet Take 1 and one-half tablets by mouth 3 times a day and 2 tablets at bedtime 02/14/19 02/14/21  [provider]  sodium bicarbonate 650  MG tablet Take by mouth. 09/26/19 09/26/23  [provider]    Physical Exam: Vitals:   10/16/22 0700 10/16/22 0715 10/16/22 0730 10/16/22 0815  BP: 115/65 114/64 137/76 112/65  Pulse: 87 86 86 87  Resp: 20 (!) '21 18 20  '$ Temp:      TempSrc:      SpO2: 96% 97% 98% 97%  Weight:      Height:        Constitutional: Elderly female who appears to be in no acute distress at this time Eyes: PERRL, lids and conjunctivae normal ENMT: Mucous membranes are moist.   Neck: normal, supple, mild JVD Respiratory: clear to auscultation bilaterally, no wheezing,  no crackles. Normal respiratory effort. No accessory muscle use.  Cardiovascular: Regular rate and rhythm, no murmurs / rubs / gallops. No extremity edema. 2+ pedal pulses. No carotid bruits.  Abdomen: Tenderness palpation of the abdomen with guarding present.  Bowel sounds appreciated in all 4 quadrants.  Peritoneal dialysis catheter left side of the abdomen. Musculoskeletal: no clubbing / cyanosis. No joint deformity upper and lower extremities. Skin: no rashes, lesions, ulcers. No induration Neurologic: CN 2-12 grossly intact.  Able to move all extremities. Psychiatric: Normal judgment and insight. Alert and oriented x 3. Normal mood.   Data Reviewed:  EKG revealed normal sinus rhythm at 97 bpm  Assessment and Plan:  Sepsis secondary to peritonitis Acute.  Patient presents with complaints of abdominal pain with reports of nausea, triazenes, and diarrhea.  Noted to be febrile up to 101 F with tachypnea and WBC 2.7 meeting SIRS criteria.  Peritoneal fluid in the outpatient cultures grew gram-negative rods.  Patient has been given intraperitoneal antibiotics of vancomycin x 1 dose prior to cultures.  Lactic acid levels were reassuring.  Endorgan damage noted with elevated bilirubin greater than 2.   -Admit to a progressive bed -Follow-up blood cultures -Continue empiric antibiotics of vancomycin and cefepime.  De-escalate when medically appropriate. -Tylenol as needed for fever  ESRD on PD Patient reports malfunction of peritoneal dialysis catheter which was repaired by dialysis nurse.  Labs noted potassium 3.6, BUN 70, creatinine 13.01.   -PD per nephrology  Metabolic acidosis with elevated anion gap CO2 19 with anion gap of 18. -Continue to monitor  Anemia of chronic kidney disease Hemoglobin 10.  No reports of bleeding. -Continue to monitor  Essential hypertension Blood pressures currently maintained. -Continue amlodipine, furosemide, irbesartan, propranolol, and spironolactone  as tolerated  Lupus -Continue hydroxyquine   GERD  -Continue Protonix  DVT prophylaxis: Heparin Advance Care Planning:   Code Status: Full Code   Consults: Nephrology  Family Communication: Daughter updated at bedside  Severity of Illness: The appropriate patient status for this patient is INPATIENT. Inpatient status is judged to be reasonable and necessary in order to provide the required intensity of service to ensure the patient's safety. The patient's presenting symptoms, physical exam findings, and initial radiographic and laboratory data in the context of their chronic comorbidities is felt to place them at high risk for further clinical deterioration. Furthermore, it is not anticipated that the patient will be medically stable for discharge from the hospital within 2 midnights of admission.   * I certify that at the point of admission it is my clinical judgment that the patient will require inpatient hospital care spanning beyond 2 midnights from the point of admission due to high intensity of service, high risk for further deterioration and high frequency of surveillance required.*  Author: Delbert Phenix  Tamala Julian, MD 10/16/2022 8:58 AM  For on call review [email or web address].

## 2022-10-16 NOTE — ED Provider Triage Note (Signed)
Emergency Medicine Provider Triage Evaluation Note  Sheena Smith , a 71 y.o. female  was evaluated in triage.  Pt complains of abdominal pain, states that she saw her nephrologist to diagnose her with peritonitis, she was started on antibiotics was taking ciprofloxacin, states she had 1 full Day, but she has worsening pain, she does peritoneal dialysis, she was unable to do her full treatment today she has not missed any other treatments, no chest pain no shortness of breath no peripheral edema.  She is not having any other complaint other than abdominal pain.  She has had nausea vomiting still passing gas having bowel movements..  Review of Systems  Positive: Abdominal pain, abdominal discharge Negative: Chest pain, shortness of breath  Physical Exam  BP (!) 149/94 (BP Location: Right Arm)   Pulse 98   Temp 99.3 F (37.4 C) (Oral)   Resp 18   SpO2 99%  Gen:   Awake, no distress   Resp:  Normal effort  MSK:   Moves extremities without difficulty  Other:    Medical Decision Making  Medically screening exam initiated at 12:18 AM.  Appropriate orders placed.  Sheena Smith was informed that the remainder of the evaluation will be completed by another provider, this initial triage assessment does not replace that evaluation, and the importance of remaining in the ED until their evaluation is complete.  Lab work imaging have been ordered will need further workup.   Marcello Fennel, PA-C 10/16/22 0021

## 2022-10-16 NOTE — Progress Notes (Signed)
Pharmacy Antibiotic Note  Sheena Smith is a 71 y.o. female with ESRD on PD admitted on 10/15/2022 with abdominal pain/peritonitis.  Pharmacy has been consulted for Vancomycin  dosing.  Plan: Vancomycin 1000 mg IV now F/U plan for dialysis and check level in 3-4 days.    Height: '5\' 2"'$  (157.5 cm) Weight: 51.7 kg (114 lb) IBW/kg (Calculated) : 50.1  Temp (24hrs), Avg:100.2 F (37.9 C), Min:99.3 F (37.4 C), Max:101 F (38.3 C)  Recent Labs  Lab 10/16/22 0031 10/16/22 0438  WBC 2.7*  --   CREATININE 13.01*  --   LATICACIDVEN 1.0 1.4    Estimated Creatinine Clearance: 3.2 mL/min (A) (by C-G formula based on SCr of 13.01 mg/dL (H)).    Allergies  Allergen Reactions   Other     Per pt, states that she was informed she was allergic to sulfa after allergy testing  "Kidney Disease. Exception to this NSAID intolerance is aspirin 81-'325mg'$  daily"'   Atenolol     palpitation   Elemental Sulfur     Caryl Pina 10/16/2022 5:43 AM

## 2022-10-16 NOTE — ED Provider Notes (Signed)
[facility] Emergency Department Provider Note MRN:  [id number]  Arrival date & time: 10/16/22     Chief Complaint   Abdominal Pain   History of Present Illness   Sheena Smith is a 71 y.o. year-old female with a history of hypertension, lupus, ESRD presenting to the ED with chief complaint of abdominal pain.  Patient has been on peritoneal dialysis for the past 2 years.  About a week ago a nurse was managing her dressing her catheter and a piece of the catheter fell off.  Since then has developed abdominal pain, mostly right-sided.  Intermittent fevers.  Was diagnosed with peritonitis by her kidney doctors.  She received a dwell of intra-abdominal antibiotics few days ago.  She is still having pain and fevers despite this.  Review of Systems  A thorough review of systems was obtained and all systems are negative except as noted in the HPI and PMH.   Patient's Health History    Past Medical History:  Diagnosis Date   Hypertension    Lupus ([place])    Renal failure     Past Surgical History:  Procedure Laterality Date   FOOT SURGERY     SMALL INTESTINE SURGERY      Family History  Problem Relation Age of Onset   Ovarian cancer Mother    Hypertension Father    Diabetes Father     Social History   Socioeconomic History   Marital status: Widowed    Spouse name: Not on file   Number of children: Not on file   Years of education: Not on file   Highest education level: Not on file  Occupational History   Not on file  Tobacco Use   Smoking status: Former    Years: 2.00    Types: Cigarettes   Smokeless tobacco: Never  Substance and Sexual Activity   Alcohol use: Not Currently   Drug use: Not Currently   Sexual activity: Not on file  Other Topics Concern   Not on file  Social History Narrative   Not on file   Social Determinants of Health   Financial Resource Strain: Not on file  Food Insecurity: Not on file  Transportation Needs: Not on  file  Physical Activity: Not on file  Stress: Not on file  Social Connections: Not on file  Intimate Partner Violence: Not on file     Physical Exam   Vitals:   10/16/22 0340 10/16/22 0400  BP:  137/77  Pulse:  95  Resp:  (!) 29  Temp: 100.3 F (37.9 C)   SpO2:  96%    CONSTITUTIONAL: Well-appearing, NAD NEURO/PSYCH:  Alert and oriented x 3, no focal deficits EYES:  eyes equal and reactive ENT/NECK:  no LAD, no JVD CARDIO: Regular rate, well-perfused, normal S1 and S2 PULM:  CTAB no wheezing or rhonchi GI/GU:  non-distended, moderate right-sided abdominal tenderness MSK/SPINE:  No gross deformities, no edema SKIN:  no rash, atraumatic   *Additional and/or pertinent findings included in MDM below  Diagnostic and Interventional Summary    EKG Interpretation  Date/Time:    Ventricular Rate:    PR Interval:    QRS Duration:   QT Interval:    QTC Calculation:   R Axis:     Text Interpretation:         Labs Reviewed  COMPREHENSIVE METABOLIC PANEL - Abnormal; Notable for the following components:      Result Value   Sodium 133 (*)  Chloride 96 (*)    CO2 19 (*)    Glucose, Bld 122 (*)    BUN 75 (*)    Creatinine, Ser 13.01 (*)    Calcium 7.5 (*)    Albumin 2.0 (*)    Total Bilirubin 2.3 (*)    GFR, Estimated 3 (*)    Anion gap 18 (*)    All other components within normal limits  CBC WITH DIFFERENTIAL/PLATELET - Abnormal; Notable for the following components:   WBC 2.7 (*)    RBC 3.70 (*)    Hemoglobin 10.0 (*)    HCT 32.3 (*)    RDW 16.3 (*)    nRBC 3.0 (*)    Lymphs Abs 0.4 (*)    All other components within normal limits  RESP PANEL BY RT-PCR (RSV, FLU A&B, COVID)  RVPGX2  CULTURE, BLOOD (ROUTINE X 2)  CULTURE, BLOOD (ROUTINE X 2)  MAGNESIUM  LIPASE, BLOOD  LACTIC ACID, PLASMA  LACTIC ACID, PLASMA  PROTIME-INR  APTT  PHOSPHORUS  MAGNESIUM  COMPREHENSIVE METABOLIC PANEL    CT ABDOMEN PELVIS WO CONTRAST  Final Result      Medications   sodium chloride 0.9 % bolus 500 mL (has no administration in time range)  ceFEPIme (MAXIPIME) 2 g in sodium chloride 0.9 % 100 mL IVPB (has no administration in time range)  HYDROmorphone (DILAUDID) injection 0.5 mg (has no administration in time range)  vancomycin (VANCOCIN) IVPB 1000 mg/200 mL premix (has no administration in time range)  acetaminophen (TYLENOL) tablet 650 mg (has no administration in time range)    Or  acetaminophen (TYLENOL) suppository 650 mg (has no administration in time range)  ondansetron (ZOFRAN) injection 4 mg (has no administration in time range)  HYDROmorphone (DILAUDID) injection 0.5 mg (has no administration in time range)     Procedures  /  Critical Care .Critical Care  Performed by: Maudie Flakes, MD Authorized by: Maudie Flakes, MD   Critical care provider statement:    Critical care time (minutes):  35   Critical care was necessary to treat or prevent imminent or life-threatening deterioration of the following conditions:  Sepsis (Peritonitis as a complication of indwelling peritoneal dialysis catheter)   Critical care was time spent personally by me on the following activities:  Development of treatment plan with patient or surrogate, discussions with consultants, evaluation of patient's response to treatment, examination of patient, ordering and review of laboratory studies, ordering and review of radiographic studies, ordering and performing treatments and interventions, pulse oximetry, re-evaluation of patient's condition and review of old charts   ED Course and Medical Decision Making  Initial Impression and Ddx Peritonitis as a complication of indwelling peritoneal dialysis catheter.  Patient having continued fevers and abdominal pain despite a dwell of intra-abdominal antibiotics as an outpatient.  She is well-appearing but febrile here in the emergency department.  Labs are similar to baseline levels, no significant blood count or electrolyte  deviation.  CT scanning abdomen without contrast not demonstrating any overtly concerning features.  Discussed case with nephrology who will see patient in the morning and figure out another 12 treatment.  In the meantime nephrology is recommending IV antibiotics, admission.  Past medical/surgical history that increases complexity of ED encounter: ESRD  Interpretation of Diagnostics I personally reviewed the laboratory assessment and my interpretation is as follows: See above    Patient Reassessment and Ultimate Disposition/Management     Admission  Patient management required discussion with the following services or  consulting groups:  Hospitalist Service and Nephrology  Complexity of Problems Addressed Acute illness or injury that poses threat of life of bodily function  Additional Data Reviewed and Analyzed Further history obtained from: Further history from spouse/family member  Additional Factors Impacting ED Encounter Risk Consideration of hospitalization  Barth Kirks. Sedonia Small, [place] [email or web address]  Final Clinical Impressions(s) / ED Diagnoses     ICD-10-CM   1. Peritonitis ([place])  K65.9       ED Discharge Orders     None        Discharge Instructions Discussed with and Provided to Patient:   Discharge Instructions   None      Maudie Flakes, MD 10/16/22 [phone]

## 2022-10-16 NOTE — Sepsis Progress Note (Signed)
Elink monitoring for the code sepsis protocol.  

## 2022-10-17 ENCOUNTER — Encounter (HOSPITAL_COMMUNITY): Payer: Self-pay | Admitting: Internal Medicine

## 2022-10-17 DIAGNOSIS — N186 End stage renal disease: Secondary | ICD-10-CM | POA: Diagnosis not present

## 2022-10-17 DIAGNOSIS — E872 Acidosis, unspecified: Secondary | ICD-10-CM | POA: Diagnosis not present

## 2022-10-17 DIAGNOSIS — A419 Sepsis, unspecified organism: Secondary | ICD-10-CM | POA: Diagnosis not present

## 2022-10-17 DIAGNOSIS — K2100 Gastro-esophageal reflux disease with esophagitis, without bleeding: Secondary | ICD-10-CM

## 2022-10-17 DIAGNOSIS — K659 Peritonitis, unspecified: Principal | ICD-10-CM | POA: Insufficient documentation

## 2022-10-17 DIAGNOSIS — K650 Generalized (acute) peritonitis: Secondary | ICD-10-CM | POA: Diagnosis not present

## 2022-10-17 LAB — CBC
HCT: 24.1 % — ABNORMAL LOW (ref 36.0–46.0)
Hemoglobin: 7.6 g/dL — ABNORMAL LOW (ref 12.0–15.0)
MCH: 27.4 pg (ref 26.0–34.0)
MCHC: 31.5 g/dL (ref 30.0–36.0)
MCV: 87 fL (ref 80.0–100.0)
Platelets: 293 10*3/uL (ref 150–400)
RBC: 2.77 MIL/uL — ABNORMAL LOW (ref 3.87–5.11)
RDW: 16.5 % — ABNORMAL HIGH (ref 11.5–15.5)
WBC: 7.3 10*3/uL (ref 4.0–10.5)
nRBC: 0.3 % — ABNORMAL HIGH (ref 0.0–0.2)

## 2022-10-17 LAB — HEMOGLOBIN AND HEMATOCRIT, BLOOD
HCT: 24.5 % — ABNORMAL LOW (ref 36.0–46.0)
Hemoglobin: 7.7 g/dL — ABNORMAL LOW (ref 12.0–15.0)

## 2022-10-17 LAB — VANCOMYCIN, RANDOM: Vancomycin Rm: 43 ug/mL

## 2022-10-17 MED ORDER — DELFLEX-LC/2.5% DEXTROSE 394 MOSM/L IP SOLN
INTRAPERITONEAL | Status: DC
  Filled 2022-10-17: qty 3000

## 2022-10-17 MED ORDER — DELFLEX-LC/1.5% DEXTROSE 344 MOSM/L IP SOLN: INTRAPERITONEAL | Status: DC

## 2022-10-17 MED ORDER — DELFLEX-LC/1.5% DEXTROSE 344 MOSM/L IP SOLN
INTRAPERITONEAL | Status: DC
  Filled 2022-10-17: qty 6000

## 2022-10-17 MED ORDER — DELFLEX-LC/2.5% DEXTROSE 394 MOSM/L IP SOLN: INTRAPERITONEAL | Status: DC

## 2022-10-17 MED ORDER — PANTOPRAZOLE SODIUM 40 MG PO TBEC
40.0000 mg | DELAYED_RELEASE_TABLET | Freq: Two times a day (BID) | ORAL | Status: DC
  Administered 2022-10-17 – 2022-10-26 (×14): 40 mg via ORAL
  Filled 2022-10-17 (×15): qty 1

## 2022-10-17 MED ORDER — HYDROMORPHONE HCL 1 MG/ML IJ SOLN
0.5000 mg | INTRAMUSCULAR | Status: DC | PRN
  Administered 2022-10-17 – 2022-10-18 (×4): 0.5 mg via INTRAVENOUS
  Filled 2022-10-17 (×4): qty 1

## 2022-10-17 MED ORDER — MELATONIN 3 MG PO TABS
3.0000 mg | ORAL_TABLET | Freq: Every day | ORAL | Status: DC
  Administered 2022-10-17 – 2022-11-03 (×18): 3 mg via ORAL
  Filled 2022-10-17 (×18): qty 1

## 2022-10-17 NOTE — Progress Notes (Signed)
[facility]                                                                              Sheena Smith, is a 71 y.o. female, DOB - 15-May-1951, VOH:[id number] Admit date - 10/15/2022    Outpatient Primary MD for the patient is Sheena Noe, [name], MD  LOS - 1  days  Chief Complaint  Patient presents with   Abdominal Pain       Brief summary   Patient is a 71 year old female with HTN, ESRD on PD, lupus presented with abdominal pain for last 5 days.  She reported that a piece of her PD catheter came loose when she was at the dialysis unit for a dressing change about a week ago and then her symptoms started 5 days later.Patient describes the pain as crampy, worse across the lower right side of abdomen, subjective fevers, chills, nausea, dry heaves with decreased appetite and diarrhea.   Patient had PD cultures drawn at the outpatient unit and returned back positive for gram-negative rods on 12/13.  She received 1 dose of vancomycin and plan was to start ceftaz however pain became too severe when attempting dialysis, hence she came to ED instead. She reported that the catheter has been draining well although painful.  Poor p.o. intake in the last several days.  Patient was admitted for further workup  Assessment & Plan    Principal Problem: Sepsis secondary to acute peritonitis ([place]) in the setting of peritoneal dialysis catheter dysfunction -Presented with abdominal pain, febrile, tachypnea, leukopenia, source likely due to peritonitis.  Outpatient PD cultures+ negative rods -Nephrology following, continue empiric antibiotics -Continue IV vancomycin, cefepime  Active Problems:    ESRD on peritoneal dialysis [facility]) -Patient reported malfunctioning of PD catheter, and was repaired by the dialysis nurse -Management per nephrology     Metabolic acidosis with increased anion gap -CO2 19, anion gap of 18 on admission  -Awaiting abs from today    Anemia due to chronic  kidney disease -Hemoglobin 10.0 on admission, noted to be 7.6, will repeat H&H.  If hemoglobin 7.6 or lower, will transfuse 1 unit packed RBCs -No overt bleeding    Essential hypertension -BP stable, continue amlodipine, furosemide, irbesartan, propranolol, Aldactone    Lupus (HCC) -Continue Plaquenil    GERD (gastroesophageal reflux disease) -CT abdomen incidentally showed a hiatal hernia with distal esophageal thickening, may indicate esophagitis, nonemergent upper endoscopy recommended -Explained there is to the patient, placed on Protonix 40 mg twice daily for 1 month and then continue daily.  She will then follow-up with her PCP and GI outpatient for endoscopy.  Diarrhea -Unclear etiology, will check C. difficile, GI pathogen panel  Code Status: Full CODE STATUS DVT Prophylaxis:  heparin injection 5,000 Units Start: 10/16/22 1400 SCDs Start: 10/16/22 0437   Level of Care: Level of care: Telemetry Medical Family Communication: Updated patient, alert and oriented Disposition Plan:      Remains inpatient appropriate:     Procedures:  None  Consultants:   Nephrology  Antimicrobials:   Anti-infectives (From admission, onward)    Start     Dose/Rate Route Frequency Ordered  Stop   10/17/22 1000  ceFEPIme (MAXIPIME) 1 g in sodium chloride 0.9 % 100 mL IVPB        1 g 200 mL/hr over 30 Minutes Intravenous Every 24 hours 10/16/22 0954     10/17/22 1000  hydroxychloroquine (PLAQUENIL) tablet 200 mg        200 mg Oral Every other day 10/16/22 1256     10/16/22 0954  vancomycin variable dose per unstable renal function (pharmacist dosing)         Does not apply See admin instructions 10/16/22 0954     10/16/22 0430  vancomycin (VANCOCIN) IVPB 1000 mg/200 mL premix        1,000 mg 200 mL/hr over 60 Minutes Intravenous  Once 10/16/22 0426 10/16/22 0631   10/16/22 0415  ceFEPIme (MAXIPIME) 2 g in sodium chloride 0.9 % 100 mL IVPB        2 g 200 mL/hr over 30 Minutes Intravenous   Once 10/16/22 0414 10/16/22 0631          Medications  amLODipine  10 mg Oral Daily   furosemide  80 mg Oral BID   gentamicin cream  1 Application Topical Daily   heparin injection (subcutaneous)  5,000 Units Subcutaneous Q8H   hydroxychloroquine  200 mg Oral QODAY   irbesartan  300 mg Oral QPM   melatonin  3 mg Oral QHS   pantoprazole  40 mg Oral BID AC   propranolol  40 mg Oral QID   sodium chloride flush  3 mL Intravenous Q12H   spironolactone  25 mg Oral Daily   vancomycin variable dose per unstable renal function (pharmacist dosing)   Does not apply See admin instructions      Subjective:   Sheena Smith was seen and examined today.  Abdominal pain 5/10 in the right lower quadrant, no fevers this morning, no acute nausea or vomiting.   Objective:   Vitals:   10/17/22 0900 10/17/22 0940 10/17/22 1000 10/17/22 1100  BP: 115/63  109/61 110/62  Pulse: 80  77 73  Resp: (!) 22  (!) 21 19  Temp:  97.8 F (36.6 C)    TempSrc:      SpO2: 98%  96% 95%  Weight:      Height:       No intake or output data in the 24 hours ending 10/17/22 1227   Wt Readings from Last 3 Encounters:  10/16/22 51.7 kg  05/22/20 54 kg  05/21/20 53.9 kg     Exam General: Alert and oriented x 3, NAD Cardiovascular: S1 S2 auscultated,  RRR Respiratory: Clear to auscultation bilaterally, no wheezing, rales or rhonchi Gastrointestinal: Soft, TTP right lower abdomen, with guarding, NBS, PD cath left side Ext: no pedal edema bilaterally Neuro: Strength 5/5 upper and lower extremities bilaterally Psych: Normal affect and demeanor, alert and oriented x3     Data Reviewed:  I have personally reviewed following labs    CBC Lab Results  Component Value Date   WBC 7.3 10/17/2022   RBC 2.77 (L) 10/17/2022   HGB 7.6 (L) 10/17/2022   HCT 24.1 (L) 10/17/2022   MCV 87.0 10/17/2022   MCH 27.4 10/17/2022   PLT 293 10/17/2022   MCHC 31.5 10/17/2022   RDW 16.5 (H) 10/17/2022   LYMPHSABS 0.4  (L) 10/16/2022   MONOABS 0.2 10/16/2022   EOSABS 0.1 10/16/2022   BASOSABS 0.0 62/70/3500     Last metabolic panel Lab Results  Component Value Date  NA 133 (L) 10/16/2022   K 3.6 10/16/2022   CL 96 (L) 10/16/2022   CO2 19 (L) 10/16/2022   BUN 75 (H) 10/16/2022   CREATININE 13.01 (H) 10/16/2022   GLUCOSE 122 (H) 10/16/2022   GFRNONAA 3 (L) 10/16/2022   GFRAA 5 (L) 05/21/2020   CALCIUM 7.5 (L) 10/16/2022   PROT 6.8 10/16/2022   ALBUMIN 2.0 (L) 10/16/2022   LABGLOB 3.0 05/21/2020   AGRATIO 1.1 (L) 05/21/2020   BILITOT 2.3 (H) 10/16/2022   ALKPHOS 48 10/16/2022   AST 15 10/16/2022   ALT 10 10/16/2022   ANIONGAP 18 (H) 10/16/2022    CBG (last 3)  No results for input(s): "GLUCAP" in the last 72 hours.    Coagulation Profile: Recent Labs  Lab 10/16/22 0530  INR 1.3*     Radiology Studies: I have personally reviewed the imaging studies  DG Abd 1 View  Result Date: 10/16/2022 CLINICAL DATA:  End-stage renal disease. Assess peritoneal dialysis catheter placement. EXAM: ABDOMEN - 1 VIEW COMPARISON:  CT earlier today. FINDINGS: Peritoneal dialysis catheter enters the left pelvis, courses into the right pelvis and is looped in the right pelvis. No discontinuity or kink. Midline skin staples. Air-fluid levels throughout bowel loops in the central abdomen. Enteric sutures in the left abdomen. The small foci of free air on CT are not well delineated on the current exam IMPRESSION: 1. Peritoneal dialysis catheter looped in the right pelvis. No discontinuity or kink. 2. Air-fluid levels throughout bowel loops in the central abdomen, may represent ileus. Electronically Signed   By: Keith Rake M.D.   On: 10/16/2022 18:41   CT ABDOMEN PELVIS WO CONTRAST  Result Date: 10/16/2022 CLINICAL DATA:  Acute abdominal pain EXAM: CT ABDOMEN AND PELVIS WITHOUT CONTRAST TECHNIQUE: Multidetector CT imaging of the abdomen and pelvis was performed following the standard protocol without IV  contrast. RADIATION DOSE REDUCTION: This exam was performed according to the departmental dose-optimization program which includes automated exposure control, adjustment of the mA and/or kV according to patient size and/or use of iterative reconstruction technique. COMPARISON:  None Available. FINDINGS: Lower Chest: Small pleural effusions Hepatobiliary: Normal hepatic contours. No intra- or extrahepatic biliary dilatation. Small volume perihepatic ascites. Normal gallbladder. Pancreas: Normal pancreas. No ductal dilatation or peripancreatic fluid collection. Spleen: Normal. Adrenals/Urinary Tract: The adrenal glands are normal. Bilateral renal atrophy. There is a left paramedian approach peritoneal dialysis catheter that is coiled in the right lower quadrant. Intermediate volume free fluid in the pelvis. There are a few expected punctate foci of intra-abdominal free air. Stomach/Bowel: Hiatal hernia with distal esophageal thickening. No small bowel dilatation or inflammation. No focal colonic abnormality. Vascular/Lymphatic: There is calcific atherosclerosis of the abdominal aorta. No lymphadenopathy. Reproductive: Fibroid uterus Other: None. Musculoskeletal: No bony spinal canal stenosis or focal osseous abnormality. IMPRESSION: 1. No acute abnormality of the abdomen or pelvis. 2. Peritoneal dialysis catheter coiled in the right lower quadrant with intermediate volume free fluid in the pelvis. 3. Small pleural effusions. 4. Hiatal hernia with distal esophageal thickening, which may indicate esophagitis. Correlation with non-emergent upper endoscopy recommended to exclude neoplasm. Aortic atherosclerosis (ICD10-I70.0). Electronically Signed   By: Ulyses Jarred M.D.   On: 10/16/2022 01:15       Kelsen Celona M.D. [facility] 10/17/2022, 12:27 PM  Available via Epic secure chat 7am-7pm After 7 pm, please refer to night coverage provider listed on amion.

## 2022-10-17 NOTE — Progress Notes (Signed)
Pt's PD machine has been alarming. RN has called and spoke with PD nurse multiple times. PD nurse did come and assess and machine started alarming again after nurse left. This RN was then told by PD nurse to call [phone] number on machine for assistance due to PD nurse being tied up in ICU with another treatment. This RN spoke with gentleman on help line (after being on hold for 20 minutes) and was told to resume the treatment, however an alert for "slow flow" came up and RN was then told to check the line for fibrin. This RN did see fibrin in the line. This RN was told to end the treatment and contact the PD nurse to [facility] the patient. This RN called and spoke with PD nurse to inform and PD nurse stated that he was still in ICU with another treatment and would come afterward. Pt was made aware of situation and what this RN was told and patient said she understands that it is probably clogged.   Sheena Smith

## 2022-10-17 NOTE — ED Notes (Signed)
Patient in bed eating at snack, no s/s of any distress. No verbal c/o pain or discomfort. Will continue to monitor

## 2022-10-17 NOTE — ED Notes (Signed)
ED TO INPATIENT HANDOFF REPORT  ED Nurse Name and Phone #: Philip Aspen Name/Age/Gender [name] 71 y.o. female Room/Bed: 013C/013C  Code Status   Code Status: Full Code  Home/SNF/Other Home Patient oriented to: self, place, time, and situation Is this baseline? Yes   Triage Complete: Triage complete  Chief Complaint Acute peritonitis ([place]) [K65.0] Peritonitis ([place]) [K65.9]  Triage Note Pt does peritoneal dialysis. Started with bad cramping a few days ago. They tested her fluid from bag and stated it was peritonitis. She was started on antibiotics and has taken 1 full day in her bag. Stayed in stomach for 6 hours. Started to do dialysis today but could not tolerate it to the pain. Going tomorrow for next round of antibiotics.    Allergies Allergies  Allergen Reactions   Nsaids     "Kidney Disease. Exception to this NSAID intolerance is aspirin 81-'325mg'$  daily"'   Other     Per pt, states that she was informed she was allergic to sulfa after allergy testing  "Kidney Disease. Exception to this NSAID intolerance is aspirin 81-'325mg'$  daily"'   Atenolol     palpitation   Elemental Sulfur     Level of Care/Admitting Diagnosis ED Disposition     ED Disposition  Admit   Condition  --   [place]: [place] [[id number]]  Level of Care: Telemetry Medical [[id number]]  May admit patient to [facility] or [facility] if equivalent level of care is available:: No  Covid Evaluation: Asymptomatic - no recent exposure (last 10 days) testing not required  Diagnosis: Peritonitis [facility]) [[id number]]  Admitting Physician: Norval Morton [[id number]]  Attending Physician: RAI, RIPUDEEP K [[id number]]  Certification:: I certify this patient will need inpatient services for at least 2 midnights          B Medical/Surgery History Past Medical History:  Diagnosis Date   Hypertension    Lupus ([place])    Renal failure    Past Surgical History:  Procedure  Laterality Date   FOOT SURGERY     SMALL INTESTINE SURGERY       A IV Location/Drains/Wounds Patient Lines/Drains/Airways Status     Active Line/Drains/Airways     Name Placement date Placement time Site Days   Peripheral IV 10/16/22 20 G Distal;Left;Posterior Forearm 10/16/22  0443  Forearm  1   Peripheral IV 10/16/22 22 G Posterior;Right Forearm 10/16/22  0500  Forearm  1            Intake/Output Last 24 hours No intake or output data in the 24 hours ending 10/17/22 1314  Labs/Imaging Results for orders placed or performed during the hospital encounter of 10/15/22 (from the past 48 hour(s))  Comprehensive metabolic panel     Status: Abnormal   Collection Time: 10/16/22 12:31 AM  Result Value Ref Range   Sodium 133 (L) 135 - 145 mmol/L   Potassium 3.6 3.5 - 5.1 mmol/L   Chloride 96 (L) 98 - 111 mmol/L   CO2 19 (L) 22 - 32 mmol/L   Glucose, Bld 122 (H) 70 - 99 mg/dL    Comment: Glucose reference range applies only to samples taken after fasting for at least 8 hours.   BUN 75 (H) 8 - 23 mg/dL   Creatinine, Ser 13.01 (H) 0.44 - 1.00 mg/dL   Calcium 7.5 (L) 8.9 - 10.3 mg/dL   Total Protein 6.8 6.5 - 8.1 g/dL   Albumin 2.0 (L) 3.5 -  5.0 g/dL   AST 15 15 - 41 U/L   ALT 10 0 - 44 U/L   Alkaline Phosphatase 48 38 - 126 U/L   Total Bilirubin 2.3 (H) 0.3 - 1.2 mg/dL   GFR, Estimated 3 (L) >60 mL/min    Comment: (NOTE) Calculated using the CKD-EPI Creatinine Equation (2021)    Anion gap 18 (H) 5 - 15    Comment: Performed at [facility] [address] [place], [place] [address]  CBC with Differential     Status: Abnormal   Collection Time: 10/16/22 12:31 AM  Result Value Ref Range   WBC 2.7 (L) 4.0 - 10.5 K/uL   RBC 3.70 (L) 3.87 - 5.11 MIL/uL   Hemoglobin 10.0 (L) 12.0 - 15.0 g/dL   HCT 32.3 (L) 36.0 - 46.0 %   MCV 87.3 80.0 - 100.0 fL   MCH 27.0 26.0 - 34.0 pg   MCHC 31.0 30.0 - 36.0 g/dL   RDW 16.3 (H) 11.5 - 15.5 %   Platelets 381 150 - 400 K/uL   nRBC  3.0 (H) 0.0 - 0.2 %   Neutrophils Relative % 75 %   Neutro Abs 2.0 1.7 - 7.7 K/uL   Lymphocytes Relative 14 %   Lymphs Abs 0.4 (L) 0.7 - 4.0 K/uL   Monocytes Relative 8 %   Monocytes Absolute 0.2 0.1 - 1.0 K/uL   Eosinophils Relative 3 %   Eosinophils Absolute 0.1 0.0 - 0.5 K/uL   Basophils Relative 0 %   Basophils Absolute 0.0 0.0 - 0.1 K/uL   Immature Granulocytes 0 %   Abs Immature Granulocytes 0.01 0.00 - 0.07 K/uL   Bite Cells PRESENT    Tear Drop Cells PRESENT    Polychromasia PRESENT     Comment: Performed at [facility], [place] [address] [place], [place] [address]  Magnesium     Status: None   Collection Time: 10/16/22 12:31 AM  Result Value Ref Range   Magnesium 1.7 1.7 - 2.4 mg/dL    Comment: Performed at [facility], [place] [address] [place], [place] [address]  Lipase, blood     Status: None   Collection Time: 10/16/22 12:31 AM  Result Value Ref Range   Lipase 40 11 - 51 U/L    Comment: Performed at [facility], [place] [address] [place], [place] [address]  Lactic acid, plasma     Status: None   Collection Time: 10/16/22 12:31 AM  Result Value Ref Range   Lactic Acid, Venous 1.0 0.5 - 1.9 mmol/L    Comment: Performed at [facility] [address], [place], [place] [address]  Blood Culture (routine x 2)     Status: None (Preliminary result)   Collection Time: 10/16/22  4:13 AM   Specimen: BLOOD LEFT FOREARM  Result Value Ref Range   Specimen Description BLOOD LEFT FOREARM    Special Requests      BOTTLES DRAWN AEROBIC AND ANAEROBIC Blood Culture adequate volume   Culture      NO GROWTH 1 DAY Performed at [facility], [place] [address] [place], [place] [address]    Report Status PENDING   Lactic acid, plasma     Status: None   Collection Time: 10/16/22  4:38 AM  Result Value Ref Range   Lactic Acid, Venous 1.4 0.5 - 1.9 mmol/L    Comment: Performed at [facility] [place],  [place] [address]  Resp panel by  RT-PCR (RSV, Flu A&B, Covid) Anterior Nasal Swab     Status: None   Collection Time: 10/16/22  4:38 AM   Specimen: Anterior Nasal Swab  Result Value Ref Range   SARS Coronavirus 2 by RT PCR NEGATIVE NEGATIVE    Comment: (NOTE) SARS-CoV-2 target nucleic acids are NOT DETECTED.  The SARS-CoV-2 RNA is generally detectable in upper respiratory specimens during the acute phase of infection. The lowest concentration of SARS-CoV-2 viral copies this assay can detect is 138 copies/mL. A negative result does not preclude SARS-Cov-2 infection and should not be used as the sole basis for treatment or other patient management decisions. A negative result may occur with  improper specimen collection/handling, submission of specimen other than nasopharyngeal swab, presence of viral mutation(s) within the areas targeted by this assay, and inadequate number of viral copies(<138 copies/mL). A negative result must be combined with clinical observations, patient history, and epidemiological information. The expected result is Negative.  Fact Sheet for Patients:  EntrepreneurPulse.com.au  Fact Sheet for Healthcare Providers:  IncredibleEmployment.be  This test is no t yet approved or cleared by the [place] FDA and  has been authorized for detection and/or diagnosis of SARS-CoV-2 by FDA under an Emergency Use Authorization (EUA). This EUA will remain  in effect (meaning this test can be used) for the duration of the COVID-19 declaration under Section 564(b)(1) of the Act, 21 U.S.C.section 360bbb-3(b)(1), unless the authorization is terminated  or revoked sooner.       Influenza A by PCR NEGATIVE NEGATIVE   Influenza B by PCR NEGATIVE NEGATIVE    Comment: (NOTE) The Xpert Xpress SARS-CoV-2/FLU/RSV plus assay is intended as an aid in the diagnosis of influenza from Nasopharyngeal swab specimens and should not be used as a sole basis for treatment. Nasal  washings and aspirates are unacceptable for Xpert Xpress SARS-CoV-2/FLU/RSV testing.  Fact Sheet for Patients: EntrepreneurPulse.com.au  Fact Sheet for Healthcare Providers: IncredibleEmployment.be  This test is not yet approved or cleared by the [place] FDA and has been authorized for detection and/or diagnosis of SARS-CoV-2 by FDA under an Emergency Use Authorization (EUA). This EUA will remain in effect (meaning this test can be used) for the duration of the COVID-19 declaration under Section 564(b)(1) of the Act, 21 U.S.C. section 360bbb-3(b)(1), unless the authorization is terminated or revoked.     Resp Syncytial Virus by PCR NEGATIVE NEGATIVE    Comment: (NOTE) Fact Sheet for Patients: EntrepreneurPulse.com.au  Fact Sheet for Healthcare Providers: IncredibleEmployment.be  This test is not yet approved or cleared by the [place] FDA and has been authorized for detection and/or diagnosis of SARS-CoV-2 by FDA under an Emergency Use Authorization (EUA). This EUA will remain in effect (meaning this test can be used) for the duration of the COVID-19 declaration under Section 564(b)(1) of the Act, 21 U.S.C. section 360bbb-3(b)(1), unless the authorization is terminated or revoked.  Performed at [facility], [place] [address] [place], [place] [address]   Blood Culture (routine x 2)     Status: None (Preliminary result)   Collection Time: 10/16/22  5:00 AM   Specimen: BLOOD RIGHT FOREARM  Result Value Ref Range   Specimen Description BLOOD RIGHT FOREARM    Special Requests      BOTTLES DRAWN AEROBIC AND ANAEROBIC Blood Culture adequate volume   Culture      NO GROWTH 1 DAY Performed at [facility], [place],  [place] [address]    Report Status PENDING   Protime-INR     Status: Abnormal   Collection Time: 10/16/22  5:30 AM  Result Value Ref Range   Prothrombin  Time 15.8 (H) 11.4 - 15.2 seconds   INR 1.3 (H) 0.8 - 1.2    Comment: (NOTE) INR goal varies based on device and disease states. Performed at [facility], [place] [address], [place], [place] [address]   APTT     Status: Abnormal   Collection Time: 10/16/22  5:30 AM  Result Value Ref Range   aPTT 39 (H) 24 - 36 seconds    Comment:        IF BASELINE aPTT IS ELEVATED, SUGGEST PATIENT RISK ASSESSMENT BE USED TO DETERMINE APPROPRIATE ANTICOAGULANT THERAPY. Performed at [facility], [place] [address] [place], [place] [address]   CBC     Status: Abnormal   Collection Time: 10/17/22  6:05 AM  Result Value Ref Range   WBC 7.3 4.0 - 10.5 K/uL   RBC 2.77 (L) 3.87 - 5.11 MIL/uL   Hemoglobin 7.6 (L) 12.0 - 15.0 g/dL   HCT 24.1 (L) 36.0 - 46.0 %   MCV 87.0 80.0 - 100.0 fL   MCH 27.4 26.0 - 34.0 pg   MCHC 31.5 30.0 - 36.0 g/dL   RDW 16.5 (H) 11.5 - 15.5 %   Platelets 293 150 - 400 K/uL   nRBC 0.3 (H) 0.0 - 0.2 %    Comment: Performed at [facility] [address], [place], [place] [address]  Vancomycin, random     Status: None   Collection Time: 10/17/22  6:05 AM  Result Value Ref Range   Vancomycin Rm 43 ug/mL    Comment:        Random Vancomycin therapeutic range is dependent on dosage and time of specimen collection. A peak range is 20-40 ug/mL A trough range is 5-15 ug/mL        Performed at [facility] [address], [place], [place] [address]   Hemoglobin and hematocrit, blood     Status: Abnormal   Collection Time: 10/17/22 12:53 PM  Result Value Ref Range   Hemoglobin 7.7 (L) 12.0 - 15.0 g/dL   HCT 24.5 (L) 36.0 - 46.0 %    Comment: Performed at [facility], [place] [address] [place], [place] [address]   DG Abd 1 View  Result Date: 10/16/2022 CLINICAL DATA:  End-stage renal disease. Assess peritoneal dialysis catheter placement. EXAM: ABDOMEN - 1 VIEW COMPARISON:  CT earlier today. FINDINGS: Peritoneal dialysis catheter enters the left  pelvis, courses into the right pelvis and is looped in the right pelvis. No discontinuity or kink. Midline skin staples. Air-fluid levels throughout bowel loops in the central abdomen. Enteric sutures in the left abdomen. The small foci of free air on CT are not well delineated on the current exam IMPRESSION: 1. Peritoneal dialysis catheter looped in the right pelvis. No discontinuity or kink. 2. Air-fluid levels throughout bowel loops in the central abdomen, may represent ileus. Electronically Signed   By: Keith Rake M.D.   On: 10/16/2022 18:41   CT ABDOMEN PELVIS WO CONTRAST  Result Date: 10/16/2022 CLINICAL DATA:  Acute abdominal pain EXAM: CT ABDOMEN AND PELVIS WITHOUT CONTRAST TECHNIQUE: Multidetector CT imaging of the abdomen and pelvis was performed following the standard protocol without IV contrast. RADIATION DOSE REDUCTION: This exam was performed according to the departmental dose-optimization program  which includes automated exposure control, adjustment of the mA and/or kV according to patient size and/or use of iterative reconstruction technique. COMPARISON:  None Available. FINDINGS: Lower Chest: Small pleural effusions Hepatobiliary: Normal hepatic contours. No intra- or extrahepatic biliary dilatation. Small volume perihepatic ascites. Normal gallbladder. Pancreas: Normal pancreas. No ductal dilatation or peripancreatic fluid collection. Spleen: Normal. Adrenals/Urinary Tract: The adrenal glands are normal. Bilateral renal atrophy. There is a left paramedian approach peritoneal dialysis catheter that is coiled in the right lower quadrant. Intermediate volume free fluid in the pelvis. There are a few expected punctate foci of intra-abdominal free air. Stomach/Bowel: Hiatal hernia with distal esophageal thickening. No small bowel dilatation or inflammation. No focal colonic abnormality. Vascular/Lymphatic: There is calcific atherosclerosis of the abdominal aorta. No lymphadenopathy.  Reproductive: Fibroid uterus Other: None. Musculoskeletal: No bony spinal canal stenosis or focal osseous abnormality. IMPRESSION: 1. No acute abnormality of the abdomen or pelvis. 2. Peritoneal dialysis catheter coiled in the right lower quadrant with intermediate volume free fluid in the pelvis. 3. Small pleural effusions. 4. Hiatal hernia with distal esophageal thickening, which may indicate esophagitis. Correlation with non-emergent upper endoscopy recommended to exclude neoplasm. Aortic atherosclerosis (ICD10-I70.0). Electronically Signed   By: Ulyses Jarred M.D.   On: 10/16/2022 01:15    Pending Labs Unresulted Labs (From admission, onward)     Start     Ordered   10/18/22 0500  CBC  Tomorrow morning,   R        10/17/22 0743   10/18/22 1937  Basic metabolic panel  Daily,   R      10/17/22 0743   10/17/22 1241  Gastrointestinal Panel by PCR , Stool  (Gastrointestinal Panel by PCR, Stool                                                                                                                                                     **Does Not include CLOSTRIDIUM DIFFICILE testing. **If CDIFF testing is needed, place order from the "C Difficile Testing" order set.**)  Once,   R        10/17/22 1240   10/17/22 0742  C Difficile Quick Screen w PCR reflex  (C Difficile quick screen w PCR reflex panel )  Once, for 24 hours,   TIMED       References:    CDiff Information Tool   10/17/22 0741   10/17/22 0500  Body fluid cell count with differential  Tomorrow morning,   R       Question:  Are there also cytology or pathology orders on this specimen?  Answer:  No   10/16/22 1412   10/17/22 0500  Body fluid culture w Gram Stain  Once,   R       Question:  Are there also cytology or pathology orders on this  specimen?  Answer:  No   10/16/22 1412   10/16/22 0500  Comprehensive metabolic panel  Once,   R        10/16/22 0500   10/16/22 0500  Magnesium  Once,   R        10/16/22 0500   10/16/22 0500   Phosphorus  Once,   R        10/16/22 0500            Vitals/Pain Today's Vitals   10/17/22 0900 10/17/22 0940 10/17/22 1000 10/17/22 1100  BP: 115/63  109/61 110/62  Pulse: 80  77 73  Resp: (!) 22  (!) 21 19  Temp:  97.8 F (36.6 C)    TempSrc:      SpO2: 98%  96% 95%  Weight:      Height:      PainSc:        Isolation Precautions Enteric precautions (UV disinfection)  Medications Medications  acetaminophen (TYLENOL) tablet 650 mg (650 mg Oral Given 10/17/22 0852)    Or  acetaminophen (TYLENOL) suppository 650 mg ( Rectal See Alternative 10/17/22 0852)  ondansetron (ZOFRAN) injection 4 mg (4 mg Intravenous Given 10/16/22 2129)  sodium chloride flush (NS) 0.9 % injection 3 mL (3 mLs Intravenous Given 10/17/22 0857)  albuterol (PROVENTIL) (2.5 MG/3ML) 0.083% nebulizer solution 2.5 mg (has no administration in time range)  heparin injection 5,000 Units (5,000 Units Subcutaneous Given 10/17/22 0558)  ceFEPIme (MAXIPIME) 1 g in sodium chloride 0.9 % 100 mL IVPB (0 g Intravenous Stopped 10/17/22 0939)  vancomycin variable dose per unstable renal function (pharmacist dosing) (has no administration in time range)  amLODipine (NORVASC) tablet 10 mg (10 mg Oral Given 10/17/22 0853)  furosemide (LASIX) tablet 80 mg (80 mg Oral Given 10/17/22 0854)  irbesartan (AVAPRO) tablet 300 mg (300 mg Oral Given 10/16/22 1810)  propranolol (INDERAL) tablet 40 mg (40 mg Oral Given 10/17/22 0857)  spironolactone (ALDACTONE) tablet 25 mg (25 mg Oral Given 10/17/22 0858)  hydroxychloroquine (PLAQUENIL) tablet 200 mg (200 mg Oral Given 10/17/22 0858)  traMADol (ULTRAM) tablet 50 mg (50 mg Oral Given 10/16/22 2344)  gentamicin cream (GARAMYCIN) 0.1 % 1 Application (1 Application Topical Not Given 10/16/22 1708)  dialysis solution 1.5% low-MG/low-CA dianeal solution (has no administration in time range)  melatonin tablet 3 mg (has no administration in time range)  pantoprazole (PROTONIX) EC  tablet 40 mg (40 mg Oral Given 10/17/22 0853)  sodium chloride 0.9 % bolus 500 mL (0 mLs Intravenous Stopped 10/16/22 0631)  ceFEPIme (MAXIPIME) 2 g in sodium chloride 0.9 % 100 mL IVPB (0 g Intravenous Stopped 10/16/22 0631)  HYDROmorphone (DILAUDID) injection 0.5 mg (0.5 mg Intravenous Given 10/16/22 0509)  vancomycin (VANCOCIN) IVPB 1000 mg/200 mL premix (0 mg Intravenous Stopped 10/16/22 0631)    Mobility walks Moderate fall risk   Focused Assessments Renal Assessment Handoff:  Hemodialysis Schedule: PD at Home per patient  Last Hemodialysis date and time:    Restricted appendage: PD   R Recommendations: See Admitting Provider Note  Report given to:   Additional Notes:

## 2022-10-17 NOTE — Progress Notes (Signed)
Pharmacy Antibiotic Note  Sheena Smith is a 71 y.o. female admitted on 10/15/2022 presenting with acute peritonitis.  Pharmacy has been consulted for vancomycin and cefepime dosing. Vancomycin loading dose and cefepime 2g IV given prior to transfer this AM, hx ESRD on PD  Vancomycin random level today 43  Plan: Cefepime 1g IV q 24h Vancomycin variable dosing based on levels, no further doses today Monitor PD schedule, Cx and clinical progression to narrow Vancomycin random level as needed  Height: '5\' 2"'$  (157.5 cm) Weight: 51.7 kg (114 lb) IBW/kg (Calculated) : 50.1  Temp (24hrs), Avg:98.7 F (37.1 C), Min:97.8 F (36.6 C), Max:99.2 F (37.3 C)  Recent Labs  Lab 10/16/22 0031 10/16/22 0438 10/17/22 0605  WBC 2.7*  --  7.3  CREATININE 13.01*  --   --   LATICACIDVEN 1.0 1.4  --   VANCORANDOM  --   --  43     Estimated Creatinine Clearance: 3.2 mL/min (A) (by C-G formula based on SCr of 13.01 mg/dL (H)).    Allergies  Allergen Reactions   Nsaids     "Kidney Disease. Exception to this NSAID intolerance is aspirin 81-'325mg'$  daily"'   Other     Per pt, states that she was informed she was allergic to sulfa after allergy testing  "Kidney Disease. Exception to this NSAID intolerance is aspirin 81-'325mg'$  daily"'   Atenolol     palpitation   Elemental Sulfur     Bertis Ruddy, PharmD, [facility] Clinical Pharmacist ED Pharmacist Phone # [phone] 10/17/2022 8:21 AM

## 2022-10-17 NOTE — Plan of Care (Signed)

## 2022-10-17 NOTE — Progress Notes (Incomplete)
Pt requested to speak with PD nurse. RN called HD and spoke with PD nurse Nicki Reaper), advised to

## 2022-10-17 NOTE — ED Notes (Signed)
Called Dialysis for dialysis RN to complete body fluid cell count and culture, spoke to [employer]. Was informed specimen to be collected when patient dialysis treatment completed.

## 2022-10-17 NOTE — Progress Notes (Signed)
Patient received from ED via stretcher.  Patient is alert and oriented x 4, ambulatory.  PD catheter noted on LLQ, open to air, catheter clamped.  Area around catheter cleansed, garamycin ointment and dressing applied.

## 2022-10-17 NOTE — Progress Notes (Signed)
Admit: 10/15/2022 LOS: 1  62F ESRD on PD with peritonitis  Subjective:  Feels improved, tolerating some PO, pain lessened PD Cx growing K pneumoniae On Vanc (pending level) and cefepime Updated from outpt HD: Still Cx is GNRs in both bottles Still in ED so didn't cycle overnight; apparently now has a room  No intake/output data recorded.  Filed Weights   10/16/22 0337  Weight: 51.7 kg    Scheduled Meds:  amLODipine  10 mg Oral Daily   furosemide  80 mg Oral BID   gentamicin cream  1 Application Topical Daily   heparin injection (subcutaneous)  5,000 Units Subcutaneous Q8H   hydroxychloroquine  200 mg Oral QODAY   irbesartan  300 mg Oral QPM   melatonin  3 mg Oral QHS   pantoprazole  40 mg Oral BID AC   propranolol  40 mg Oral QID   sodium chloride flush  3 mL Intravenous Q12H   spironolactone  25 mg Oral Daily   vancomycin variable dose per unstable renal function (pharmacist dosing)   Does not apply See admin instructions   Continuous Infusions:  ceFEPime (MAXIPIME) IV Stopped (10/17/22 0939)   dialysis solution 1.5% low-MG/low-CA     PRN Meds:.acetaminophen **OR** acetaminophen, albuterol, ondansetron (ZOFRAN) IV, traMADol  Current Labs: reviewed   Called outpt [facility] 12/15: Outpt PD Fluid Cx: GNRs -- klebsiella pneumoniae Outpt PD Cell Count: 3976 TNCs 10/14/22  Physical Exam:  Blood pressure 110/62, pulse 73, temperature 98.3 F (36.8 C), resp. rate 19, height '5\' 2"'$  (1.575 m), weight 51.7 kg, SpO2 95 %. NAD wa/wd RRR CTAB Mild TTP esp R side but says better than before  PD Exit site pristine No LEE Nonfocal NCAT EOMI  CCPD Rx 1.8L x 5, all 1.5%, dry day  A Klebsiella pneumoniae PD Peritonitis; clincally improving ESRD on OD HTN on CCB, MRB, ARB as outpt Hx/o SLE Anemia, Hb 7s  P Can stop vanc, cont cefepime for now Hopefully will rec ABX sensitivity data over the weekend Need repeat cell counts completed Once tolerating PO and  clinically stable should be ok for DC and outpt mgmt with her home therapies group Trend Hb Medication Issues; Preferred narcotic agents for pain control are hydromorphone, fentanyl, and methadone. Morphine should not be used.  Baclofen should be avoided Avoid oral sodium phosphate and magnesium citrate based laxatives / bowel preps    Pearson Grippe MD 10/17/2022, 2:19 PM  Recent Labs  Lab 10/16/22 0031  NA 133*  K 3.6  CL 96*  CO2 19*  GLUCOSE 122*  BUN 75*  CREATININE 13.01*  CALCIUM 7.5*   Recent Labs  Lab 10/16/22 0031 10/17/22 0605 10/17/22 1253  WBC 2.7* 7.3  --   NEUTROABS 2.0  --   --   HGB 10.0* 7.6* 7.7*  HCT 32.3* 24.1* 24.5*  MCV 87.3 87.0  --   PLT 381 293  --

## 2022-10-18 DIAGNOSIS — N186 End stage renal disease: Secondary | ICD-10-CM | POA: Diagnosis not present

## 2022-10-18 DIAGNOSIS — E872 Acidosis, unspecified: Secondary | ICD-10-CM | POA: Diagnosis not present

## 2022-10-18 DIAGNOSIS — K650 Generalized (acute) peritonitis: Secondary | ICD-10-CM | POA: Diagnosis not present

## 2022-10-18 DIAGNOSIS — A419 Sepsis, unspecified organism: Secondary | ICD-10-CM | POA: Diagnosis not present

## 2022-10-18 LAB — BASIC METABOLIC PANEL
Anion gap: 22 — ABNORMAL HIGH (ref 5–15)
BUN: 97 mg/dL — ABNORMAL HIGH (ref 8–23)
CO2: 15 mmol/L — ABNORMAL LOW (ref 22–32)
Calcium: 6.9 mg/dL — ABNORMAL LOW (ref 8.9–10.3)
Chloride: 95 mmol/L — ABNORMAL LOW (ref 98–111)
Creatinine, Ser: 15.19 mg/dL — ABNORMAL HIGH (ref 0.44–1.00)
GFR, Estimated: 2 mL/min — ABNORMAL LOW (ref >60)
Glucose, Bld: 107 mg/dL — ABNORMAL HIGH (ref 70–99)
Potassium: 4.2 mmol/L (ref 3.5–5.1)
Sodium: 132 mmol/L — ABNORMAL LOW (ref 135–145)

## 2022-10-18 LAB — CBC
HCT: 26.6 % — ABNORMAL LOW (ref 36.0–46.0)
Hemoglobin: 8.6 g/dL — ABNORMAL LOW (ref 12.0–15.0)
MCH: 27 pg (ref 26.0–34.0)
MCHC: 32.3 g/dL (ref 30.0–36.0)
MCV: 83.4 fL (ref 80.0–100.0)
Platelets: 391 10*3/uL (ref 150–400)
RBC: 3.19 MIL/uL — ABNORMAL LOW (ref 3.87–5.11)
RDW: 16.7 % — ABNORMAL HIGH (ref 11.5–15.5)
WBC: 5.4 10*3/uL (ref 4.0–10.5)
nRBC: 0.6 % — ABNORMAL HIGH (ref 0.0–0.2)

## 2022-10-18 LAB — BODY FLUID CELL COUNT WITH DIFFERENTIAL
Eos, Fluid: 12 %
Eos, Fluid: 13 %
Lymphs, Fluid: 2 %
Lymphs, Fluid: 5 %
Monocyte-Macrophage-Serous Fluid: 1 % — ABNORMAL LOW (ref 50–90)
Monocyte-Macrophage-Serous Fluid: 1 % — ABNORMAL LOW (ref 50–90)
Neutrophil Count, Fluid: 82 % — ABNORMAL HIGH (ref 0–25)
Neutrophil Count, Fluid: 84 % — ABNORMAL HIGH (ref 0–25)
Total Nucleated Cell Count, Fluid: 1836 cu mm — ABNORMAL HIGH (ref 0–1000)
Total Nucleated Cell Count, Fluid: 332 cu mm (ref 0–1000)

## 2022-10-18 MED ORDER — DELFLEX-LC/2.5% DEXTROSE 394 MOSM/L IP SOLN: INTRAPERITONEAL | Status: DC

## 2022-10-18 MED ORDER — HEPARIN SODIUM (PORCINE) 1000 UNIT/ML IJ SOLN
500.0000 [IU] | Freq: Once | INTRAMUSCULAR | Status: DC
  Filled 2022-10-18: qty 0.5

## 2022-10-18 MED ORDER — ALUM & MAG HYDROXIDE-SIMETH 200-200-20 MG/5ML PO SUSP
30.0000 mL | Freq: Four times a day (QID) | ORAL | Status: DC | PRN
  Administered 2022-10-18 – 2022-10-23 (×3): 30 mL via ORAL
  Filled 2022-10-18 (×4): qty 30

## 2022-10-18 MED ORDER — DELFLEX-LC/1.5% DEXTROSE 344 MOSM/L IP SOLN: INTRAPERITONEAL | Status: DC

## 2022-10-18 NOTE — Plan of Care (Signed)
  Problem: Education: Goal: Knowledge of General Education information will improve Description: Including pain rating scale, medication(s)/side effects and non-pharmacologic comfort measures Outcome: Progressing   Problem: Activity: Goal: Risk for activity intolerance will decrease Outcome: Progressing   Problem: Pain Managment: Goal: General experience of comfort will improve Outcome: Progressing   

## 2022-10-18 NOTE — Progress Notes (Signed)
   10/17/22 2314  Peritoneal Catheter Left lower abdomen  No placement date or time found.   Catheter Location: Left lower abdomen  Catheter status Deaccessed  Dressing Gauze/Drain sponge  Dressing Status Clean, Dry, Intact  Completion  Treatment Status Complete (RN called card and was advised to turn off, due to fiber clotting out drain to bags.)  Initial Drain Volume 9  Average Dwell Time-Hour(s) 1  Average Dwell Time-Min(s) 30  Average Drain Time 112  Total Therapy Volume 3  Total Therapy Time-Hour(s) 3  Total Therapy Time-Min(s) 56  Effluent Appearance Cloudy;Yellow;Clear (was clear, cloudy, and yellow. but not fiberous)  Fluid Balance - CCPD  Total UF (+ value on cycler, pt loss) -1460 mL  Procedure Comments  Tolerated treatment well? No (comment) (w/ c/o swelling pain)  Peritoneal Dialysis Comments TX not fin. before it was stopped.  Education / Care Plan  Dialysis Education Provided Yes  Hand-Off documentation  Handoff Received Received from shift RN/LPN  Report received from (Full Name) Rozetta Nunnery., RN Rozetta Nunnery., RN)   Alarms were called on but phone adviser to floor RN to stop TX due to PT slow drain.

## 2022-10-18 NOTE — Progress Notes (Signed)
[facility]                                                                              Taraann Olthoff, is a 71 y.o. female, DOB - [birth year], VOZ:[id number] Admit date - 10/15/2022    Outpatient Primary MD for the patient is [name], [name], MD  LOS - 2  days  Chief Complaint  Patient presents with   Abdominal Pain       Brief summary   Patient is a 71 year old female with HTN, ESRD on PD, lupus presented with abdominal pain for last 5 days.  She reported that a piece of her PD catheter came loose when she was at the dialysis unit for a dressing change about a week ago and then her symptoms started 5 days later.Patient describes the pain as crampy, worse across the lower right side of abdomen, subjective fevers, chills, nausea, dry heaves with decreased appetite and diarrhea.   Patient had PD cultures drawn at the outpatient unit and returned back positive for gram-negative rods on 12/13.  She received 1 dose of vancomycin and plan was to start ceftaz however pain became too severe when attempting dialysis, hence she came to ED instead. She reported that the catheter has been draining well although painful.  Poor p.o. intake in the last several days.  Patient was admitted for further workup  Assessment & Plan    Principal Problem: Sepsis secondary to acute peritonitis ([place]) in the setting of peritoneal dialysis catheter dysfunction Klebsiella pneumonia PD peritonitis -Presented with abdominal pain, febrile, tachypnea, leukopenia, source likely due to peritonitis.  Outpatient PD cultures+ Klebsiella pneumonia -Continue IV cefepime -Nephrology following  Active Problems:    ESRD on peritoneal dialysis ([place]), metabolic acidosis with increased AG -Patient reported malfunctioning of PD catheter.  -Abdomen distended, only received partial treatment with slow draining -Management per nephrology     Anemia due to chronic kidney disease -Hemoglobin 10.0 on  admission -H&H currently stable, 8.6 improving, 7.7 on 12/15    Essential hypertension -BP stable, continue amlodipine, furosemide, irbesartan, propranolol, Aldactone    Lupus (HCC) -Continue Plaquenil    GERD (gastroesophageal reflux disease) -CT abdomen incidentally showed a hiatal hernia with distal esophageal thickening, may indicate esophagitis, nonemergent upper endoscopy recommended -Continue PPI twice daily for 1 month then daily, outpatient follow-up with GI for endoscopy   Diarrhea -Unclear etiology, will check C. difficile, GI pathogen panel  Code Status: Full CODE STATUS DVT Prophylaxis:  heparin injection 5,000 Units Start: 10/16/22 1400 SCDs Start: 10/16/22 0437   Level of Care: Level of care: Telemetry Medical Family Communication: Updated patient, alert and oriented Disposition Plan:      Remains inpatient appropriate:    Procedures:  Peritoneal dialysis  Consultants:   Nephrology  Antimicrobials:   Anti-infectives (From admission, onward)    Start     Dose/Rate Route Frequency Ordered Stop   10/17/22 1000  ceFEPIme (MAXIPIME) 1 g in sodium chloride 0.9 % 100 mL IVPB        1 g 200 mL/hr over 30 Minutes Intravenous Every 24 hours 10/16/22 0954     10/17/22 1000  hydroxychloroquine (PLAQUENIL) tablet 200 mg        200 mg Oral Every other day 10/16/22 1256     10/16/22 0954  vancomycin variable dose per unstable renal function (pharmacist dosing)  Status:  Discontinued         Does not apply See admin instructions 10/16/22 0954 10/17/22 1448   10/16/22 0430  vancomycin (VANCOCIN) IVPB 1000 mg/200 mL premix        1,000 mg 200 mL/hr over 60 Minutes Intravenous  Once 10/16/22 0426 10/16/22 0631   10/16/22 0415  ceFEPIme (MAXIPIME) 2 g in sodium chloride 0.9 % 100 mL IVPB        2 g 200 mL/hr over 30 Minutes Intravenous  Once 10/16/22 0414 10/16/22 0631          Medications  amLODipine  10 mg Oral Daily   furosemide  80 mg Oral BID   gentamicin  cream  1 Application Topical Daily   heparin injection (subcutaneous)  5,000 Units Subcutaneous Q8H   hydroxychloroquine  200 mg Oral QODAY   irbesartan  300 mg Oral QPM   melatonin  3 mg Oral QHS   pantoprazole  40 mg Oral BID AC   propranolol  40 mg Oral QID   sodium chloride flush  3 mL Intravenous Q12H   spironolactone  25 mg Oral Daily      Subjective:   Janneth Krasner was seen and examined today.  States was uncomfortable and could not drain overnight, abdomen distended today.  No nausea vomiting, chest pain or shortness of breath.  No fevers.   Objective:   Vitals:   10/17/22 2114 10/18/22 0500 10/18/22 0546 10/18/22 0930  BP: 124/72  120/73 125/70  Pulse: 83  78 79  Resp: '18  18 17  '$ Temp: 98.9 F (37.2 C)  99.4 F (37.4 C) 98.6 F (37 C)  TempSrc: Oral  Oral Oral  SpO2: 96%  93% 97%  Weight: 52.2 kg 52.2 kg    Height:        Intake/Output Summary (Last 24 hours) at 10/18/2022 1236 Last data filed at 10/18/2022 0800 Gross per 24 hour  Intake 640 ml  Output -1460 ml  Net 2100 ml     Wt Readings from Last 3 Encounters:  10/18/22 52.2 kg  05/22/20 54 kg  05/21/20 53.9 kg   Physical Exam General: Alert and oriented x 3, uncomfortable Cardiovascular: S1 S2 clear, RRR.  Respiratory: CTAB, no wheezing, rales or rhonchi Gastrointestinal: Soft, distended, diffuse TTP, NBS Ext: no pedal edema bilaterally Psych: anxious    Data Reviewed:  I have personally reviewed following labs    CBC Lab Results  Component Value Date   WBC 5.4 10/18/2022   RBC 3.19 (L) 10/18/2022   HGB 8.6 (L) 10/18/2022   HCT 26.6 (L) 10/18/2022   MCV 83.4 10/18/2022   MCH 27.0 10/18/2022   PLT 391 10/18/2022   MCHC 32.3 10/18/2022   RDW 16.7 (H) 10/18/2022   LYMPHSABS 0.4 (L) 10/16/2022   MONOABS 0.2 10/16/2022   EOSABS 0.1 10/16/2022   BASOSABS 0.0 78/29/5621     Last metabolic panel Lab Results  Component Value Date   NA 132 (L) 10/18/2022   K 4.2 10/18/2022   CL  95 (L) 10/18/2022   CO2 15 (L) 10/18/2022   BUN 97 (H) 10/18/2022   CREATININE 15.19 (H) 10/18/2022   GLUCOSE 107 (H) 10/18/2022   GFRNONAA 2 (L) 10/18/2022   GFRAA 5 (L) 05/21/2020  CALCIUM 6.9 (L) 10/18/2022   PROT 6.8 10/16/2022   ALBUMIN 2.0 (L) 10/16/2022   LABGLOB 3.0 05/21/2020   AGRATIO 1.1 (L) 05/21/2020   BILITOT 2.3 (H) 10/16/2022   ALKPHOS 48 10/16/2022   AST 15 10/16/2022   ALT 10 10/16/2022   ANIONGAP 22 (H) 10/18/2022    CBG (last 3)  No results for input(s): "GLUCAP" in the last 72 hours.    Coagulation Profile: Recent Labs  Lab 10/16/22 0530  INR 1.3*     Radiology Studies: I have personally reviewed the imaging studies  DG Abd 1 View  Result Date: 10/16/2022 CLINICAL DATA:  End-stage renal disease. Assess peritoneal dialysis catheter placement. EXAM: ABDOMEN - 1 VIEW COMPARISON:  CT earlier today. FINDINGS: Peritoneal dialysis catheter enters the left pelvis, courses into the right pelvis and is looped in the right pelvis. No discontinuity or kink. Midline skin staples. Air-fluid levels throughout bowel loops in the central abdomen. Enteric sutures in the left abdomen. The small foci of free air on CT are not well delineated on the current exam IMPRESSION: 1. Peritoneal dialysis catheter looped in the right pelvis. No discontinuity or kink. 2. Air-fluid levels throughout bowel loops in the central abdomen, may represent ileus. Electronically Signed   By: Keith Rake M.D.   On: 10/16/2022 18:41       Kacelyn Rowzee M.D. [facility] 10/18/2022, 12:36 PM  Available via Epic secure chat 7am-7pm After 7 pm, please refer to night coverage provider listed on amion.

## 2022-10-18 NOTE — Progress Notes (Signed)
Admit: 10/15/2022 LOS: 2  70F ESRD on PD with peritonitis  Subjective:  PD cath slow drain alarms overnight, only rec partial treatment Still has fluid in abdomen Gram stain here with GNRs, no fungus/yeast reported Cell counts as below, two results given, 1800 and 330?? PD Cx growing K pneumoniae as outpt  12/15 0701 - 12/16 0700 In: 440 [P.O.:440] Out: -1460   Filed Weights   10/17/22 1908 10/17/22 2114 10/18/22 0500  Weight: 52.2 kg 52.2 kg 52.2 kg    Scheduled Meds:  amLODipine  10 mg Oral Daily   furosemide  80 mg Oral BID   gentamicin cream  1 Application Topical Daily   heparin injection (subcutaneous)  5,000 Units Subcutaneous Q8H   hydroxychloroquine  200 mg Oral QODAY   irbesartan  300 mg Oral QPM   melatonin  3 mg Oral QHS   pantoprazole  40 mg Oral BID AC   propranolol  40 mg Oral QID   sodium chloride flush  3 mL Intravenous Q12H   spironolactone  25 mg Oral Daily   Continuous Infusions:  ceFEPime (MAXIPIME) IV 1 g (10/18/22 0944)   dialysis solution 1.5% low-MG/low-CA     dialysis solution 2.5% low-MG/low-CA     dialysis solution 2.5% low-MG/low-CA     PRN Meds:.acetaminophen **OR** acetaminophen, albuterol, HYDROmorphone (DILAUDID) injection, ondansetron (ZOFRAN) IV, traMADol  Current Labs: reviewed   Called outpt [facility] 12/15: Outpt PD Fluid Cx: GNRs -- klebsiella pneumoniae Outpt PD Cell Count: 2751 TNCs 10/14/22   Latest Reference Range & Units 10/18/22 03:47  Total Nucleated Cell Count, Fluid 0 - 1,000 cu mm 0 - 1,000 cu mm 1,836 (H) 332  (H): Data is abnormally high    Physical Exam:  Blood pressure 125/70, pulse 79, temperature 98.6 F (37 C), temperature source Oral, resp. rate 17, height '5\' 2"'$  (1.575 m), weight 52.2 kg, SpO2 97 %. NAD wa/wd; appears more uncomfortable RRR CTAB Mild TTP diffusely, more distended PD Exit site pristine No LEE Nonfocal NCAT EOMI  CCPD Rx 1.8L x 5, all 1.5%, dry day  A Klebsiella  pneumoniae PD Peritonitis; clincally improving PD cath malfunction, sluggish outflow likely 2/2 fibrin ESRD on PD HTN on CCB, MRB, ARB as outpt Hx/o SLE Anemia, Hb 7s  P cefepime pending sensitivities Resume PD earlier today, start with a drain; heparinize all bags Hopefully will rec ABX sensitivity data over the weekend Rpt cell counts again tomorrow Once tolerating PO and clinically stable should be ok for DC and outpt mgmt with her home therapies group -- not there yet Trend Hb Medication Issues; Preferred narcotic agents for pain control are hydromorphone, fentanyl, and methadone. Morphine should not be used.  Baclofen should be avoided Avoid oral sodium phosphate and magnesium citrate based laxatives / bowel preps    Pearson Grippe MD 10/18/2022, 11:28 AM  Recent Labs  Lab 10/16/22 0031 10/18/22 0440  NA 133* 132*  K 3.6 4.2  CL 96* 95*  CO2 19* 15*  GLUCOSE 122* 107*  BUN 75* 97*  CREATININE 13.01* 15.19*  CALCIUM 7.5* 6.9*    Recent Labs  Lab 10/16/22 0031 10/17/22 0605 10/17/22 1253 10/18/22 0440  WBC 2.7* 7.3  --  5.4  NEUTROABS 2.0  --   --   --   HGB 10.0* 7.6* 7.7* 8.6*  HCT 32.3* 24.1* 24.5* 26.6*  MCV 87.3 87.0  --  83.4  PLT 381 293  --  391

## 2022-10-19 DIAGNOSIS — N186 End stage renal disease: Secondary | ICD-10-CM | POA: Diagnosis not present

## 2022-10-19 DIAGNOSIS — N185 Chronic kidney disease, stage 5: Secondary | ICD-10-CM | POA: Diagnosis not present

## 2022-10-19 DIAGNOSIS — A419 Sepsis, unspecified organism: Secondary | ICD-10-CM | POA: Diagnosis not present

## 2022-10-19 DIAGNOSIS — K650 Generalized (acute) peritonitis: Secondary | ICD-10-CM | POA: Diagnosis not present

## 2022-10-19 LAB — CBC
HCT: 25.8 % — ABNORMAL LOW (ref 36.0–46.0)
Hemoglobin: 8.4 g/dL — ABNORMAL LOW (ref 12.0–15.0)
MCH: 27.1 pg (ref 26.0–34.0)
MCHC: 32.6 g/dL (ref 30.0–36.0)
MCV: 83.2 fL (ref 80.0–100.0)
Platelets: 407 10*3/uL — ABNORMAL HIGH (ref 150–400)
RBC: 3.1 MIL/uL — ABNORMAL LOW (ref 3.87–5.11)
RDW: 16.8 % — ABNORMAL HIGH (ref 11.5–15.5)
WBC: 8.1 10*3/uL (ref 4.0–10.5)
nRBC: 0.6 % — ABNORMAL HIGH (ref 0.0–0.2)

## 2022-10-19 LAB — BASIC METABOLIC PANEL
Anion gap: 19 — ABNORMAL HIGH (ref 5–15)
BUN: 100 mg/dL — ABNORMAL HIGH (ref 8–23)
CO2: 17 mmol/L — ABNORMAL LOW (ref 22–32)
Calcium: 6.9 mg/dL — ABNORMAL LOW (ref 8.9–10.3)
Chloride: 95 mmol/L — ABNORMAL LOW (ref 98–111)
Creatinine, Ser: 15.19 mg/dL — ABNORMAL HIGH (ref 0.44–1.00)
GFR, Estimated: 2 mL/min — ABNORMAL LOW (ref >60)
Glucose, Bld: 113 mg/dL — ABNORMAL HIGH (ref 70–99)
Potassium: 4.2 mmol/L (ref 3.5–5.1)
Sodium: 131 mmol/L — ABNORMAL LOW (ref 135–145)

## 2022-10-19 LAB — HEPATITIS B SURFACE ANTIGEN: Hepatitis B Surface Ag: NONREACTIVE

## 2022-10-19 MED ORDER — DELFLEX-LC/1.5% DEXTROSE 344 MOSM/L IP SOLN: 500.0000 mL | INTRAPERITONEAL | Status: DC | PRN

## 2022-10-19 MED ORDER — HEPARIN SODIUM (PORCINE) 1000 UNIT/ML IJ SOLN
INTRAPERITONEAL | Status: DC
  Filled 2022-10-19 (×4): qty 6000

## 2022-10-19 MED ORDER — ALTEPLASE 2 MG IJ SOLR
2.0000 mg | INTRAMUSCULAR | Status: AC | PRN
  Administered 2022-10-19 – 2022-10-22 (×2): 2 mg
  Filled 2022-10-19 (×2): qty 2

## 2022-10-19 MED ORDER — HEPARIN SODIUM (PORCINE) 5000 UNIT/ML IJ SOLN
5000.0000 [IU] | Freq: Three times a day (TID) | INTRAMUSCULAR | Status: DC
  Administered 2022-10-20 – 2022-11-06 (×41): 5000 [IU] via SUBCUTANEOUS
  Filled 2022-10-19 (×44): qty 1

## 2022-10-19 NOTE — Progress Notes (Signed)
[facility]                                                                              [name], is a 71 y.o. female, DOB - 16-Jan-1951, [id number] Admit date - 10/15/2022    Outpatient Primary MD for the patient is [name], [name], MD  LOS - 3  days  Chief Complaint  Patient presents with   Abdominal Pain       Brief summary   Patient is a 71 year old female with HTN, ESRD on PD, lupus presented with abdominal pain for last 5 days.  She reported that a piece of her PD catheter came loose when she was at the dialysis unit for a dressing change about a week ago and then her symptoms started 5 days later.Patient describes the pain as crampy, worse across the lower right side of abdomen, subjective fevers, chills, nausea, dry heaves with decreased appetite and diarrhea.   Patient had PD cultures drawn at the outpatient unit and returned back positive for gram-negative rods on 12/13.  She received 1 dose of vancomycin and plan was to start ceftaz however pain became too severe when attempting dialysis, hence she came to ED instead. She reported that the catheter has been draining well although painful.  Poor p.o. intake in the last several days.  Patient was admitted for further workup  Assessment & Plan    Principal Problem: Sepsis secondary to acute peritonitis ([place]) in the setting of peritoneal dialysis catheter dysfunction Klebsiella pneumonia PD peritonitis -Presented with abdominal pain, febrile, tachypnea, leukopenia, source likely due to peritonitis.   - PD cultures+ Klebsiella pneumonia -Continue IV cefepime, nephrology following   Active Problems:    ESRD on peritoneal dialysis ([place]), metabolic acidosis with increased AG -PD Catheter malfunctioning, plan for transition to HD  -Per renal, plan for [facility] with HD tomorrow     Anemia due to chronic kidney disease -Hemoglobin 10.0 on admission -H&H stable 8.4    Essential hypertension -BP  stable, continue amlodipine, furosemide, irbesartan, propranolol, Aldactone    Lupus (HCC) -Continue Plaquenil    GERD (gastroesophageal reflux disease) -CT abdomen incidentally showed a hiatal hernia with distal esophageal thickening, may indicate esophagitis, nonemergent upper endoscopy recommended -Continue PPI twice daily for 1 month then daily, outpatient f/u with GI for endoscopy    Code Status: Full CODE STATUS DVT Prophylaxis:  heparin injection 5,000 Units Start: 10/20/22 2200 SCDs Start: 10/16/22 0437   Level of Care: Level of care: Telemetry Medical Family Communication: Updated patient, alert and oriented Disposition Plan:      Remains inpatient appropriate:    Procedures:  Peritoneal dialysis  Consultants:   Nephrology  Antimicrobials:   Anti-infectives (From admission, onward)    Start     Dose/Rate Route Frequency Ordered Stop   10/17/22 1000  ceFEPIme (MAXIPIME) 1 g in sodium chloride 0.9 % 100 mL IVPB        1 g 200 mL/hr over 30 Minutes Intravenous Every 24 hours 10/16/22 0954     10/17/22 1000  hydroxychloroquine (PLAQUENIL) tablet 200 mg        200 mg Oral  Every other day 10/16/22 1256     10/16/22 0954  vancomycin variable dose per unstable renal function (pharmacist dosing)  Status:  Discontinued         Does not apply See admin instructions 10/16/22 0954 10/17/22 1448   10/16/22 0430  vancomycin (VANCOCIN) IVPB 1000 mg/200 mL premix        1,000 mg 200 mL/hr over 60 Minutes Intravenous  Once 10/16/22 0426 10/16/22 0631   10/16/22 0415  ceFEPIme (MAXIPIME) 2 g in sodium chloride 0.9 % 100 mL IVPB        2 g 200 mL/hr over 30 Minutes Intravenous  Once 10/16/22 0414 10/16/22 0631          Medications  amLODipine  10 mg Oral Daily   furosemide  80 mg Oral BID   gentamicin cream  1 Application Topical Daily   [START ON 10/20/2022] heparin injection (subcutaneous)  5,000 Units Subcutaneous Q8H   heparin sodium (porcine) 3,000 Units in dialysis  solution 1.5% low-MG/low-CA 6,000 mL dialysis solution   Peritoneal Dialysis Q24H   heparin sodium (porcine) 3,000 Units in dialysis solution 2.5% low-MG/low-CA 6,000 mL dialysis solution   Peritoneal Dialysis Q24H   hydroxychloroquine  200 mg Oral QODAY   irbesartan  300 mg Oral QPM   melatonin  3 mg Oral QHS   pantoprazole  40 mg Oral BID AC   propranolol  40 mg Oral QID   sodium chloride flush  3 mL Intravenous Q12H   spironolactone  25 mg Oral Daily      Subjective:   Sheena Smith was seen and examined today.  Abdominal pain and uncomfortable with PD catheter not draining no chest pain, fevers.  No acute nausea or vomiting.  Objective:   Vitals:   10/18/22 2126 10/19/22 0500 10/19/22 0537 10/19/22 1015  BP: 127/73  125/76 127/71  Pulse: 86  88 83  Resp: '17  17 19  '$ Temp: 99.3 F (37.4 C)  98.9 F (37.2 C) 98.5 F (36.9 C)  TempSrc: Oral  Oral   SpO2: 95%  96% 97%  Weight: 57.8 kg 57.8 kg    Height:        Intake/Output Summary (Last 24 hours) at 10/19/2022 1405 Last data filed at 10/19/2022 0900 Gross per 24 hour  Intake 540 ml  Output 1 ml  Net 539 ml     Wt Readings from Last 3 Encounters:  10/19/22 57.8 kg  05/22/20 54 kg  05/21/20 53.9 kg    Physical Exam General: Alert and oriented x 3 uncomfortable Cardiovascular: S1 S2 clear, RRR.  Respiratory: CTAB Gastrointestinal: Soft, mild diffuse TTP, distended, PD cath+ Ext: no pedal edema bilaterally Neuro: no new deficits Psych: anxious    Data Reviewed:  I have personally reviewed following labs    CBC Lab Results  Component Value Date   WBC 8.1 10/19/2022   RBC 3.10 (L) 10/19/2022   HGB 8.4 (L) 10/19/2022   HCT 25.8 (L) 10/19/2022   MCV 83.2 10/19/2022   MCH 27.1 10/19/2022   PLT 407 (H) 10/19/2022   MCHC 32.6 10/19/2022   RDW 16.8 (H) 10/19/2022   LYMPHSABS 0.4 (L) 10/16/2022   MONOABS 0.2 10/16/2022   EOSABS 0.1 10/16/2022   BASOSABS 0.0 63/78/5885     Last metabolic panel Lab  Results  Component Value Date   NA 131 (L) 10/19/2022   K 4.2 10/19/2022   CL 95 (L) 10/19/2022   CO2 17 (L) 10/19/2022   BUN 100 (  H) 10/19/2022   CREATININE 15.19 (H) 10/19/2022   GLUCOSE 113 (H) 10/19/2022   GFRNONAA 2 (L) 10/19/2022   GFRAA 5 (L) 05/21/2020   CALCIUM 6.9 (L) 10/19/2022   PROT 6.8 10/16/2022   ALBUMIN 2.0 (L) 10/16/2022   LABGLOB 3.0 05/21/2020   AGRATIO 1.1 (L) 05/21/2020   BILITOT 2.3 (H) 10/16/2022   ALKPHOS 48 10/16/2022   AST 15 10/16/2022   ALT 10 10/16/2022   ANIONGAP 19 (H) 10/19/2022    CBG (last 3)  No results for input(s): "GLUCAP" in the last 72 hours.    Coagulation Profile: Recent Labs  Lab 10/16/22 0530  INR 1.3*     Radiology Studies: I have personally reviewed the imaging studies  No results found.     Estill Cotta M.D. [facility] 10/19/2022, 2:05 PM  Available via Epic secure chat 7am-7pm After 7 pm, please refer to night coverage provider listed on amion.

## 2022-10-19 NOTE — Procedures (Signed)
PT did not receive TD due to possible fibrin blockage. Received 1st cycle dwell and no drainage. PT states stomach pain. Two cultures drawn yesterday and PT states needing minor surgery due to lack of drainage before at the onset of her PD tx. On call Dr. Sadie Haber  notified.

## 2022-10-19 NOTE — Procedures (Signed)
PD note:  Cath Flow was placed to dwell in patient's PD catheter.  Patient and daughter were told that it would stay in until tomorrow. Patient stated she was in no pain with the instillation of the fluid. Dressing dry and intact.  Catheter secured to dressing with a 4x4 and tape to avoid it dangling.

## 2022-10-19 NOTE — Progress Notes (Signed)
Admit: 10/15/2022 LOS: 3  34F ESRD on PD with peritonitis  Subjective:  PD cath couldn't drain yesterday, has not cycled Abd hurts, distended BCx NGTD, PD Cx pending; outpt was K pneumoniae Remains on cefepime  12/16 0701 - 12/17 0700 In: 740 [P.O.:640; IV Piggyback:100] Out: 0   Filed Weights   10/18/22 0500 10/18/22 2126 10/19/22 0500  Weight: 52.2 kg 57.8 kg 57.8 kg    Scheduled Meds:  amLODipine  10 mg Oral Daily   furosemide  80 mg Oral BID   gentamicin cream  1 Application Topical Daily   [START ON 10/20/2022] heparin injection (subcutaneous)  5,000 Units Subcutaneous Q8H   heparin sodium (porcine) 3,000 Units in dialysis solution 1.5% low-MG/low-CA 6,000 mL dialysis solution   Peritoneal Dialysis Q24H   heparin sodium (porcine) 3,000 Units in dialysis solution 2.5% low-MG/low-CA 6,000 mL dialysis solution   Peritoneal Dialysis Q24H   hydroxychloroquine  200 mg Oral QODAY   irbesartan  300 mg Oral QPM   melatonin  3 mg Oral QHS   pantoprazole  40 mg Oral BID AC   propranolol  40 mg Oral QID   sodium chloride flush  3 mL Intravenous Q12H   spironolactone  25 mg Oral Daily   Continuous Infusions:  ceFEPime (MAXIPIME) IV 1 g (10/19/22 1039)   PRN Meds:.acetaminophen **OR** acetaminophen, albuterol, alum & mag hydroxide-simeth, HYDROmorphone (DILAUDID) injection, ondansetron (ZOFRAN) IV, traMADol  Current Labs: reviewed   Called outpt [facility] [place] [facility] 12/15: Outpt PD Fluid Cx: GNRs -- klebsiella pneumoniae Outpt PD Cell Count: 2751 TNCs 10/14/22   Latest Reference Range & Units 10/18/22 03:47  Total Nucleated Cell Count, Fluid 0 - 1,000 cu mm 0 - 1,000 cu mm 1,836 (H) 332  (H): Data is abnormally high    Physical Exam:  Blood pressure 127/71, pulse 83, temperature 98.5 F (36.9 C), resp. rate 19, height '5\' 2"'$  (1.575 m), weight 57.8 kg, SpO2 97 %. NAD wa/wd; appears more uncomfortable RRR CTAB Mild TTP diffusely, more distended PD Exit site pristine No  LEE Nonfocal NCAT EOMI  CCPD Rx 1.8L x 5, all 1.5%, dry day  A Klebsiella pneumoniae PD Peritonitis; clincally improving PD cath malfunction, sluggish outflow likely 2/2 fibrin ESRD on PD HTN on CCB, MRB, ARB as outpt Hx/o SLE Anemia, Hb 8s stable  P cefepime pending sensitivities Losing confidence in this PD catheter Plan to transition to iHD, consult IR for [facility], HD tomorrow WIll ask KDU RN to place tPA in PD cath today, then tonight attempt to drain after several hour dwell Awaiting ABX sensitivity data over the weekend, probably need to call [facility] Monday Rpt cell counts in PD fluid if patency established Trend Hb Medication Issues; Preferred narcotic agents for pain control are hydromorphone, fentanyl, and methadone. Morphine should not be used.  Baclofen should be avoided Avoid oral sodium phosphate and magnesium citrate based laxatives / bowel preps    Pearson Grippe MD 10/19/2022, 11:41 AM  Recent Labs  Lab 10/16/22 0031 10/18/22 0440 10/19/22 0316  NA 133* 132* 131*  K 3.6 4.2 4.2  CL 96* 95* 95*  CO2 19* 15* 17*  GLUCOSE 122* 107* 113*  BUN 75* 97* 100*  CREATININE 13.01* 15.19* 15.19*  CALCIUM 7.5* 6.9* 6.9*    Recent Labs  Lab 10/16/22 0031 10/17/22 0605 10/17/22 1253 10/18/22 0440 10/19/22 0316  WBC 2.7* 7.3  --  5.4 8.1  NEUTROABS 2.0  --   --   --   --  HGB 10.0* 7.6* 7.7* 8.6* 8.4*  HCT 32.3* 24.1* 24.5* 26.6* 25.8*  MCV 87.3 87.0  --  83.4 83.2  PLT 381 293  --  391 407*

## 2022-10-19 NOTE — Consult Note (Signed)
Chief Complaint: Patient was seen in consultation today for tunneled hemodialysis catheter Chief Complaint  Patient presents with   Abdominal Pain    Referring Physician(s): Sanford,R  Supervising Physician: Michaelle Birks  Patient Status: [facility] - In-pt  History of Present Illness: Sheena Smith is a 71 y.o. female with HTN, ESRD on PD, GERD, anemia, lupus who recently presented with abdominal pain for last 5 days.  She reported that a piece of her PD catheter came loose when she was at the dialysis unit for a dressing change about a week ago and then her symptoms started 5 days later.Patient describes the pain as crampy, worse across the lower right side of abdomen, subjective fevers, chills, nausea, dry heaves with decreased appetite and diarrhea. Patient had PD cultures drawn at the outpatient unit and returned back positive for gram-negative rods on 12/13. She received 1 dose of vancomycin and plan was to start ceftaz ,however pain became too severe when attempting dialysis, hence she came to ED instead; has been on antbx now for klebsiella PD peritonitis; latest blood cx neg to date; she is afebrile; WBC nl; with recent peritonitis and PD cath malfunction request now received from neph for tunneled HD cath  Past Medical History:  Diagnosis Date   Hypertension    Lupus ([place])    Renal failure     Past Surgical History:  Procedure Laterality Date   FOOT SURGERY     SMALL INTESTINE SURGERY      Allergies: Nsaids, Other, Atenolol, and Elemental sulfur  Medications: Prior to Admission medications   Medication Sig Start Date End Date Taking? Authorizing Provider  amLODipine (NORVASC) 10 MG tablet Take 1 tablet by mouth daily. 11/07/19 10/16/22 Yes [provider]  ferrous sulfate 324 MG TBEC Take 324 mg by mouth daily with breakfast.   Yes [provider]  furosemide (LASIX) 80 MG tablet Take 80 mg by mouth 2 (two) times daily.   Yes [provider]   hydroxychloroquine (PLAQUENIL) 200 MG tablet Take 200 mg by mouth every other day.   Yes [provider]  irbesartan (AVAPRO) 300 MG tablet Take 300 mg by mouth every evening.   Yes [provider]  pantoprazole (PROTONIX) 40 MG tablet Take 40 mg by mouth daily.   Yes [provider]  propranolol (INDERAL) 20 MG tablet Take 40 mg by mouth 4 (four) times daily. 02/14/19 10/16/22 Yes [provider]  sodium bicarbonate 650 MG tablet Take 650 mg by mouth once as needed for heartburn. 09/26/19 09/26/23 Yes [provider]  spironolactone (ALDACTONE) 25 MG tablet Take 25 mg by mouth daily.   Yes [provider]     Family History  Problem Relation Age of Onset   Ovarian cancer Mother    Hypertension Father    Diabetes Father     Social History   Socioeconomic History   Marital status: Widowed    Spouse name: Not on file   Number of children: Not on file   Years of education: Not on file   Highest education level: Not on file  Occupational History   Not on file  Tobacco Use   Smoking status: Former    Years: 2.00    Types: Cigarettes   Smokeless tobacco: Never  Substance and Sexual Activity   Alcohol use: Not Currently   Drug use: Not Currently   Sexual activity: Not on file  Other Topics Concern   Not on file  Social History  Narrative   Not on file   Social Determinants of Health   Financial Resource Strain: Not on file  Food Insecurity: No Food Insecurity (10/17/2022)   Hunger Vital Sign    Worried About Running Out of Food in the Last Year: Never true    Ran Out of Food in the Last Year: Never true  Transportation Needs: No Transportation Needs (10/17/2022)   PRAPARE - Hydrologist (Medical): No    Lack of Transportation (Non-Medical): No  Physical Activity: Not on file  Stress: Not on file  Social Connections: Not on file      Review of Systems currently denies fever, headache,  chest pain, dyspnea, cough, back pain, nausea, vomiting or bleeding.  She does have abdominal discomfort and some recent loose stools  Vital Signs: BP 125/76 (BP Location: Right Arm)   Pulse 88   Temp 98.9 F (37.2 C) (Oral)   Resp 17   Ht '5\' 2"'$  (1.575 m)   Wt 127 lb 6.8 oz (57.8 kg)   SpO2 96%   BMI 23.31 kg/m      Physical Exam, awake/alert.  Chest with slightly diminished breath sounds bases.  Heart with regular rate and rhythm.  Abdomen  distended, few bowel sounds, some diffuse tenderness to palpation, PD catheter in place.  No significant lower extremity edema.  Imaging: DG Abd 1 View  Result Date: 10/16/2022 CLINICAL DATA:  End-stage renal disease. Assess peritoneal dialysis catheter placement. EXAM: ABDOMEN - 1 VIEW COMPARISON:  CT earlier today. FINDINGS: Peritoneal dialysis catheter enters the left pelvis, courses into the right pelvis and is looped in the right pelvis. No discontinuity or kink. Midline skin staples. Air-fluid levels throughout bowel loops in the central abdomen. Enteric sutures in the left abdomen. The small foci of free air on CT are not well delineated on the current exam IMPRESSION: 1. Peritoneal dialysis catheter looped in the right pelvis. No discontinuity or kink. 2. Air-fluid levels throughout bowel loops in the central abdomen, may represent ileus. Electronically Signed   By: Keith Rake M.D.   On: 10/16/2022 18:41   CT ABDOMEN PELVIS WO CONTRAST  Result Date: 10/16/2022 CLINICAL DATA:  Acute abdominal pain EXAM: CT ABDOMEN AND PELVIS WITHOUT CONTRAST TECHNIQUE: Multidetector CT imaging of the abdomen and pelvis was performed following the standard protocol without IV contrast. RADIATION DOSE REDUCTION: This exam was performed according to the departmental dose-optimization program which includes automated exposure control, adjustment of the mA and/or kV according to patient size and/or use of iterative reconstruction technique. COMPARISON:  None  Available. FINDINGS: Lower Chest: Small pleural effusions Hepatobiliary: Normal hepatic contours. No intra- or extrahepatic biliary dilatation. Small volume perihepatic ascites. Normal gallbladder. Pancreas: Normal pancreas. No ductal dilatation or peripancreatic fluid collection. Spleen: Normal. Adrenals/Urinary Tract: The adrenal glands are normal. Bilateral renal atrophy. There is a left paramedian approach peritoneal dialysis catheter that is coiled in the right lower quadrant. Intermediate volume free fluid in the pelvis. There are a few expected punctate foci of intra-abdominal free air. Stomach/Bowel: Hiatal hernia with distal esophageal thickening. No small bowel dilatation or inflammation. No focal colonic abnormality. Vascular/Lymphatic: There is calcific atherosclerosis of the abdominal aorta. No lymphadenopathy. Reproductive: Fibroid uterus Other: None. Musculoskeletal: No bony spinal canal stenosis or focal osseous abnormality. IMPRESSION: 1. No acute abnormality of the abdomen or pelvis. 2. Peritoneal dialysis catheter coiled in the right lower quadrant with intermediate volume free fluid in the pelvis. 3. Small pleural effusions. 4.  Hiatal hernia with distal esophageal thickening, which may indicate esophagitis. Correlation with non-emergent upper endoscopy recommended to exclude neoplasm. Aortic atherosclerosis (ICD10-I70.0). Electronically Signed   By: Ulyses Jarred M.D.   On: 10/16/2022 01:15    Labs:  CBC: Recent Labs    10/16/22 0031 10/17/22 0605 10/17/22 1253 10/18/22 0440 10/19/22 0316  WBC 2.7* 7.3  --  5.4 8.1  HGB 10.0* 7.6* 7.7* 8.6* 8.4*  HCT 32.3* 24.1* 24.5* 26.6* 25.8*  PLT 381 293  --  391 407*    COAGS: Recent Labs    10/16/22 0530  INR 1.3*  APTT 39*    BMP: Recent Labs    10/16/22 0031 10/18/22 0440 10/19/22 0316  NA 133* 132* 131*  K 3.6 4.2 4.2  CL 96* 95* 95*  CO2 19* 15* 17*  GLUCOSE 122* 107* 113*  BUN 75* 97* 100*  CALCIUM 7.5* 6.9* 6.9*   CREATININE 13.01* 15.19* 15.19*  GFRNONAA 3* 2* 2*    LIVER FUNCTION TESTS: Recent Labs    10/16/22 0031  BILITOT 2.3*  AST 15  ALT 10  ALKPHOS 48  PROT 6.8  ALBUMIN 2.0*    TUMOR MARKERS: No results for input(s): "AFPTM", "CEA", "CA199", "CHROMGRNA" in the last 8760 hours.  Assessment and Plan: 71 y.o. female with HTN, ESRD on PD, GERD, anemia, lupus who recently presented with abdominal pain for last 5 days.  She reported that a piece of her PD catheter came loose when she was at the dialysis unit for a dressing change about a week ago and then her symptoms started 5 days later.Patient describes the pain as crampy, worse across the lower right side of abdomen, subjective fevers, chills, nausea, dry heaves with decreased appetite and diarrhea. Patient had PD cultures drawn at the outpatient unit and returned back positive for gram-negative rods on 12/13. She received 1 dose of vancomycin and plan was to start ceftaz ,however pain became too severe when attempting dialysis, hence she came to ED instead; has been on antbx now for klebsiella PD peritonitis; latest blood cx neg to date; she is afebrile; WBC nl; with recent peritonitis and PD cath malfunction request now received from neph for tunneled HD cath.  Details/risks of procedure, including but not limited to, internal bleeding, infection, injury to adjacent structures discussed with patient with her understanding and consent.  Procedure tentatively scheduled for 12/18.   Thank you for this interesting consult.  I greatly enjoyed meeting Sheena Smith and look forward to participating in their care.  A copy of this report was sent to the requesting provider on this date.  Electronically Signed: D. Rowe Robert, PA-C 10/19/2022, 9:04 AM   I spent a total of  25 minutes   in face to face in clinical consultation, greater than 50% of which was counseling/coordinating care for tunneled hemodialysis catheter

## 2022-10-20 ENCOUNTER — Inpatient Hospital Stay (HOSPITAL_COMMUNITY): Payer: Medicare Other

## 2022-10-20 DIAGNOSIS — K650 Generalized (acute) peritonitis: Secondary | ICD-10-CM | POA: Diagnosis not present

## 2022-10-20 DIAGNOSIS — I10 Essential (primary) hypertension: Secondary | ICD-10-CM | POA: Diagnosis not present

## 2022-10-20 DIAGNOSIS — E872 Acidosis, unspecified: Secondary | ICD-10-CM | POA: Diagnosis not present

## 2022-10-20 DIAGNOSIS — N186 End stage renal disease: Secondary | ICD-10-CM | POA: Diagnosis not present

## 2022-10-20 HISTORY — PX: IR US GUIDE VASC ACCESS RIGHT: IMG2390

## 2022-10-20 HISTORY — PX: IR FLUORO GUIDE CV LINE RIGHT: IMG2283

## 2022-10-20 LAB — CBC
HCT: 25 % — ABNORMAL LOW (ref 36.0–46.0)
Hemoglobin: 8.1 g/dL — ABNORMAL LOW (ref 12.0–15.0)
MCH: 26.9 pg (ref 26.0–34.0)
MCHC: 32.4 g/dL (ref 30.0–36.0)
MCV: 83.1 fL (ref 80.0–100.0)
Platelets: 422 10*3/uL — ABNORMAL HIGH (ref 150–400)
RBC: 3.01 MIL/uL — ABNORMAL LOW (ref 3.87–5.11)
RDW: 17 % — ABNORMAL HIGH (ref 11.5–15.5)
WBC: 15.9 10*3/uL — ABNORMAL HIGH (ref 4.0–10.5)
nRBC: 0.3 % — ABNORMAL HIGH (ref 0.0–0.2)

## 2022-10-20 LAB — BODY FLUID CULTURE W GRAM STAIN

## 2022-10-20 MED ORDER — CHLORHEXIDINE GLUCONATE CLOTH 2 % EX PADS
6.0000 | MEDICATED_PAD | Freq: Every day | CUTANEOUS | Status: DC
  Administered 2022-10-20 – 2022-10-31 (×9): 6 via TOPICAL

## 2022-10-20 MED ORDER — ANTICOAGULANT SODIUM CITRATE 4% (200MG/5ML) IV SOLN: 5.0000 mL | Status: DC | PRN

## 2022-10-20 MED ORDER — ALTEPLASE 2 MG IJ SOLR: 2.0000 mg | Freq: Once | INTRAMUSCULAR | Status: DC | PRN

## 2022-10-20 MED ORDER — IOHEXOL 9 MG/ML PO SOLN
500.0000 mL | ORAL | Status: AC
  Administered 2022-10-20 (×2): 500 mL via ORAL

## 2022-10-20 MED ORDER — LIDOCAINE HCL 1 % IJ SOLN
INTRAMUSCULAR | Status: AC
  Administered 2022-10-20: 10 mL
  Filled 2022-10-20: qty 20

## 2022-10-20 MED ORDER — HEPARIN SODIUM (PORCINE) 1000 UNIT/ML DIALYSIS
1000.0000 [IU] | INTRAMUSCULAR | Status: DC | PRN
  Filled 2022-10-20: qty 1

## 2022-10-20 MED ORDER — HEPARIN SODIUM (PORCINE) 1000 UNIT/ML IJ SOLN
INTRAMUSCULAR | Status: AC
  Filled 2022-10-20: qty 10

## 2022-10-20 MED ORDER — IOHEXOL 300 MG/ML  SOLN: 100.0000 mL | Freq: Once | INTRAMUSCULAR | Status: DC | PRN

## 2022-10-20 NOTE — Procedures (Signed)
Vascular and Interventional Radiology Procedure Note  Patient: Sheena Smith DOB: 11-Sep-1951 Medical Record Number: [id number] Note Date/Time: 10/20/22 2:08 PM   Performing Physician: Michaelle Birks, MD Assistant(s): None  Diagnosis: ESRD requiring Hemodialysis  Procedure: NON-TUNNELED HEMODIALYSIS CATHETER PLACEMENT  Anesthesia: Local Anesthetic Complications: None Estimated Blood Loss: Minimal Specimens:  None  Findings:  Successful placement of right-sided,  16 cm  NON-tunneled hemodialysis catheter with the tip of the catheter in the proximal right atrium.  Plan: Catheter ready for use.  See detailed procedure note with images in PACS. The patient tolerated the procedure well without incident or complication and was returned to Floor Bed in stable condition.    Michaelle Birks, MD [facility] Radiology   Pager. [place]

## 2022-10-20 NOTE — Progress Notes (Signed)
PROGRESS NOTE    Sheena Smith  YPP:[id number] DOB: 1951-10-31 DOA: 10/15/2022 PCP: [name], MD   Brief Narrative: Sheena Smith is a 71 y.o. female with a history of hypertension, ESRD on PD and lupus. Patient presented secondary to abdominal pain and was found to have evidence of acute peritonitis. Patient managed with IV antibiotics and transitioned to hemodialysis.   Assessment and Plan:  Klebsiella pneumoniae peritonitis In setting of peritoneal dialysis history. Patient started on empiric Vancomycin/Cefepime. Body fluid culture significant for ampicillin resistant klebsiella pneumoniae infection. Blood cultures negative. Vancomycin discontinued. -Continue Cefepime  ESRD on peritoneal dialysis Nephrology consulted. Patient transitioning to hemodialysis. IR consulted for dialysis catheter placement.  Leukocytosis Unclear reason for significant elevation. Patient previously leukopenic. No new symptoms to suggest new infection. -CBC in AM  Metabolic acidosis with increased anion gap Secondary to renal failure.  Anemia of chronic kidney disease Hemoglobin stable.  Primary hypertension -Continue amlodipine, irbesartan, spironolactone and lasix  Lupus -Continue Plaquenil  GERD CT abdomen/pelvis significant for distal esophageal thickening which may be consistent with esophagitis. Recommendation for upper endoscopy. Patient appears to be asymptomatic.   DVT prophylaxis: Heparin subq Code Status:   Code Status: Full Code Family Communication: None at bedside Disposition Plan: Discharge pending specialist recommendations for discharge   Consultants:  Nephrology  Procedures:    Antimicrobials:     Subjective: Patient reports some abdominal pain when palpated which is mostly unchanged but tolerable. No other concerns.  Objective: BP 139/73 (BP Location: Left Arm)   Pulse (!) 101   Temp 98.6 F (37 C) (Oral)   Resp 18   Ht '5\' 2"'$  (1.575 m)    Wt 53.2 kg   SpO2 93%   BMI 21.45 kg/m   Examination:  General exam: Appears calm and comfortable Respiratory system: Respiratory effort normal. Gastrointestinal system: Abdomen is nondistended, soft and mild-moderately tender mostly in periumbilical area. Central nervous system: Alert and oriented. No focal neurological deficits. Musculoskeletal: No edema. No calf tenderness Skin: No cyanosis. No rashes Psychiatry: Judgement and insight appear normal. Mood & affect appropriate.    Data Reviewed: I have personally reviewed following labs and imaging studies  CBC Lab Results  Component Value Date   WBC 15.9 (H) 10/20/2022   RBC 3.01 (L) 10/20/2022   HGB 8.1 (L) 10/20/2022   HCT 25.0 (L) 10/20/2022   MCV 83.1 10/20/2022   MCH 26.9 10/20/2022   PLT 422 (H) 10/20/2022   MCHC 32.4 10/20/2022   RDW 17.0 (H) 10/20/2022   LYMPHSABS 0.4 (L) 10/16/2022   MONOABS 0.2 10/16/2022   EOSABS 0.1 10/16/2022   BASOSABS 0.0 45/80/9983     Last metabolic panel Lab Results  Component Value Date   NA 131 (L) 10/19/2022   K 4.2 10/19/2022   CL 95 (L) 10/19/2022   CO2 17 (L) 10/19/2022   BUN 100 (H) 10/19/2022   CREATININE 15.19 (H) 10/19/2022   GLUCOSE 113 (H) 10/19/2022   GFRNONAA 2 (L) 10/19/2022   GFRAA 5 (L) 05/21/2020   CALCIUM 6.9 (L) 10/19/2022   PROT 6.8 10/16/2022   ALBUMIN 2.0 (L) 10/16/2022   LABGLOB 3.0 05/21/2020   AGRATIO 1.1 (L) 05/21/2020   BILITOT 2.3 (H) 10/16/2022   ALKPHOS 48 10/16/2022   AST 15 10/16/2022   ALT 10 10/16/2022   ANIONGAP 19 (H) 10/19/2022    GFR: Estimated Creatinine Clearance: 2.8 mL/min (A) (by C-G formula based on SCr of 15.19 mg/dL (H)).  Recent Results (from  the past 240 hour(s))  Blood Culture (routine x 2)     Status: None (Preliminary result)   Collection Time: 10/16/22  4:13 AM   Specimen: BLOOD LEFT FOREARM  Result Value Ref Range Status   Specimen Description BLOOD LEFT FOREARM  Final   Special Requests   Final     BOTTLES DRAWN AEROBIC AND ANAEROBIC Blood Culture adequate volume   Culture   Final    NO GROWTH 4 DAYS Performed at [facility], [place] [address] [place], [place] [address]    Report Status PENDING  Incomplete  Resp panel by RT-PCR (RSV, Flu A&B, Covid) Anterior Nasal Swab     Status: None   Collection Time: 10/16/22  4:38 AM   Specimen: Anterior Nasal Swab  Result Value Ref Range Status   SARS Coronavirus 2 by RT PCR NEGATIVE NEGATIVE Final    Comment: (NOTE) SARS-CoV-2 target nucleic acids are NOT DETECTED.  The SARS-CoV-2 RNA is generally detectable in upper respiratory specimens during the acute phase of infection. The lowest concentration of SARS-CoV-2 viral copies this assay can detect is 138 copies/mL. A negative result does not preclude SARS-Cov-2 infection and should not be used as the sole basis for treatment or other patient management decisions. A negative result may occur with  improper specimen collection/handling, submission of specimen other than nasopharyngeal swab, presence of viral mutation(s) within the areas targeted by this assay, and inadequate number of viral copies(<138 copies/mL). A negative result must be combined with clinical observations, patient history, and epidemiological information. The expected result is Negative.  Fact Sheet for Patients:  EntrepreneurPulse.com.au  Fact Sheet for Healthcare Providers:  IncredibleEmployment.be  This test is no t yet approved or cleared by the [place] FDA and  has been authorized for detection and/or diagnosis of SARS-CoV-2 by FDA under an Emergency Use Authorization (EUA). This EUA will remain  in effect (meaning this test can be used) for the duration of the COVID-19 declaration under Section 564(b)(1) of the Act, 21 U.S.C.section 360bbb-3(b)(1), unless the authorization is terminated  or revoked sooner.       Influenza A by PCR NEGATIVE NEGATIVE Final    Influenza B by PCR NEGATIVE NEGATIVE Final    Comment: (NOTE) The Xpert Xpress SARS-CoV-2/FLU/RSV plus assay is intended as an aid in the diagnosis of influenza from Nasopharyngeal swab specimens and should not be used as a sole basis for treatment. Nasal washings and aspirates are unacceptable for Xpert Xpress SARS-CoV-2/FLU/RSV testing.  Fact Sheet for Patients: EntrepreneurPulse.com.au  Fact Sheet for Healthcare Providers: IncredibleEmployment.be  This test is not yet approved or cleared by the [place] FDA and has been authorized for detection and/or diagnosis of SARS-CoV-2 by FDA under an Emergency Use Authorization (EUA). This EUA will remain in effect (meaning this test can be used) for the duration of the COVID-19 declaration under Section 564(b)(1) of the Act, 21 U.S.C. section 360bbb-3(b)(1), unless the authorization is terminated or revoked.     Resp Syncytial Virus by PCR NEGATIVE NEGATIVE Final    Comment: (NOTE) Fact Sheet for Patients: EntrepreneurPulse.com.au  Fact Sheet for Healthcare Providers: IncredibleEmployment.be  This test is not yet approved or cleared by the [place] FDA and has been authorized for detection and/or diagnosis of SARS-CoV-2 by FDA under an Emergency Use Authorization (EUA). This EUA will remain in effect (meaning this test can be used) for the duration of the COVID-19 declaration under Section 564(b)(1) of the Act, 21 U.S.C. section 360bbb-3(b)(1), unless  the authorization is terminated or revoked.  Performed at [facility], [place] [address] [place], [place] [address]   Blood Culture (routine x 2)     Status: None (Preliminary result)   Collection Time: 10/16/22  5:00 AM   Specimen: BLOOD RIGHT FOREARM  Result Value Ref Range Status   Specimen Description BLOOD RIGHT FOREARM  Final   Special Requests   Final    BOTTLES DRAWN AEROBIC AND  ANAEROBIC Blood Culture adequate volume   Culture   Final    NO GROWTH 4 DAYS Performed at [facility], [place] [address] [place], [place] [address]    Report Status PENDING  Incomplete  Body fluid culture w Gram Stain     Status: None (Preliminary result)   Collection Time: 10/17/22  5:00 AM   Specimen: Body Fluid  Result Value Ref Range Status   Specimen Description FLUID  Final   Special Requests PERITONEAL FLUID  Final   Gram Stain   Final    WBC PRESENT, PREDOMINANTLY PMN GRAM NEGATIVE RODS CYTOSPIN SMEAR CRITICAL RESULT CALLED TO, READ BACK BY AND VERIFIED WITH: RN A.Kimmel AT 5449 ON 10/19/2022 BY T.SAAD.    Culture   Final    FEW KLEBSIELLA PNEUMONIAE SUSCEPTIBILITIES TO FOLLOW Performed at [facility], [place] [address] [place], [place] [address]    Report Status PENDING  Incomplete      Radiology Studies: No results found.    LOS: 4 days    Cordelia Poche, MD [facility] 10/20/2022, 9:27 AM   If 7PM-7AM, please contact night-coverage [email or web address]

## 2022-10-20 NOTE — Progress Notes (Signed)
Oral contrast sent to floor. RN called and stated that patient wasn't really drinking the contrast at all. She was only able to drink sips at a time and not even able to open her mouth to take a full gulp of contrast. She felt like she was force feeding her. Advised the nurse that we couldn't force her to drink it and we would just scan her without the oral contrast.

## 2022-10-20 NOTE — Hospital Course (Addendum)
Sheena Smith was admitted to the hospital with the working diagnosis of acute peritonitis, related to PD catheter.    71 y.o. female with a history of hypertension, ESRD on PD and lupus who reported right lower quadrant abdominal pain for 5 days, associated with fevers, chills, nausea, diarrhea and poor appetite. Apparently HD catheter not working appropriately. 2 days prior to admission she was diagnosed with PD catheter infection, received intraperitoneal vancomycin and planned to received ceftazidime. Peritoneal culture positive for gram negative rods. Unfortunately at home her symptoms continue to worsen with more severe abdominal pain, and dyspnea, prompting her to come to the hospital. On her initial physical examination her temp was 101.F, HR 90, blood pressure 115/65, RR 21 and 02 saturation 96% on room air. Lungs with no wheezing or rales, heart with S1 and S2 present and regular, abdomen tender to palpation, positive guarding, PD catheter in place, no lower extremity edema.   Na 133, K 3.6 CL 96 bicarbonate 19 glucose 122 bun 75 cr 13.0  Wbc 2.7 hgb 10,0 plt 381  Sars covid 19 negative Influenza negative   CT abdomen with no acute changes. Peritoneal catheter in the right lower quadrant.  Abdominal radiograph with peritoneal catheter in the right pelvis. Air fluid levels throughout the bowel loops in the central abdomen suggesting ileus.   EKG 97 bpm, right axis deviation, normal intervals, sinus rhythm with poor R R wave progression, no ST segment changes, negative T wave lead III, AvF, V5 and V6.   Patient was placed on IV antibiotic therapy.  12/18 tunneled HD catheter placed and underwent hemodialysis.  12/20 PD cathter removed.  12/22 patient on IV antibiotic therapy, plan to placed tunneled catheter on 12.26. per IR.  12/23 tolerating HD well.  12/24 continue to improve abdominal pain. Discontinue telemetry.  12/25 pending replacement of HD catheter to tunneled.

## 2022-10-20 NOTE — Progress Notes (Signed)
[facility] Progress Note   Assessment/ Plan:   Polymicrobial PD Peritonitis; OP cultures showing  Klebsiella, Citrobacter, enterobacter R to cefazolin but otherwise pan-sensitive.  Will do CT scan for possible abscess and then PD cath will need to come out- d/w pt.  PD cath not functioning anyway so will convert to hemo.  D/w pt- disappointed but in agreemejnt PD cath malfunction,-- needs to come out anyway ESRD on PD HTN on CCB, MRB, ARB as outpt Hx/o SLE Anemia, Hb 7s  Subjective:    WBC ct up, cultures from [facility] showing polymicrobial infection- Klebsiella, Citrobacter, enterobacter R to cefazolin but otherwise pan-sensitive.  Will need removal of PD catheter and CT scan too.  For HD today.   Objective:   BP 139/73 (BP Location: Left Arm)   Pulse (!) 101   Temp 98.6 F (37 C) (Oral)   Resp 18   Ht '5\' 2"'$  (1.575 m)   Wt 53.2 kg   SpO2 93%   BMI 21.45 kg/m   Physical Exam: Gen:NAD, lying in bed CVS: RRR Resp: clear Abd: + PD catheter, somewhat tender Ext: no LE edema ACCESS: nontunneled HD catheter (R), PD catheter  Labs: BMET Recent Labs  Lab 10/16/22 0031 10/18/22 0440 10/19/22 0316  NA 133* 132* 131*  K 3.6 4.2 4.2  CL 96* 95* 95*  CO2 19* 15* 17*  GLUCOSE 122* 107* 113*  BUN 75* 97* 100*  CREATININE 13.01* 15.19* 15.19*  CALCIUM 7.5* 6.9* 6.9*   CBC Recent Labs  Lab 10/16/22 0031 10/17/22 0605 10/17/22 1253 10/18/22 0440 10/19/22 0316 10/20/22 0407  WBC 2.7* 7.3  --  5.4 8.1 15.9*  NEUTROABS 2.0  --   --   --   --   --   HGB 10.0* 7.6* 7.7* 8.6* 8.4* 8.1*  HCT 32.3* 24.1* 24.5* 26.6* 25.8* 25.0*  MCV 87.3 87.0  --  83.4 83.2 83.1  PLT 381 293  --  391 407* 422*      Medications:     amLODipine  10 mg Oral Daily   Chlorhexidine Gluconate Cloth  6 each Topical Daily   furosemide  80 mg Oral BID   gentamicin cream  1 Application Topical Daily   heparin injection (subcutaneous)  5,000 Units Subcutaneous Q8H    heparin sodium (porcine)       heparin sodium (porcine) 3,000 Units in dialysis solution 1.5% low-MG/low-CA 6,000 mL dialysis solution   Peritoneal Dialysis Q24H   heparin sodium (porcine) 3,000 Units in dialysis solution 2.5% low-MG/low-CA 6,000 mL dialysis solution   Peritoneal Dialysis Q24H   hydroxychloroquine  200 mg Oral QODAY   irbesartan  300 mg Oral QPM   melatonin  3 mg Oral QHS   pantoprazole  40 mg Oral BID AC   propranolol  40 mg Oral QID   sodium chloride flush  3 mL Intravenous Q12H   spironolactone  25 mg Oral Daily     Madelon Lips MD 10/20/2022, 2:37 PM

## 2022-10-21 ENCOUNTER — Inpatient Hospital Stay (HOSPITAL_COMMUNITY): Payer: Medicare Other | Admitting: Anesthesiology

## 2022-10-21 ENCOUNTER — Encounter (HOSPITAL_COMMUNITY): Payer: Self-pay | Admitting: Internal Medicine

## 2022-10-21 ENCOUNTER — Encounter (HOSPITAL_COMMUNITY)
Admission: EM | Disposition: A | Discharge status: Skilled Nursing Facility | Payer: Self-pay | Source: Home / Self Care | Attending: Family Medicine

## 2022-10-21 ENCOUNTER — Inpatient Hospital Stay (HOSPITAL_COMMUNITY): Payer: Medicare Other

## 2022-10-21 DIAGNOSIS — E872 Acidosis, unspecified: Secondary | ICD-10-CM | POA: Diagnosis not present

## 2022-10-21 DIAGNOSIS — N186 End stage renal disease: Secondary | ICD-10-CM | POA: Diagnosis not present

## 2022-10-21 DIAGNOSIS — I10 Essential (primary) hypertension: Secondary | ICD-10-CM | POA: Diagnosis not present

## 2022-10-21 DIAGNOSIS — K650 Generalized (acute) peritonitis: Secondary | ICD-10-CM | POA: Diagnosis not present

## 2022-10-21 LAB — BASIC METABOLIC PANEL
Anion gap: 25 — ABNORMAL HIGH (ref 5–15)
Anion gap: 20 — ABNORMAL HIGH (ref 5–15)
BUN: 125 mg/dL — ABNORMAL HIGH (ref 8–23)
BUN: 49 mg/dL — ABNORMAL HIGH (ref 8–23)
CO2: 12 mmol/L — ABNORMAL LOW (ref 22–32)
CO2: 20 mmol/L — ABNORMAL LOW (ref 22–32)
Calcium: 7.5 mg/dL — ABNORMAL LOW (ref 8.9–10.3)
Calcium: 7.6 mg/dL — ABNORMAL LOW (ref 8.9–10.3)
Chloride: 96 mmol/L — ABNORMAL LOW (ref 98–111)
Chloride: 95 mmol/L — ABNORMAL LOW (ref 98–111)
Creatinine, Ser: 17.2 mg/dL — ABNORMAL HIGH (ref 0.44–1.00)
Creatinine, Ser: 8.64 mg/dL — ABNORMAL HIGH (ref 0.44–1.00)
GFR, Estimated: 2 mL/min — ABNORMAL LOW (ref >60)
GFR, Estimated: 5 mL/min — ABNORMAL LOW (ref >60)
Glucose, Bld: 55 mg/dL — ABNORMAL LOW (ref 70–99)
Glucose, Bld: 102 mg/dL — ABNORMAL HIGH (ref 70–99)
Potassium: 5.5 mmol/L — ABNORMAL HIGH (ref 3.5–5.1)
Potassium: 3.5 mmol/L (ref 3.5–5.1)
Sodium: 133 mmol/L — ABNORMAL LOW (ref 135–145)
Sodium: 135 mmol/L (ref 135–145)

## 2022-10-21 LAB — CULTURE, BLOOD (ROUTINE X 2)
Culture: NO GROWTH
Culture: NO GROWTH
Special Requests: ADEQUATE
Special Requests: ADEQUATE

## 2022-10-21 LAB — POCT I-STAT, CHEM 8
BUN: 38 mg/dL — ABNORMAL HIGH (ref 8–23)
Calcium, Ion: 0.97 mmol/L — ABNORMAL LOW (ref 1.15–1.40)
Chloride: 97 mmol/L — ABNORMAL LOW (ref 98–111)
Creatinine, Ser: 7.5 mg/dL — ABNORMAL HIGH (ref 0.44–1.00)
Glucose, Bld: 48 mg/dL — ABNORMAL LOW (ref 70–99)
HCT: 24 % — ABNORMAL LOW (ref 36.0–46.0)
Hemoglobin: 8.2 g/dL — ABNORMAL LOW (ref 12.0–15.0)
Potassium: 3.3 mmol/L — ABNORMAL LOW (ref 3.5–5.1)
Sodium: 134 mmol/L — ABNORMAL LOW (ref 135–145)
TCO2: 21 mmol/L — ABNORMAL LOW (ref 22–32)

## 2022-10-21 LAB — CBC WITH DIFFERENTIAL/PLATELET
Abs Immature Granulocytes: 0 10*3/uL (ref 0.00–0.07)
Basophils Absolute: 0 10*3/uL (ref 0.0–0.1)
Basophils Relative: 0 %
Eosinophils Absolute: 0 10*3/uL (ref 0.0–0.5)
Eosinophils Relative: 0 %
HCT: 24.3 % — ABNORMAL LOW (ref 36.0–46.0)
Hemoglobin: 7.7 g/dL — ABNORMAL LOW (ref 12.0–15.0)
Lymphocytes Relative: 2 %
Lymphs Abs: 0.3 10*3/uL — ABNORMAL LOW (ref 0.7–4.0)
MCH: 26.7 pg (ref 26.0–34.0)
MCHC: 31.7 g/dL (ref 30.0–36.0)
MCV: 84.4 fL (ref 80.0–100.0)
Monocytes Absolute: 0.6 10*3/uL (ref 0.1–1.0)
Monocytes Relative: 4 %
Neutro Abs: 14.2 10*3/uL — ABNORMAL HIGH (ref 1.7–7.7)
Neutrophils Relative %: 94 %
Platelets: 409 10*3/uL — ABNORMAL HIGH (ref 150–400)
RBC: 2.88 MIL/uL — ABNORMAL LOW (ref 3.87–5.11)
RDW: 17.1 % — ABNORMAL HIGH (ref 11.5–15.5)
WBC: 15.1 10*3/uL — ABNORMAL HIGH (ref 4.0–10.5)
nRBC: 0.3 % — ABNORMAL HIGH (ref 0.0–0.2)
nRBC: 1 /100 WBC — ABNORMAL HIGH

## 2022-10-21 LAB — GLUCOSE, CAPILLARY
Glucose-Capillary: 162 mg/dL — ABNORMAL HIGH (ref 70–99)
Glucose-Capillary: 111 mg/dL — ABNORMAL HIGH (ref 70–99)
Glucose-Capillary: 76 mg/dL (ref 70–99)

## 2022-10-21 LAB — ABO/RH: ABO/RH(D): A POS

## 2022-10-21 LAB — HEPATITIS B SURFACE ANTIBODY, QUANTITATIVE: Hep B S AB Quant (Post): 16.9 m[IU]/mL (ref >9.9)

## 2022-10-21 LAB — PATHOLOGIST SMEAR REVIEW

## 2022-10-21 SURGERY — REMOVAL, DIALYSIS CATHETER
Anesthesia: General

## 2022-10-21 MED ORDER — CEFAZOLIN SODIUM-DEXTROSE 1-4 GM/50ML-% IV SOLN
1.0000 g | INTRAVENOUS | Status: DC
  Filled 2022-10-21 (×3): qty 50

## 2022-10-21 MED ORDER — DEXTROSE 50 % IV SOLN
INTRAVENOUS | Status: AC
  Administered 2022-10-21: 25 g via INTRAVENOUS
  Filled 2022-10-21: qty 50

## 2022-10-21 MED ORDER — LIDOCAINE 2% (20 MG/ML) 5 ML SYRINGE
INTRAMUSCULAR | Status: AC
  Filled 2022-10-21: qty 5

## 2022-10-21 MED ORDER — CHLORHEXIDINE GLUCONATE 0.12 % MT SOLN: 15.0000 mL | Freq: Once | OROMUCOSAL | Status: DC

## 2022-10-21 MED ORDER — HEPARIN SODIUM (PORCINE) 1000 UNIT/ML IJ SOLN
1.3000 mL | Freq: Once | INTRAMUSCULAR | Status: AC
  Administered 2022-10-21: 1300 [IU] via INTRAVENOUS
  Filled 2022-10-21: qty 1.3

## 2022-10-21 MED ORDER — ONDANSETRON HCL 4 MG/2ML IJ SOLN
INTRAMUSCULAR | Status: AC
  Filled 2022-10-21: qty 2

## 2022-10-21 MED ORDER — DEXTROSE 50 % IV SOLN: 25.0000 g | INTRAVENOUS | Status: AC

## 2022-10-21 MED ORDER — LIDOCAINE HCL 1 % IJ SOLN
INTRAMUSCULAR | Status: AC
  Filled 2022-10-21: qty 20

## 2022-10-21 MED ORDER — PROPOFOL 10 MG/ML IV BOLUS
INTRAVENOUS | Status: AC
  Filled 2022-10-21: qty 20

## 2022-10-21 MED ORDER — ORAL CARE MOUTH RINSE: 15.0000 mL | Freq: Once | OROMUCOSAL | Status: DC

## 2022-10-21 MED ORDER — BUPIVACAINE-EPINEPHRINE (PF) 0.5% -1:200000 IJ SOLN
INTRAMUSCULAR | Status: AC
  Filled 2022-10-21: qty 30

## 2022-10-21 MED ORDER — FENTANYL CITRATE (PF) 250 MCG/5ML IJ SOLN
INTRAMUSCULAR | Status: AC
  Filled 2022-10-21: qty 5

## 2022-10-21 MED ORDER — SODIUM CHLORIDE 0.9 % IV SOLN: INTRAVENOUS | Status: DC

## 2022-10-21 MED ORDER — ROCURONIUM BROMIDE 10 MG/ML (PF) SYRINGE
PREFILLED_SYRINGE | INTRAVENOUS | Status: AC
  Filled 2022-10-21: qty 10

## 2022-10-21 MED ORDER — MIDAZOLAM HCL 2 MG/2ML IJ SOLN
INTRAMUSCULAR | Status: AC
  Filled 2022-10-21: qty 2

## 2022-10-21 NOTE — Anesthesia Preprocedure Evaluation (Deleted)
Anesthesia Evaluation  Patient identified by MRN, date of birth, ID band Patient confused    Reviewed: Allergy & Precautions, NPO status , Patient's Chart, lab work & pertinent test results, reviewed documented beta blocker date and time , Unable to perform ROS - Chart review only  Airway Mallampati: Unable to assess  TM Distance: >3 FB     Dental   Pulmonary former smoker   Tachypnea    + decreased breath sounds      Cardiovascular hypertension, Pt. on medications and Pt. on home beta blockers  Rhythm:Regular Rate:Tachycardia  EKG 10/16/22 NSR, LPFB, cannot r/o anterior MI  Echo 08/03/20 1. Left ventricular ejection fraction, by estimation, is 55 to 60%. The  left ventricle has normal function. The left ventricle has no regional  wall motion abnormalities. There is moderate asymmetric left ventricular  hypertrophy of the basal-septal  segment. Left ventricular diastolic parameters are indeterminate.   2. Right ventricular systolic function is normal. The right ventricular  size is normal. There is normal pulmonary artery systolic pressure.   3. Left atrial size was mildly dilated.   4. The mitral valve is normal in structure. Mild mitral valve  regurgitation.   5. The aortic valve is tricuspid. Aortic valve regurgitation is trivial.  No aortic stenosis is present.   6. The inferior vena cava is normal in size with greater than 50%  respiratory variability, suggesting right atrial pressure of 3 mmHg.     Neuro/Psych    GI/Hepatic ,GERD  Medicated,,Peritonitis   Endo/Other    Renal/GU ESRF and DialysisRenal diseasePeritonitis from peritoneal dialysis Hx/o SLE     Musculoskeletal   Abdominal   Peds  Hematology  (+) Blood dyscrasia, anemia Hx/o SLE   Anesthesia Other Findings   Reproductive/Obstetrics                              Anesthesia Physical Anesthesia Plan  ASA:  4  Anesthesia Plan: General   Post-op Pain Management: Minimal or no pain anticipated   Induction: Intravenous and Cricoid pressure planned  PONV Risk Score and Plan: 4 or greater and Treatment may vary due to age or medical condition and Ondansetron  Airway Management Planned: Oral ETT  Additional Equipment: ClearSight  Intra-op Plan:   Post-operative Plan: Possible Post-op intubation/ventilation  Informed Consent: I have reviewed the patients History and Physical, chart, labs and discussed the procedure including the risks, benefits and alternatives for the proposed anesthesia with the patient or authorized representative who has indicated his/her understanding and acceptance.     Dental advisory given and Consent reviewed with POA  Plan Discussed with: CRNA and Anesthesiologist  Anesthesia Plan Comments: (Consent reviewed with both daughters listed in chart via telephone )         Anesthesia Quick Evaluation

## 2022-10-21 NOTE — Progress Notes (Signed)
PROGRESS NOTE    Sheena Smith  RVU:[id number] DOB: 1951-08-15 DOA: 10/15/2022 PCP: [name], MD   Brief Narrative: Sheena Smith is a 71 y.o. female with a history of hypertension, ESRD on PD and lupus. Patient presented secondary to abdominal pain and was found to have evidence of acute peritonitis. Patient managed with IV antibiotics and transitioned to hemodialysis.   Assessment and Plan:  Klebsiella pneumoniae peritonitis In setting of peritoneal dialysis history. Patient started on empiric Vancomycin/Cefepime. Body fluid culture significant for ampicillin resistant klebsiella pneumoniae infection. Blood cultures negative. Vancomycin discontinued. -Continue Cefepime  ESRD on peritoneal dialysis Nephrology consulted. Patient transitioning to hemodialysis. IR consulted for dialysis catheter placement which was performed on 12/18. Hemodialysis started on 12/18. Plan for removal of peritoneal dialysis catheter per general surgery  Metabolic encephalopathy Possibly related to uremia. Will observe mental status progression with continued hemodialysis.  Leukocytosis Unclear reason for significant elevation. Patient previously leukopenic. No new symptoms to suggest new infection. Leukocytosis stabilized. -CBC in AM  Metabolic acidosis with increased anion gap Secondary to renal failure.  Anemia of chronic kidney disease Hemoglobin stable.  Primary hypertension -Continue amlodipine, irbesartan, spironolactone and lasix  Lupus -Continue Plaquenil  GERD CT abdomen/pelvis significant for distal esophageal thickening which may be consistent with esophagitis. Recommendation for upper endoscopy. Patient appears to be asymptomatic.   DVT prophylaxis: Heparin subq Code Status:   Code Status: Full Code Family Communication: None at bedside. Called two daughters but both calls went to voicemail Disposition Plan: Discharge pending continued specialist  recommendations for discharge   Consultants:  Nephrology  Procedures:  Hemodialysis  Antimicrobials: Vancomycin Cefepime   Subjective: Patient reports unchanged abdominal pain. No other concerns  Objective: BP 110/71 (BP Location: Right Arm)   Pulse 95   Temp 98.2 F (36.8 C) (Oral)   Resp (!) 28   Ht _0  (1.575 m)   Wt 55.3 kg   SpO2 95%   BMI 22.30 kg/m   Examination:  General exam: Appears calm and comfortable. Generally confused. Respiratory system: Clear to auscultation. Respiratory effort normal. Cardiovascular system: S1 & S2 heard, RRR. No murmurs, rubs, gallops or clicks. Gastrointestinal system: Abdomen is distended, soft and mild-moderately tender. Decreased bowel sounds heard. Central nervous system: Alert and oriented to person.  Musculoskeletal: No edema. No calf tenderness Skin: No cyanosis. No rashes   Data Reviewed: I have personally reviewed following labs and imaging studies  CBC Lab Results  Component Value Date   WBC 15.1 (H) 10/21/2022   RBC 2.88 (L) 10/21/2022   HGB 7.7 (L) 10/21/2022   HCT 24.3 (L) 10/21/2022   MCV 84.4 10/21/2022   MCH 26.7 10/21/2022   PLT 409 (H) 10/21/2022   MCHC 31.7 10/21/2022   RDW 17.1 (H) 10/21/2022   LYMPHSABS 0.3 (L) 10/21/2022   MONOABS 0.6 10/21/2022   EOSABS 0.0 10/21/2022   BASOSABS 0.0 61/68/3729     Last metabolic panel Lab Results  Component Value Date   NA 133 (L) 10/21/2022   K 5.5 (H) 10/21/2022   CL 96 (L) 10/21/2022   CO2 12 (L) 10/21/2022   BUN 125 (H) 10/21/2022   CREATININE 17.20 (H) 10/21/2022   GLUCOSE 55 (L) 10/21/2022   GFRNONAA 2 (L) 10/21/2022   GFRAA 5 (L) 05/21/2020   CALCIUM 7.5 (L) 10/21/2022   PROT 6.8 10/16/2022   ALBUMIN 2.0 (L) 10/16/2022   LABGLOB 3.0 05/21/2020   AGRATIO 1.1 (L) 05/21/2020   BILITOT 2.3 (H) 10/16/2022  ALKPHOS 48 10/16/2022   AST 15 10/16/2022   ALT 10 10/16/2022   ANIONGAP 25 (H) 10/21/2022    GFR: Estimated Creatinine Clearance:  2.4 mL/min (A) (by C-G formula based on SCr of 17.2 mg/dL (H)).  Recent Results (from the past 240 hour(s))  Blood Culture (routine x 2)     Status: None   Collection Time: 10/16/22  4:13 AM   Specimen: BLOOD LEFT FOREARM  Result Value Ref Range Status   Specimen Description BLOOD LEFT FOREARM  Final   Special Requests   Final    BOTTLES DRAWN AEROBIC AND ANAEROBIC Blood Culture adequate volume   Culture   Final    NO GROWTH 5 DAYS Performed at [facility], [address] [place], [place] [address]    Report Status 10/21/2022 FINAL  Final  Resp panel by RT-PCR (RSV, Flu A&B, Covid) Anterior Nasal Swab     Status: None   Collection Time: 10/16/22  4:38 AM   Specimen: Anterior Nasal Swab  Result Value Ref Range Status   SARS Coronavirus 2 by RT PCR NEGATIVE NEGATIVE Final    Comment: (NOTE) SARS-CoV-2 target nucleic acids are NOT DETECTED.  The SARS-CoV-2 RNA is generally detectable in upper respiratory specimens during the acute phase of infection. The lowest concentration of SARS-CoV-2 viral copies this assay can detect is 138 copies/mL. A negative result does not preclude SARS-Cov-2 infection and should not be used as the sole basis for treatment or other patient management decisions. A negative result may occur with  improper specimen collection/handling, submission of specimen other than nasopharyngeal swab, presence of viral mutation(s) within the areas targeted by this assay, and inadequate number of viral copies(<138 copies/mL). A negative result must be combined with clinical observations, patient history, and epidemiological information. The expected result is Negative.  Fact Sheet for Patients:  EntrepreneurPulse.com.au  Fact Sheet for Healthcare Providers:  IncredibleEmployment.be  This test is no t yet approved or cleared by the [place] FDA and  has been authorized for detection and/or diagnosis of SARS-CoV-2  by FDA under an Emergency Use Authorization (EUA). This EUA will remain  in effect (meaning this test can be used) for the duration of the COVID-19 declaration under Section 564(b)(1) of the Act, 21 U.S.C.section 360bbb-3(b)(1), unless the authorization is terminated  or revoked sooner.       Influenza A by PCR NEGATIVE NEGATIVE Final   Influenza B by PCR NEGATIVE NEGATIVE Final    Comment: (NOTE) The Xpert Xpress SARS-CoV-2/FLU/RSV plus assay is intended as an aid in the diagnosis of influenza from Nasopharyngeal swab specimens and should not be used as a sole basis for treatment. Nasal washings and aspirates are unacceptable for Xpert Xpress SARS-CoV-2/FLU/RSV testing.  Fact Sheet for Patients: EntrepreneurPulse.com.au  Fact Sheet for Healthcare Providers: IncredibleEmployment.be  This test is not yet approved or cleared by the [place] FDA and has been authorized for detection and/or diagnosis of SARS-CoV-2 by FDA under an Emergency Use Authorization (EUA). This EUA will remain in effect (meaning this test can be used) for the duration of the COVID-19 declaration under Section 564(b)(1) of the Act, 21 U.S.C. section 360bbb-3(b)(1), unless the authorization is terminated or revoked.     Resp Syncytial Virus by PCR NEGATIVE NEGATIVE Final    Comment: (NOTE) Fact Sheet for Patients: EntrepreneurPulse.com.au  Fact Sheet for Healthcare Providers: IncredibleEmployment.be  This test is not yet approved or cleared by the [place] FDA and has been authorized for detection  and/or diagnosis of SARS-CoV-2 by FDA under an Emergency Use Authorization (EUA). This EUA will remain in effect (meaning this test can be used) for the duration of the COVID-19 declaration under Section 564(b)(1) of the Act, 21 U.S.C. section 360bbb-3(b)(1), unless the authorization is terminated or revoked.  Performed at  [facility], [place] [address], [place], [place] [address]   Blood Culture (routine x 2)     Status: None   Collection Time: 10/16/22  5:00 AM   Specimen: BLOOD RIGHT FOREARM  Result Value Ref Range Status   Specimen Description BLOOD RIGHT FOREARM  Final   Special Requests   Final    BOTTLES DRAWN AEROBIC AND ANAEROBIC Blood Culture adequate volume   Culture   Final    NO GROWTH 5 DAYS Performed at [facility], [place] [address] [place], [place] [address]    Report Status 10/21/2022 FINAL  Final  Body fluid culture w Gram Stain     Status: None   Collection Time: 10/17/22  5:00 AM   Specimen: Body Fluid  Result Value Ref Range Status   Specimen Description FLUID  Final   Special Requests PERITONEAL FLUID  Final   Gram Stain   Final    WBC PRESENT, PREDOMINANTLY PMN GRAM NEGATIVE RODS CYTOSPIN SMEAR CRITICAL RESULT CALLED TO, READ BACK BY AND VERIFIED WITH: RN A.Critzer AT [id number] ON 10/19/2022 BY T.SAAD. Performed at [facility], [place] [address], [place], [place] [address]    Culture FEW KLEBSIELLA PNEUMONIAE  Final   Report Status 10/20/2022 FINAL  Final   Organism ID, Bacteria KLEBSIELLA PNEUMONIAE  Final      Susceptibility   Klebsiella pneumoniae - MIC*    AMPICILLIN >=32 RESISTANT Resistant     CEFAZOLIN <=4 SENSITIVE Sensitive     CEFEPIME <=0.12 SENSITIVE Sensitive     CEFTAZIDIME <=1 SENSITIVE Sensitive     CEFTRIAXONE <=0.25 SENSITIVE Sensitive     CIPROFLOXACIN <=0.25 SENSITIVE Sensitive     GENTAMICIN <=1 SENSITIVE Sensitive     IMIPENEM 0.5 SENSITIVE Sensitive     TRIMETH/SULFA <=20 SENSITIVE Sensitive     AMPICILLIN/SULBACTAM 4 SENSITIVE Sensitive     PIP/TAZO <=4 SENSITIVE Sensitive     * FEW KLEBSIELLA PNEUMONIAE      Radiology Studies: CT ABDOMEN PELVIS WO CONTRAST  Result Date: 10/21/2022 CLINICAL DATA:  Peritonitis, on peritoneal dialysis, evaluate for infection/abscess EXAM: CT ABDOMEN AND PELVIS WITHOUT CONTRAST TECHNIQUE:  Multidetector CT imaging of the abdomen and pelvis was performed following the standard protocol without IV contrast. RADIATION DOSE REDUCTION: This exam was performed according to the departmental dose-optimization program which includes automated exposure control, adjustment of the mA and/or kV according to patient size and/or use of iterative reconstruction technique. COMPARISON:  10/16/2022 FINDINGS: Motion degraded images. Lower chest: Small bilateral pleural effusions, progressive. Associated bilateral lower lobe atelectasis. Hepatobiliary: Unenhanced liver is unremarkable. Gallbladder is unremarkable. No intrahepatic or extrahepatic duct dilatation. Pancreas: Within normal limits. Spleen: Within normal limits. Adrenals/Urinary Tract: Adrenal is within normal limits. Bilateral renal parenchymal atrophy. 3 mm parenchymal calcification in the lateral interpolar left kidney (series 3/image 40). Bilateral lower pole renal sinus cysts measuring up to 2.4 cm on the right (series 3/image 42), benign. No follow-up is recommended. Bladder is underdistended but unremarkable. Stomach/Bowel: Stomach is notable for a small hiatal hernia. No evidence of bowel obstruction. Two long segments of small bowel in the central abdomen which demonstrate mildly irregular wall thickening (series 3/images 55  and 61), nonspecific but at least raising the possibility of small bowel enteritis. No associated pneumatosis or free air. Normal appendix (series 3/image 84). No colonic wall thickening or inflammatory changes. Vascular/Lymphatic: No evidence of abdominal aortic aneurysm. Atherosclerotic calcifications of the abdominal aorta and branch vessels. No suspicious abdominopelvic lymphadenopathy. Reproductive: Heterogeneous uterus with uterine fibroids. Bilateral ovaries are within normal limits. Other: Moderate abdominopelvic ascites, mildly progressive this patient on peritoneal dialysis. Peritoneal dialysis catheter terminates in the  right lower abdomen. Musculoskeletal: Subcutaneous staples along the right paramidline anterior abdominal wall. No focal osseous lesions. Mild degenerative changes at L2-3 with prominent Schmorl's node deformity along the inferior endplate of L2. IMPRESSION: Motion degraded images. Possible small bowel enteritis involving two loops in the central abdomen, equivocal. No associated pneumatosis or free air. Moderate abdominopelvic ascites, likely related to patient's peritoneal dialysis. Small bilateral pleural effusions, progressive. Associated bilateral lower lobe atelectasis. Additional ancillary findings as above. Electronically Signed   By: Julian Hy M.D.   On: 10/21/2022 00:31   IR Fluoro Guide CV Line Right  Result Date: 10/20/2022 INDICATION: ESRD requiring HD.  Leukocytosis with suspected bacteremia. EXAM: NON-TUNNELED CENTRAL VENOUS HEMODIALYSIS CATHETER PLACEMENT WITH ULTRASOUND AND FLUOROSCOPIC GUIDANCE COMPARISON:  None Available. MEDICATIONS: None CONTRAST:  5 mL Omnipaque 300 IV FLUOROSCOPY TIME:  Fluoroscopic dose; 3 mGy COMPLICATIONS: None immediate. PROCEDURE: Informed written consent was obtained from the patient and/or patient's representative after a discussion of the risks, benefits, and alternatives to treatment. Questions regarding the procedure were encouraged and answered. The RIGHT neck and chest were prepped with chlorhexidine in a sterile fashion, and a sterile drape was applied covering the operative field. Maximum barrier sterile technique with sterile gowns and gloves were used for the procedure. A timeout was performed prior to the initiation of the procedure. After the overlying soft tissues were anesthetized, a small venotomy incision was created and a micropuncture kit was utilized to access the internal jugular vein. Real-time ultrasound guidance was utilized for vascular access including the acquisition of a permanent ultrasound image documenting patency of the  accessed vessel. The microwire was utilized to measure appropriate catheter length. A stiff glidewire was advanced to the level of the IVC. Under fluoroscopic guidance, the venotomy was serially dilated, ultimately allowing placement of a 16 cm temporary Trialysis catheter with tip ultimately terminating within the superior aspect of the right atrium. Final catheter positioning was confirmed and documented with a spot radiographic image. The catheter aspirates and flushes normally. The catheter was flushed with appropriate volume heparin dwells. The catheter exit site was secured with a 0-Prolene retention suture. A dressing was placed. The patient tolerated the procedure well without immediate post procedural complication. IMPRESSION: Successful placement of a RIGHT internal jugular approach 16 cm non tunneled/temporary dialysis catheter The tip of the catheter is positioned within the proximal RIGHT atrium. The catheter is ready for immediate use. PLAN: This catheter may be converted to a tunneled dialysis catheter at a later date as indicated. Michaelle Birks, MD [facility] Radiology Electronically Signed   By: Michaelle Birks M.D.   On: 10/20/2022 18:56   IR US Guide Vasc Access Right  Result Date: 10/20/2022 INDICATION: ESRD requiring HD.  Leukocytosis with suspected bacteremia. EXAM: NON-TUNNELED CENTRAL VENOUS HEMODIALYSIS CATHETER PLACEMENT WITH ULTRASOUND AND FLUOROSCOPIC GUIDANCE COMPARISON:  None Available. MEDICATIONS: None CONTRAST:  5 mL Omnipaque 300 IV FLUOROSCOPY TIME:  Fluoroscopic dose; 3 mGy COMPLICATIONS: None immediate. PROCEDURE: Informed written consent was obtained from the patient  and/or patient's representative after a discussion of the risks, benefits, and alternatives to treatment. Questions regarding the procedure were encouraged and answered. The RIGHT neck and chest were prepped with chlorhexidine in a sterile fashion, and a sterile  drape was applied covering the operative field. Maximum barrier sterile technique with sterile gowns and gloves were used for the procedure. A timeout was performed prior to the initiation of the procedure. After the overlying soft tissues were anesthetized, a small venotomy incision was created and a micropuncture kit was utilized to access the internal jugular vein. Real-time ultrasound guidance was utilized for vascular access including the acquisition of a permanent ultrasound image documenting patency of the accessed vessel. The microwire was utilized to measure appropriate catheter length. A stiff glidewire was advanced to the level of the IVC. Under fluoroscopic guidance, the venotomy was serially dilated, ultimately allowing placement of a 16 cm temporary Trialysis catheter with tip ultimately terminating within the superior aspect of the right atrium. Final catheter positioning was confirmed and documented with a spot radiographic image. The catheter aspirates and flushes normally. The catheter was flushed with appropriate volume heparin dwells. The catheter exit site was secured with a 0-Prolene retention suture. A dressing was placed. The patient tolerated the procedure well without immediate post procedural complication. IMPRESSION: Successful placement of a RIGHT internal jugular approach 16 cm non tunneled/temporary dialysis catheter The tip of the catheter is positioned within the proximal RIGHT atrium. The catheter is ready for immediate use. PLAN: This catheter may be converted to a tunneled dialysis catheter at a later date as indicated. Michaelle Birks, MD [facility] Radiology Electronically Signed   By: Michaelle Birks M.D.   On: 10/20/2022 18:56      LOS: 5 days    Cordelia Poche, MD [facility] 10/21/2022, 9:52 AM   If 7PM-7AM, please contact night-coverage [email or web address]

## 2022-10-21 NOTE — Progress Notes (Signed)
Hypoglycemic Event  CBG: 48  Treatment: D50 50 mL (25 gm)  Symptoms: Pale and lethargic  Follow-up CBG: Time:1329 CBG Result:162  Possible Reasons for Event: Inadequate meal intake  Comments/MD notified: MD notified    Randa Ngo

## 2022-10-21 NOTE — Procedures (Signed)
HD Note:  Some information was entered later than the data was gathered due to patient care needs. The stated time with the data is accurate.  Received patient in bed to unit.  Alert, not responsive to questions.  Unable to determine orientation. Informed consent obtained from daughter verbally and verified by two Rns and placed in the chart.    Patient was not responding to questions.  When spoken to and she was visually engaged, she would say "ok" to every  question with a smile.    Access used: Temporary right IJ Access issues: The catheter had intermittent pressure issues.  BFR reduced by 50 to 250 with good results.  Based on BP issues, UF had to be turned off for a period of time, see flow sheet.    Total UF removed: 1200  Report called to both OR nurse and assigned staff nurse on hall.  Patient transported by OR staff to [facility]

## 2022-10-21 NOTE — Progress Notes (Signed)
[facility] Progress Note   Assessment/ Plan:   Polymicrobial PD Peritonitis; OP cultures showing  Klebsiella, Citrobacter, enterobacter R to cefazolin but otherwise pan-sensitive.  Will do CT scan for possible abscess and then PD cath will need to come out- d/w pt.  PD cath not functioning anyway so will convert to hemo.  D/w pt- disappointed but in agreement.  Case postponed toda- appreciate general surgery.  Will do CT head  PD cath malfunction,-- needs to come out anyway ESRD on PD HTN on CCB, MRB, ARB as outpt Hx/o SLE Anemia, Hb 7s  Subjective:    S/p HD today, went down to OR for PD cath removal but was hypoglycemic and tachypneic.  Case postponed.  This PM she is awake but doesn't look like she feels very well- d/w hospitalist.  Also some concern for AMS, will do CT head.     Objective:   BP (!) 140/76 (BP Location: Left Arm)   Pulse (!) 109   Temp 98.8 F (37.1 C) (Oral)   Resp (!) 26   Ht '5\' 2"'$  (1.575 m)   Wt 55.3 kg   SpO2 100%   BMI 22.30 kg/m   Physical Exam: Gen:NAD, lying in bed CVS: RRR Resp: clear Abd: + PD catheter, somewhat tender Ext: no LE edema ACCESS: nontunneled HD catheter (R), PD catheter  Labs: BMET Recent Labs  Lab 10/16/22 0031 10/18/22 0440 10/19/22 0316 10/21/22 0326 10/21/22 1301  NA 133* 132* 131* 133* 134*  K 3.6 4.2 4.2 5.5* 3.3*  CL 96* 95* 95* 96* 97*  CO2 19* 15* 17* 12*  --   GLUCOSE 122* 107* 113* 55* 48*  BUN 75* 97* 100* 125* 38*  CREATININE 13.01* 15.19* 15.19* 17.20* 7.50*  CALCIUM 7.5* 6.9* 6.9* 7.5*  --    CBC Recent Labs  Lab 10/16/22 0031 10/17/22 0605 10/18/22 0440 10/19/22 0316 10/20/22 0407 10/21/22 0326 10/21/22 1301  WBC 2.7*   < > 5.4 8.1 15.9* 15.1*  --   NEUTROABS 2.0  --   --   --   --  14.2*  --   HGB 10.0*   < > 8.6* 8.4* 8.1* 7.7* 8.2*  HCT 32.3*   < > 26.6* 25.8* 25.0* 24.3* 24.0*  MCV 87.3   < > 83.4 83.2 83.1 84.4  --   PLT 381   < > 391 407* 422* 409*  --    < > = values  in this interval not displayed.      Medications:     amLODipine  10 mg Oral Daily   Chlorhexidine Gluconate Cloth  6 each Topical Daily   furosemide  80 mg Oral BID   gentamicin cream  1 Application Topical Daily   heparin injection (subcutaneous)  5,000 Units Subcutaneous Q8H   heparin sodium (porcine) 3,000 Units in dialysis solution 1.5% low-MG/low-CA 6,000 mL dialysis solution   Peritoneal Dialysis Q24H   heparin sodium (porcine) 3,000 Units in dialysis solution 2.5% low-MG/low-CA 6,000 mL dialysis solution   Peritoneal Dialysis Q24H   hydroxychloroquine  200 mg Oral QODAY   irbesartan  300 mg Oral QPM   melatonin  3 mg Oral QHS   pantoprazole  40 mg Oral BID AC   propranolol  40 mg Oral QID   sodium chloride flush  3 mL Intravenous Q12H   spironolactone  25 mg Oral Daily     Madelon Lips MD 10/21/2022, 3:55 PM

## 2022-10-21 NOTE — Progress Notes (Signed)
Patient would not open her mouth to take her evening medications. Attempted to give her water and meds, but she would not open her mouth. Her daughter and grandson are present in the room and they could not get her to open her mouth either. Provider notified.

## 2022-10-21 NOTE — Consult Note (Signed)
Consult Note  Sheena Smith [birth year]  [id number].    Requesting MD: Madelon Lips, MD Chief Complaint/Reason for Consult: PD peritonitis HPI:  Patient is a 71 year old female currently admitted with PD catheter associated peritonitis. She presented 12/15 with abdominal pain and was started on empiric antibiotics, body fluid culture grew out ampicillin resistant klebsiella pneumoniae. Blood cultures were negative. Patient has undergone IR non-tunneled HD catheter placement and is transitioning to HD. Patient has been on PD for since 2020 and per report part of the catheter came loose when dressing was changed 2 weeks ago. Patient is currently confused and unable to provide any history therefore there information in this HPI was taken from the chart.  PMH otherwise significant for HTN and lupus. No blood thinners at baseline.     Family History  Problem Relation Age of Onset   Ovarian cancer Mother    Hypertension Father    Diabetes Father     Past Medical History:  Diagnosis Date   Hypertension    Lupus ([place])    Renal failure     Past Surgical History:  Procedure Laterality Date   FOOT SURGERY     IR FLUORO GUIDE CV LINE RIGHT  10/20/2022   IR US GUIDE VASC ACCESS RIGHT  10/20/2022   SMALL INTESTINE SURGERY      Social History:  reports that she has quit smoking. She has never used smokeless tobacco. She reports that she does not currently use alcohol. She reports that she does not currently use drugs.  Allergies:  Allergies  Allergen Reactions   Nsaids     "Kidney Disease. Exception to this NSAID intolerance is aspirin 81-343m daily"'   Other     Per pt, states that she was informed she was allergic to sulfa after allergy testing  "Kidney Disease. Exception to this NSAID intolerance is aspirin 81-3231mdaily"'   Atenolol     palpitation   Elemental Sulfur     Medications Prior to Admission  Medication Sig Dispense Refill   amLODipine (NORVASC) 10  MG tablet Take 1 tablet by mouth daily.     ferrous sulfate 324 MG TBEC Take 324 mg by mouth daily with breakfast.     furosemide (LASIX) 80 MG tablet Take 80 mg by mouth 2 (two) times daily.     hydroxychloroquine (PLAQUENIL) 200 MG tablet Take 200 mg by mouth every other day.     irbesartan (AVAPRO) 300 MG tablet Take 300 mg by mouth every evening.     pantoprazole (PROTONIX) 40 MG tablet Take 40 mg by mouth daily.     propranolol (INDERAL) 20 MG tablet Take 40 mg by mouth 4 (four) times daily.     sodium bicarbonate 650 MG tablet Take 650 mg by mouth once as needed for heartburn.     spironolactone (ALDACTONE) 25 MG tablet Take 25 mg by mouth daily.      Blood pressure 136/80, pulse (!) 108, temperature 98.2 F (36.8 C), temperature source Oral, resp. rate (!) 34, height _0  (1.575 m), weight 55.3 kg, SpO2 96 %. Physical Exam:  General: chronically ill appearing female lying in bed in NAD HEENT: head is normocephalic, atraumatic.  Sclera are noninjected.  Pupils equal and round.  Ears and nose without any masses or lesions.  Mouth is pink and moist Heart: tachycardic, HR low 100s.  Palpable pedal pulses bilaterally Lungs: CTAB, no wheezes, rhonchi, or rales noted.  Respiratory effort nonlabored  on room air Abd: well healed midline incision. soft, NT, ND, +BS, no masses, hernias, or organomegaly. PD catheter present in the LLQ MS: all 4 extremities are symmetrical with no cyanosis, clubbing, or edema. Skin: warm and dry with no masses, lesions, or rashes Psych: Alert but not oriented   Results for orders placed or performed during the hospital encounter of 10/15/22 (from the past 48 hour(s))  Hepatitis B surface antigen     Status: None   Collection Time: 10/19/22 12:33 PM  Result Value Ref Range   Hepatitis B Surface Ag NON REACTIVE NON REACTIVE    Comment: Performed at [facility], [address] [place], [place] [address]  Hepatitis B surface antibody,quantitative      Status: None   Collection Time: 10/19/22 12:33 PM  Result Value Ref Range   Hep B S AB Quant (Post) 16.9 Immunity>9.9 mIU/mL    Comment: (NOTE)  Status of Immunity                     Anti-HBs Level  ------------------                     -------------- Inconsistent with Immunity                   0.0 - 9.9 Consistent with Immunity                          >9.9 Performed At: [facility] [place], [place] [id number] Rush Farmer MD SA:[id number]   CBC     Status: Abnormal   Collection Time: 10/20/22  4:07 AM  Result Value Ref Range   WBC 15.9 (H) 4.0 - 10.5 K/uL   RBC 3.01 (L) 3.87 - 5.11 MIL/uL   Hemoglobin 8.1 (L) 12.0 - 15.0 g/dL   HCT 25.0 (L) 36.0 - 46.0 %   MCV 83.1 80.0 - 100.0 fL   MCH 26.9 26.0 - 34.0 pg   MCHC 32.4 30.0 - 36.0 g/dL   RDW 17.0 (H) 11.5 - 15.5 %   Platelets 422 (H) 150 - 400 K/uL   nRBC 0.3 (H) 0.0 - 0.2 %    Comment: Performed at [facility], [place] [address] [place], [place] [address]  CBC with Differential/Platelet     Status: Abnormal   Collection Time: 10/21/22  3:26 AM  Result Value Ref Range   WBC 15.1 (H) 4.0 - 10.5 K/uL   RBC 2.88 (L) 3.87 - 5.11 MIL/uL   Hemoglobin 7.7 (L) 12.0 - 15.0 g/dL   HCT 24.3 (L) 36.0 - 46.0 %   MCV 84.4 80.0 - 100.0 fL   MCH 26.7 26.0 - 34.0 pg   MCHC 31.7 30.0 - 36.0 g/dL   RDW 17.1 (H) 11.5 - 15.5 %   Platelets 409 (H) 150 - 400 K/uL   nRBC 0.3 (H) 0.0 - 0.2 %   Neutrophils Relative % 94 %   Neutro Abs 14.2 (H) 1.7 - 7.7 K/uL   Lymphocytes Relative 2 %   Lymphs Abs 0.3 (L) 0.7 - 4.0 K/uL   Monocytes Relative 4 %   Monocytes Absolute 0.6 0.1 - 1.0 K/uL   Eosinophils Relative 0 %   Eosinophils Absolute 0.0 0.0 - 0.5 K/uL   Basophils Relative 0 %   Basophils Absolute 0.0 0.0 - 0.1 K/uL   nRBC 1 (H) 0 /100 WBC  Abs Immature Granulocytes 0.00 0.00 - 0.07 K/uL   Tear Drop Cells PRESENT    Polychromasia PRESENT     Comment: Performed at [facility], [place] [address],  [place], [place] [address]  Basic metabolic panel     Status: Abnormal   Collection Time: 10/21/22  3:26 AM  Result Value Ref Range   Sodium 133 (L) 135 - 145 mmol/L   Potassium 5.5 (H) 3.5 - 5.1 mmol/L   Chloride 96 (L) 98 - 111 mmol/L   CO2 12 (L) 22 - 32 mmol/L   Glucose, Bld 55 (L) 70 - 99 mg/dL    Comment: Glucose reference range applies only to samples taken after fasting for at least 8 hours.   BUN 125 (H) 8 - 23 mg/dL   Creatinine, Ser 17.20 (H) 0.44 - 1.00 mg/dL   Calcium 7.5 (L) 8.9 - 10.3 mg/dL   GFR, Estimated 2 (L) >60 mL/min    Comment: (NOTE) Calculated using the CKD-EPI Creatinine Equation (2021)    Anion gap 25 (H) 5 - 15    Comment: ELECTROLYTES REPEATED TO VERIFY Performed at [facility] [address] [place], [place] [address]    CT ABDOMEN PELVIS WO CONTRAST  Result Date: 10/21/2022 CLINICAL DATA:  Peritonitis, on peritoneal dialysis, evaluate for infection/abscess EXAM: CT ABDOMEN AND PELVIS WITHOUT CONTRAST TECHNIQUE: Multidetector CT imaging of the abdomen and pelvis was performed following the standard protocol without IV contrast. RADIATION DOSE REDUCTION: This exam was performed according to the departmental dose-optimization program which includes automated exposure control, adjustment of the mA and/or kV according to patient size and/or use of iterative reconstruction technique. COMPARISON:  10/16/2022 FINDINGS: Motion degraded images. Lower chest: Small bilateral pleural effusions, progressive. Associated bilateral lower lobe atelectasis. Hepatobiliary: Unenhanced liver is unremarkable. Gallbladder is unremarkable. No intrahepatic or extrahepatic duct dilatation. Pancreas: Within normal limits. Spleen: Within normal limits. Adrenals/Urinary Tract: Adrenal is within normal limits. Bilateral renal parenchymal atrophy. 3 mm parenchymal calcification in the lateral interpolar left kidney (series 3/image 40). Bilateral lower pole renal sinus cysts measuring up to  2.4 cm on the right (series 3/image 42), benign. No follow-up is recommended. Bladder is underdistended but unremarkable. Stomach/Bowel: Stomach is notable for a small hiatal hernia. No evidence of bowel obstruction. Two long segments of small bowel in the central abdomen which demonstrate mildly irregular wall thickening (series 3/images 55 and 61), nonspecific but at least raising the possibility of small bowel enteritis. No associated pneumatosis or free air. Normal appendix (series 3/image 84). No colonic wall thickening or inflammatory changes. Vascular/Lymphatic: No evidence of abdominal aortic aneurysm. Atherosclerotic calcifications of the abdominal aorta and branch vessels. No suspicious abdominopelvic lymphadenopathy. Reproductive: Heterogeneous uterus with uterine fibroids. Bilateral ovaries are within normal limits. Other: Moderate abdominopelvic ascites, mildly progressive this patient on peritoneal dialysis. Peritoneal dialysis catheter terminates in the right lower abdomen. Musculoskeletal: Subcutaneous staples along the right paramidline anterior abdominal wall. No focal osseous lesions. Mild degenerative changes at L2-3 with prominent Schmorl's node deformity along the inferior endplate of L2. IMPRESSION: Motion degraded images. Possible small bowel enteritis involving two loops in the central abdomen, equivocal. No associated pneumatosis or free air. Moderate abdominopelvic ascites, likely related to patient's peritoneal dialysis. Small bilateral pleural effusions, progressive. Associated bilateral lower lobe atelectasis. Additional ancillary findings as above. Electronically Signed   By: Julian Hy M.D.   On: 10/21/2022 00:31   IR Fluoro Guide CV Line Right  Result Date: 10/20/2022 INDICATION: ESRD requiring  HD.  Leukocytosis with suspected bacteremia. EXAM: NON-TUNNELED CENTRAL VENOUS HEMODIALYSIS CATHETER PLACEMENT WITH ULTRASOUND AND FLUOROSCOPIC GUIDANCE COMPARISON:  None  Available. MEDICATIONS: None CONTRAST:  5 mL Omnipaque 300 IV FLUOROSCOPY TIME:  Fluoroscopic dose; 3 mGy COMPLICATIONS: None immediate. PROCEDURE: Informed written consent was obtained from the patient and/or patient's representative after a discussion of the risks, benefits, and alternatives to treatment. Questions regarding the procedure were encouraged and answered. The RIGHT neck and chest were prepped with chlorhexidine in a sterile fashion, and a sterile drape was applied covering the operative field. Maximum barrier sterile technique with sterile gowns and gloves were used for the procedure. A timeout was performed prior to the initiation of the procedure. After the overlying soft tissues were anesthetized, a small venotomy incision was created and a micropuncture kit was utilized to access the internal jugular vein. Real-time ultrasound guidance was utilized for vascular access including the acquisition of a permanent ultrasound image documenting patency of the accessed vessel. The microwire was utilized to measure appropriate catheter length. A stiff glidewire was advanced to the level of the IVC. Under fluoroscopic guidance, the venotomy was serially dilated, ultimately allowing placement of a 16 cm temporary Trialysis catheter with tip ultimately terminating within the superior aspect of the right atrium. Final catheter positioning was confirmed and documented with a spot radiographic image. The catheter aspirates and flushes normally. The catheter was flushed with appropriate volume heparin dwells. The catheter exit site was secured with a 0-Prolene retention suture. A dressing was placed. The patient tolerated the procedure well without immediate post procedural complication. IMPRESSION: Successful placement of a RIGHT internal jugular approach 16 cm non tunneled/temporary dialysis catheter The tip of the catheter is positioned within the proximal RIGHT atrium. The catheter is ready for immediate use.  PLAN: This catheter may be converted to a tunneled dialysis catheter at a later date as indicated. Michaelle Birks, MD [facility] Radiology Electronically Signed   By: Michaelle Birks M.D.   On: 10/20/2022 18:56   IR US Guide Vasc Access Right  Result Date: 10/20/2022 INDICATION: ESRD requiring HD.  Leukocytosis with suspected bacteremia. EXAM: NON-TUNNELED CENTRAL VENOUS HEMODIALYSIS CATHETER PLACEMENT WITH ULTRASOUND AND FLUOROSCOPIC GUIDANCE COMPARISON:  None Available. MEDICATIONS: None CONTRAST:  5 mL Omnipaque 300 IV FLUOROSCOPY TIME:  Fluoroscopic dose; 3 mGy COMPLICATIONS: None immediate. PROCEDURE: Informed written consent was obtained from the patient and/or patient's representative after a discussion of the risks, benefits, and alternatives to treatment. Questions regarding the procedure were encouraged and answered. The RIGHT neck and chest were prepped with chlorhexidine in a sterile fashion, and a sterile drape was applied covering the operative field. Maximum barrier sterile technique with sterile gowns and gloves were used for the procedure. A timeout was performed prior to the initiation of the procedure. After the overlying soft tissues were anesthetized, a small venotomy incision was created and a micropuncture kit was utilized to access the internal jugular vein. Real-time ultrasound guidance was utilized for vascular access including the acquisition of a permanent ultrasound image documenting patency of the accessed vessel. The microwire was utilized to measure appropriate catheter length. A stiff glidewire was advanced to the level of the IVC. Under fluoroscopic guidance, the venotomy was serially dilated, ultimately allowing placement of a 16 cm temporary Trialysis catheter with tip ultimately terminating within the superior aspect of the right atrium. Final catheter positioning was confirmed and documented with a spot radiographic image. The  catheter aspirates and flushes normally. The catheter  was flushed with appropriate volume heparin dwells. The catheter exit site was secured with a 0-Prolene retention suture. A dressing was placed. The patient tolerated the procedure well without immediate post procedural complication. IMPRESSION: Successful placement of a RIGHT internal jugular approach 16 cm non tunneled/temporary dialysis catheter The tip of the catheter is positioned within the proximal RIGHT atrium. The catheter is ready for immediate use. PLAN: This catheter may be converted to a tunneled dialysis catheter at a later date as indicated. Michaelle Birks, MD [facility] Radiology Electronically Signed   By: Michaelle Birks M.D.   On: 10/20/2022 18:56      Assessment/Plan PD peritonitis  - CT without abscess, some enteritis - WBC 15K, afebrile - body fluid culture with ampicillin resistant klebsiella pneumoniae - Patient with infected PD catheter, we are asked to see for removal. We will call patient's family for consent, if agreeable will plan for surgery later today once she completes HD. She has been NPO since midnight.  FEN: NPO VTE: SQH ID: [id number] 12/15>>   I reviewed Consultant nephrology notes, hospitalist notes, last 24 h vitals and pain scores, last 48 h intake and output, last 24 h labs and trends, and last 24 h imaging results.   Wellington Hampshire, [facility] 10/21/2022, 10:51 AM Please see Amion for pager number during day hours 7:00am-4:30pm

## 2022-10-21 NOTE — Progress Notes (Signed)
Her case was postponed at this time today. After dialysis she was found to be profoundly hypoglycemic. In preop, she was tachypneic with RR in the 30s. Discussed with Dr. Fransisco Beau and there would be high probability she would remain intubated and on the ventilator following her procedure. Concern for her ability to tolerate general anesthesia at this juncture as well. We therefore opted to postpone the procedure today and re-evaluate tomorrow after her electrolytes and glucose are stabilized and she has time to recover from her metabolic insult. Continue IV abx at this time; I do not think removing this PD catheter is an urgent/emergent issue and antibiotics are certainly appropriate to treat SBP in this setting until she stabilizes and can potentially tolerate a general anesthetic.  I updated her daughter, Courtnee, over the phone as well relaying all the above.    Nadeen Landau, MD [facility], [facility]

## 2022-10-21 NOTE — Progress Notes (Signed)
Patient returned to floor from Preop. Patient surgery was not done. Patient had CBG of 48 and was treated. Patient returned to floor at approximately 1440. Patient does not follow commands or answer questions. CBG 111. Resp 26, BP 140/76 and HR 109. Messaged provider and he will come see.

## 2022-10-22 DIAGNOSIS — I10 Essential (primary) hypertension: Secondary | ICD-10-CM | POA: Diagnosis not present

## 2022-10-22 DIAGNOSIS — N186 End stage renal disease: Secondary | ICD-10-CM | POA: Diagnosis not present

## 2022-10-22 DIAGNOSIS — K650 Generalized (acute) peritonitis: Secondary | ICD-10-CM | POA: Diagnosis not present

## 2022-10-22 DIAGNOSIS — E872 Acidosis, unspecified: Secondary | ICD-10-CM | POA: Diagnosis not present

## 2022-10-22 LAB — GLUCOSE, CAPILLARY: Glucose-Capillary: 95 mg/dL (ref 70–99)

## 2022-10-22 MED ORDER — SODIUM CHLORIDE 0.9 % IV SOLN
1.0000 g | INTRAVENOUS | Status: DC
  Administered 2022-10-22 – 2022-10-24 (×3): 1 g via INTRAVENOUS
  Filled 2022-10-22 (×3): qty 10

## 2022-10-22 MED ORDER — ALTEPLASE 2 MG IJ SOLR
INTRAMUSCULAR | Status: AC
  Administered 2022-10-22: 2 mg
  Filled 2022-10-22: qty 2

## 2022-10-22 MED ORDER — CHLORHEXIDINE GLUCONATE 0.12 % MT SOLN
OROMUCOSAL | Status: AC
  Filled 2022-10-22: qty 15

## 2022-10-22 MED ORDER — HEPARIN SODIUM (PORCINE) 1000 UNIT/ML IJ SOLN
INTRAMUSCULAR | Status: AC
  Administered 2022-10-22: 2600 [IU] via INTRAVENOUS
  Filled 2022-10-22: qty 4

## 2022-10-22 MED ORDER — CHLORHEXIDINE GLUCONATE 0.12 % MT SOLN: 15.0000 mL | Freq: Once | OROMUCOSAL | Status: AC

## 2022-10-22 MED ORDER — ORAL CARE MOUTH RINSE: 15.0000 mL | Freq: Once | OROMUCOSAL | Status: AC

## 2022-10-22 MED ORDER — SODIUM CHLORIDE 0.9 % IV SOLN: INTRAVENOUS | Status: DC

## 2022-10-22 NOTE — Progress Notes (Addendum)
Received patient in bed to unit.  Alert  Informed consent signed and in chart.   Treatment initiated: 1616 Treatment completed: 1721  Catheter line began to pull air in the line. Pressure also felt when attempting to flush the line. MD foster notified.  Transported back to the room  Alert, without acute distress.  Hand-off given to patient's nurse.   Access used: catheter Access issues: line pulling air   Total UF removed: -432m Medication(s) given: heparin    10/22/22 1721  Vitals  Temp 98.3 F (36.8 C)  Temp Source Oral  BP (!) 149/79  MAP (mmHg) 98  Pulse Rate (!) 110  ECG Heart Rate (!) 110  Resp (!) 31  Oxygen Therapy  SpO2 97 %  O2 Device Nasal Cannula  O2 Flow Rate (L/min) 2 L/min  During Treatment Monitoring  Intra-Hemodialysis Comments Tx completed  Post Treatment  Dialyzer Clearance Lightly streaked  Duration of HD Treatment -hour(s) 54 hour(s) (minutes)  Hemodialysis Intake (mL) 0 mL  Liters Processed 16  Fluid Removed (mL) -400 mL  Tolerated HD Treatment No (Comment)  Hemodialysis Catheter Right Internal jugular Triple lumen Temporary (Non-Tunneled)  Placement Date/Time: 10/20/22 1320   Serial / Lot #: [id number] Expiration Date: 02/23/27  Time Out: Correct patient;Correct site;Correct procedure  Maximum sterile barrier precautions: Hand hygiene;Mask;Cap;Sterile gown;Sterile gloves;Large sterile ...  Site Condition Other (Comment)  Blue Lumen Status Heparin locked  Red Lumen Status Heparin locked  Catheter fill solution Heparin 1000 units/ml  Catheter fill volume (Arterial) 1.3 cc  Catheter fill volume (Venous) 1.3  Dressing Type Transparent  Dressing Status Antimicrobial disc in place  Dressing Change Due 10/28/22        [facility]

## 2022-10-22 NOTE — Inpatient Diabetes Management (Signed)
Inpatient Diabetes Program Recommendations  AACE/ADA: New Consensus Statement on Inpatient Glycemic Control (2015)  Target Ranges:  Prepandial:   less than 140 mg/dL      Peak postprandial:   less than 180 mg/dL (1-2 hours)      Critically ill patients:  140 - 180 mg/dL   Lab Results  Component Value Date   GLUCAP 76 10/21/2022    Review of Glycemic Control  Diabetes history: none Outpatient Diabetes medications: none Current orders for Inpatient glycemic control: none  Inpatient Diabetes Program Recommendations:    Given episode yesterday with hypoglycemia, may want to consider adding CBGs TID & HS.   Thanks, Bronson Curb, MSN, RNC-OB Diabetes Coordinator [phone] (8a-5p)

## 2022-10-22 NOTE — Progress Notes (Signed)
Patient to dialysis this morning.

## 2022-10-22 NOTE — Progress Notes (Signed)
PROGRESS NOTE    Sheena Smith  FVC:[id number] DOB: May 08, 1951 DOA: 10/15/2022 PCP: [name], MD   Brief Narrative: Sheena Smith is a 71 y.o. female with a history of hypertension, ESRD on PD and lupus. Patient presented secondary to abdominal pain and was found to have evidence of acute peritonitis. Patient managed with IV antibiotics and transitioned to hemodialysis.   Assessment and Plan:  Klebsiella pneumoniae peritonitis In setting of peritoneal dialysis history. Patient started on empiric Vancomycin/Cefepime. Body fluid culture significant for ampicillin resistant klebsiella pneumoniae infection. Blood cultures negative. -Continue Cefepime/Vancomycin  ESRD on peritoneal dialysis Nephrology consulted. Patient transitioning to hemodialysis. IR consulted for dialysis catheter placement which was performed on 12/18. Hemodialysis started on 12/18. Plan for removal of peritoneal dialysis catheter per general surgery on 12/20.  Acute metabolic/toxic encephalopathy Possibly related to uremia vs Cefepime vs both. CT head unremarkable for acute process. Mental status significantly improved today and appears near/at baseline.  Leukocytosis Unclear reason for significant elevation. Patient previously leukopenic. No new symptoms to suggest new infection. Leukocytosis stabilized. -CBC in AM  Metabolic acidosis with increased anion gap Secondary to renal failure. Management with HD.  Anemia of chronic kidney disease Hemoglobin stable.  Primary hypertension -Continue amlodipine, irbesartan, spironolactone and lasix  Lupus -Continue Plaquenil  GERD CT abdomen/pelvis significant for distal esophageal thickening which may be consistent with esophagitis. Recommendation for upper endoscopy. Patient appears to be asymptomatic.   DVT prophylaxis: Heparin subq Code Status:   Code Status: Full Code Family Communication: None at bedside. Called daughter but went to  voicemail Disposition Plan: Discharge pending continued specialist recommendations for discharge   Consultants:  Nephrology General surgery  Procedures:  Hemodialysis  Antimicrobials: Vancomycin Cefepime Cefazolin   Subjective: Patient reports no issues this morning. She reports some abdominal pain but it is very mild.  Objective: BP (!) 140/82 (BP Location: Left Arm)   Pulse 99   Temp 98.5 F (36.9 C) (Oral)   Resp (!) 24   Ht _0  (1.575 m)   Wt 51.4 kg   SpO2 100%   BMI 20.73 kg/m   Examination:  General exam: Appears calm and comfortable Respiratory system: Clear to auscultation. Respiratory effort normal. Cardiovascular system: S1 & S2 heard, RRR. Gastrointestinal system: Abdomen is distended, soft and mildly tender. Normal bowel sounds heard. Central nervous system: Alert and oriented to person and place. Psychiatry: Judgement and insight appear normal. Mood & affect appropriate.    Data Reviewed: I have personally reviewed following labs and imaging studies  CBC Lab Results  Component Value Date   WBC 15.1 (H) 10/21/2022   RBC 2.88 (L) 10/21/2022   HGB 8.2 (L) 10/21/2022   HCT 24.0 (L) 10/21/2022   MCV 84.4 10/21/2022   MCH 26.7 10/21/2022   PLT 409 (H) 10/21/2022   MCHC 31.7 10/21/2022   RDW 17.1 (H) 10/21/2022   LYMPHSABS 0.3 (L) 10/21/2022   MONOABS 0.6 10/21/2022   EOSABS 0.0 10/21/2022   BASOSABS 0.0 63/84/6659     Last metabolic panel Lab Results  Component Value Date   NA 135 10/21/2022   K 3.5 10/21/2022   CL 95 (L) 10/21/2022   CO2 20 (L) 10/21/2022   BUN 49 (H) 10/21/2022   CREATININE 8.64 (H) 10/21/2022   GLUCOSE 102 (H) 10/21/2022   GFRNONAA 5 (L) 10/21/2022   GFRAA 5 (L) 05/21/2020   CALCIUM 7.6 (L) 10/21/2022   PROT 6.8 10/16/2022   ALBUMIN 2.0 (L) 10/16/2022  LABGLOB 3.0 05/21/2020   AGRATIO 1.1 (L) 05/21/2020   BILITOT 2.3 (H) 10/16/2022   ALKPHOS 48 10/16/2022   AST 15 10/16/2022   ALT 10 10/16/2022    ANIONGAP 20 (H) 10/21/2022    GFR: Estimated Creatinine Clearance: 4.9 mL/min (A) (by C-G formula based on SCr of 8.64 mg/dL (H)).  Recent Results (from the past 240 hour(s))  Blood Culture (routine x 2)     Status: None   Collection Time: 10/16/22  4:13 AM   Specimen: BLOOD LEFT FOREARM  Result Value Ref Range Status   Specimen Description BLOOD LEFT FOREARM  Final   Special Requests   Final    BOTTLES DRAWN AEROBIC AND ANAEROBIC Blood Culture adequate volume   Culture   Final    NO GROWTH 5 DAYS Performed at [facility], [address], [place], [place] [address]    Report Status 10/21/2022 FINAL  Final  Resp panel by RT-PCR (RSV, Flu A&B, Covid) Anterior Nasal Swab     Status: None   Collection Time: 10/16/22  4:38 AM   Specimen: Anterior Nasal Swab  Result Value Ref Range Status   SARS Coronavirus 2 by RT PCR NEGATIVE NEGATIVE Final    Comment: (NOTE) SARS-CoV-2 target nucleic acids are NOT DETECTED.  The SARS-CoV-2 RNA is generally detectable in upper respiratory specimens during the acute phase of infection. The lowest concentration of SARS-CoV-2 viral copies this assay can detect is 138 copies/mL. A negative result does not preclude SARS-Cov-2 infection and should not be used as the sole basis for treatment or other patient management decisions. A negative result may occur with  improper specimen collection/handling, submission of specimen other than nasopharyngeal swab, presence of viral mutation(s) within the areas targeted by this assay, and inadequate number of viral copies(<138 copies/mL). A negative result must be combined with clinical observations, patient history, and epidemiological information. The expected result is Negative.  Fact Sheet for Patients:  EntrepreneurPulse.com.au  Fact Sheet for Healthcare Providers:  IncredibleEmployment.be  This test is no t yet approved or cleared by the [place] FDA and   has been authorized for detection and/or diagnosis of SARS-CoV-2 by FDA under an Emergency Use Authorization (EUA). This EUA will remain  in effect (meaning this test can be used) for the duration of the COVID-19 declaration under Section 564(b)(1) of the Act, 21 U.S.C.section 360bbb-3(b)(1), unless the authorization is terminated  or revoked sooner.       Influenza A by PCR NEGATIVE NEGATIVE Final   Influenza B by PCR NEGATIVE NEGATIVE Final    Comment: (NOTE) The Xpert Xpress SARS-CoV-2/FLU/RSV plus assay is intended as an aid in the diagnosis of influenza from Nasopharyngeal swab specimens and should not be used as a sole basis for treatment. Nasal washings and aspirates are unacceptable for Xpert Xpress SARS-CoV-2/FLU/RSV testing.  Fact Sheet for Patients: EntrepreneurPulse.com.au  Fact Sheet for Healthcare Providers: IncredibleEmployment.be  This test is not yet approved or cleared by the [place] FDA and has been authorized for detection and/or diagnosis of SARS-CoV-2 by FDA under an Emergency Use Authorization (EUA). This EUA will remain in effect (meaning this test can be used) for the duration of the COVID-19 declaration under Section 564(b)(1) of the Act, 21 U.S.C. section 360bbb-3(b)(1), unless the authorization is terminated or revoked.     Resp Syncytial Virus by PCR NEGATIVE NEGATIVE Final    Comment: (NOTE) Fact Sheet for Patients: EntrepreneurPulse.com.au  Fact Sheet for Healthcare Providers: IncredibleEmployment.be  This test  is not yet approved or cleared by the [place] and has been authorized for detection and/or diagnosis of SARS-CoV-2 by FDA under an Emergency Use Authorization (EUA). This EUA will remain in effect (meaning this test can be used) for the duration of the COVID-19 declaration under Section 564(b)(1) of the Act, 21 U.S.C. section 360bbb-3(b)(1),  unless the authorization is terminated or revoked.  Performed at [facility], [place] [address], [place], [place] [address]   Blood Culture (routine x 2)     Status: None   Collection Time: 10/16/22  5:00 AM   Specimen: BLOOD RIGHT FOREARM  Result Value Ref Range Status   Specimen Description BLOOD RIGHT FOREARM  Final   Special Requests   Final    BOTTLES DRAWN AEROBIC AND ANAEROBIC Blood Culture adequate volume   Culture   Final    NO GROWTH 5 DAYS Performed at [facility], [place] [address] [place], [place] [address]    Report Status 10/21/2022 FINAL  Final  Body fluid culture w Gram Stain     Status: None   Collection Time: 10/17/22  5:00 AM   Specimen: Body Fluid  Result Value Ref Range Status   Specimen Description FLUID  Final   Special Requests PERITONEAL FLUID  Final   Gram Stain   Final    WBC PRESENT, PREDOMINANTLY PMN GRAM NEGATIVE RODS CYTOSPIN SMEAR CRITICAL RESULT CALLED TO, READ BACK BY AND VERIFIED WITH: RN A.Lesage AT 3825 ON 10/19/2022 BY T.SAAD. Performed at [facility], [place] [address] [place], [place] [address]    Culture FEW KLEBSIELLA PNEUMONIAE  Final   Report Status 10/20/2022 FINAL  Final   Organism ID, Bacteria KLEBSIELLA PNEUMONIAE  Final      Susceptibility   Klebsiella pneumoniae - MIC*    AMPICILLIN >=32 RESISTANT Resistant     CEFAZOLIN <=4 SENSITIVE Sensitive     CEFEPIME <=0.12 SENSITIVE Sensitive     CEFTAZIDIME <=1 SENSITIVE Sensitive     CEFTRIAXONE <=0.25 SENSITIVE Sensitive     CIPROFLOXACIN <=0.25 SENSITIVE Sensitive     GENTAMICIN <=1 SENSITIVE Sensitive     IMIPENEM 0.5 SENSITIVE Sensitive     TRIMETH/SULFA <=20 SENSITIVE Sensitive     AMPICILLIN/SULBACTAM 4 SENSITIVE Sensitive     PIP/TAZO <=4 SENSITIVE Sensitive     * FEW KLEBSIELLA PNEUMONIAE      Radiology Studies: CT HEAD WO CONTRAST (5MM)  Result Date: 10/21/2022 CLINICAL DATA:  Mental status change, unknown cause EXAM: CT HEAD WITHOUT CONTRAST  TECHNIQUE: Contiguous axial images were obtained from the base of the skull through the vertex without intravenous contrast. RADIATION DOSE REDUCTION: This exam was performed according to the departmental dose-optimization program which includes automated exposure control, adjustment of the mA and/or kV according to patient size and/or use of iterative reconstruction technique. COMPARISON:  None Available. FINDINGS: Brain: No evidence of acute infarction, hemorrhage, mass, mass effect, or midline shift. No hydrocephalus or extra-axial fluid collection. Encephalomalacia in left inferior frontal lobe, vertex left frontal lobe, and lateral right frontal and temporal lobe, likely sequela of remote infarcts. Periventricular white matter changes, likely the sequela of chronic small vessel ischemic disease. Vascular: No hyperdense vessel. Skull: Normal. Negative for fracture or focal lesion. Sinuses/Orbits: No acute finding. Other: The mastoid air cells are well aerated. IMPRESSION: No acute intracranial process. Electronically Signed   By: Merilyn Baba M.D.   On: 10/21/2022 23:55   CT ABDOMEN PELVIS WO CONTRAST  Result Date:  10/21/2022 CLINICAL DATA:  Peritonitis, on peritoneal dialysis, evaluate for infection/abscess EXAM: CT ABDOMEN AND PELVIS WITHOUT CONTRAST TECHNIQUE: Multidetector CT imaging of the abdomen and pelvis was performed following the standard protocol without IV contrast. RADIATION DOSE REDUCTION: This exam was performed according to the departmental dose-optimization program which includes automated exposure control, adjustment of the mA and/or kV according to patient size and/or use of iterative reconstruction technique. COMPARISON:  10/16/2022 FINDINGS: Motion degraded images. Lower chest: Small bilateral pleural effusions, progressive. Associated bilateral lower lobe atelectasis. Hepatobiliary: Unenhanced liver is unremarkable. Gallbladder is unremarkable. No intrahepatic or extrahepatic duct  dilatation. Pancreas: Within normal limits. Spleen: Within normal limits. Adrenals/Urinary Tract: Adrenal is within normal limits. Bilateral renal parenchymal atrophy. 3 mm parenchymal calcification in the lateral interpolar left kidney (series 3/image 40). Bilateral lower pole renal sinus cysts measuring up to 2.4 cm on the right (series 3/image 42), benign. No follow-up is recommended. Bladder is underdistended but unremarkable. Stomach/Bowel: Stomach is notable for a small hiatal hernia. No evidence of bowel obstruction. Two long segments of small bowel in the central abdomen which demonstrate mildly irregular wall thickening (series 3/images 55 and 61), nonspecific but at least raising the possibility of small bowel enteritis. No associated pneumatosis or free air. Normal appendix (series 3/image 84). No colonic wall thickening or inflammatory changes. Vascular/Lymphatic: No evidence of abdominal aortic aneurysm. Atherosclerotic calcifications of the abdominal aorta and branch vessels. No suspicious abdominopelvic lymphadenopathy. Reproductive: Heterogeneous uterus with uterine fibroids. Bilateral ovaries are within normal limits. Other: Moderate abdominopelvic ascites, mildly progressive this patient on peritoneal dialysis. Peritoneal dialysis catheter terminates in the right lower abdomen. Musculoskeletal: Subcutaneous staples along the right paramidline anterior abdominal wall. No focal osseous lesions. Mild degenerative changes at L2-3 with prominent Schmorl's node deformity along the inferior endplate of L2. IMPRESSION: Motion degraded images. Possible small bowel enteritis involving two loops in the central abdomen, equivocal. No associated pneumatosis or free air. Moderate abdominopelvic ascites, likely related to patient's peritoneal dialysis. Small bilateral pleural effusions, progressive. Associated bilateral lower lobe atelectasis. Additional ancillary findings as above. Electronically Signed   By:  Julian Hy M.D.   On: 10/21/2022 00:31   IR Fluoro Guide CV Line Right  Result Date: 10/20/2022 INDICATION: ESRD requiring HD.  Leukocytosis with suspected bacteremia. EXAM: NON-TUNNELED CENTRAL VENOUS HEMODIALYSIS CATHETER PLACEMENT WITH ULTRASOUND AND FLUOROSCOPIC GUIDANCE COMPARISON:  None Available. MEDICATIONS: None CONTRAST:  5 mL Omnipaque 300 IV FLUOROSCOPY TIME:  Fluoroscopic dose; 3 mGy COMPLICATIONS: None immediate. PROCEDURE: Informed written consent was obtained from the patient and/or patient's representative after a discussion of the risks, benefits, and alternatives to treatment. Questions regarding the procedure were encouraged and answered. The RIGHT neck and chest were prepped with chlorhexidine in a sterile fashion, and a sterile drape was applied covering the operative field. Maximum barrier sterile technique with sterile gowns and gloves were used for the procedure. A timeout was performed prior to the initiation of the procedure. After the overlying soft tissues were anesthetized, a small venotomy incision was created and a micropuncture kit was utilized to access the internal jugular vein. Real-time ultrasound guidance was utilized for vascular access including the acquisition of a permanent ultrasound image documenting patency of the accessed vessel. The microwire was utilized to measure appropriate catheter length. A stiff glidewire was advanced to the level of the IVC. Under fluoroscopic guidance, the venotomy was serially dilated, ultimately allowing placement of a 16 cm temporary Trialysis catheter with tip ultimately terminating within the superior aspect of the  right atrium. Final catheter positioning was confirmed and documented with a spot radiographic image. The catheter aspirates and flushes normally. The catheter was flushed with appropriate volume heparin dwells. The catheter exit site was secured with a 0-Prolene retention suture. A dressing was placed. The patient  tolerated the procedure well without immediate post procedural complication. IMPRESSION: Successful placement of a RIGHT internal jugular approach 16 cm non tunneled/temporary dialysis catheter The tip of the catheter is positioned within the proximal RIGHT atrium. The catheter is ready for immediate use. PLAN: This catheter may be converted to a tunneled dialysis catheter at a later date as indicated. Michaelle Birks, MD [facility] Electronically Signed   By: Michaelle Birks M.D.   On: 10/20/2022 18:56   IR US Guide Vasc Access Right  Result Date: 10/20/2022 INDICATION: ESRD requiring HD.  Leukocytosis with suspected bacteremia. EXAM: NON-TUNNELED CENTRAL VENOUS HEMODIALYSIS CATHETER PLACEMENT WITH ULTRASOUND AND FLUOROSCOPIC GUIDANCE COMPARISON:  None Available. MEDICATIONS: None CONTRAST:  5 mL Omnipaque 300 IV FLUOROSCOPY TIME:  Fluoroscopic dose; 3 mGy COMPLICATIONS: None immediate. PROCEDURE: Informed written consent was obtained from the patient and/or patient's representative after a discussion of the risks, benefits, and alternatives to treatment. Questions regarding the procedure were encouraged and answered. The RIGHT neck and chest were prepped with chlorhexidine in a sterile fashion, and a sterile drape was applied covering the operative field. Maximum barrier sterile technique with sterile gowns and gloves were used for the procedure. A timeout was performed prior to the initiation of the procedure. After the overlying soft tissues were anesthetized, a small venotomy incision was created and a micropuncture kit was utilized to access the internal jugular vein. Real-time ultrasound guidance was utilized for vascular access including the acquisition of a permanent ultrasound image documenting patency of the accessed vessel. The microwire was utilized to measure appropriate catheter length. A stiff glidewire was advanced to the level of the IVC.  Under fluoroscopic guidance, the venotomy was serially dilated, ultimately allowing placement of a 16 cm temporary Trialysis catheter with tip ultimately terminating within the superior aspect of the right atrium. Final catheter positioning was confirmed and documented with a spot radiographic image. The catheter aspirates and flushes normally. The catheter was flushed with appropriate volume heparin dwells. The catheter exit site was secured with a 0-Prolene retention suture. A dressing was placed. The patient tolerated the procedure well without immediate post procedural complication. IMPRESSION: Successful placement of a RIGHT internal jugular approach 16 cm non tunneled/temporary dialysis catheter The tip of the catheter is positioned within the proximal RIGHT atrium. The catheter is ready for immediate use. PLAN: This catheter may be converted to a tunneled dialysis catheter at a later date as indicated. Michaelle Birks, MD [facility] Electronically Signed   By: Michaelle Birks M.D.   On: 10/20/2022 18:56      LOS: 6 days    Cordelia Poche, MD [facility] 10/22/2022, 9:26 AM   If 7PM-7AM, please contact night-coverage [email or web address]

## 2022-10-22 NOTE — Progress Notes (Signed)
Pt, HD catheter is not working well, flushed lines and reversed lines, turn BFR down to 270 and arterial line continue to alarm, turn BFR down to 250 and still alarm. Notified MD Hollie Salk, and instructions to dwell cathflo in both line and pt come back on second shift, per, Madelon Lips MD.

## 2022-10-22 NOTE — Progress Notes (Signed)
Progress Note  1 Day Post-Op  Subjective: Case delayed yesterday for hypoglycemia and tachypnea. Pt oriented to self and place today but told me the year is 2007 and that we are in the hospital because her husband had a heart attack  Objective: Vital signs in last 24 hours: Temp:  [98.4 F (36.9 C)-100.2 F (37.9 C)] 98.5 F (36.9 C) (12/20 0745) Pulse Rate:  [98-123] 99 (12/20 0841) Resp:  [16-35] 24 (12/20 0841) BP: (83-143)/(61-83) 140/82 (12/20 0841) SpO2:  [94 %-100 %] 100 % (12/20 0745) Weight:  [51.4 kg] 51.4 kg (12/20 0500) Last BM Date : 10/21/22  Intake/Output from previous day: 12/19 0701 - 12/20 0700 In: 0  Out: 1200  Intake/Output this shift: No intake/output data recorded.  PE: General: chronically ill appearing female lying in bed in NAD HEENT: head is normocephalic, atraumatic.  Sclera are noninjected.  Pupils equal and round.  Ears and nose without any masses or lesions.  Mouth is pink and moist Heart: tachycardic, HR low 100s Lungs: Respiratory effort nonlabored on room air Abd: well healed midline incision. soft, NT, ND, +BS, no masses, hernias, or organomegaly. PD catheter present in the LLQ Skin: warm and dry with no masses, lesions, or rashes Psych: Alert, oriented to person and place but not to time or situation   Lab Results:  Recent Labs    10/20/22 0407 10/21/22 0326 10/21/22 1301  WBC 15.9* 15.1*  --   HGB 8.1* 7.7* 8.2*  HCT 25.0* 24.3* 24.0*  PLT 422* 409*  --    BMET Recent Labs    10/21/22 0326 10/21/22 1301 10/21/22 1548  NA 133* 134* 135  K 5.5* 3.3* 3.5  CL 96* 97* 95*  CO2 12*  --  20*  GLUCOSE 55* 48* 102*  BUN 125* 38* 49*  CREATININE 17.20* 7.50* 8.64*  CALCIUM 7.5*  --  7.6*   PT/INR No results for input(s): "LABPROT", "INR" in the last 72 hours. CMP     Component Value Date/Time   NA 135 10/21/2022 1548   NA 137 05/21/2020 0905   K 3.5 10/21/2022 1548   CL 95 (L) 10/21/2022 1548   CO2 20 (L) 10/21/2022  1548   GLUCOSE 102 (H) 10/21/2022 1548   BUN 49 (H) 10/21/2022 1548   BUN 57 (H) 05/21/2020 0905   CREATININE 8.64 (H) 10/21/2022 1548   CALCIUM 7.6 (L) 10/21/2022 1548   PROT 6.8 10/16/2022 0031   PROT 6.2 05/21/2020 0905   ALBUMIN 2.0 (L) 10/16/2022 0031   ALBUMIN 3.2 (L) 05/21/2020 0905   AST 15 10/16/2022 0031   ALT 10 10/16/2022 0031   ALKPHOS 48 10/16/2022 0031   BILITOT 2.3 (H) 10/16/2022 0031   BILITOT 0.4 05/21/2020 0905   GFRNONAA 5 (L) 10/21/2022 1548   GFRAA 5 (L) 05/21/2020 0905   Lipase     Component Value Date/Time   LIPASE 40 10/16/2022 0031       Studies/Results: CT HEAD WO CONTRAST (5MM)  Result Date: 10/21/2022 CLINICAL DATA:  Mental status change, unknown cause EXAM: CT HEAD WITHOUT CONTRAST TECHNIQUE: Contiguous axial images were obtained from the base of the skull through the vertex without intravenous contrast. RADIATION DOSE REDUCTION: This exam was performed according to the departmental dose-optimization program which includes automated exposure control, adjustment of the mA and/or kV according to patient size and/or use of iterative reconstruction technique. COMPARISON:  None Available. FINDINGS: Brain: No evidence of acute infarction, hemorrhage, mass, mass effect, or midline shift.  No hydrocephalus or extra-axial fluid collection. Encephalomalacia in left inferior frontal lobe, vertex left frontal lobe, and lateral right frontal and temporal lobe, likely sequela of remote infarcts. Periventricular white matter changes, likely the sequela of chronic small vessel ischemic disease. Vascular: No hyperdense vessel. Skull: Normal. Negative for fracture or focal lesion. Sinuses/Orbits: No acute finding. Other: The mastoid air cells are well aerated. IMPRESSION: No acute intracranial process. Electronically Signed   By: Merilyn Baba M.D.   On: 10/21/2022 23:55   CT ABDOMEN PELVIS WO CONTRAST  Result Date: 10/21/2022 CLINICAL DATA:  Peritonitis, on peritoneal  dialysis, evaluate for infection/abscess EXAM: CT ABDOMEN AND PELVIS WITHOUT CONTRAST TECHNIQUE: Multidetector CT imaging of the abdomen and pelvis was performed following the standard protocol without IV contrast. RADIATION DOSE REDUCTION: This exam was performed according to the departmental dose-optimization program which includes automated exposure control, adjustment of the mA and/or kV according to patient size and/or use of iterative reconstruction technique. COMPARISON:  10/16/2022 FINDINGS: Motion degraded images. Lower chest: Small bilateral pleural effusions, progressive. Associated bilateral lower lobe atelectasis. Hepatobiliary: Unenhanced liver is unremarkable. Gallbladder is unremarkable. No intrahepatic or extrahepatic duct dilatation. Pancreas: Within normal limits. Spleen: Within normal limits. Adrenals/Urinary Tract: Adrenal is within normal limits. Bilateral renal parenchymal atrophy. 3 mm parenchymal calcification in the lateral interpolar left kidney (series 3/image 40). Bilateral lower pole renal sinus cysts measuring up to 2.4 cm on the right (series 3/image 42), benign. No follow-up is recommended. Bladder is underdistended but unremarkable. Stomach/Bowel: Stomach is notable for a small hiatal hernia. No evidence of bowel obstruction. Two long segments of small bowel in the central abdomen which demonstrate mildly irregular wall thickening (series 3/images 55 and 61), nonspecific but at least raising the possibility of small bowel enteritis. No associated pneumatosis or free air. Normal appendix (series 3/image 84). No colonic wall thickening or inflammatory changes. Vascular/Lymphatic: No evidence of abdominal aortic aneurysm. Atherosclerotic calcifications of the abdominal aorta and branch vessels. No suspicious abdominopelvic lymphadenopathy. Reproductive: Heterogeneous uterus with uterine fibroids. Bilateral ovaries are within normal limits. Other: Moderate abdominopelvic ascites, mildly  progressive this patient on peritoneal dialysis. Peritoneal dialysis catheter terminates in the right lower abdomen. Musculoskeletal: Subcutaneous staples along the right paramidline anterior abdominal wall. No focal osseous lesions. Mild degenerative changes at L2-3 with prominent Schmorl's node deformity along the inferior endplate of L2. IMPRESSION: Motion degraded images. Possible small bowel enteritis involving two loops in the central abdomen, equivocal. No associated pneumatosis or free air. Moderate abdominopelvic ascites, likely related to patient's peritoneal dialysis. Small bilateral pleural effusions, progressive. Associated bilateral lower lobe atelectasis. Additional ancillary findings as above. Electronically Signed   By: Julian Hy M.D.   On: 10/21/2022 00:31   IR Fluoro Guide CV Line Right  Result Date: 10/20/2022 INDICATION: ESRD requiring HD.  Leukocytosis with suspected bacteremia. EXAM: NON-TUNNELED CENTRAL VENOUS HEMODIALYSIS CATHETER PLACEMENT WITH ULTRASOUND AND FLUOROSCOPIC GUIDANCE COMPARISON:  None Available. MEDICATIONS: None CONTRAST:  5 mL Omnipaque 300 IV FLUOROSCOPY TIME:  Fluoroscopic dose; 3 mGy COMPLICATIONS: None immediate. PROCEDURE: Informed written consent was obtained from the patient and/or patient's representative after a discussion of the risks, benefits, and alternatives to treatment. Questions regarding the procedure were encouraged and answered. The RIGHT neck and chest were prepped with chlorhexidine in a sterile fashion, and a sterile drape was applied covering the operative field. Maximum barrier sterile technique with sterile gowns and gloves were used for the procedure. A timeout was performed prior to the initiation of the procedure. After  the overlying soft tissues were anesthetized, a small venotomy incision was created and a micropuncture kit was utilized to access the internal jugular vein. Real-time ultrasound guidance was utilized for vascular  access including the acquisition of a permanent ultrasound image documenting patency of the accessed vessel. The microwire was utilized to measure appropriate catheter length. A stiff glidewire was advanced to the level of the IVC. Under fluoroscopic guidance, the venotomy was serially dilated, ultimately allowing placement of a 16 cm temporary Trialysis catheter with tip ultimately terminating within the superior aspect of the right atrium. Final catheter positioning was confirmed and documented with a spot radiographic image. The catheter aspirates and flushes normally. The catheter was flushed with appropriate volume heparin dwells. The catheter exit site was secured with a 0-Prolene retention suture. A dressing was placed. The patient tolerated the procedure well without immediate post procedural complication. IMPRESSION: Successful placement of a RIGHT internal jugular approach 16 cm non tunneled/temporary dialysis catheter The tip of the catheter is positioned within the proximal RIGHT atrium. The catheter is ready for immediate use. PLAN: This catheter may be converted to a tunneled dialysis catheter at a later date as indicated. Michaelle Birks, MD [facility] Radiology Electronically Signed   By: Michaelle Birks M.D.   On: 10/20/2022 18:56   IR US Guide Vasc Access Right  Result Date: 10/20/2022 INDICATION: ESRD requiring HD.  Leukocytosis with suspected bacteremia. EXAM: NON-TUNNELED CENTRAL VENOUS HEMODIALYSIS CATHETER PLACEMENT WITH ULTRASOUND AND FLUOROSCOPIC GUIDANCE COMPARISON:  None Available. MEDICATIONS: None CONTRAST:  5 mL Omnipaque 300 IV FLUOROSCOPY TIME:  Fluoroscopic dose; 3 mGy COMPLICATIONS: None immediate. PROCEDURE: Informed written consent was obtained from the patient and/or patient's representative after a discussion of the risks, benefits, and alternatives to treatment. Questions regarding the procedure were encouraged and answered. The  RIGHT neck and chest were prepped with chlorhexidine in a sterile fashion, and a sterile drape was applied covering the operative field. Maximum barrier sterile technique with sterile gowns and gloves were used for the procedure. A timeout was performed prior to the initiation of the procedure. After the overlying soft tissues were anesthetized, a small venotomy incision was created and a micropuncture kit was utilized to access the internal jugular vein. Real-time ultrasound guidance was utilized for vascular access including the acquisition of a permanent ultrasound image documenting patency of the accessed vessel. The microwire was utilized to measure appropriate catheter length. A stiff glidewire was advanced to the level of the IVC. Under fluoroscopic guidance, the venotomy was serially dilated, ultimately allowing placement of a 16 cm temporary Trialysis catheter with tip ultimately terminating within the superior aspect of the right atrium. Final catheter positioning was confirmed and documented with a spot radiographic image. The catheter aspirates and flushes normally. The catheter was flushed with appropriate volume heparin dwells. The catheter exit site was secured with a 0-Prolene retention suture. A dressing was placed. The patient tolerated the procedure well without immediate post procedural complication. IMPRESSION: Successful placement of a RIGHT internal jugular approach 16 cm non tunneled/temporary dialysis catheter The tip of the catheter is positioned within the proximal RIGHT atrium. The catheter is ready for immediate use. PLAN: This catheter may be converted to a tunneled dialysis catheter at a later date as indicated. Michaelle Birks, MD [facility] Radiology Electronically Signed   By: Michaelle Birks M.D.   On: 10/20/2022 18:56    Anti-infectives: Anti-infectives (From admission, onward)    Start  Dose/Rate Route Frequency Ordered Stop    10/22/22 1200  ceFEPIme (MAXIPIME) 1 g in sodium chloride 0.9 % 100 mL IVPB        1 g 200 mL/hr over 30 Minutes Intravenous Every 24 hours 10/22/22 0751     10/21/22 1230  ceFAZolin (ANCEF) IVPB 1 g/50 mL premix  Status:  Discontinued        1 g 100 mL/hr over 30 Minutes Intravenous Every 24 hours 10/21/22 1218 10/22/22 0751   10/17/22 1000  ceFEPIme (MAXIPIME) 1 g in sodium chloride 0.9 % 100 mL IVPB  Status:  Discontinued        1 g 200 mL/hr over 30 Minutes Intravenous Every 24 hours 10/16/22 0954 10/21/22 1218   10/17/22 1000  hydroxychloroquine (PLAQUENIL) tablet 200 mg        200 mg Oral Every other day 10/16/22 1256     10/16/22 0954  vancomycin variable dose per unstable renal function (pharmacist dosing)  Status:  Discontinued         Does not apply See admin instructions 10/16/22 0954 10/17/22 1448   10/16/22 0430  vancomycin (VANCOCIN) IVPB 1000 mg/200 mL premix        1,000 mg 200 mL/hr over 60 Minutes Intravenous  Once 10/16/22 0426 10/16/22 0631   10/16/22 0415  ceFEPIme (MAXIPIME) 2 g in sodium chloride 0.9 % 100 mL IVPB        2 g 200 mL/hr over 30 Minutes Intravenous  Once 10/16/22 0414 10/16/22 0631        Assessment/Plan  PD peritonitis  - CT without abscess, some enteritis - WBC 15K yesterday, afebrile - body fluid culture with ampicillin resistant klebsiella pneumoniae - Patient with infected PD catheter - attempted to take to OR yesterday but patient tachypneic and hypoglycemic in pre-op. Case cancelled. Will touch base with renal and internal med today for medical clearance    FEN: NPO VTE: SQH ID: [id number]/15>>   LOS: 6 days     Norm Parcel, [facility] Surgery 10/22/2022, 9:54 AM Please see Amion for pager number during day hours 7:00am-4:30pm

## 2022-10-22 NOTE — Progress Notes (Signed)
Patient taken to dialysis. 

## 2022-10-22 NOTE — Anesthesia Preprocedure Evaluation (Signed)
Anesthesia Evaluation  Patient identified by MRN, date of birth, ID band Patient awake    Reviewed: Allergy & Precautions, NPO status , Patient's Chart, lab work & pertinent test results  History of Anesthesia Complications Negative for: history of anesthetic complications  Airway Mallampati: IV  TM Distance: >3 FB Neck ROM: Full    Dental  (+) Edentulous Lower, Edentulous Upper, Dental Advisory Given   Pulmonary former smoker   Pulmonary exam normal        Cardiovascular hypertension, Pt. on medications and Pt. on home beta blockers Normal cardiovascular exam  Normal echo 2021   Neuro/Psych negative neurological ROS     GI/Hepatic Neg liver ROS,GERD  ,,  Endo/Other  negative endocrine ROS    Renal/GU ESRF and DialysisRenal disease  negative genitourinary   Musculoskeletal negative musculoskeletal ROS (+)    Abdominal   Peds  Hematology  (+) Blood dyscrasia, anemia   Anesthesia Other Findings Lupus  Acute bacterial peritonitis 2/2 infected PD cath  Reproductive/Obstetrics                             Anesthesia Physical Anesthesia Plan  ASA: 3  Anesthesia Plan: General   Post-op Pain Management: Tylenol PO (pre-op)*   Induction: Intravenous  PONV Risk Score and Plan: 3 and Ondansetron, Dexamethasone and Treatment may vary due to age or medical condition  Airway Management Planned: Oral ETT  Additional Equipment: None  Intra-op Plan:   Post-operative Plan: Extubation in OR  Informed Consent: I have reviewed the patients History and Physical, chart, labs and discussed the procedure including the risks, benefits and alternatives for the proposed anesthesia with the patient or authorized representative who has indicated his/her understanding and acceptance.     Dental advisory given  Plan Discussed with:   Anesthesia Plan Comments:        Anesthesia Quick  Evaluation

## 2022-10-22 NOTE — TOC Initial Note (Signed)
Transition of Care [facility]) - Initial/Assessment Note    Patient Details  Name: Sheena Smith MRN: [id number] Date of Birth: 22-Jun-1951  Transition of Care [facility]) CM/SW Contact:    Tom-Johnson, Renea Ee, RN Phone Number: 10/22/2022, 2:04 PM  Clinical Narrative:                  CM spoke with patient and daughter, Camisha at bedside about needs for discharge transition.  Admitted for Acute Peritonitis. On IV abx. Patient receives Peritoneal Dialysis at home from [facility] in [place]. Per Nephrology, patient's PD catheter not functioning, converting to HD Catheter. Non-tunneled HD catheter placed on 12/18 and patient started on Hemodialysis. Plan to remove PD cath by Surgery.  Patient noted to be confused on assessment. Oriented to self at this time. Camisha states she is happy patient is able to say few words today.  Patient is from home alone. Independent with care and drive self prior to admission. Has three supportive children. Patient helps babysit her grandchildren. Does not have DME's at home.  PCP is [name], MD [facility] [address].  Camisha states she and her siblings will transport patient to and from outpatient dialysis at discharge.  Patient has Medicaid. Copy of Medicaid placed on patient's chart and Admissions notified.  CM will continue to follow as patient progresses with care towards discharge.        Patient Goals and CMS Choice            Expected Discharge Plan and Services                                                Prior Living Arrangements/Services                       Activities of Daily Living Home Assistive Devices/Equipment: Shower chair without back ADL Screening (condition at time of admission) Patient's cognitive ability adequate to safely complete daily activities?: Yes Is the patient deaf or have difficulty hearing?: No Does the patient have difficulty seeing, even  when wearing glasses/contacts?: No Does the patient have difficulty concentrating, remembering, or making decisions?: No Patient able to express need for assistance with ADLs?: Yes Does the patient have difficulty dressing or bathing?: No Independently performs ADLs?: Yes (appropriate for developmental age) Does the patient have difficulty walking or climbing stairs?: No Weakness of Legs: None Weakness of Arms/Hands: None  Permission Sought/Granted                  Emotional Assessment              Admission diagnosis:  Acute peritonitis ([place]) [K65.0] Peritonitis ([place]) [K65.9] Patient Active Problem List   Diagnosis Date Noted   Peritonitis ([place]) 10/17/2022   Acute peritonitis ([place]) 10/16/2022   Sepsis ([place]) 10/16/2022   ESRD on peritoneal dialysis ([place]) 72/53/6644   Metabolic acidosis with increased anion gap and accumulation of organic acids 10/16/2022   Anemia due to chronic kidney disease 10/16/2022   Essential hypertension 10/16/2022   Lupus ([place]) 10/16/2022   GERD (gastroesophageal reflux disease) 10/16/2022   PCP:  [name], MD Pharmacy:   [id number] [place]Phone: [phone]: [phone] [id number] [place] [place] [address] Phone: [phone] Fax: [phone]     Social Determinants of Health (SDOH) Social History: SDOH Screenings   Food Insecurity: No Food Insecurity (10/17/2022)  Housing: Low Risk  (10/17/2022)  Transportation Needs: No Transportation Needs (10/17/2022)  Utilities: Not At Risk (10/17/2022)  Tobacco Use: Medium Risk (10/21/2022)   SDOH Interventions: Transportation Interventions: Intervention Not Indicated, Inpatient TOC, Patient Resources (Friends/Family)   Readmission Risk Interventions     No data to display

## 2022-10-22 NOTE — Progress Notes (Signed)
[facility] Progress Note   Assessment/ Plan:   Polymicrobial PD Peritonitis; OP cultures showing  Klebsiella, Citrobacter, enterobacter R to cefazolin but otherwise pan-sensitive.  Will do CT scan for possible abscess and then PD cath will need to come out- d/w pt.  PD cath not functioning anyway so will convert to hemo.  D/w pt- disappointed but in agreement.  Case postponed 12/19d/t AMS- CT head negative, AAO x 3 today, appears ready for OR  PD cath malfunction,-- needs to come out anyway ESRD on PD HTN on CCB, MRB, ARB as outpt Hx/o SLE Anemia, Hb 7s  Subjective:    CT head negative.  Feeling well this AM, awake and alert.  Tried for HD this AM but catheter not working well, cathflo in place.     Objective:   BP (!) 140/82 (BP Location: Left Arm)   Pulse 99   Temp 98.5 F (36.9 C) (Oral)   Resp (!) 24   Ht '5\' 2"'$  (1.575 m)   Wt 51.4 kg   SpO2 100%   BMI 20.73 kg/m   Physical Exam: Gen:NAD, lying in bed CVS: RRR Resp: clear Abd: + PD catheter, somewhat tender Ext: no LE edema ACCESS: nontunneled HD catheter (R), PD catheter  Labs: BMET Recent Labs  Lab 10/16/22 0031 10/18/22 0440 10/19/22 0316 10/21/22 0326 10/21/22 1301 10/21/22 1548  NA 133* 132* 131* 133* 134* 135  K 3.6 4.2 4.2 5.5* 3.3* 3.5  CL 96* 95* 95* 96* 97* 95*  CO2 19* 15* 17* 12*  --  20*  GLUCOSE 122* 107* 113* 55* 48* 102*  BUN 75* 97* 100* 125* 38* 49*  CREATININE 13.01* 15.19* 15.19* 17.20* 7.50* 8.64*  CALCIUM 7.5* 6.9* 6.9* 7.5*  --  7.6*   CBC Recent Labs  Lab 10/16/22 0031 10/17/22 0605 10/18/22 0440 10/19/22 0316 10/20/22 0407 10/21/22 0326 10/21/22 1301  WBC 2.7*   < > 5.4 8.1 15.9* 15.1*  --   NEUTROABS 2.0  --   --   --   --  14.2*  --   HGB 10.0*   < > 8.6* 8.4* 8.1* 7.7* 8.2*  HCT 32.3*   < > 26.6* 25.8* 25.0* 24.3* 24.0*  MCV 87.3   < > 83.4 83.2 83.1 84.4  --   PLT 381   < > 391 407* 422* 409*  --    < > = values in this interval not displayed.       Medications:     amLODipine  10 mg Oral Daily   Chlorhexidine Gluconate Cloth  6 each Topical Daily   furosemide  80 mg Oral BID   gentamicin cream  1 Application Topical Daily   heparin injection (subcutaneous)  5,000 Units Subcutaneous Q8H   hydroxychloroquine  200 mg Oral QODAY   irbesartan  300 mg Oral QPM   melatonin  3 mg Oral QHS   pantoprazole  40 mg Oral BID AC   propranolol  40 mg Oral QID   sodium chloride flush  3 mL Intravenous Q12H   spironolactone  25 mg Oral Daily     Madelon Lips MD 10/22/2022, 11:23 AM

## 2022-10-23 ENCOUNTER — Other Ambulatory Visit: Payer: Self-pay

## 2022-10-23 ENCOUNTER — Inpatient Hospital Stay (HOSPITAL_COMMUNITY): Payer: Medicare Other | Admitting: Certified Registered Nurse Anesthetist

## 2022-10-23 ENCOUNTER — Encounter (HOSPITAL_COMMUNITY): Payer: Self-pay | Admitting: Internal Medicine

## 2022-10-23 ENCOUNTER — Encounter (HOSPITAL_COMMUNITY)
Admission: EM | Disposition: A | Discharge status: Skilled Nursing Facility | Payer: Self-pay | Source: Home / Self Care | Attending: Family Medicine

## 2022-10-23 DIAGNOSIS — Z992 Dependence on renal dialysis: Secondary | ICD-10-CM

## 2022-10-23 DIAGNOSIS — I10 Essential (primary) hypertension: Secondary | ICD-10-CM | POA: Diagnosis not present

## 2022-10-23 DIAGNOSIS — I120 Hypertensive chronic kidney disease with stage 5 chronic kidney disease or end stage renal disease: Secondary | ICD-10-CM | POA: Diagnosis not present

## 2022-10-23 DIAGNOSIS — K650 Generalized (acute) peritonitis: Secondary | ICD-10-CM | POA: Diagnosis not present

## 2022-10-23 DIAGNOSIS — T8571XA Infection and inflammatory reaction due to peritoneal dialysis catheter, initial encounter: Secondary | ICD-10-CM | POA: Diagnosis not present

## 2022-10-23 DIAGNOSIS — N186 End stage renal disease: Secondary | ICD-10-CM | POA: Diagnosis not present

## 2022-10-23 DIAGNOSIS — E872 Acidosis, unspecified: Secondary | ICD-10-CM | POA: Diagnosis not present

## 2022-10-23 DIAGNOSIS — Z87891 Personal history of nicotine dependence: Secondary | ICD-10-CM

## 2022-10-23 DIAGNOSIS — D631 Anemia in chronic kidney disease: Secondary | ICD-10-CM

## 2022-10-23 HISTORY — PX: REMOVAL OF A DIALYSIS CATHETER: SHX6053

## 2022-10-23 LAB — BASIC METABOLIC PANEL
Anion gap: 15 (ref 5–15)
BUN: 51 mg/dL — ABNORMAL HIGH (ref 8–23)
CO2: 23 mmol/L (ref 22–32)
Calcium: 6.8 mg/dL — ABNORMAL LOW (ref 8.9–10.3)
Chloride: 99 mmol/L (ref 98–111)
Creatinine, Ser: 8.33 mg/dL — ABNORMAL HIGH (ref 0.44–1.00)
GFR, Estimated: 5 mL/min — ABNORMAL LOW (ref >60)
Glucose, Bld: 85 mg/dL (ref 70–99)
Potassium: 3.8 mmol/L (ref 3.5–5.1)
Sodium: 137 mmol/L (ref 135–145)

## 2022-10-23 LAB — POCT I-STAT, CHEM 8
BUN: 45 mg/dL — ABNORMAL HIGH (ref 8–23)
Calcium, Ion: 0.81 mmol/L — CL (ref 1.15–1.40)
Chloride: 98 mmol/L (ref 98–111)
Creatinine, Ser: 8.3 mg/dL — ABNORMAL HIGH (ref 0.44–1.00)
Glucose, Bld: 94 mg/dL (ref 70–99)
HCT: 27 % — ABNORMAL LOW (ref 36.0–46.0)
Hemoglobin: 9.2 g/dL — ABNORMAL LOW (ref 12.0–15.0)
Potassium: 4 mmol/L (ref 3.5–5.1)
Sodium: 135 mmol/L (ref 135–145)
TCO2: 24 mmol/L (ref 22–32)

## 2022-10-23 LAB — HEMOGLOBIN AND HEMATOCRIT, BLOOD
HCT: 26.5 % — ABNORMAL LOW (ref 36.0–46.0)
Hemoglobin: 8.4 g/dL — ABNORMAL LOW (ref 12.0–15.0)

## 2022-10-23 LAB — PREPARE RBC (CROSSMATCH)

## 2022-10-23 LAB — CBC
HCT: 22.6 % — ABNORMAL LOW (ref 36.0–46.0)
Hemoglobin: 6.9 g/dL — CL (ref 12.0–15.0)
MCH: 26.3 pg (ref 26.0–34.0)
MCHC: 30.5 g/dL (ref 30.0–36.0)
MCV: 86.3 fL (ref 80.0–100.0)
Platelets: 278 10*3/uL (ref 150–400)
RBC: 2.62 MIL/uL — ABNORMAL LOW (ref 3.87–5.11)
RDW: 17.1 % — ABNORMAL HIGH (ref 11.5–15.5)
WBC: 11.1 10*3/uL — ABNORMAL HIGH (ref 4.0–10.5)
nRBC: 0.3 % — ABNORMAL HIGH (ref 0.0–0.2)

## 2022-10-23 LAB — LACTIC ACID, PLASMA: Lactic Acid, Venous: 0.8 mmol/L (ref 0.5–1.9)

## 2022-10-23 LAB — PROCALCITONIN: Procalcitonin: 23.35 ng/mL

## 2022-10-23 LAB — GLUCOSE, CAPILLARY: Glucose-Capillary: 82 mg/dL (ref 70–99)

## 2022-10-23 SURGERY — REMOVAL, DIALYSIS CATHETER
Anesthesia: General | Site: Abdomen

## 2022-10-23 MED ORDER — ROCURONIUM BROMIDE 10 MG/ML (PF) SYRINGE
PREFILLED_SYRINGE | INTRAVENOUS | Status: AC
  Filled 2022-10-23: qty 10

## 2022-10-23 MED ORDER — ACETAMINOPHEN 500 MG PO TABS
ORAL_TABLET | ORAL | Status: AC
  Filled 2022-10-23: qty 2

## 2022-10-23 MED ORDER — LIDOCAINE 2% (20 MG/ML) 5 ML SYRINGE
INTRAMUSCULAR | Status: AC
  Filled 2022-10-23: qty 5

## 2022-10-23 MED ORDER — ONDANSETRON HCL 4 MG/2ML IJ SOLN: 4.0000 mg | Freq: Once | INTRAMUSCULAR | Status: DC | PRN

## 2022-10-23 MED ORDER — BUPIVACAINE-EPINEPHRINE (PF) 0.5% -1:200000 IJ SOLN
INTRAMUSCULAR | Status: AC
  Filled 2022-10-23: qty 30

## 2022-10-23 MED ORDER — FENTANYL CITRATE (PF) 250 MCG/5ML IJ SOLN
INTRAMUSCULAR | Status: DC | PRN
  Administered 2022-10-23: 100 ug via INTRAVENOUS

## 2022-10-23 MED ORDER — PHENYLEPHRINE 80 MCG/ML (10ML) SYRINGE FOR IV PUSH (FOR BLOOD PRESSURE SUPPORT)
PREFILLED_SYRINGE | INTRAVENOUS | Status: DC | PRN
  Administered 2022-10-23: 160 ug via INTRAVENOUS

## 2022-10-23 MED ORDER — VANCOMYCIN HCL IN DEXTROSE 1-5 GM/200ML-% IV SOLN
1000.0000 mg | Freq: Once | INTRAVENOUS | Status: AC
  Administered 2022-10-23: 1000 mg via INTRAVENOUS
  Filled 2022-10-23: qty 200

## 2022-10-23 MED ORDER — BUPIVACAINE-EPINEPHRINE (PF) 0.5% -1:200000 IJ SOLN
INTRAMUSCULAR | Status: DC | PRN
  Administered 2022-10-23: 12 mL

## 2022-10-23 MED ORDER — ROCURONIUM BROMIDE 10 MG/ML (PF) SYRINGE
PREFILLED_SYRINGE | INTRAVENOUS | Status: DC | PRN
  Administered 2022-10-23: 70 mg via INTRAVENOUS

## 2022-10-23 MED ORDER — AMISULPRIDE (ANTIEMETIC) 5 MG/2ML IV SOLN: 10.0000 mg | Freq: Once | INTRAVENOUS | Status: DC | PRN

## 2022-10-23 MED ORDER — FENTANYL CITRATE (PF) 250 MCG/5ML IJ SOLN
INTRAMUSCULAR | Status: AC
  Filled 2022-10-23: qty 5

## 2022-10-23 MED ORDER — PHENYLEPHRINE HCL-NACL 20-0.9 MG/250ML-% IV SOLN
INTRAVENOUS | Status: DC | PRN
  Administered 2022-10-23: 100 ug/min via INTRAVENOUS

## 2022-10-23 MED ORDER — PROPOFOL 10 MG/ML IV BOLUS
INTRAVENOUS | Status: AC
  Filled 2022-10-23: qty 20

## 2022-10-23 MED ORDER — PROPOFOL 10 MG/ML IV BOLUS
INTRAVENOUS | Status: DC | PRN
  Administered 2022-10-23: 130 mg via INTRAVENOUS

## 2022-10-23 MED ORDER — DEXAMETHASONE SODIUM PHOSPHATE 10 MG/ML IJ SOLN
INTRAMUSCULAR | Status: AC
  Filled 2022-10-23: qty 1

## 2022-10-23 MED ORDER — FENTANYL CITRATE (PF) 100 MCG/2ML IJ SOLN: 25.0000 ug | INTRAMUSCULAR | Status: DC | PRN

## 2022-10-23 MED ORDER — LIDOCAINE 2% (20 MG/ML) 5 ML SYRINGE
INTRAMUSCULAR | Status: DC | PRN
  Administered 2022-10-23: 40 mg via INTRAVENOUS

## 2022-10-23 MED ORDER — DARBEPOETIN ALFA 40 MCG/0.4ML IJ SOSY: 40.0000 ug | PREFILLED_SYRINGE | INTRAMUSCULAR | Status: DC

## 2022-10-23 MED ORDER — HEPARIN SODIUM (PORCINE) 1000 UNIT/ML IJ SOLN
INTRAMUSCULAR | Status: AC
  Administered 2022-10-23: 2600 [IU]
  Filled 2022-10-23: qty 3

## 2022-10-23 MED ORDER — SODIUM CHLORIDE 0.9% IV SOLUTION: Freq: Once | INTRAVENOUS | Status: AC

## 2022-10-23 MED ORDER — VANCOMYCIN VARIABLE DOSE PER UNSTABLE RENAL FUNCTION (PHARMACIST DOSING): Status: DC

## 2022-10-23 MED ORDER — ACETAMINOPHEN 500 MG PO TABS: 1000.0000 mg | ORAL_TABLET | Freq: Once | ORAL | Status: DC

## 2022-10-23 MED ORDER — ONDANSETRON HCL 4 MG/2ML IJ SOLN
INTRAMUSCULAR | Status: DC | PRN
  Administered 2022-10-23: 4 mg via INTRAVENOUS

## 2022-10-23 MED ORDER — 0.9 % SODIUM CHLORIDE (POUR BTL) OPTIME
TOPICAL | Status: DC | PRN
  Administered 2022-10-23: 1000 mL

## 2022-10-23 MED ORDER — VANCOMYCIN HCL 500 MG/100ML IV SOLN
500.0000 mg | Freq: Once | INTRAVENOUS | Status: AC
  Administered 2022-10-23: 500 mg via INTRAVENOUS
  Filled 2022-10-23: qty 100

## 2022-10-23 MED ORDER — OXYCODONE HCL 5 MG PO TABS: 5.0000 mg | ORAL_TABLET | Freq: Once | ORAL | Status: DC | PRN

## 2022-10-23 MED ORDER — SUGAMMADEX SODIUM 200 MG/2ML IV SOLN
INTRAVENOUS | Status: DC | PRN
  Administered 2022-10-23: 200 mg via INTRAVENOUS

## 2022-10-23 MED ORDER — CHLORHEXIDINE GLUCONATE 0.12 % MT SOLN
OROMUCOSAL | Status: AC
  Administered 2022-10-23: 15 mL via OROMUCOSAL
  Filled 2022-10-23: qty 15

## 2022-10-23 MED ORDER — ONDANSETRON HCL 4 MG/2ML IJ SOLN
INTRAMUSCULAR | Status: AC
  Filled 2022-10-23: qty 2

## 2022-10-23 MED ORDER — OXYCODONE HCL 5 MG/5ML PO SOLN: 5.0000 mg | Freq: Once | ORAL | Status: DC | PRN

## 2022-10-23 SURGICAL SUPPLY — 32 items
BAG COUNTER SPONGE SURGICOUNT (BAG) ×1 IMPLANT
BNDG GAUZE DERMACEA FLUFF 4 (GAUZE/BANDAGES/DRESSINGS) IMPLANT
CANISTER SUCT 3000ML PPV (MISCELLANEOUS) ×1 IMPLANT
COVER SURGICAL LIGHT HANDLE (MISCELLANEOUS) ×1 IMPLANT
DERMABOND ADVANCED .7 DNX12 (GAUZE/BANDAGES/DRESSINGS) IMPLANT
DRAPE LAPAROSCOPIC ABDOMINAL (DRAPES) IMPLANT
ELECT REM PT RETURN 9FT ADLT (ELECTROSURGICAL) ×1
ELECTRODE REM PT RTRN 9FT ADLT (ELECTROSURGICAL) ×1 IMPLANT
GAUZE PAD ABD 8X10 STRL (GAUZE/BANDAGES/DRESSINGS) IMPLANT
GAUZE SPONGE 4X4 12PLY STRL (GAUZE/BANDAGES/DRESSINGS) IMPLANT
GLOVE BIO SURGEON STRL SZ7.5 (GLOVE) ×1 IMPLANT
GLOVE INDICATOR 8.0 STRL GRN (GLOVE) ×1 IMPLANT
GOWN STRL REUS W/ TWL LRG LVL3 (GOWN DISPOSABLE) ×1 IMPLANT
GOWN STRL REUS W/ TWL XL LVL3 (GOWN DISPOSABLE) ×1 IMPLANT
GOWN STRL REUS W/TWL LRG LVL3 (GOWN DISPOSABLE) ×1
GOWN STRL REUS W/TWL XL LVL3 (GOWN DISPOSABLE) ×1
KIT BASIN OR (CUSTOM PROCEDURE TRAY) ×1 IMPLANT
KIT TURNOVER KIT B (KITS) ×1 IMPLANT
NDL HYPO 25GX1X1/2 BEV (NEEDLE) IMPLANT
NEEDLE HYPO 25GX1X1/2 BEV (NEEDLE) ×1 IMPLANT
NS IRRIG 1000ML POUR BTL (IV SOLUTION) ×1 IMPLANT
PACK GENERAL/GYN (CUSTOM PROCEDURE TRAY) ×1 IMPLANT
PAD ARMBOARD 7.5X6 YLW CONV (MISCELLANEOUS) ×1 IMPLANT
PENCIL SMOKE EVACUATOR (MISCELLANEOUS) ×1 IMPLANT
SUT MNCRL AB 4-0 PS2 18 (SUTURE) IMPLANT
SUT VIC AB 2-0 SH 27 (SUTURE) ×1
SUT VIC AB 2-0 SH 27XBRD (SUTURE) IMPLANT
SWAB COLLECTION DEVICE MRSA (MISCELLANEOUS) IMPLANT
SWAB CULTURE ESWAB REG 1ML (MISCELLANEOUS) IMPLANT
SYR CONTROL 10ML LL (SYRINGE) IMPLANT
TOWEL GREEN STERILE (TOWEL DISPOSABLE) ×1 IMPLANT
TOWEL GREEN STERILE FF (TOWEL DISPOSABLE) ×1 IMPLANT

## 2022-10-23 NOTE — Progress Notes (Signed)
Patient has anemia of chronic disease and hemoglobin was 8.2 on labs done 2 days ago.  This morning hemoglobin down to 6.9.  Patient is asymptomatic and no signs of active bleeding. Type and screen done 12/19. 1 unit PRBCs ordered after obtaining consent from the patient.  Follow-up posttransfusion H&H.

## 2022-10-23 NOTE — Op Note (Signed)
10/23/2022  10:22 AM  PATIENT:  Sheena Smith  71 y.o. female  Patient Care Team: [name], MD as PCP - General (Family Medicine)  PRE-OPERATIVE DIAGNOSIS:  Infected peritoneal dialysis catheter  POST-OPERATIVE DIAGNOSIS:  Same  PROCEDURE:  Removal of peritoneal dialysis catheter  SURGEON:  Sharon Mt. Lylie Blacklock, MD  ANESTHESIA:   local and general  COUNTS:  Sponge, needle and instrument counts were reported correct x2 at the conclusion of the operation.  EBL: 2 mL  DRAINS: None  SPECIMEN: Cultures of peritoneal dialysis catheter tip  COMPLICATIONS: none  FINDINGS: Peritoneal dialysis catheter removed uneventfully with intact appearing tip.  Cultures of the tip were obtained.  DISPOSITION: PACU in satisfactory condition  DESCRIPTION: The patient was identified in preop holding and taken to the OR where she was placed on the operating room table. SCDs were placed. General endotracheal anesthesia was induced without difficulty. She was then prepped and draped in the usual sterile fashion. A surgical timeout was performed indicating the correct patient, procedure, positioning and need for preoperative antibiotics.   The tract is palpable underneath the skin entering the abdomen just to the left of her umbilicus.  The skin at the exit is incised and the first cuff is encountered.  The scar is freed approximately.  Incision is then made just to the left of the umbilicus where the catheter enters the peritoneal cavity.  The second cuff was encountered at this location within the subcutaneous tissue overlying the rectus sheath.  This also circumferentially freed.  In doing so, the catheter was able to be fully removed.  We did cut the catheter and asked that the second cuff did have to pass to the long subcutaneous tract.  The tip removed intact.  Cultures were then obtained.  Both pieces of the catheter were discarded.  Wounds and irrigated.  2-0 Vicryl figure-of-eight  suture was used to close the tract or went through the rectus sheath. All sponge, needle, and instrument counts were reported correct.  The skin of all incision sites closed with 4-0 Monocryl subcuticular suture. Wounds are washed and dried. Dermabond applied to paraumbilical site and a 2R4/YHCW applied to the lateral incision site.  She was awake from anesthesia, transferred to a stretcher and transported to recovery in satisfactory condition.

## 2022-10-23 NOTE — Progress Notes (Signed)
Pharmacy Antibiotic Note  Sheena Smith is a 71 y.o. female admitted on 10/15/2022 with peritonitis and treated w/ cefepime, now spiking fevers with tachycardia >> concern for sepsis.  Pharmacy has been consulted to broaden ABX with vancomycin.  Of note pt is ESRD on PD, PD cath not functioning and plan to remove for peritonitis anyway, plan to transition to HD but so far has not had any treatments, supposed to go for HD cath today.  Plan: Vancomycin '1000mg'$  IV x1; will f/u PD vs HD and dose based on levels vs HD dosing if tolerating.  Height: '5\' 2"'$  (157.5 cm) Weight: 51.3 kg (113 lb 1.5 oz) IBW/kg (Calculated) : 50.1  Temp (24hrs), Avg:99.3 F (37.4 C), Min:98.3 F (36.8 C), Max:100.5 F (38.1 C)  Recent Labs  Lab 10/16/22 0438 10/17/22 0605 10/18/22 0440 10/19/22 0316 10/20/22 0407 10/21/22 0326 10/21/22 1301 10/21/22 1548  WBC  --  7.3 5.4 8.1 15.9* 15.1*  --   --   CREATININE  --   --  15.19* 15.19*  --  17.20* 7.50* 8.64*  LATICACIDVEN 1.4  --   --   --   --   --   --   --   VANCORANDOM  --  43  --   --   --   --   --   --     Estimated Creatinine Clearance: 4.9 mL/min (A) (by C-G formula based on SCr of 8.64 mg/dL (H)).    Allergies  Allergen Reactions   Nsaids     "Kidney Disease. Exception to this NSAID intolerance is aspirin 81-'325mg'$  daily"'   Other     Per pt, states that she was informed she was allergic to sulfa after allergy testing  "Kidney Disease. Exception to this NSAID intolerance is aspirin 81-'325mg'$  daily"'   Atenolol     palpitation   Elemental Sulfur     Thank you for allowing pharmacy to be a part of this patient's care.  Wynona Neat, PharmD, BCPS  10/23/2022 2:27 AM

## 2022-10-23 NOTE — Transfer of Care (Signed)
Immediate Anesthesia Transfer of Care Note  Patient: Sheena Smith  Procedure(s) Performed: REMOVAL OF A  PERITONEAL DIALYSIS CATHETER (Abdomen)  Patient Location: PACU  Anesthesia Type:General  Level of Consciousness: drowsy  Airway & Oxygen Therapy: Patient Spontanous Breathing and Patient connected to face mask oxygen  Post-op Assessment: Report given to RN and Post -op Vital signs reviewed and stable  Post vital signs: Reviewed and stable  Last Vitals:  Vitals Value Taken Time  BP 136/79 10/23/22 1034  Temp    Pulse 98 10/23/22 1038  Resp 29 10/23/22 1038  SpO2 99 % 10/23/22 1038  Vitals shown include unvalidated device data.  Last Pain:  Vitals:   10/23/22 0825  TempSrc: Oral  PainSc:          Complications: No notable events documented.

## 2022-10-23 NOTE — Anesthesia Postprocedure Evaluation (Signed)
Anesthesia Post Note  Patient: Sheena Smith  Procedure(s) Performed: REMOVAL OF A  PERITONEAL DIALYSIS CATHETER (Abdomen)     Patient location during evaluation: PACU Anesthesia Type: General Level of consciousness: awake and alert Pain management: pain level controlled Vital Signs Assessment: post-procedure vital signs reviewed and stable Respiratory status: spontaneous breathing, nonlabored ventilation and respiratory function stable Cardiovascular status: blood pressure returned to baseline and stable Postop Assessment: no apparent nausea or vomiting Anesthetic complications: no   No notable events documented.  Last Vitals:  Vitals:   10/23/22 1115 10/23/22 1142  BP: 132/78 (!) 141/80  Pulse: (!) 101 (!) 102  Resp: (!) 26 18  Temp: 37.3 C 36.7 C  SpO2: 96% 100%    Last Pain:  Vitals:   10/23/22 1115  TempSrc:   PainSc: 0-No pain                 Lidia Collum

## 2022-10-23 NOTE — Progress Notes (Signed)
Overnight progress note  71 year old female with history of hypertension, ESRD on PD, lupus admitted for Klebsiella pneumoniae peritonitis.  Body fluid culture significant for ampicillin resistant Klebsiella pneumonia.  Blood cultures were negative.  She is currently on cefepime.  Overnight, she has developed fever with temperature 100.5 F and heart rate in the 110s.  Blood pressure stable.  -Repeat blood cultures ordered -Continue cefepime and add on vancomycin -Repeat CBC, check lactate and procalcitonin -Tylenol as needed for fevers -Consult ID in am

## 2022-10-23 NOTE — Progress Notes (Signed)
Patient returned from the OR. PD cath removed. Dressing to abdomen, clean, dry and intact. Patient sleepy. VSS. Call light within reach.

## 2022-10-23 NOTE — Progress Notes (Addendum)
PROGRESS NOTE    Sheena Smith  HYI:[id number] DOB: [birth year] DOA: 10/15/2022 PCP: [name], MD   Brief Narrative: Sheena Smith is a 71 y.o. female with a history of hypertension, ESRD on PD and lupus. Patient presented secondary to abdominal pain and was found to have evidence of acute peritonitis. Patient managed with IV antibiotics and transitioned to hemodialysis.   Assessment and Plan:  Klebsiella pneumoniae peritonitis In setting of peritoneal dialysis history. Patient started on empiric Vancomycin/Cefepime. Body fluid culture significant for ampicillin resistant klebsiella pneumoniae infection. Blood cultures negative. Recurrent fever overnight on 12/20. Patient is being taken for surgery to remove catheter this morning -Continue Cefepime/Vancomycin -Follow-up repeat blood cultures (12/20)  ESRD on peritoneal dialysis Nephrology consulted. Patient transitioning to hemodialysis. IR consulted for dialysis catheter placement which was performed on 12/18. Hemodialysis started on 12/18. Plan for removal of peritoneal dialysis catheter per general surgery on 12/20.  Acute metabolic/toxic encephalopathy Possibly related to uremia vs Cefepime vs both. CT head unremarkable for acute process. Mental status significantly improved 12/20 and appears near/at baseline. Since mental status improved with HD even while on Cefepime, Cefepime not discontinued.  Leukocytosis Unclear reason for significant elevation. Patient previously leukopenic. Leukocytosis slightly improved, however patient had a fever overnight. -CBC in AM  Metabolic acidosis with increased anion gap Secondary to renal failure. Management with HD.  Anemia of chronic kidney disease Hemoglobin dropped to 6.9 this morning. No obvious evidence of bleeding. 1 unit of PRBC given with post-transfusion hemoglobin of 9.2. -CBC in AM  Primary hypertension -Continue amlodipine, irbesartan, spironolactone and  lasix  Lupus -Continue Plaquenil  GERD CT abdomen/pelvis significant for distal esophageal thickening which may be consistent with esophagitis. Recommendation for upper endoscopy. Patient appears to be asymptomatic.   DVT prophylaxis: Heparin subq Code Status:   Code Status: Full Code Family Communication: None at bedside. Daughter on telephone (7 minutes) Disposition Plan: Discharge pending continued specialist recommendations for discharge   Consultants:  Nephrology General surgery  Procedures:  Hemodialysis  Antimicrobials: Vancomycin IV Cefepime IV   Subjective: Patient reports improvement in abdominal pain. No other concerns today.  Objective: BP 135/84 (BP Location: Left Arm)   Pulse (!) 103   Temp 98.2 F (36.8 C) (Oral)   Resp 19   Ht '5\' 2"'$  (1.575 m)   Wt 51.3 kg   SpO2 97%   BMI 20.69 kg/m   Examination:  General exam: Appears calm and comfortable Respiratory system: Clear to auscultation. Respiratory effort normal. Cardiovascular system: S1 & S2 heard, RRR. No murmurs, rubs, gallops or clicks. Gastrointestinal system: Abdomen is distended, soft and non-tender. Normal bowel sounds heard. Central nervous system: Alert and oriented to person and place. No focal neurological deficits. Musculoskeletal: No edema. No calf tenderness Skin: No cyanosis. No rashes   Data Reviewed: I have personally reviewed following labs and imaging studies  CBC Lab Results  Component Value Date   WBC 11.1 (H) 10/23/2022   RBC 2.62 (L) 10/23/2022   HGB 9.2 (L) 10/23/2022   HCT 27.0 (L) 10/23/2022   MCV 86.3 10/23/2022   MCH 26.3 10/23/2022   PLT 278 10/23/2022   MCHC 30.5 10/23/2022   RDW 17.1 (H) 10/23/2022   LYMPHSABS 0.3 (L) 10/21/2022   MONOABS 0.6 10/21/2022   EOSABS 0.0 10/21/2022   BASOSABS 0.0 78/67/6720     Last metabolic panel Lab Results  Component Value Date   NA 135 10/23/2022   K 4.0 10/23/2022   CL 98  10/23/2022   CO2 20 (L) 10/21/2022    BUN 45 (H) 10/23/2022   CREATININE 8.30 (H) 10/23/2022   GLUCOSE 94 10/23/2022   GFRNONAA 5 (L) 10/21/2022   GFRAA 5 (L) 05/21/2020   CALCIUM 7.6 (L) 10/21/2022   PROT 6.8 10/16/2022   ALBUMIN 2.0 (L) 10/16/2022   LABGLOB 3.0 05/21/2020   AGRATIO 1.1 (L) 05/21/2020   BILITOT 2.3 (H) 10/16/2022   ALKPHOS 48 10/16/2022   AST 15 10/16/2022   ALT 10 10/16/2022   ANIONGAP 20 (H) 10/21/2022    GFR: Estimated Creatinine Clearance: 5.1 mL/min (A) (by C-G formula based on SCr of 8.3 mg/dL (H)).  Recent Results (from the past 240 hour(s))  Blood Culture (routine x 2)     Status: None   Collection Time: 10/16/22  4:13 AM   Specimen: BLOOD LEFT FOREARM  Result Value Ref Range Status   Specimen Description BLOOD LEFT FOREARM  Final   Special Requests   Final    BOTTLES DRAWN AEROBIC AND ANAEROBIC Blood Culture adequate volume   Culture   Final    NO GROWTH 5 DAYS Performed at [facility], [address] [place], [place] [address]    Report Status 10/21/2022 FINAL  Final  Resp panel by RT-PCR (RSV, Flu A&B, Covid) Anterior Nasal Swab     Status: None   Collection Time: 10/16/22  4:38 AM   Specimen: Anterior Nasal Swab  Result Value Ref Range Status   SARS Coronavirus 2 by RT PCR NEGATIVE NEGATIVE Final    Comment: (NOTE) SARS-CoV-2 target nucleic acids are NOT DETECTED.  The SARS-CoV-2 RNA is generally detectable in upper respiratory specimens during the acute phase of infection. The lowest concentration of SARS-CoV-2 viral copies this assay can detect is 138 copies/mL. A negative result does not preclude SARS-Cov-2 infection and should not be used as the sole basis for treatment or other patient management decisions. A negative result may occur with  improper specimen collection/handling, submission of specimen other than nasopharyngeal swab, presence of viral mutation(s) within the areas targeted by this assay, and inadequate number of viral copies(<138 copies/mL). A  negative result must be combined with clinical observations, patient history, and epidemiological information. The expected result is Negative.  Fact Sheet for Patients:  EntrepreneurPulse.com.au  Fact Sheet for Healthcare Providers:  IncredibleEmployment.be  This test is no t yet approved or cleared by the [place] FDA and  has been authorized for detection and/or diagnosis of SARS-CoV-2 by FDA under an Emergency Use Authorization (EUA). This EUA will remain  in effect (meaning this test can be used) for the duration of the COVID-19 declaration under Section 564(b)(1) of the Act, 21 U.S.C.section 360bbb-3(b)(1), unless the authorization is terminated  or revoked sooner.       Influenza A by PCR NEGATIVE NEGATIVE Final   Influenza B by PCR NEGATIVE NEGATIVE Final    Comment: (NOTE) The Xpert Xpress SARS-CoV-2/FLU/RSV plus assay is intended as an aid in the diagnosis of influenza from Nasopharyngeal swab specimens and should not be used as a sole basis for treatment. Nasal washings and aspirates are unacceptable for Xpert Xpress SARS-CoV-2/FLU/RSV testing.  Fact Sheet for Patients: EntrepreneurPulse.com.au  Fact Sheet for Healthcare Providers: IncredibleEmployment.be  This test is not yet approved or cleared by the [place] FDA and has been authorized for detection and/or diagnosis of SARS-CoV-2 by FDA under an Emergency Use Authorization (EUA). This EUA will remain in effect (meaning this test can be used) for  the duration of the COVID-19 declaration under Section 564(b)(1) of the Act, 21 U.S.C. section 360bbb-3(b)(1), unless the authorization is terminated or revoked.     Resp Syncytial Virus by PCR NEGATIVE NEGATIVE Final    Comment: (NOTE) Fact Sheet for Patients: EntrepreneurPulse.com.au  Fact Sheet for Healthcare  Providers: IncredibleEmployment.be  This test is not yet approved or cleared by the [place] FDA and has been authorized for detection and/or diagnosis of SARS-CoV-2 by FDA under an Emergency Use Authorization (EUA). This EUA will remain in effect (meaning this test can be used) for the duration of the COVID-19 declaration under Section 564(b)(1) of the Act, 21 U.S.C. section 360bbb-3(b)(1), unless the authorization is terminated or revoked.  Performed at [facility], [place] [address], [place], [place] [address]   Blood Culture (routine x 2)     Status: None   Collection Time: 10/16/22  5:00 AM   Specimen: BLOOD RIGHT FOREARM  Result Value Ref Range Status   Specimen Description BLOOD RIGHT FOREARM  Final   Special Requests   Final    BOTTLES DRAWN AEROBIC AND ANAEROBIC Blood Culture adequate volume   Culture   Final    NO GROWTH 5 DAYS Performed at [facility], [place] [address] [place], [place] [address]    Report Status 10/21/2022 FINAL  Final  Body fluid culture w Gram Stain     Status: None   Collection Time: 10/17/22  5:00 AM   Specimen: Body Fluid  Result Value Ref Range Status   Specimen Description FLUID  Final   Special Requests PERITONEAL FLUID  Final   Gram Stain   Final    WBC PRESENT, PREDOMINANTLY PMN GRAM NEGATIVE RODS CYTOSPIN SMEAR CRITICAL RESULT CALLED TO, READ BACK BY AND VERIFIED WITH: RN A.Philemon AT 2831 ON 10/19/2022 BY T.SAAD. Performed at [facility], [place] [address] [place], [place] [address]    Culture FEW KLEBSIELLA PNEUMONIAE  Final   Report Status 10/20/2022 FINAL  Final   Organism ID, Bacteria KLEBSIELLA PNEUMONIAE  Final      Susceptibility   Klebsiella pneumoniae - MIC*    AMPICILLIN >=32 RESISTANT Resistant     CEFAZOLIN <=4 SENSITIVE Sensitive     CEFEPIME <=0.12 SENSITIVE Sensitive     CEFTAZIDIME <=1 SENSITIVE Sensitive     CEFTRIAXONE <=0.25 SENSITIVE Sensitive     CIPROFLOXACIN <=0.25  SENSITIVE Sensitive     GENTAMICIN <=1 SENSITIVE Sensitive     IMIPENEM 0.5 SENSITIVE Sensitive     TRIMETH/SULFA <=20 SENSITIVE Sensitive     AMPICILLIN/SULBACTAM 4 SENSITIVE Sensitive     PIP/TAZO <=4 SENSITIVE Sensitive     * FEW KLEBSIELLA PNEUMONIAE      Radiology Studies: CT HEAD WO CONTRAST (5MM)  Result Date: 10/21/2022 CLINICAL DATA:  Mental status change, unknown cause EXAM: CT HEAD WITHOUT CONTRAST TECHNIQUE: Contiguous axial images were obtained from the base of the skull through the vertex without intravenous contrast. RADIATION DOSE REDUCTION: This exam was performed according to the departmental dose-optimization program which includes automated exposure control, adjustment of the mA and/or kV according to patient size and/or use of iterative reconstruction technique. COMPARISON:  None Available. FINDINGS: Brain: No evidence of acute infarction, hemorrhage, mass, mass effect, or midline shift. No hydrocephalus or extra-axial fluid collection. Encephalomalacia in left inferior frontal lobe, vertex left frontal lobe, and lateral right frontal and temporal lobe, likely sequela of remote infarcts. Periventricular white matter changes, likely the sequela of chronic small vessel  ischemic disease. Vascular: No hyperdense vessel. Skull: Normal. Negative for fracture or focal lesion. Sinuses/Orbits: No acute finding. Other: The mastoid air cells are well aerated. IMPRESSION: No acute intracranial process. Electronically Signed   By: Merilyn Baba M.D.   On: 10/21/2022 23:55      LOS: 7 days    Cordelia Poche, MD [facility] 10/23/2022, 9:23 AM   If 7PM-7AM, please contact night-coverage [email or web address]

## 2022-10-23 NOTE — Anesthesia Procedure Notes (Signed)
Procedure Name: Intubation Date/Time: 10/23/2022 9:51 AM  Performed by: Georgia Duff, CRNAPre-anesthesia Checklist: Patient identified, Emergency Drugs available, Suction available and Patient being monitored Patient Re-evaluated:Patient Re-evaluated prior to induction Oxygen Delivery Method: Circle System Utilized Preoxygenation: Pre-oxygenation with 100% oxygen Induction Type: IV induction Ventilation: Mask ventilation without difficulty Laryngoscope Size: Miller and 3 Grade View: Grade I Tube type: Oral Tube size: 7.0 mm Number of attempts: 1 Airway Equipment and Method: Stylet and Oral airway Placement Confirmation: ETT inserted through vocal cords under direct vision, positive ETCO2 and breath sounds checked- equal and bilateral Tube secured with: Tape Dental Injury: Teeth and Oropharynx as per pre-operative assessment

## 2022-10-23 NOTE — Progress Notes (Signed)
Blood transfusion finished at 08:25 o'clock; patient alert and oriented, no complaints; VS stable. Will continue to monitor.

## 2022-10-23 NOTE — Progress Notes (Signed)
Received patient in bed to unit.  Alert and oriented.  Informed consent signed and in chart.   Treatment initiated: [place] Treatment completed: 1907  Patient tolerated well.  Transported back to the room  Alert, without acute distress.  Hand-off given to patient's nurse.   Access used: catheter Access issues: believe its positional  Total UF removed: 0 Medication(s) given: none Post HD VS: 99.4kg, 147/84(103), HR-97, RR-18, HB71-69 Post HD weight: 58kg   [facility]

## 2022-10-23 NOTE — Progress Notes (Addendum)
[facility] Progress Note   Subjective:   PD catheter removed today. Has been having issues with hemodialysis catheter, plan is to try HD again today. Was febrile overnight and repeat blood cultures ordered, vancomycin added to cefepime. Pt reports cough but denies SOB, CP, palpitations, dizziness and nausea.  Objective Vitals:   10/23/22 1100 10/23/22 1115 10/23/22 1142 10/23/22 1203  BP: 128/80 132/78 (!) 141/80 133/77  Pulse: 100 (!) 101 (!) 102 (!) 104  Resp: (!) 21 (!) '26 18 20  '$ Temp:  99.1 F (37.3 C) 98 F (36.7 C) 99.4 F (37.4 C)  TempSrc:    Oral  SpO2: 96% 96% 100% 100%  Weight:      Height:       Physical Exam General: Alert female in NAD Heart: RRR, no murmurs, rubs or gallops Lungs: CTA bilaterally Abdomen: PD catheter removed, +BS Extremities: No edema b/l lower extremities Dialysis Access: nontunneled R IJ catheter in place  Additional Objective Labs: Basic Metabolic Panel: Recent Labs  Lab 10/19/22 0316 10/21/22 0326 10/21/22 1301 10/21/22 1548 10/23/22 0820  NA 131* 133* 134* 135 135  K 4.2 5.5* 3.3* 3.5 4.0  CL 95* 96* 97* 95* 98  CO2 17* 12*  --  20*  --   GLUCOSE 113* 55* 48* 102* 94  BUN 100* 125* 38* 49* 45*  CREATININE 15.19* 17.20* 7.50* 8.64* 8.30*  CALCIUM 6.9* 7.5*  --  7.6*  --    Liver Function Tests: No results for input(s): "AST", "ALT", "ALKPHOS", "BILITOT", "PROT", "ALBUMIN" in the last 168 hours. No results for input(s): "LIPASE", "AMYLASE" in the last 168 hours. CBC: Recent Labs  Lab 10/18/22 0440 10/19/22 0316 10/20/22 0407 10/21/22 0326 10/21/22 1301 10/23/22 0306 10/23/22 0820  WBC 5.4 8.1 15.9* 15.1*  --  11.1*  --   NEUTROABS  --   --   --  14.2*  --   --   --   HGB 8.6* 8.4* 8.1* 7.7* 8.2* 6.9* 9.2*  HCT 26.6* 25.8* 25.0* 24.3* 24.0* 22.6* 27.0*  MCV 83.4 83.2 83.1 84.4  --  86.3  --   PLT 391 407* 422* 409*  --  278  --    Blood Culture    Component Value Date/Time   SDES FLUID 10/17/2022  0500   SPECREQUEST PERITONEAL FLUID 10/17/2022 0500   CULT FEW KLEBSIELLA PNEUMONIAE 10/17/2022 0500   REPTSTATUS 10/20/2022 FINAL 10/17/2022 0500    Cardiac Enzymes: No results for input(s): "CKTOTAL", "CKMB", "CKMBINDEX", "TROPONINI" in the last 168 hours. CBG: Recent Labs  Lab 10/21/22 1328 10/21/22 1453 10/21/22 2011 10/22/22 1240 10/23/22 1217  GLUCAP 162* 111* 76 95 82   Iron Studies: No results for input(s): "IRON", "TIBC", "TRANSFERRIN", "FERRITIN" in the last 72 hours. '@lablastinr3'$ @ Studies/Results: CT HEAD WO CONTRAST (5MM)  Result Date: 10/21/2022 CLINICAL DATA:  Mental status change, unknown cause EXAM: CT HEAD WITHOUT CONTRAST TECHNIQUE: Contiguous axial images were obtained from the base of the skull through the vertex without intravenous contrast. RADIATION DOSE REDUCTION: This exam was performed according to the departmental dose-optimization program which includes automated exposure control, adjustment of the mA and/or kV according to patient size and/or use of iterative reconstruction technique. COMPARISON:  None Available. FINDINGS: Brain: No evidence of acute infarction, hemorrhage, mass, mass effect, or midline shift. No hydrocephalus or extra-axial fluid collection. Encephalomalacia in left inferior frontal lobe, vertex left frontal lobe, and lateral right frontal and temporal lobe, likely sequela of remote infarcts. Periventricular white matter changes,  likely the sequela of chronic small vessel ischemic disease. Vascular: No hyperdense vessel. Skull: Normal. Negative for fracture or focal lesion. Sinuses/Orbits: No acute finding. Other: The mastoid air cells are well aerated. IMPRESSION: No acute intracranial process. Electronically Signed   By: Merilyn Baba M.D.   On: 10/21/2022 23:55   Medications:  sodium chloride 10 mL/hr at 10/23/22 0804   ceFEPime (MAXIPIME) IV 1 g (10/22/22 1231)    acetaminophen  1,000 mg Oral Once   amLODipine  10 mg Oral Daily    Chlorhexidine Gluconate Cloth  6 each Topical Daily   furosemide  80 mg Oral BID   gentamicin cream  1 Application Topical Daily   heparin injection (subcutaneous)  5,000 Units Subcutaneous Q8H   hydroxychloroquine  200 mg Oral QODAY   irbesartan  300 mg Oral QPM   melatonin  3 mg Oral QHS   pantoprazole  40 mg Oral BID AC   propranolol  40 mg Oral QID   sodium chloride flush  3 mL Intravenous Q12H   spironolactone  25 mg Oral Daily   vancomycin variable dose per unstable renal function (pharmacist dosing)   Does not apply See admin instructions    Dialysis Orders: Former PD -> transitioned to HD  Assessment/Plan: Polymicrobial PD Peritonitis; OP cultures showing  Klebsiella, Citrobacter, enterobacter R to cefazolin but otherwise pan-sensitive.  PD cath not functioning anyway so will convert to hemo.  D/w pt- disappointed but in agreement.  Case postponed 12/19 d/t AMS- CT head negative, AAO x 3 today and PD catheter was removed this AM. Repeat Bcx ordered. On cefepime and vancomycin PD cath malfunction: removed, converted to HD as above ESRD: Formerly PD, converted to HD but having issues with temporary catheter ,trying HD again today.  HTN: BP controlled, euvolemic on exam on CCB, MRB, ARB as outpt Hx/o SLE: per primary team Anemia: Hgb drifted down to 6.9 yesterday, given 1 unit PRBC and now 9.2. Will start ESA.   Anice Paganini, PA-C 10/23/2022, 12:20 PM  [facility] Pager: [phone]

## 2022-10-23 NOTE — Progress Notes (Addendum)
Progress Note  Day of Surgery  Subjective: Doing MUCH better today. Alert and oriented, understands what's going on. No complaints  Objective: Vital signs in last 24 hours: Temp:  [98.3 F (36.8 C)-100.5 F (38.1 C)] 98.6 F (37 C) (12/21 0730) Pulse Rate:  [30-119] 108 (12/21 0730) Resp:  [18-31] 20 (12/21 0730) BP: (121-149)/(74-86) 138/86 (12/21 0730) SpO2:  [94 %-100 %] 94 % (12/21 0730) Weight:  [51.3 kg] 51.3 kg (12/20 2048) Last BM Date : 10/21/22  Intake/Output from previous day: 12/20 0701 - 12/21 0700 In: 300.1 [IV Piggyback:300.1] Out: -400  Intake/Output this shift: No intake/output data recorded.  PE: General: chronically ill appearing female lying in bed in NAD HEENT: head is normocephalic, atraumatic.  Sclera are noninjected.  Pupils equal and round.  Ears and nose without any masses or lesions.  Mouth is pink and moist Heart: tachycardic, HR low 100s Lungs: Respiratory effort nonlabored on room air Abd: well healed midline incision. soft, NT, ND, +BS, no masses, hernias, or organomegaly. PD catheter present in the LLQ Skin: warm and dry with no masses, lesions, or rashes Psych: Alert, oriented to person and place but not to time or situation   Lab Results:  Recent Labs    10/21/22 0326 10/21/22 1301 10/23/22 0306  WBC 15.1*  --  11.1*  HGB 7.7* 8.2* 6.9*  HCT 24.3* 24.0* 22.6*  PLT 409*  --  278   BMET Recent Labs    10/21/22 0326 10/21/22 1301 10/21/22 1548  NA 133* 134* 135  K 5.5* 3.3* 3.5  CL 96* 97* 95*  CO2 12*  --  20*  GLUCOSE 55* 48* 102*  BUN 125* 38* 49*  CREATININE 17.20* 7.50* 8.64*  CALCIUM 7.5*  --  7.6*   PT/INR No results for input(s): "LABPROT", "INR" in the last 72 hours. CMP     Component Value Date/Time   NA 135 10/21/2022 1548   NA 137 05/21/2020 0905   K 3.5 10/21/2022 1548   CL 95 (L) 10/21/2022 1548   CO2 20 (L) 10/21/2022 1548   GLUCOSE 102 (H) 10/21/2022 1548   BUN 49 (H) 10/21/2022 1548   BUN 57  (H) 05/21/2020 0905   CREATININE 8.64 (H) 10/21/2022 1548   CALCIUM 7.6 (L) 10/21/2022 1548   PROT 6.8 10/16/2022 0031   PROT 6.2 05/21/2020 0905   ALBUMIN 2.0 (L) 10/16/2022 0031   ALBUMIN 3.2 (L) 05/21/2020 0905   AST 15 10/16/2022 0031   ALT 10 10/16/2022 0031   ALKPHOS 48 10/16/2022 0031   BILITOT 2.3 (H) 10/16/2022 0031   BILITOT 0.4 05/21/2020 0905   GFRNONAA 5 (L) 10/21/2022 1548   GFRAA 5 (L) 05/21/2020 0905   Lipase     Component Value Date/Time   LIPASE 40 10/16/2022 0031       Studies/Results: CT HEAD WO CONTRAST (5MM)  Result Date: 10/21/2022 CLINICAL DATA:  Mental status change, unknown cause EXAM: CT HEAD WITHOUT CONTRAST TECHNIQUE: Contiguous axial images were obtained from the base of the skull through the vertex without intravenous contrast. RADIATION DOSE REDUCTION: This exam was performed according to the departmental dose-optimization program which includes automated exposure control, adjustment of the mA and/or kV according to patient size and/or use of iterative reconstruction technique. COMPARISON:  None Available. FINDINGS: Brain: No evidence of acute infarction, hemorrhage, mass, mass effect, or midline shift. No hydrocephalus or extra-axial fluid collection. 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 Periventricular Jeannie Mallinger matter changes, likely the sequela of chronic small vessel ischemic disease. Vascular: No hyperdense vessel. Skull: Normal. Negative for fracture or focal lesion. Sinuses/Orbits: No acute finding. Other: The mastoid air cells are well aerated. IMPRESSION: No acute intracranial process. Electronically Signed   By: Merilyn Baba M.D.   On: 10/21/2022 23:55    Anti-infectives: Anti-infectives (From admission, onward)    Start     Dose/Rate Route Frequency Ordered Stop   10/23/22 0315  vancomycin (VANCOCIN) IVPB 1000 mg/200 mL premix         1,000 mg 200 mL/hr over 60 Minutes Intravenous  Once 10/23/22 0223 10/23/22 0452   10/23/22 0223  [MAR Hold]  vancomycin variable dose per unstable renal function (pharmacist dosing)        (MAR Hold since Thu 10/23/2022 at 0718.Hold Reason: Transfer to a Procedural area)    Does not apply See admin instructions 10/23/22 0223     10/22/22 1200  [MAR Hold]  ceFEPIme (MAXIPIME) 1 g in sodium chloride 0.9 % 100 mL IVPB        (MAR Hold since Thu 10/23/2022 at 0718.Hold Reason: Transfer to a Procedural area)   1 g 200 mL/hr over 30 Minutes Intravenous Every 24 hours 10/22/22 0751     10/21/22 1230  ceFAZolin (ANCEF) IVPB 1 g/50 mL premix  Status:  Discontinued        1 g 100 mL/hr over 30 Minutes Intravenous Every 24 hours 10/21/22 1218 10/22/22 0751   10/17/22 1000  ceFEPIme (MAXIPIME) 1 g in sodium chloride 0.9 % 100 mL IVPB  Status:  Discontinued        1 g 200 mL/hr over 30 Minutes Intravenous Every 24 hours 10/16/22 0954 10/21/22 1218   10/17/22 1000  [MAR Hold]  hydroxychloroquine (PLAQUENIL) tablet 200 mg        (MAR Hold since Thu 10/23/2022 at 0718.Hold Reason: Transfer to a Procedural area)   200 mg Oral Every other day 10/16/22 1256     10/16/22 0954  vancomycin variable dose per unstable renal function (pharmacist dosing)  Status:  Discontinued         Does not apply See admin instructions 10/16/22 0954 10/17/22 1448   10/16/22 0430  vancomycin (VANCOCIN) IVPB 1000 mg/200 mL premix        1,000 mg 200 mL/hr over 60 Minutes Intravenous  Once 10/16/22 0426 10/16/22 0631   10/16/22 0415  ceFEPIme (MAXIPIME) 2 g in sodium chloride 0.9 % 100 mL IVPB        2 g 200 mL/hr over 30 Minutes Intravenous  Once 10/16/22 0414 10/16/22 0631        Assessment/Plan  PD peritonitis  - CT without abscess, some enteritis - WBC 11 now, afebrile - body fluid culture with ampicillin resistant klebsiella pneumoniae - Patient with infected PD catheter - attempted to take to 12/19 but patient  tachypneic and hypoglycemic in pre-op; uremic. Case cancelled.   Now cleared for surgery and cognitively & clinically much better   -We have discussed removal of peritoneal dialysis catheter for reasons noted  -The planned procedure, material risks (including, but not limited to, pain, bleeding, infection, scarring, hernia, worsening of pre-existing medical conditions, pneumonia, heart attack, stroke, death) benefits and alternatives to surgery were discussed at length. She understands that she will need ongoing hemodialysis following which could be indefinite. The patient's questions were answered to her satisfaction, she voiced understanding and elected to  proceed with surgery. Additionally, we discussed typical postoperative expectations and the recovery process.  Previously have also obtained over the phone consent for this from her daughter 10/21/22 as well   FEN: NPO VTE: SQH ID: [email or web address] 12/15>>    LOS: 7 days   I spent a total of 40 minutes in both face-to-face and non-face-to-face activities, excluding procedures performed, for this visit on the date of this encounter.    Nadeen Landau, MD [facility], [facility]

## 2022-10-24 ENCOUNTER — Encounter (HOSPITAL_COMMUNITY): Payer: Self-pay | Admitting: Surgery

## 2022-10-24 DIAGNOSIS — N186 End stage renal disease: Secondary | ICD-10-CM | POA: Diagnosis not present

## 2022-10-24 DIAGNOSIS — K650 Generalized (acute) peritonitis: Secondary | ICD-10-CM | POA: Diagnosis not present

## 2022-10-24 DIAGNOSIS — I10 Essential (primary) hypertension: Secondary | ICD-10-CM | POA: Diagnosis not present

## 2022-10-24 DIAGNOSIS — M329 Systemic lupus erythematosus, unspecified: Secondary | ICD-10-CM | POA: Diagnosis not present

## 2022-10-24 LAB — TYPE AND SCREEN
ABO/RH(D): A POS
Antibody Screen: NEGATIVE
Unit division: 0

## 2022-10-24 LAB — CBC
HCT: 25.9 % — ABNORMAL LOW (ref 36.0–46.0)
Hemoglobin: 8.1 g/dL — ABNORMAL LOW (ref 12.0–15.0)
MCH: 26.2 pg (ref 26.0–34.0)
MCHC: 31.3 g/dL (ref 30.0–36.0)
MCV: 83.8 fL (ref 80.0–100.0)
Platelets: 244 10*3/uL (ref 150–400)
RBC: 3.09 MIL/uL — ABNORMAL LOW (ref 3.87–5.11)
RDW: 20.3 % — ABNORMAL HIGH (ref 11.5–15.5)
WBC: 12.1 10*3/uL — ABNORMAL HIGH (ref 4.0–10.5)
nRBC: 0.2 % (ref 0.0–0.2)

## 2022-10-24 LAB — BPAM RBC
Blood Product Expiration Date: 202312282359
ISSUE DATE / TIME: 202312210536
Unit Type and Rh: 6200

## 2022-10-24 MED ORDER — SODIUM CHLORIDE 0.9 % IV SOLN
2.0000 g | INTRAVENOUS | Status: DC
  Administered 2022-10-24: 2 g via INTRAVENOUS
  Filled 2022-10-24: qty 20

## 2022-10-24 NOTE — Assessment & Plan Note (Addendum)
Peritoneal HD catheter related acute peritonitis.  Complicated with sepsis (present on admission).   Peritoneal fluid positive for Klebsiella pneumonia resistant to ampicillin, sensitive to cephalosporins.  Outpatient culture positive for Klebsiella, Citrobacter, enterobacter resistant to cefazolin.  PD catheter has been removed.  Wbc is 8.5 and patient has been afebrile.  Positive bloating and diarrhea.   Plan to discontinue Vancomycin today and will continue with Cefepime until 12/28 to complete 14 days.  Follow up cell count in am.  Add bentyl as needed.

## 2022-10-24 NOTE — Assessment & Plan Note (Addendum)
Continue blood pressure control with amlodipine, irbersatan, propranolol, furosemide and spironolactone.

## 2022-10-24 NOTE — Progress Notes (Signed)
[facility] Progress Note   Subjective:   Seen in room.  Feeling better today.  Was able to run with tempcath last evening.  Will need CLIP in [place].  Cultures negative so far- drawn 12.20.    Objective Vitals:   10/23/22 1907 10/23/22 1912 10/23/22 2241 10/24/22 0831  BP: (!) 147/84  127/76 122/80  Pulse: 97  97   Resp: 18   20  Temp: 99.5 F (37.5 C)  99.1 F (37.3 C)   TempSrc: Axillary  Oral   SpO2: 95%  100% 99%  Weight:  58 kg    Height:       Physical Exam General: Alert female in NAD Heart: RRR, no murmurs, rubs or gallops Lungs: CTA bilaterally Abdomen: PD catheter removed, +BS Extremities: No edema b/l lower extremities Dialysis Access: nontunneled R IJ catheter in place  Additional Objective Labs: Basic Metabolic Panel: Recent Labs  Lab 10/21/22 0326 10/21/22 1301 10/21/22 1548 10/23/22 0820 10/23/22 1600  NA 133*   < > 135 135 137  K 5.5*   < > 3.5 4.0 3.8  CL 96*   < > 95* 98 99  CO2 12*  --  20*  --  23  GLUCOSE 55*   < > 102* 94 85  BUN 125*   < > 49* 45* 51*  CREATININE 17.20*   < > 8.64* 8.30* 8.33*  CALCIUM 7.5*  --  7.6*  --  6.8*   < > = values in this interval not displayed.   Liver Function Tests: No results for input(s): "AST", "ALT", "ALKPHOS", "BILITOT", "PROT", "ALBUMIN" in the last 168 hours. No results for input(s): "LIPASE", "AMYLASE" in the last 168 hours. CBC: Recent Labs  Lab 10/19/22 0316 10/20/22 0407 10/21/22 0326 10/21/22 1301 10/23/22 0306 10/23/22 0820 10/23/22 1600 10/24/22 0432  WBC 8.1 15.9* 15.1*  --  11.1*  --   --  12.1*  NEUTROABS  --   --  14.2*  --   --   --   --   --   HGB 8.4* 8.1* 7.7*   < > 6.9* 9.2* 8.4* 8.1*  HCT 25.8* 25.0* 24.3*   < > 22.6* 27.0* 26.5* 25.9*  MCV 83.2 83.1 84.4  --  86.3  --   --  83.8  PLT 407* 422* 409*  --  278  --   --  244   < > = values in this interval not displayed.   Blood Culture    Component Value Date/Time   SDES CATH TIP 10/23/2022 1015    SPECREQUEST NONE 10/23/2022 1015   CULT  10/23/2022 1015    NO GROWTH < 24 HOURS Performed at [facility], [place] [address] [place], [place] [address]    REPTSTATUS PENDING 10/23/2022 1015    Cardiac Enzymes: No results for input(s): "CKTOTAL", "CKMB", "CKMBINDEX", "TROPONINI" in the last 168 hours. CBG: Recent Labs  Lab 10/21/22 1328 10/21/22 1453 10/21/22 2011 10/22/22 1240 10/23/22 1217  GLUCAP 162* 111* 76 95 82   Iron Studies: No results for input(s): "IRON", "TIBC", "TRANSFERRIN", "FERRITIN" in the last 72 hours. '@lablastinr3'$ @ Studies/Results: No results found. Medications:  sodium chloride 10 mL/hr at 10/24/22 7616   ceFEPime (MAXIPIME) IV 1 g (10/23/22 1354)    acetaminophen  1,000 mg Oral Once   amLODipine  10 mg Oral Daily   Chlorhexidine Gluconate Cloth  6 each Topical Daily   darbepoetin (ARANESP) injection - DIALYSIS  40 mcg Subcutaneous Q  Thu-1800   furosemide  80 mg Oral BID   gentamicin cream  1 Application Topical Daily   heparin injection (subcutaneous)  5,000 Units Subcutaneous Q8H   hydroxychloroquine  200 mg Oral QODAY   irbesartan  300 mg Oral QPM   melatonin  3 mg Oral QHS   pantoprazole  40 mg Oral BID AC   propranolol  40 mg Oral QID   sodium chloride flush  3 mL Intravenous Q12H   spironolactone  25 mg Oral Daily   vancomycin variable dose per unstable renal function (pharmacist dosing)   Does not apply See admin instructions    Dialysis Orders: Former PD -> transitioned to HD  Assessment/Plan: Polymicrobial PD Peritonitis; OP cultures showing  Klebsiella, Citrobacter, enterobacter R to cefazolin but otherwise pan-sensitive.  PD cath not functioning anyway so will convert to hemo.  D/w pt- disappointed but in agreement.  Case postponed 12/19 d/t AMS- CT head negative, AAO x 3 today and PD catheter was removed this AM. Repeat Bcx ordered 12/20. On cefepime and vancomycin (added 12/21).  She will need TDC- she'll be here over the weekend  d/t cultures and CLIP so not emergent.  I will order PD cath malfunction: removed, converted to HD as above ESRD: Formerly PD, converted to HD but having issues with temporary catheter.  Ran OK 12/21.  Next HD 12/23 HTN: BP controlled, euvolemic on exam on CCB, MRB, ARB as outpt Hx/o SLE: per primary team Anemia: Hgb drifted down to 6.9 yesterday, given 1 unit PRBC and now 9.2. Will start ESA.   Madelon Lips MD 10/24/2022, 11:13 AM  [facility] Pager: [phone]

## 2022-10-24 NOTE — Assessment & Plan Note (Signed)
Continue with pantoprazole/.  

## 2022-10-24 NOTE — Progress Notes (Signed)
Progress Note   Patient: Sheena Smith HYQ:[id number] DOB: 1951/01/21 DOA: 10/15/2022     8 DOS: the patient was seen and examined on 10/24/2022   Brief hospital course: Sheena Smith was admitted to the hospital with the working diagnosis of acute peritonitis, related to PD catheter.    71 y.o. female with a history of hypertension, ESRD on PD and lupus who reported right lower quadrant abdominal pain for 5 days, associated with fevers, chills, nausea, diarrhea and poor appetite. Apparently HD catheter not working appropriately. 2 days prior to admission she was diagnosed with PD catheter infection, received intraperitoneal vancomycin and planned to received ceftazidime. Peritoneal culture positive for gram negative rods. Unfortunately at home her symptoms continue to worsen with more severe abdominal pain, and dyspnea, prompting her to come to the hospital. On her initial physical examination her temp was 101.F, HR 90, blood pressure 115/65, RR 21 and 02 saturation 96% on room air. Lungs with no wheezing or rales, heart with S1 and S2 present and regular, abdomen tender to palpation, positive guarding, PD catheter in place, no lower extremity edema.   Na 133, K 3.6 CL 96 bicarbonate 19 glucose 122 bun 75 cr 13.0  Wbc 2.7 hgb 10,0 plt 381  Sars covid 19 negative Influenza negative   CT abdomen with no acute changes. Peritoneal catheter in the right lower quadrant.  Abdominal radiograph with peritoneal catheter in the right pelvis. Air fluid levels throughout the bowel loops in the central abdomen suggesting ileus.   EKG 97 bpm, right axis deviation, normal intervals, sinus rhythm with poor R R wave progression, no ST segment changes, negative T wave lead III, AvF, V5 and V6.   Patient was placed on IV antibiotic therapy.  12/18 tunneled HD catheter placed and underwent hemodialysis.  12/20 PD cathter removed.  12/22 patient on IV antibiotic therapy, plan to placed tunneled catheter on  12.26. per IR.   Assessment and Plan: * Acute peritonitis ([place]) Peritoneal HD catheter related acute peritonitis.  Complicated with sepsis (present on admission).  Peritoneal fluid positive for Klebsiella pneumonia resistant to ampicillin, sensitive to cephalosporins.   Continue antibiotic therapy with IV vancomycin and will change cefepime to ceftriaxone. Follow up cell count, and repeat cultures.    ESRD on peritoneal dialysis [facility]) Patient with uremia on admission, acute metabolic encephalopathy.  Her mentation has improved after hemodialysis.  Patient will need outpatient HD unit and a tunneled catheter.   Anemia of chronic renal disease, continue with EPO.   Essential hypertension Continue blood pressure control with amlodipine, irbersatan, propranolol, furosemide and spironolactone.   Lupus ([place]) Continue with hydrochloroquine  No signs of acute flare.   GERD (gastroesophageal reflux disease) Continue with pantoprazole.         Subjective: patient with no chest pain or dyspnea, abdominal pain is improving, with no nausea or vomiting   Physical Exam: Vitals:   10/23/22 1912 10/23/22 2241 10/24/22 0831 10/24/22 1425  BP:  127/76 122/80 137/78  Pulse:  97  (!) 103  Resp:   20 20  Temp:  99.1 F (37.3 C)  99.9 F (37.7 C)  TempSrc:  Oral  Oral  SpO2:  100% 99% 97%  Weight: 58 kg     Height:       Neurology awake and alert ENT with mild pallor Cardiovascular with S1 and S2 present and rhythmic  Respiratory with no rales or wheezing Abdomen with no distention No lower extremity edema  Data Reviewed:  Family Communication: no family at the bedside   Disposition: Status is: Inpatient Remains inpatient appropriate because: IV antibiotic therapy, pending tunneled catheter placement   Planned Discharge Destination: Home     Author: Tawni Millers, MD 10/24/2022 2:48 PM  For on call review [email or web address].

## 2022-10-24 NOTE — Progress Notes (Signed)
Advised pt to require HD at d/c. Met with pt at bedside. Introduced self and explained role. Pt lives in [place] and was receiving PD care through [facility] prior to admission. Pt voices interest in HD clinic in the [place] area that is closer to pt's home. Pt also agreeable to navigator contacting a daughter to discuss clinic needs at d/c. Pt prefers daughter, Imagene Sheller, be contacted. Spoke to pt's daughter, Imagene Sheller, via phone. Pt's daughter lives in [place] and states pt will likely come stay with her at d/c and she prefers pt stay at [facility]. Daughter plans to visit pt this afternoon and discuss with the pt the option of staying with daughter at d/c and staying at [name]. Daughter to return call to navigator after speaking with pt this afternoon to confirm preference for out-pt HD clinic at d/c. Will make referral to appropriate agency for out-pt HD once daughter returns call with final decision regarding clinic choice. Update provided to nephrologist.   Melven Sartorius Renal Navigator [phone]

## 2022-10-24 NOTE — Assessment & Plan Note (Addendum)
Patient with uremia on admission, acute metabolic encephalopathy.  Her mentation has improved after hemodialysis.  Plan for tunneled catheter placement on 10/28/22.   Anemia of chronic renal disease, continue with EPO.   Patient with very poor oral intake, will change to heart healthy diet and will consult nutrition for recommendations.

## 2022-10-24 NOTE — Assessment & Plan Note (Signed)
Continue with hydrochloroquine  No signs of acute flare.

## 2022-10-25 DIAGNOSIS — I10 Essential (primary) hypertension: Secondary | ICD-10-CM | POA: Diagnosis not present

## 2022-10-25 DIAGNOSIS — N186 End stage renal disease: Secondary | ICD-10-CM | POA: Diagnosis not present

## 2022-10-25 DIAGNOSIS — M329 Systemic lupus erythematosus, unspecified: Secondary | ICD-10-CM | POA: Diagnosis not present

## 2022-10-25 DIAGNOSIS — K650 Generalized (acute) peritonitis: Secondary | ICD-10-CM | POA: Diagnosis not present

## 2022-10-25 MED ORDER — SODIUM CHLORIDE 0.9 % IV SOLN
1.0000 g | INTRAVENOUS | Status: AC
  Administered 2022-10-25 – 2022-10-29 (×5): 1 g via INTRAVENOUS
  Filled 2022-10-25 (×6): qty 10

## 2022-10-25 MED ORDER — HEPARIN SODIUM (PORCINE) 1000 UNIT/ML IJ SOLN
INTRAMUSCULAR | Status: AC
  Administered 2022-10-25: 2600 [IU]
  Filled 2022-10-25: qty 3

## 2022-10-25 MED ORDER — HEPARIN SODIUM (PORCINE) 1000 UNIT/ML IJ SOLN
INTRAMUSCULAR | Status: AC
  Administered 2022-10-25: 2000 [IU]
  Filled 2022-10-25: qty 2

## 2022-10-25 NOTE — Progress Notes (Signed)
Received patient in bed to unit.  Alert and oriented.  Informed consent signed and in chart.   Treatment initiated: 0927 Treatment completed: 1245  Patient tolerated well.  Transported back to the room  Alert, without acute distress.  Hand-off given to patient's nurse.   Access used: Catheter Access issues: positional, pressures were abnormal and sometimes the lines were pulling air.  Total UF removed: 1300 Medication(s) given: Heparin bolus Post HD VS: 98.3, 137/101(108), HR-102, RR-24,SP02-100 Post HD weight: 50.9kg   [facility]

## 2022-10-25 NOTE — Plan of Care (Signed)

## 2022-10-25 NOTE — Plan of Care (Signed)
  Problem: Clinical Measurements: Goal: Ability to maintain clinical measurements within normal limits will improve Outcome: Progressing   

## 2022-10-25 NOTE — Progress Notes (Signed)
[facility] Progress Note   Subjective:   seen on dialysis.  Belly feeling better.   Objective Vitals:   10/25/22 1130 10/25/22 1230 10/25/22 1245 10/25/22 1319  BP: (!) 146/97 (!) 142/80    Pulse: (!) 102 100    Resp: (!) 26 (!) 29    Temp:   98.3 F (36.8 C)   TempSrc:   Oral   SpO2: 98% 92%    Weight:    50.9 kg  Height:       Physical Exam General: Alert female in NAD Heart: RRR, no murmurs, rubs or gallops Lungs: CTA bilaterally Abdomen: PD catheter removed, +BS Extremities: No edema b/l lower extremities Dialysis Access: nontunneled R IJ catheter in place  Additional Objective Labs: Basic Metabolic Panel: Recent Labs  Lab 10/21/22 0326 10/21/22 1301 10/21/22 1548 10/23/22 0820 10/23/22 1600  NA 133*   < > 135 135 137  K 5.5*   < > 3.5 4.0 3.8  CL 96*   < > 95* 98 99  CO2 12*  --  20*  --  23  GLUCOSE 55*   < > 102* 94 85  BUN 125*   < > 49* 45* 51*  CREATININE 17.20*   < > 8.64* 8.30* 8.33*  CALCIUM 7.5*  --  7.6*  --  6.8*   < > = values in this interval not displayed.   Liver Function Tests: No results for input(s): "AST", "ALT", "ALKPHOS", "BILITOT", "PROT", "ALBUMIN" in the last 168 hours. No results for input(s): "LIPASE", "AMYLASE" in the last 168 hours. CBC: Recent Labs  Lab 10/19/22 0316 10/20/22 0407 10/21/22 0326 10/21/22 1301 10/23/22 0306 10/23/22 0820 10/23/22 1600 10/24/22 0432  WBC 8.1 15.9* 15.1*  --  11.1*  --   --  12.1*  NEUTROABS  --   --  14.2*  --   --   --   --   --   HGB 8.4* 8.1* 7.7*   < > 6.9* 9.2* 8.4* 8.1*  HCT 25.8* 25.0* 24.3*   < > 22.6* 27.0* 26.5* 25.9*  MCV 83.2 83.1 84.4  --  86.3  --   --  83.8  PLT 407* 422* 409*  --  278  --   --  244   < > = values in this interval not displayed.   Blood Culture    Component Value Date/Time   SDES CATH TIP 10/23/2022 1015   SPECREQUEST NONE 10/23/2022 1015   CULT  10/23/2022 1015    NO GROWTH < 24 HOURS Performed at [facility], [place] [address], [place], [place] [address]    REPTSTATUS PENDING 10/23/2022 1015    Cardiac Enzymes: No results for input(s): "CKTOTAL", "CKMB", "CKMBINDEX", "TROPONINI" in the last 168 hours. CBG: Recent Labs  Lab 10/21/22 1328 10/21/22 1453 10/21/22 2011 10/22/22 1240 10/23/22 1217  GLUCAP 162* 111* 76 95 82   Iron Studies: No results for input(s): "IRON", "TIBC", "TRANSFERRIN", "FERRITIN" in the last 72 hours. '@lablastinr3'$ @ Studies/Results: No results found. Medications:  sodium chloride 10 mL/hr at 10/24/22 9381   cefTRIAXone (ROCEPHIN)  IV 2 g (10/24/22 1716)    acetaminophen  1,000 mg Oral Once   amLODipine  10 mg Oral Daily   Chlorhexidine Gluconate Cloth  6 each Topical Daily   darbepoetin (ARANESP) injection - DIALYSIS  40 mcg Subcutaneous Q Thu-1800   furosemide  80 mg Oral BID   gentamicin cream  1 Application Topical Daily   heparin  injection (subcutaneous)  5,000 Units Subcutaneous Q8H   heparin sodium (porcine)       heparin sodium (porcine)       hydroxychloroquine  200 mg Oral QODAY   irbesartan  300 mg Oral QPM   melatonin  3 mg Oral QHS   pantoprazole  40 mg Oral BID AC   propranolol  40 mg Oral QID   sodium chloride flush  3 mL Intravenous Q12H   spironolactone  25 mg Oral Daily   vancomycin variable dose per unstable renal function (pharmacist dosing)   Does not apply See admin instructions    Dialysis Orders: Former PD -> transitioned to HD  Assessment/Plan: Polymicrobial PD Peritonitis; OP cultures showing  Klebsiella, Citrobacter, enterobacter R to cefazolin but otherwise pan-sensitive.  PD cath not functioning anyway so will convert to hemo.  D/w pt- disappointed but in agreement.  Case postponed 12/19 d/t AMS- CT head negative, AAO x 3 today and PD catheter was removed 12/21 Repeat Bcx ordered 12/20. On cefepime and vancomycin (added 12/21).  She will need TDC- she'll be here over the weekend d/t cultures and CLIP so not emergent.  I will order.  HD today  12/23 PD cath malfunction: removed, converted to HD as above ESRD: Formerly PD, converted to HD but having issues with temporary catheter.  Ran OK 12/21.  Next HD 12/23 HTN: BP controlled, euvolemic on exam on CCB, MRB, ARB as outpt Hx/o SLE: per primary team Anemia: Hgb drifted down to 6.9 yesterday, given 1 unit PRBC and now 9.2. Will start ESA.   Madelon Lips MD 10/25/2022, 1:24 PM  [facility] Pager: [phone]

## 2022-10-25 NOTE — Progress Notes (Signed)
Progress Note   Patient: Sheena Smith OVF:[id number] DOB: 09-Jan-1951 DOA: 10/15/2022     9 DOS: the patient was seen and examined on 10/25/2022   Brief hospital course: Sheena Smith was admitted to the hospital with the working diagnosis of acute peritonitis, related to PD catheter.    71 y.o. female with a history of hypertension, ESRD on PD and lupus who reported right lower quadrant abdominal pain for 5 days, associated with fevers, chills, nausea, diarrhea and poor appetite. Apparently HD catheter not working appropriately. 2 days prior to admission she was diagnosed with PD catheter infection, received intraperitoneal vancomycin and planned to received ceftazidime. Peritoneal culture positive for gram negative rods. Unfortunately at home her symptoms continue to worsen with more severe abdominal pain, and dyspnea, prompting her to come to the hospital. On her initial physical examination her temp was 101.F, HR 90, blood pressure 115/65, RR 21 and 02 saturation 96% on room air. Lungs with no wheezing or rales, heart with S1 and S2 present and regular, abdomen tender to palpation, positive guarding, PD catheter in place, no lower extremity edema.   Na 133, K 3.6 CL 96 bicarbonate 19 glucose 122 bun 75 cr 13.0  Wbc 2.7 hgb 10,0 plt 381  Sars covid 19 negative Influenza negative   CT abdomen with no acute changes. Peritoneal catheter in the right lower quadrant.  Abdominal radiograph with peritoneal catheter in the right pelvis. Air fluid levels throughout the bowel loops in the central abdomen suggesting ileus.   EKG 97 bpm, right axis deviation, normal intervals, sinus rhythm with poor R R wave progression, no ST segment changes, negative T wave lead III, AvF, V5 and V6.   Patient was placed on IV antibiotic therapy.  12/18 tunneled HD catheter placed and underwent hemodialysis.  12/20 PD cathter removed.  12/22 patient on IV antibiotic therapy, plan to placed tunneled catheter on  12.26. per IR.  12/23 tolerating HD well.   Assessment and Plan: * Acute peritonitis (Sheena Smith) Peritoneal HD catheter related acute peritonitis.  Complicated with sepsis (present on admission).   Peritoneal fluid positive for Klebsiella pneumonia resistant to ampicillin, sensitive to cephalosporins.  Outpatient culture positive for Klebsiella, Citrobacter, enterobacter resistant to cefazolin.  PD catheter has been removed.  Wbc 12.1 she has been afebrile.   Continue antibiotic therapy with IV vancomycin and cefepime.   ESRD on peritoneal dialysis [facility]) Patient with uremia on admission, acute metabolic encephalopathy.  Her mentation has improved after hemodialysis.  Plan for tunneled catheter placement on 10/28/22.   Anemia of chronic renal disease, continue with EPO.   Patient with very poor oral intake, will change to heart healthy diet and will consult nutrition for recommendations.   Essential hypertension Continue blood pressure control with amlodipine, irbersatan, propranolol, furosemide and spironolactone.   Lupus (Sheena Smith) Continue with hydrochloroquine  No signs of acute flare.   GERD (gastroesophageal reflux disease) Continue with pantoprazole.         Subjective: patient with no chest pain or dyspnea, abdominal pain continue to improve. She has been having poor oral intake   Physical Exam: Vitals:   10/25/22 1230 10/25/22 1245 10/25/22 1319 10/25/22 1358  BP: (!) 142/80   (!) 150/74  Pulse: 100   (!) 109  Resp: (!) 29   20  Temp:  98.3 F (36.8 C)  98.6 F (37 C)  TempSrc:  Oral  Oral  SpO2: 92%   100%  Weight:   50.9 kg  Height:       Neurology awake and alert ENT with mild pallor Cardiovascular with S1 and S2 present and rhythmic with no gallops or rubs Respiratory with no rales or wheezing Abdomen with no distention, non tender to superficial palpation   No lower extremity edema  Data Reviewed:    Family Communication: I spoke with patient's  daughter over the phone, we talked in detail about patient's condition, plan of care and prognosis and all questions were addressed.   Disposition: Status is: Inpatient Remains inpatient appropriate because: pending tunneled catheter placement   Planned Discharge Destination: Home      Author: Tawni Millers, MD 10/25/2022 2:29 PM  For on call review [email or web address].

## 2022-10-25 NOTE — Procedures (Signed)
Patient seen and examined on Hemodialysis. BP (!) 142/80 (BP Location: Right Arm)   Pulse 100   Temp 98.3 F (36.8 C) (Oral)   Resp (!) 29   Ht '5\' 2"'$  (1.575 m)   Wt 50.9 kg   SpO2 92%   BMI 20.52 kg/m   QB 300 mL/ min via nontunneled HD cath, UF goal 2L  Tolerating treatment without complaints at this time.  Has positional catheter.    Madelon Lips MD [facility] Pgr [phone] 1:26 PM

## 2022-10-25 NOTE — Progress Notes (Signed)
Pharmacy Antibiotic Note  Sheena Smith is a 71 y.o. female admitted on 10/15/2022 with peritonitis and treated w/ cefepime, now spiking fevers with tachycardia >> concern for sepsis.  Pharmacy has been consulted to broaden ABX with vancomycin and cefepime.  Of note pt is ESRD on PD, PD cath not functioning so has been removed.   Had been on cefepime until changed to ceftriaxone yesterday. WBC 12, Scr 8.33 - now receiving HD (received session today- was stopped an hour early). Afebrile.   Plan: Plan for VR on 12/24  Will start cefepime 1g IV every 24 hours  Monitor cx results, clinical pic, and vanc levels as appropriate    Height: '5\' 2"'$  (157.5 cm) Weight: 50.9 kg (112 lb 3.4 oz) IBW/kg (Calculated) : 50.1  Temp (24hrs), Avg:98.5 F (36.9 C), Min:98.3 F (36.8 C), Max:98.8 F (37.1 C)  Recent Labs  Lab 10/19/22 0316 10/20/22 0407 10/21/22 0326 10/21/22 1301 10/21/22 1548 10/23/22 0306 10/23/22 0820 10/23/22 1600 10/24/22 0432  WBC 8.1 15.9* 15.1*  --   --  11.1*  --   --  12.1*  CREATININE 15.19*  --  17.20* 7.50* 8.64*  --  8.30* 8.33*  --   LATICACIDVEN  --   --   --   --   --  0.8  --   --   --      Estimated Creatinine Clearance: 5 mL/min (A) (by C-G formula based on SCr of 8.33 mg/dL (H)).    Allergies  Allergen Reactions   Nsaids     "Kidney Disease. Exception to this NSAID intolerance is aspirin 81-'325mg'$  daily"'   Other     Per pt, states that she was informed she was allergic to sulfa after allergy testing  "Kidney Disease. Exception to this NSAID intolerance is aspirin 81-'325mg'$  daily"'   Atenolol     palpitation   Elemental Sulfur    Antimicrobials this admission:  Vancomycin 12/14 x1,  12/21 >> Cefepime 12/24 >> 12/22, 12/23 >> Ceftriaxone 12/22 x1  Dose adjustments this admission:  N/A  Microbiology results:  12/14 BCx: ngtd3 12/15: Peritoneal fluid: Kpneumo (R- amp) [facility] Peritoneal cx: Kpneumo (same as MC Cx), citrobacter werkmanii  (R-cefazolin but S to cefepime, FQ's, imipenem), enterobacter cloacae (R-cefazolin, S-cefepime, FQ, imipenem) 12/21 PD cath tip: ngtd  Thank you for allowing pharmacy to be a part of this patient's care.  Antonietta Jewel, PharmD, [place] Clinical Pharmacist  Phone: [phone] 10/25/2022 3:39 PM  Please check AMION for all [place] phone numbers After 10:00 PM, call [place] [phone]

## 2022-10-26 DIAGNOSIS — N186 End stage renal disease: Secondary | ICD-10-CM | POA: Diagnosis not present

## 2022-10-26 DIAGNOSIS — M329 Systemic lupus erythematosus, unspecified: Secondary | ICD-10-CM | POA: Diagnosis not present

## 2022-10-26 DIAGNOSIS — K650 Generalized (acute) peritonitis: Secondary | ICD-10-CM | POA: Diagnosis not present

## 2022-10-26 DIAGNOSIS — I10 Essential (primary) hypertension: Secondary | ICD-10-CM | POA: Diagnosis not present

## 2022-10-26 LAB — VANCOMYCIN, RANDOM: Vancomycin Rm: 30 ug/mL

## 2022-10-26 MED ORDER — PANTOPRAZOLE SODIUM 40 MG PO TBEC
40.0000 mg | DELAYED_RELEASE_TABLET | Freq: Every day | ORAL | Status: DC
  Administered 2022-10-27 – 2022-11-06 (×10): 40 mg via ORAL
  Filled 2022-10-26 (×11): qty 1

## 2022-10-26 MED ORDER — DARBEPOETIN ALFA 40 MCG/0.4ML IJ SOSY
40.0000 ug | PREFILLED_SYRINGE | INTRAMUSCULAR | Status: DC
  Administered 2022-10-26: 40 ug via SUBCUTANEOUS
  Filled 2022-10-26: qty 0.4

## 2022-10-26 NOTE — Progress Notes (Signed)
[facility] Progress Note   Subjective:   Seen in room.  Doing much better than the past couple of days.  Walking around the room and feeling stronger.    Objective Vitals:   10/26/22 0500 10/26/22 0517 10/26/22 0929 10/26/22 1313  BP:  127/69 123/66 136/72  Pulse:  92 (!) 103 88  Resp:  18 16   Temp:  98.4 F (36.9 C) 98.9 F (37.2 C)   TempSrc:  Oral Oral   SpO2:  94% 95%   Weight: 51.8 kg     Height:       Physical Exam General: Alert female in NAD Heart: RRR, no murmurs, rubs or gallops Lungs: CTA bilaterally Abdomen: PD catheter removed, +BS Extremities: No edema b/l lower extremities Dialysis Access: nontunneled R IJ catheter in place  Additional Objective Labs: Basic Metabolic Panel: Recent Labs  Lab 10/21/22 0326 10/21/22 1301 10/21/22 1548 10/23/22 0820 10/23/22 1600  NA 133*   < > 135 135 137  K 5.5*   < > 3.5 4.0 3.8  CL 96*   < > 95* 98 99  CO2 12*  --  20*  --  23  GLUCOSE 55*   < > 102* 94 85  BUN 125*   < > 49* 45* 51*  CREATININE 17.20*   < > 8.64* 8.30* 8.33*  CALCIUM 7.5*  --  7.6*  --  6.8*   < > = values in this interval not displayed.   Liver Function Tests: No results for input(s): "AST", "ALT", "ALKPHOS", "BILITOT", "PROT", "ALBUMIN" in the last 168 hours. No results for input(s): "LIPASE", "AMYLASE" in the last 168 hours. CBC: Recent Labs  Lab 10/20/22 0407 10/21/22 0326 10/21/22 1301 10/23/22 0306 10/23/22 0820 10/23/22 1600 10/24/22 0432  WBC 15.9* 15.1*  --  11.1*  --   --  12.1*  NEUTROABS  --  14.2*  --   --   --   --   --   HGB 8.1* 7.7*   < > 6.9* 9.2* 8.4* 8.1*  HCT 25.0* 24.3*   < > 22.6* 27.0* 26.5* 25.9*  MCV 83.1 84.4  --  86.3  --   --  83.8  PLT 422* 409*  --  278  --   --  244   < > = values in this interval not displayed.   Blood Culture    Component Value Date/Time   SDES CATH TIP 10/23/2022 1015   SPECREQUEST NONE 10/23/2022 1015   CULT  10/23/2022 1015    NO GROWTH 3 DAYS NO ANAEROBES  ISOLATED; CULTURE IN PROGRESS FOR 5 DAYS Performed at [facility], [place] [address] [place], [place] [address]    REPTSTATUS PENDING 10/23/2022 1015    Cardiac Enzymes: No results for input(s): "CKTOTAL", "CKMB", "CKMBINDEX", "TROPONINI" in the last 168 hours. CBG: Recent Labs  Lab 10/21/22 1328 10/21/22 1453 10/21/22 2011 10/22/22 1240 10/23/22 1217  GLUCAP 162* 111* 76 95 82   Iron Studies: No results for input(s): "IRON", "TIBC", "TRANSFERRIN", "FERRITIN" in the last 72 hours. '@lablastinr3'$ @ Studies/Results: No results found. Medications:  sodium chloride 10 mL/hr at 10/24/22 2458   ceFEPime (MAXIPIME) IV 1 g (10/25/22 1615)    acetaminophen  1,000 mg Oral Once   amLODipine  10 mg Oral Daily   Chlorhexidine Gluconate Cloth  6 each Topical Daily   darbepoetin (ARANESP) injection - DIALYSIS  40 mcg Subcutaneous Q Sun-1800   furosemide  80 mg Oral BID  gentamicin cream  1 Application Topical Daily   heparin injection (subcutaneous)  5,000 Units Subcutaneous Q8H   hydroxychloroquine  200 mg Oral QODAY   irbesartan  300 mg Oral QPM   melatonin  3 mg Oral QHS   [START ON 10/27/2022] pantoprazole  40 mg Oral Daily   propranolol  40 mg Oral QID   sodium chloride flush  3 mL Intravenous Q12H   spironolactone  25 mg Oral Daily   vancomycin variable dose per unstable renal function (pharmacist dosing)   Does not apply See admin instructions    Dialysis Orders: Former PD -> transitioned to HD  Assessment/Plan: Polymicrobial PD Peritonitis; OP cultures showing  Klebsiella, Citrobacter, enterobacter R to cefazolin but otherwise pan-sensitive.  PD cath not functioning anyway so will convert to hemo.  D/w pt- disappointed but in agreement.  Case postponed 12/19 d/t AMS- CT head negative, AAO x 3 today and PD catheter was removed 12/21 Repeat Bcx ordered 12/20. On cefepime and vancomycin (added 12/21).  She will need TDC- she'll be here over the weekend d/t cultures and CLIP so  not emergent. [place] likely Tuesday.  HD today 12/23 PD cath malfunction: removed, converted to HD as above ESRD: Formerly PD, converted to HD but having issues with temporary catheter.  Ran OK 12/21.  Next HD 12/23 HTN: BP controlled, euvolemic on exam on CCB, MRB, ARB as outpt Hx/o SLE: per primary team Anemia: Hgb drifted down to 6.9 yesterday, given 1 unit PRBC and now 9.2. Will start ESA.   Madelon Lips MD 10/26/2022, 2:05 PM  [facility] Pager: [phone]

## 2022-10-26 NOTE — Consult Note (Signed)
HPI:  The patient has had a H&P performed within the last 30 days, all history, medications, and exam have been reviewed. The patient denies any interval changes since the H&P.  Request received for conversion of temporary hemodialysis catheter to tunneled hemodialysis catheter.  Medications: Prior to Admission medications   Medication Sig Start Date End Date Taking? Authorizing Provider  amLODipine (NORVASC) 10 MG tablet Take 1 tablet by mouth daily. 11/07/19 10/16/22 Yes [provider]  ferrous sulfate 324 MG TBEC Take 324 mg by mouth daily with breakfast.   Yes [provider]  furosemide (LASIX) 80 MG tablet Take 80 mg by mouth 2 (two) times daily.   Yes [provider]  hydroxychloroquine (PLAQUENIL) 200 MG tablet Take 200 mg by mouth every other day.   Yes [provider]  irbesartan (AVAPRO) 300 MG tablet Take 300 mg by mouth every evening.   Yes [provider]  pantoprazole (PROTONIX) 40 MG tablet Take 40 mg by mouth daily.   Yes [provider]  propranolol (INDERAL) 20 MG tablet Take 40 mg by mouth 4 (four) times daily. 02/14/19 10/16/22 Yes [provider]  sodium bicarbonate 650 MG tablet Take 650 mg by mouth once as needed for heartburn. 09/26/19 09/26/23 Yes [provider]  spironolactone (ALDACTONE) 25 MG tablet Take 25 mg by mouth daily.   Yes [provider]     Vital Signs: BP 123/66 (BP Location: Right Arm)   Pulse (!) 103   Temp 98.9 F (37.2 C) (Oral)   Resp 16   Ht '5\' 2"'$  (1.575 m)   Wt 114 lb 3.2 oz (51.8 kg)   SpO2 95%   BMI 20.89 kg/m   Physical Exam Vitals reviewed.  Constitutional:      General: She is not in acute distress. HENT:     Mouth/Throat:     Mouth: Mucous membranes are moist.  Cardiovascular:     Rate and Rhythm: Regular rhythm. Tachycardia present.     Comments: HR of 103 during exam Abdominal:     General: Bowel sounds are normal.     Palpations:  Abdomen is soft.  Musculoskeletal:     Right lower leg: No edema.     Left lower leg: No edema.  Skin:    General: Skin is warm and dry.  Neurological:     Mental Status: She is alert and oriented to person, place, and time.  Psychiatric:        Mood and Affect: Mood normal.        Behavior: Behavior normal.     Mallampati Score:  MD Evaluation Airway: WNL Heart: WNL Abdomen: WNL Chest/ Lungs: WNL ASA  Classification: 3 Mallampati/Airway Score: Two  Labs:  CBC: Recent Labs    10/20/22 0407 10/21/22 0326 10/21/22 1301 10/23/22 0306 10/23/22 0820 10/23/22 1600 10/24/22 0432  WBC 15.9* 15.1*  --  11.1*  --   --  12.1*  HGB 8.1* 7.7*   < > 6.9* 9.2* 8.4* 8.1*  HCT 25.0* 24.3*   < > 22.6* 27.0* 26.5* 25.9*  PLT 422* 409*  --  278  --   --  244   < > = values in this interval not displayed.    COAGS: Recent Labs    10/16/22 0530  INR 1.3*  APTT 39*    BMP: Recent Labs    10/19/22 0316 10/21/22 0326 10/21/22 1301 10/21/22 1548 10/23/22 0820 10/23/22 1600  NA 131* 133* 134* 135 135  137  K 4.2 5.5* 3.3* 3.5 4.0 3.8  CL 95* 96* 97* 95* 98 99  CO2 17* 12*  --  20*  --  23  GLUCOSE 113* 55* 48* 102* 94 85  BUN 100* 125* 38* 49* 45* 51*  CALCIUM 6.9* 7.5*  --  7.6*  --  6.8*  CREATININE 15.19* 17.20* 7.50* 8.64* 8.30* 8.33*  GFRNONAA 2* 2*  --  5*  --  5*    LIVER FUNCTION TESTS: Recent Labs    10/16/22 0031  BILITOT 2.3*  AST 15  ALT 10  ALKPHOS 48  PROT 6.8  ALBUMIN 2.0*    Assessment/Plan:   ESRD Requiring Hemodialysis -Patient with existing right IJ temporary dialysis catheter placed 10/20/22. Request received for conversion of temporary catheter to tunneled dialysis catheter due to long term need for hemodialysis. Case reviewed and approved for procedure tentatively on 10/28/22.   Risks and benefits discussed with the patient including, but not limited to bleeding, infection, vascular injury, pneumothorax which may require chest tube  placement, air embolism or even death  All of the patient's questions were answered, patient is agreeable to proceed. Consent signed and in chart.   Signed: Lura Em, PA-C 10/26/2022, 10:15 AM

## 2022-10-26 NOTE — Plan of Care (Signed)

## 2022-10-26 NOTE — Progress Notes (Signed)
Progress Note   Patient: Sheena Smith WUJ:[id number] DOB: [birth year] DOA: 10/15/2022     10 DOS: the patient was seen and examined on 10/26/2022   Brief hospital course: Sheena Smith was admitted to the hospital with the working diagnosis of acute peritonitis, related to PD catheter.    71 y.o. female with a history of hypertension, ESRD on PD and lupus who reported right lower quadrant abdominal pain for 5 days, associated with fevers, chills, nausea, diarrhea and poor appetite. Apparently HD catheter not working appropriately. 2 days prior to admission she was diagnosed with PD catheter infection, received intraperitoneal vancomycin and planned to received ceftazidime. Peritoneal culture positive for gram negative rods. Unfortunately at home her symptoms continue to worsen with more severe abdominal pain, and dyspnea, prompting her to come to the hospital. On her initial physical examination her temp was 101.F, HR 90, blood pressure 115/65, RR 21 and 02 saturation 96% on room air. Lungs with no wheezing or rales, heart with S1 and S2 present and regular, abdomen tender to palpation, positive guarding, PD catheter in place, no lower extremity edema.   Na 133, K 3.6 CL 96 bicarbonate 19 glucose 122 bun 75 cr 13.0  Wbc 2.7 hgb 10,0 plt 381  Sars covid 19 negative Influenza negative   CT abdomen with no acute changes. Peritoneal catheter in the right lower quadrant.  Abdominal radiograph with peritoneal catheter in the right pelvis. Air fluid levels throughout the bowel loops in the central abdomen suggesting ileus.   EKG 97 bpm, right axis deviation, normal intervals, sinus rhythm with poor R R wave progression, no ST segment changes, negative T wave lead III, AvF, V5 and V6.   Patient was placed on IV antibiotic therapy.  12/18 tunneled HD catheter placed and underwent hemodialysis.  12/20 PD cathter removed.  12/22 patient on IV antibiotic therapy, plan to placed tunneled catheter on  12.26. per IR.  12/23 tolerating HD well.  12/24 continue to improve abdominal pain. Discontinue telemetry.   Assessment and Plan: * Acute peritonitis (Sheena Smith) Peritoneal HD catheter related acute peritonitis.  Complicated with sepsis (present on admission).   Peritoneal fluid positive for Klebsiella pneumonia resistant to ampicillin, sensitive to cephalosporins.  Outpatient culture positive for Klebsiella, Citrobacter, enterobacter resistant to cefazolin.  PD catheter has been removed.  Abdominal pain is improving.   Continue antibiotic therapy with IV vancomycin and cefepime.  Follow up cell count in am.  Ok to discontinue telemetry monitoring.   ESRD on peritoneal dialysis Sheena Smith) Patient with uremia on admission, acute metabolic encephalopathy.  Her mentation has improved after hemodialysis.  Encephalopathy has resolved.  Plan for tunneled catheter placement on 10/28/22.   Anemia of chronic renal disease, continue with EPO.   Essential hypertension Continue blood pressure control with amlodipine, irbersatan, propranolol, furosemide and spironolactone.   Lupus (Sheena Smith) Continue with hydrochloroquine  No signs of acute flare.   GERD (gastroesophageal reflux disease) Continue with pantoprazole.         Subjective: Patient with no chest pain or dyspnea. Abdominal pain has improved.   Physical Exam: Vitals:   10/25/22 2102 10/26/22 0500 10/26/22 0517 10/26/22 0929  BP: 120/69  127/69 123/66  Pulse: 99  92 (!) 103  Resp: '17  18 16  '$ Temp: 99.3 F (37.4 C)  98.4 F (36.9 C) 98.9 F (37.2 C)  TempSrc: Oral  Oral Oral  SpO2: 91%  94% 95%  Weight:  51.8 kg    Height:  Neurology awake and alert ENT with mild pallor Cardiovascular with S1 and S2 present and rhythmic Respiratory with no rales or wheezing Abdomen with no distention  No lower extremity edema  Data Reviewed:    Family Communication: no family at the bedside   Disposition: Status is:  Inpatient Remains inpatient appropriate because: pending tunneled HD catheter placement  Planned Discharge Destination: Home     Author: Tawni Millers, MD 10/26/2022 12:03 PM  For on call review [email or web address].

## 2022-10-26 NOTE — Progress Notes (Signed)
Pharmacy Antibiotic Note  Sheena Smith is a 71 y.o. female admitted on 10/15/2022 with peritonitis and treated w/ cefepime, now spiking fevers with tachycardia >> concern for sepsis.  Pharmacy has been consulted to broaden ABX with vancomycin and cefepime.  Of note pt is ESRD on PD, PD cath not functioning so has been removed.   Had been on cefepime until changed to ceftriaxone yesterday. WBC 12, Scr 8.33 - now receiving HD (received session today- was stopped an hour early). Afebrile.   Plan: Vancomycin random level 30 - No Vancomycin today (HD 12/23) Will start cefepime 1g IV every 24 hours  Monitor cx results, clinical pic, and vanc levels as appropriate    Height: '5\' 2"'$  (157.5 cm) Weight: 51.8 kg (114 lb 3.2 oz) IBW/kg (Calculated) : 50.1  Temp (24hrs), Avg:98.8 F (37.1 C), Min:98.3 F (36.8 C), Max:99.3 F (37.4 C)  Recent Labs  Lab 10/20/22 0407 10/21/22 0326 10/21/22 1301 10/21/22 1548 10/23/22 0306 10/23/22 0820 10/23/22 1600 10/24/22 0432 10/26/22 0423  WBC 15.9* 15.1*  --   --  11.1*  --   --  12.1*  --   CREATININE  --  17.20* 7.50* 8.64*  --  8.30* 8.33*  --   --   LATICACIDVEN  --   --   --   --  0.8  --   --   --   --   VANCORANDOM  --   --   --   --   --   --   --   --  30     Estimated Creatinine Clearance: 5 mL/min (A) (by C-G formula based on SCr of 8.33 mg/dL (H)).    Allergies  Allergen Reactions   Nsaids     "Kidney Disease. Exception to this NSAID intolerance is aspirin 81-'325mg'$  daily"'   Other     Per pt, states that she was informed she was allergic to sulfa after allergy testing  "Kidney Disease. Exception to this NSAID intolerance is aspirin 81-'325mg'$  daily"'   Atenolol     palpitation   Elemental Sulfur    Antimicrobials this admission:  Vancomycin 12/14 x1,  12/21 >> Cefepime 12/24 >> 12/22, 12/23 >> Ceftriaxone 12/22 x1  Dose adjustments this admission:  N/A  Microbiology results:  12/14 BCx: ngtd3 12/15: Peritoneal fluid:  Kpneumo (R- amp) [facility] Peritoneal cx: Kpneumo (same as MC Cx), citrobacter werkmanii (R-cefazolin but S to cefepime, FQ's, imipenem), enterobacter cloacae (R-cefazolin, S-cefepime, FQ, imipenem) 12/21 PD cath tip: ngtd  Thank you for allowing pharmacy to be a part of this patient's care. Anette Guarneri, PharmD 10/26/2022 8:04 AM  Please check AMION for all [facility] phone numbers After 10:00 PM, call [place] [phone]

## 2022-10-27 DIAGNOSIS — I10 Essential (primary) hypertension: Secondary | ICD-10-CM | POA: Diagnosis not present

## 2022-10-27 DIAGNOSIS — N186 End stage renal disease: Secondary | ICD-10-CM | POA: Diagnosis not present

## 2022-10-27 DIAGNOSIS — M329 Systemic lupus erythematosus, unspecified: Secondary | ICD-10-CM | POA: Diagnosis not present

## 2022-10-27 DIAGNOSIS — K650 Generalized (acute) peritonitis: Secondary | ICD-10-CM | POA: Diagnosis not present

## 2022-10-27 LAB — CBC
HCT: 28.8 % — ABNORMAL LOW (ref 36.0–46.0)
Hemoglobin: 8.5 g/dL — ABNORMAL LOW (ref 12.0–15.0)
MCH: 25.8 pg — ABNORMAL LOW (ref 26.0–34.0)
MCHC: 29.5 g/dL — ABNORMAL LOW (ref 30.0–36.0)
MCV: 87.3 fL (ref 80.0–100.0)
Platelets: 272 10*3/uL (ref 150–400)
RBC: 3.3 MIL/uL — ABNORMAL LOW (ref 3.87–5.11)
RDW: 18.6 % — ABNORMAL HIGH (ref 11.5–15.5)
WBC: 8.5 10*3/uL (ref 4.0–10.5)
nRBC: 0 % (ref 0.0–0.2)

## 2022-10-27 LAB — RENAL FUNCTION PANEL
Albumin: <1.5 g/dL — ABNORMAL LOW (ref 3.5–5.0)
Anion gap: 13 (ref 5–15)
BUN: 34 mg/dL — ABNORMAL HIGH (ref 8–23)
CO2: 23 mmol/L (ref 22–32)
Calcium: 7.4 mg/dL — ABNORMAL LOW (ref 8.9–10.3)
Chloride: 97 mmol/L — ABNORMAL LOW (ref 98–111)
Creatinine, Ser: 6.98 mg/dL — ABNORMAL HIGH (ref 0.44–1.00)
GFR, Estimated: 6 mL/min — ABNORMAL LOW (ref >60)
Glucose, Bld: 83 mg/dL (ref 70–99)
Phosphorus: 5.4 mg/dL — ABNORMAL HIGH (ref 2.5–4.6)
Potassium: 3.6 mmol/L (ref 3.5–5.1)
Sodium: 133 mmol/L — ABNORMAL LOW (ref 135–145)

## 2022-10-27 MED ORDER — DICYCLOMINE HCL 10 MG PO CAPS
10.0000 mg | ORAL_CAPSULE | Freq: Three times a day (TID) | ORAL | Status: DC | PRN
  Administered 2022-10-27 – 2022-10-29 (×2): 10 mg via ORAL
  Filled 2022-10-27 (×3): qty 1

## 2022-10-27 MED ORDER — CALCIUM ACETATE (PHOS BINDER) 667 MG PO CAPS
667.0000 mg | ORAL_CAPSULE | Freq: Three times a day (TID) | ORAL | Status: DC
  Administered 2022-10-27 – 2022-11-06 (×24): 667 mg via ORAL
  Filled 2022-10-27 (×26): qty 1

## 2022-10-27 MED ORDER — RENA-VITE PO TABS
1.0000 | ORAL_TABLET | Freq: Every day | ORAL | Status: DC
  Administered 2022-10-27 – 2022-11-05 (×10): 1 via ORAL
  Filled 2022-10-27 (×10): qty 1

## 2022-10-27 MED ORDER — NEPRO/CARBSTEADY PO LIQD
237.0000 mL | Freq: Two times a day (BID) | ORAL | Status: DC
  Administered 2022-10-29 – 2022-10-31 (×4): 237 mL via ORAL

## 2022-10-27 NOTE — Progress Notes (Addendum)
Progress Note   Patient: Sheena Smith MOQ:[id number] DOB: 1951-01-23 DOA: 10/15/2022     11 DOS: the patient was seen and examined on 10/27/2022   Brief hospital course: Keighley Deckman was admitted to the hospital with the working diagnosis of acute peritonitis, related to PD catheter.    71 y.o. female with a history of hypertension, ESRD on PD and lupus who reported right lower quadrant abdominal pain for 5 days, associated with fevers, chills, nausea, diarrhea and poor appetite. Apparently HD catheter not working appropriately. 2 days prior to admission she was diagnosed with PD catheter infection, received intraperitoneal vancomycin and planned to received ceftazidime. Peritoneal culture positive for gram negative rods. Unfortunately at home her symptoms continue to worsen with more severe abdominal pain, and dyspnea, prompting her to come to the hospital. On her initial physical examination her temp was 101.F, HR 90, blood pressure 115/65, RR 21 and 02 saturation 96% on room air. Lungs with no wheezing or rales, heart with S1 and S2 present and regular, abdomen tender to palpation, positive guarding, PD catheter in place, no lower extremity edema.   Na 133, K 3.6 CL 96 bicarbonate 19 glucose 122 bun 75 cr 13.0  Wbc 2.7 hgb 10,0 plt 381  Sars covid 19 negative Influenza negative   CT abdomen with no acute changes. Peritoneal catheter in the right lower quadrant.  Abdominal radiograph with peritoneal catheter in the right pelvis. Air fluid levels throughout the bowel loops in the central abdomen suggesting ileus.   EKG 97 bpm, right axis deviation, normal intervals, sinus rhythm with poor R R wave progression, no ST segment changes, negative T wave lead III, AvF, V5 and V6.   Patient was placed on IV antibiotic therapy.  12/18 tunneled HD catheter placed and underwent hemodialysis.  12/20 PD cathter removed.  12/22 patient on IV antibiotic therapy, plan to placed tunneled catheter on  12.26. per IR.  12/23 tolerating HD well.  12/24 continue to improve abdominal pain. Discontinue telemetry.  12/25 pending replacement of HD catheter to tunneled.   Assessment and Plan: * Acute peritonitis ([place]) Peritoneal HD catheter related acute peritonitis.  Complicated with sepsis (present on admission).   Peritoneal fluid positive for Klebsiella pneumonia resistant to ampicillin, sensitive to cephalosporins.  Outpatient culture positive for Klebsiella, Citrobacter, enterobacter resistant to cefazolin.  PD catheter has been removed.  Wbc is 8.5 and patient has been afebrile.  Positive bloating and diarrhea.   Plan to discontinue Vancomycin today and will continue with Cefepime until 12/28 to complete 14 days.  Follow up cell count in am.  Add bentyl as needed.   ESRD on peritoneal dialysis ([place]) Hyponatremia, hyperphosphatemia   Patient with uremia on admission, acute metabolic encephalopathy.  Her mentation has improved after hemodialysis.  Encephalopathy has resolved.  Plan for tunneled catheter placement on 10/28/22.   Na 133, K 3,6 bicarbonate 23, P 5.4  Hold on furosemide (patient now on HD).   Metabolic bone disease, add phoslo.   Anemia of chronic renal disease, continue with EPO.   Essential hypertension Continue blood pressure control with amlodipine, irbersatan, propranolol, and spironolactone.  Systolic blood pressure 354 to 130 mmHg.   Lupus ([place]) Continue with hydrochloroquine  No signs of acute flare.   GERD (gastroesophageal reflux disease) Continue with pantoprazole.         Subjective: Patient with abdominal bloating and diarrhea, no chest pain or dyspnea. Yesterday she has been out of bed. Positive flatus   Physical  Exam: Vitals:   10/26/22 2126 10/27/22 0334 10/27/22 0500 10/27/22 0857  BP: 131/72 132/71  125/69  Pulse: 93 93  97  Resp: '18 18  19  '$ Temp: 99.5 F (37.5 C) 99.7 F (37.6 C)  98.5 F (36.9 C)  TempSrc: Oral Oral  Oral   SpO2: 93% 95%  99%  Weight:   51.5 kg   Height:       Neurology awake and alert ENT with mild pallor Cardiovascular with S1 and S2 present and rhythmic Respiratory with no rales or wheezing Abdomen with mild distention, tympanic to percussion, with no rebound or guarding  No lower extremity edema  Data Reviewed:    Family Communication: I spoke with patient's son and daughter in law at the bedside, we talked in detail about patient's condition, plan of care and prognosis and all questions were addressed.   Disposition: Status is: Inpatient Remains inpatient appropriate because: pending tunneled HD catheter placement   Planned Discharge Destination: Home    Author: Tawni Millers, MD 10/27/2022 1:23 PM  For on call review [email or web address].

## 2022-10-27 NOTE — Progress Notes (Signed)
Brief note:  Going for temp cath to [facility] conversion hopefully tomorrow with IR- appreciate assistance Will write HD orders to be done afterwards.  Madelon Lips MD [employer]

## 2022-10-27 NOTE — Progress Notes (Signed)
Initial Nutrition Assessment  DOCUMENTATION CODES:   Not applicable  INTERVENTION:  - Add Nepro Shake po BID, each supplement provides 425 kcal and 19 grams protein   - Add MVI q day.   NUTRITION DIAGNOSIS:   Inadequate oral intake related to poor appetite as evidenced by meal completion < 50%.  GOAL:   Patient will meet greater than or equal to 90% of their needs  MONITOR:   PO intake, Supplement acceptance  REASON FOR ASSESSMENT:   Consult Assessment of nutrition requirement/status  ASSESSMENT:   71 y.o. female admits related to abdominal pain. PMH includes: HTN, ESRD on PD, and lupus. Pt is currently receiving medical management for acute peritonitis.  Meds reviewed: aldactone. Labs reviewed: Na low, phos high.   Attempted to call pt room, but no answer. Pt has eaten 25-50% of her meals since admit. No significant wt loss per record. RD will add Nepro BID for now and a renal MVI. Will continue to monitor PO intakes.   NUTRITION - FOCUSED PHYSICAL EXAM:  Unable to assess, attempt at f/u.   Diet Order:   Diet Order             Diet NPO time specified  Diet effective now           Diet Heart Room service appropriate? Yes; Fluid consistency: Thin  Diet effective now                   EDUCATION NEEDS:   Not appropriate for education at this time  Skin:  Skin Assessment: Skin Integrity Issues: Skin Integrity Issues:: Incisions Incisions: abdomen  Last BM:  12/25  Height:   Ht Readings from Last 1 Encounters:  10/16/22 '5\' 2"'$  (1.575 m)    Weight:   Wt Readings from Last 1 Encounters:  10/27/22 51.5 kg    Ideal Body Weight:     BMI:  Body mass index is 20.77 kg/m.  Estimated Nutritional Needs:   Kcal:  4235-3614 kcals  Protein:  65-75 gm  Fluid:  >/= 1.2 L  Thalia Bloodgood, RD, LDN, CNSC.

## 2022-10-28 ENCOUNTER — Inpatient Hospital Stay (HOSPITAL_COMMUNITY): Payer: Medicare Other

## 2022-10-28 DIAGNOSIS — I10 Essential (primary) hypertension: Secondary | ICD-10-CM | POA: Diagnosis not present

## 2022-10-28 DIAGNOSIS — K650 Generalized (acute) peritonitis: Secondary | ICD-10-CM | POA: Diagnosis not present

## 2022-10-28 DIAGNOSIS — N186 End stage renal disease: Secondary | ICD-10-CM | POA: Diagnosis not present

## 2022-10-28 DIAGNOSIS — M329 Systemic lupus erythematosus, unspecified: Secondary | ICD-10-CM | POA: Diagnosis not present

## 2022-10-28 HISTORY — PX: IR FLUORO GUIDE CV LINE RIGHT: IMG2283

## 2022-10-28 LAB — BASIC METABOLIC PANEL
Anion gap: 11 (ref 5–15)
BUN: 45 mg/dL — ABNORMAL HIGH (ref 8–23)
CO2: 22 mmol/L (ref 22–32)
Calcium: 7.5 mg/dL — ABNORMAL LOW (ref 8.9–10.3)
Chloride: 101 mmol/L (ref 98–111)
Creatinine, Ser: 8.76 mg/dL — ABNORMAL HIGH (ref 0.44–1.00)
GFR, Estimated: 5 mL/min — ABNORMAL LOW (ref >60)
Glucose, Bld: 92 mg/dL (ref 70–99)
Potassium: 3.7 mmol/L (ref 3.5–5.1)
Sodium: 134 mmol/L — ABNORMAL LOW (ref 135–145)

## 2022-10-28 LAB — CBC WITH DIFFERENTIAL/PLATELET
Abs Immature Granulocytes: 0.1 10*3/uL — ABNORMAL HIGH (ref 0.00–0.07)
Basophils Absolute: 0 10*3/uL (ref 0.0–0.1)
Basophils Relative: 1 %
Eosinophils Absolute: 0.2 10*3/uL (ref 0.0–0.5)
Eosinophils Relative: 3 %
HCT: 24.6 % — ABNORMAL LOW (ref 36.0–46.0)
Hemoglobin: 7.6 g/dL — ABNORMAL LOW (ref 12.0–15.0)
Immature Granulocytes: 1 %
Lymphocytes Relative: 9 %
Lymphs Abs: 0.8 10*3/uL (ref 0.7–4.0)
MCH: 26.1 pg (ref 26.0–34.0)
MCHC: 30.9 g/dL (ref 30.0–36.0)
MCV: 84.5 fL (ref 80.0–100.0)
Monocytes Absolute: 0.8 10*3/uL (ref 0.1–1.0)
Monocytes Relative: 9 %
Neutro Abs: 6.8 10*3/uL (ref 1.7–7.7)
Neutrophils Relative %: 77 %
Platelets: 268 10*3/uL (ref 150–400)
RBC: 2.91 MIL/uL — ABNORMAL LOW (ref 3.87–5.11)
RDW: 18.1 % — ABNORMAL HIGH (ref 11.5–15.5)
WBC: 8.8 10*3/uL (ref 4.0–10.5)
nRBC: 0 % (ref 0.0–0.2)

## 2022-10-28 LAB — CULTURE, BLOOD (ROUTINE X 2)
Culture: NO GROWTH
Culture: NO GROWTH
Special Requests: ADEQUATE
Special Requests: ADEQUATE

## 2022-10-28 LAB — AEROBIC/ANAEROBIC CULTURE W GRAM STAIN (SURGICAL/DEEP WOUND)
Culture: NO GROWTH
Gram Stain: NONE SEEN

## 2022-10-28 MED ORDER — FENTANYL CITRATE (PF) 100 MCG/2ML IJ SOLN
INTRAMUSCULAR | Status: AC | PRN
  Administered 2022-10-28: 25 ug via INTRAVENOUS

## 2022-10-28 MED ORDER — HEPARIN SODIUM (PORCINE) 1000 UNIT/ML IJ SOLN
INTRAMUSCULAR | Status: AC
  Filled 2022-10-28: qty 10

## 2022-10-28 MED ORDER — LIDOCAINE-EPINEPHRINE 1 %-1:100000 IJ SOLN
INTRAMUSCULAR | Status: AC
  Administered 2022-10-28: 10 mL
  Filled 2022-10-28: qty 1

## 2022-10-28 MED ORDER — MIDAZOLAM HCL 2 MG/2ML IJ SOLN
INTRAMUSCULAR | Status: AC | PRN
  Administered 2022-10-28: 1 mg via INTRAVENOUS

## 2022-10-28 MED ORDER — DARBEPOETIN ALFA 100 MCG/0.5ML IJ SOSY
100.0000 ug | PREFILLED_SYRINGE | INTRAMUSCULAR | Status: DC
  Filled 2022-10-28: qty 0.5

## 2022-10-28 MED ORDER — MIDAZOLAM HCL 2 MG/2ML IJ SOLN
INTRAMUSCULAR | Status: AC
  Filled 2022-10-28: qty 2

## 2022-10-28 MED ORDER — FENTANYL CITRATE (PF) 100 MCG/2ML IJ SOLN
INTRAMUSCULAR | Status: AC
  Filled 2022-10-28: qty 2

## 2022-10-28 NOTE — TOC Progression Note (Addendum)
Transition of Care [facility]) - Progression Note    Patient Details  Name: Sheena Smith MRN: [id number] Date of Birth: August 14, 1951  [facility] CM/SW Contact  Tom-Johnson, Renea Ee, RN Phone Number: 10/28/2022, 3:34 PM  Clinical Narrative:     Patient had permanent HD cath placement today. Continues IV abx. Awaiting family's decision on the outpatient clinic patient should go to. Denies any needs at this time. CM will continue to follow as patient progresses with care towards discharge.        Expected Discharge Plan and Services                                               Social Determinants of Health (SDOH) Interventions SDOH Screenings   Food Insecurity: No Food Insecurity (10/17/2022)  Housing: Low Risk  (10/17/2022)  Transportation Needs: No Transportation Needs (10/17/2022)  Utilities: Not At Risk (10/17/2022)  Tobacco Use: Medium Risk (10/24/2022)    Readmission Risk Interventions     No data to display

## 2022-10-28 NOTE — Progress Notes (Addendum)
Spoke to pt's daughter, Imagene Sheller, this morning via phone. Pt and pt's family have not made a final decision regarding HD clinic at d/c. Pt's family feels it may be best for pt to remain at [facility] at d/c and then change clinics later if needed. Pt's family to talk to pt today for final decision and return call to navigator with update so clinic placement can be completed.   Melven Sartorius Renal Navigator [phone]  Addendum at 12:39 pm: Spoke to pt's daughter, Imagene Sheller,  and she confirms that pt and family would like to receive in-center HD at [facility] if possible. Contacted clinic and spoke to PD RN, Crystal, regarding pt needing HD at d/c and pt prefers to remain at her clinic if possible. Crystal is checking to see if clinic has availability for in-center HD at d/c for pt. Awaiting a return call from clinic.   Addendum at 1:49 pm: Received a return call from [place] [facility] can offer a TTS 6:45 am chair time or TTS 10:15 am chair time. Spoke to pt's daughter, Imagene Sheller, and she prefers the TTS 10:15. Returned call to [employer] to make clinic aware of family's preference. Crystal to pass along info to in-center staff who will finalize details once d/c date is known. Clinical info faxed to clinic today for review. Will fax [facility] note once available. Crystal also advised pt will need iv cefepime with HD at d/c. Crystal to check to make sure that in-center has access to iv abx at d/c. Update provided to nephrologist. Will assist as needed.

## 2022-10-28 NOTE — Procedures (Signed)
  Procedure:  Placement tunneled R IJ HD CVC Palindrome 19 Preprocedure diagnosis: The primary encounter diagnosis was Peritonitis ([place]). A diagnosis of Acute peritonitis ([place]) was also pertinent to this visit.  Postprocedure diagnosis: same EBL:    minimal Complications:   none immediate  See full dictation in [employer].  Dillard Cannon MD Main # [phone] Pager  [phone] Mobile [phone]

## 2022-10-28 NOTE — Care Management Important Message (Signed)
Important Message  Patient Details  Name: Sheena Smith MRN: [id number] Date of Birth: 1951/04/14   Medicare Important Message Given:  Yes     Orbie Pyo 10/28/2022, 3:28 PM

## 2022-10-28 NOTE — Progress Notes (Signed)
[facility] Progress Note   Subjective:   Seen in room.  NPO awaiting TDC placement today and so hungry causing nausea.  O/w ok.   Objective Vitals:   10/27/22 0857 10/27/22 1748 10/27/22 2046 10/28/22 0528  BP: 125/69 122/70 128/72 132/75  Pulse: 97 94 93 94  Resp: '19 18 18 18  '$ Temp: 98.5 F (36.9 C) 98.9 F (37.2 C) 98.7 F (37.1 C) 98.1 F (36.7 C)  TempSrc: Oral Oral Oral Oral  SpO2: 99% 97% 96% 99%  Weight:    51.1 kg  Height:       Physical Exam General: Alert female in NAD Heart: RRR, no murmurs, rubs or gallops Lungs: CTA bilaterally Abdomen: PD catheter removed, +BS  Extremities: No edema b/l lower extremities Dialysis Access: nontunneled R IJ catheter in place   Additional Objective Labs: Basic Metabolic Panel: Recent Labs  Lab 10/23/22 1600 10/27/22 0418 10/28/22 0754  NA 137 133* 134*  K 3.8 3.6 3.7  CL 99 97* 101  CO2 '23 23 22  '$ GLUCOSE 85 83 92  BUN 51* 34* 45*  CREATININE 8.33* 6.98* 8.76*  CALCIUM 6.8* 7.4* 7.5*  PHOS  --  5.4*  --     Liver Function Tests: Recent Labs  Lab 10/27/22 0418  ALBUMIN <1.5*   No results for input(s): "LIPASE", "AMYLASE" in the last 168 hours. CBC: Recent Labs  Lab 10/23/22 0306 10/23/22 0820 10/24/22 0432 10/27/22 0418 10/28/22 0754  WBC 11.1*  --  12.1* 8.5 8.8  NEUTROABS  --   --   --   --  6.8  HGB 6.9*   < > 8.1* 8.5* 7.6*  HCT 22.6*   < > 25.9* 28.8* 24.6*  MCV 86.3  --  83.8 87.3 84.5  PLT 278  --  244 272 268   < > = values in this interval not displayed.    Blood Culture    Component Value Date/Time   SDES CATH TIP 10/23/2022 1015   SPECREQUEST NONE 10/23/2022 1015   CULT  10/23/2022 1015    NO GROWTH 4 DAYS NO ANAEROBES ISOLATED; CULTURE IN PROGRESS FOR 5 DAYS Performed at [facility], [place] [address], [place], [place] [address]    REPTSTATUS PENDING 10/23/2022 1015    Cardiac Enzymes: No results for input(s): "CKTOTAL", "CKMB", "CKMBINDEX", "TROPONINI" in  the last 168 hours. CBG: Recent Labs  Lab 10/21/22 1328 10/21/22 1453 10/21/22 2011 10/22/22 1240 10/23/22 1217  GLUCAP 162* 111* 76 95 82    Iron Studies: No results for input(s): "IRON", "TIBC", "TRANSFERRIN", "FERRITIN" in the last 72 hours. '@lablastinr3'$ @ Studies/Results: No results found. Medications:  sodium chloride 10 mL/hr at 10/24/22 7096   ceFEPime (MAXIPIME) IV 1 g (10/27/22 1753)    amLODipine  10 mg Oral Daily   calcium acetate  667 mg Oral TID WC   Chlorhexidine Gluconate Cloth  6 each Topical Daily   darbepoetin (ARANESP) injection - DIALYSIS  40 mcg Subcutaneous Q Sun-1800   feeding supplement (NEPRO CARB STEADY)  237 mL Oral BID BM   gentamicin cream  1 Application Topical Daily   heparin injection (subcutaneous)  5,000 Units Subcutaneous Q8H   hydroxychloroquine  200 mg Oral QODAY   irbesartan  300 mg Oral QPM   melatonin  3 mg Oral QHS   multivitamin  1 tablet Oral QHS   pantoprazole  40 mg Oral Daily   propranolol  40 mg Oral QID   sodium chloride flush  3 mL Intravenous Q12H   spironolactone  25 mg Oral Daily    Dialysis Orders: Former PD -> transitioned to HD. Coralyn Mark.  Assessment/Plan: Polymicrobial PD Peritonitis; OP cultures showing  Klebsiella, Citrobacter, enterobacter R to cefazolin but otherwise pan-sensitive.  PD cath not functioning anyway so will convert to hemo.  D/w pt- disappointed but in agreement.  PD catheter was removed 12/21 Repeat Bcx ordered 12/20 - remain neg. On cefepime through 12/28 - please make sure available outpt HD.   TDC today followed by HD PD cath malfunction: removed, converted to HD as above ESRD: Formerly PD, converted to HD.  [place] conversation today.  Next HD 12/26. Outpt CLIP.  HTN: BP controlled, euvolemic on exam on CCB, MRB, ARB as outpt Hx/o SLE: per primary team Anemia: Hgb drifting down again to 7.6 today. No obvious bleeding.  Receiving prn Transfusions <7.  Also on ESA qSun SQ, will change to with  HD.  Jannifer Hick MD [facility] Pager [phone]

## 2022-10-28 NOTE — Progress Notes (Signed)
PROGRESS NOTE  Sheena Smith WJX:[id number] DOB: Mar 30, 1951   PCP: [name], MD  Patient is from: Home.  DOA: 10/15/2022 LOS: 25  Chief complaints Chief Complaint  Patient presents with   Abdominal Pain     Brief Narrative / Interim history: 71 y.o. F with PMH of HTN, lupus and ESRD on PD and recent diagnosis of "PD catheter infection" presenting with RLQ pain for about 5 days with associated fever, chills, nausea, diarrhea and poor appetite and admitted for acute peritonitis.  Peritoneal fluid culture grew Klebsiella pneumonia resistant to ampicillin but sensitive to cephalosporins.  Outpatient culture grew Klebsiella, Citrobacter and Enterobacter resistant to Ancef.  PD catheter has been removed.  Patient completed course of vancomycin.  She remains on IV cefepime through 10/30/2022 to complete a total of 14 days course.  Patient has HD cath for dialysis.  Plan for conversion to [facility] cath on 12/26.    Subjective: Seen and examined earlier this morning.  No major events overnight of this morning.  She reports abdominal cramps that she attributes to being hungry from n.p.o.  Feels a little nauseous.  Denies vomiting.  No further diarrhea.  Last bowel movement yesterday.  Objective: Vitals:   10/27/22 1748 10/27/22 2046 10/28/22 0528 10/28/22 0950  BP: 122/70 128/72 132/75 132/77  Pulse: 94 93 94 95  Resp: '18 18 18 18  '$ Temp: 98.9 F (37.2 C) 98.7 F (37.1 C) 98.1 F (36.7 C) 98.9 F (37.2 C)  TempSrc: Oral Oral Oral Oral  SpO2: 97% 96% 99% 97%  Weight:   51.1 kg   Height:        Examination:  GENERAL: No apparent distress.  Nontoxic. HEENT: MMM.  Vision and hearing grossly intact.  NECK: Supple.  No apparent JVD.  RESP:  No IWOB.  Fair aeration bilaterally. CVS:  RRR. Heart sounds normal.  ABD/GI/GU: BS+. Abd soft, NTND.  MSK/EXT:  Moves extremities. No apparent deformity. No edema.  SKIN: no apparent skin lesion or wound NEURO: Awake, alert and  oriented appropriately.  No apparent focal neuro deficit. PSYCH: Calm. Normal affect.    Microbiology summarized: 12/15-peritoneal fluid culture with pansensitive Klebsiella pneumonia except to ampicillin  Assessment and plan: Principal Problem:   Acute peritonitis ([place]) Active Problems:   ESRD on peritoneal dialysis ([place])   Essential hypertension   Lupus ([place])   GERD (gastroesophageal reflux disease)  Sepsis due to acute peritonitis likely from PD catheter.  Improved.  Abdominal exam reassuring. -Peritoneal catheter removed. -Peritoneal fluid culture with pansensitive Klebsiella pneumonia except ampicillin -Outpatient culture with Klebsiella, Citrobacter and Enterobacter resistant to Ancef. -Vancomycin 12/15-12/25 -IV cefepime 12/15-12/28   ESRD on PD.  Now on HD. Hyponatremia, hyperphosphatemia  Metabolic bone disease Anemia of chronic disease -Nephrology managing. -Had HD cath.  Plan for conversion to [facility] today (10/28/2022   Essential hypertension normotensive. -Continue amlodipine, irbersatan, propranolol, and spironolactone.    Lupus ([place]): Stable. -Continue with hydrochloroquine    GERD -Continue with pantoprazole.   Body mass index is 20.6 kg/m. Nutrition Problem: Inadequate oral intake Etiology: poor appetite Signs/Symptoms: meal completion < 50% Interventions: MVI, Other (Comment) ([place])   DVT prophylaxis:  heparin injection 5,000 Units Start: 10/20/22 2200 SCDs Start: 10/16/22 0437  Code Status: Full code Family Communication: None at bedside Level of care: Med-Surg Status is: Inpatient Remains inpatient appropriate because: Need for outpatient dialysis station   Final disposition: TBD Consultants:  Nephrology  Sch Meds:  Scheduled Meds:  amLODipine  10 mg  Oral Daily   calcium acetate  667 mg Oral TID WC   Chlorhexidine Gluconate Cloth  6 each Topical Daily   [START ON 10/30/2022] darbepoetin (ARANESP) injection - DIALYSIS  100 mcg  Subcutaneous Q Thu-1800   feeding supplement (NEPRO CARB STEADY)  237 mL Oral BID BM   gentamicin cream  1 Application Topical Daily   heparin injection (subcutaneous)  5,000 Units Subcutaneous Q8H   hydroxychloroquine  200 mg Oral QODAY   irbesartan  300 mg Oral QPM   melatonin  3 mg Oral QHS   multivitamin  1 tablet Oral QHS   pantoprazole  40 mg Oral Daily   propranolol  40 mg Oral QID   sodium chloride flush  3 mL Intravenous Q12H   spironolactone  25 mg Oral Daily   Continuous Infusions:  sodium chloride 10 mL/hr at 10/24/22 3295   ceFEPime (MAXIPIME) IV 1 g (10/27/22 1753)   PRN Meds:.acetaminophen **OR** acetaminophen, albuterol, alum & mag hydroxide-simeth, dicyclomine, iohexol, ondansetron (ZOFRAN) IV  Antimicrobials: Anti-infectives (From admission, onward)    Start     Dose/Rate Route Frequency Ordered Stop   10/25/22 1630  ceFEPIme (MAXIPIME) 1 g in sodium chloride 0.9 % 100 mL IVPB        1 g 200 mL/hr over 30 Minutes Intravenous Every 24 hours 10/25/22 1539 10/30/22 2359   10/24/22 1545  cefTRIAXone (ROCEPHIN) 2 g in sodium chloride 0.9 % 100 mL IVPB  Status:  Discontinued        2 g 200 mL/hr over 30 Minutes Intravenous Every 24 hours 10/24/22 1447 10/25/22 1425   10/23/22 2045  vancomycin (VANCOREADY) IVPB 500 mg/100 mL        500 mg 100 mL/hr over 60 Minutes Intravenous  Once 10/23/22 2041 10/24/22 0625   10/23/22 0315  vancomycin (VANCOCIN) IVPB 1000 mg/200 mL premix        1,000 mg 200 mL/hr over 60 Minutes Intravenous  Once 10/23/22 0223 10/23/22 0452   10/23/22 0223  vancomycin variable dose per unstable renal function (pharmacist dosing)  Status:  Discontinued         Does not apply See admin instructions 10/23/22 0223 10/27/22 1306   10/22/22 1200  ceFEPIme (MAXIPIME) 1 g in sodium chloride 0.9 % 100 mL IVPB  Status:  Discontinued        1 g 200 mL/hr over 30 Minutes Intravenous Every 24 hours 10/22/22 0751 10/24/22 1447   10/21/22 1230  ceFAZolin  (ANCEF) IVPB 1 g/50 mL premix  Status:  Discontinued        1 g 100 mL/hr over 30 Minutes Intravenous Every 24 hours 10/21/22 1218 10/22/22 0751   10/17/22 1000  ceFEPIme (MAXIPIME) 1 g in sodium chloride 0.9 % 100 mL IVPB  Status:  Discontinued        1 g 200 mL/hr over 30 Minutes Intravenous Every 24 hours 10/16/22 0954 10/21/22 1218   10/17/22 1000  hydroxychloroquine (PLAQUENIL) tablet 200 mg        200 mg Oral Every other day 10/16/22 1256     10/16/22 0954  vancomycin variable dose per unstable renal function (pharmacist dosing)  Status:  Discontinued         Does not apply See admin instructions 10/16/22 0954 10/17/22 1448   10/16/22 0430  vancomycin (VANCOCIN) IVPB 1000 mg/200 mL premix        1,000 mg 200 mL/hr over 60 Minutes Intravenous  Once 10/16/22 0426 10/16/22 0631   10/16/22  0415  ceFEPIme (MAXIPIME) 2 g in sodium chloride 0.9 % 100 mL IVPB        2 g 200 mL/hr over 30 Minutes Intravenous  Once 10/16/22 0414 10/16/22 0631        I have personally reviewed the following labs and images: CBC: Recent Labs  Lab 10/23/22 0306 10/23/22 0820 10/23/22 1600 10/24/22 0432 10/27/22 0418 10/28/22 0754  WBC 11.1*  --   --  12.1* 8.5 8.8  NEUTROABS  --   --   --   --   --  6.8  HGB 6.9* 9.2* 8.4* 8.1* 8.5* 7.6*  HCT 22.6* 27.0* 26.5* 25.9* 28.8* 24.6*  MCV 86.3  --   --  83.8 87.3 84.5  PLT 278  --   --  244 272 268   BMP &GFR Recent Labs  Lab 10/21/22 1548 10/23/22 0820 10/23/22 1600 10/27/22 0418 10/28/22 0754  NA 135 135 137 133* 134*  K 3.5 4.0 3.8 3.6 3.7  CL 95* 98 99 97* 101  CO2 20*  --  '23 23 22  '$ GLUCOSE 102* 94 85 83 92  BUN 49* 45* 51* 34* 45*  CREATININE 8.64* 8.30* 8.33* 6.98* 8.76*  CALCIUM 7.6*  --  6.8* 7.4* 7.5*  PHOS  --   --   --  5.4*  --    Estimated Creatinine Clearance: 4.8 mL/min (A) (by C-G formula based on SCr of 8.76 mg/dL (H)). Liver & Pancreas: Recent Labs  Lab 10/27/22 0418  ALBUMIN <1.5*   No results for input(s):  "LIPASE", "AMYLASE" in the last 168 hours. No results for input(s): "AMMONIA" in the last 168 hours. Diabetic: No results for input(s): "HGBA1C" in the last 72 hours. Recent Labs  Lab 10/21/22 1453 10/21/22 2011 10/22/22 1240 10/23/22 1217  GLUCAP 111* 76 95 82   Cardiac Enzymes: No results for input(s): "CKTOTAL", "CKMB", "CKMBINDEX", "TROPONINI" in the last 168 hours. No results for input(s): "PROBNP" in the last 8760 hours. Coagulation Profile: No results for input(s): "INR", "PROTIME" in the last 168 hours. Thyroid Function Tests: No results for input(s): "TSH", "T4TOTAL", "FREET4", "T3FREE", "THYROIDAB" in the last 72 hours. Lipid Profile: No results for input(s): "CHOL", "HDL", "LDLCALC", "TRIG", "CHOLHDL", "LDLDIRECT" in the last 72 hours. Anemia Panel: No results for input(s): "VITAMINB12", "FOLATE", "FERRITIN", "TIBC", "IRON", "RETICCTPCT" in the last 72 hours. Urine analysis: No results found for: "COLORURINE", "APPEARANCEUR", "LABSPEC", "PHURINE", "GLUCOSEU", "HGBUR", "BILIRUBINUR", "KETONESUR", "PROTEINUR", "UROBILINOGEN", "NITRITE", "LEUKOCYTESUR" Sepsis Labs: Invalid input(s): "PROCALCITONIN", "LACTICIDVEN"  Microbiology: Recent Results (from the past 240 hour(s))  Culture, blood (Routine X 2) w Reflex to ID Panel     Status: None   Collection Time: 10/22/22 11:13 PM   Specimen: BLOOD RIGHT HAND  Result Value Ref Range Status   Specimen Description BLOOD RIGHT HAND  Final   Special Requests   Final    BOTTLES DRAWN AEROBIC AND ANAEROBIC Blood Culture adequate volume   Culture   Final    NO GROWTH 5 DAYS Performed at [facility], [place] [address], [place], [place] [address]    Report Status 10/28/2022 FINAL  Final  Culture, blood (Routine X 2) w Reflex to ID Panel     Status: None   Collection Time: 10/22/22 11:15 PM   Specimen: BLOOD RIGHT FOREARM  Result Value Ref Range Status   Specimen Description BLOOD RIGHT FOREARM  Final   Special Requests    Final    BOTTLES DRAWN AEROBIC AND ANAEROBIC Blood Culture  adequate volume   Culture   Final    NO GROWTH 5 DAYS Performed at [facility], [place] [address], [place], [place] [address]    Report Status 10/28/2022 FINAL  Final  Aerobic/Anaerobic Culture w Gram Stain (surgical/deep wound)     Status: None   Collection Time: 10/23/22 10:15 AM   Specimen: PATH GI Other; Tissue  Result Value Ref Range Status   Specimen Description CATH TIP  Final   Special Requests NONE  Final   Gram Stain NO WBC SEEN NO ORGANISMS SEEN   Final   Culture   Final    No growth aerobically or anaerobically. Performed at [facility], [place] [address] [place],  [address]    Report Status 10/28/2022 FINAL  Final    Radiology Studies: No results found.    Zebulon Gantt T. Geiger  If 7PM-7AM, please contact night-coverage [email or web address] 10/28/2022, 1:57 PM

## 2022-10-29 DIAGNOSIS — I10 Essential (primary) hypertension: Secondary | ICD-10-CM | POA: Diagnosis not present

## 2022-10-29 DIAGNOSIS — K650 Generalized (acute) peritonitis: Secondary | ICD-10-CM | POA: Diagnosis not present

## 2022-10-29 DIAGNOSIS — M329 Systemic lupus erythematosus, unspecified: Secondary | ICD-10-CM | POA: Diagnosis not present

## 2022-10-29 DIAGNOSIS — K2100 Gastro-esophageal reflux disease with esophagitis, without bleeding: Secondary | ICD-10-CM | POA: Diagnosis not present

## 2022-10-29 MED ORDER — HEPARIN SODIUM (PORCINE) 1000 UNIT/ML IJ SOLN
INTRAMUSCULAR | Status: AC
  Filled 2022-10-29: qty 4

## 2022-10-29 NOTE — Progress Notes (Signed)
Procedure note [facility]) and renal note for today faxed to [name]. Also noted PT rec for snf. Will assist as needed.   Melven Sartorius Renal Navigator [phone]

## 2022-10-29 NOTE — Progress Notes (Signed)
[facility]                                                                               Sheena Smith, is a 71 y.o. female, DOB - [birth year], JME:[id number] Admit date - 10/15/2022    Outpatient Primary MD for the patient is Sheena Smoker, MD  LOS - 13  days    Brief summary   71 y.o. F with PMH of HTN, lupus and ESRD on PD and recent diagnosis of "PD catheter infection" presenting with RLQ pain for about 5 days with associated fever, chills, nausea, diarrhea and poor appetite and admitted for acute peritonitis.  Peritoneal fluid culture grew Klebsiella pneumonia resistant to ampicillin but sensitive to cephalosporins.  Outpatient culture grew Klebsiella, Citrobacter and Enterobacter resistant to Ancef.  PD catheter has been removed.  Patient completed course of vancomycin.  She remains on IV cefepime through 10/30/2022 to complete a total of 14 days course.   Patient has HD cath for dialysis.   she underwent [facility] cath placed on 12/26.  Therapy eval recommending SNF.   Assessment & Plan    Assessment and Plan: * Acute peritonitis ([place]) Peritoneal HD catheter related acute peritonitis.  Complicated with sepsis (present on admission).  Peritoneal fluid positive for Klebsiella pneumonia resistant to ampicillin, sensitive to cephalosporins.  Outpatient culture positive for Klebsiella, Citrobacter, enterobacter resistant to cefazolin.  PD catheter has been removed.  Plan to complete the course of IV cefepime through 12/28 to complete a 14 day course.  Therapy eval recommending SNF.   ESRD on peritoneal dialysis ([facility]) Hyponatremia, hyperphosphatemia , Metabolic bone disease. TDC catheter placed on 12/26.  Further HD as per nephrology.   Acute [place] encephalopathy.  Her mentation has improved after hemodialysis.  Encephalopathy has resolved.    Metabolic bone disease, .   Anemia of chronic disease:  - hemoglobin between 7 to 8. Transfuse to keep hemoglobin  greater than 7.   Essential hypertension Well controlled.   Lupus ([place]) Continue with hydrochloroquine  No signs of acute flare.   GERD (gastroesophageal reflux disease) Continue with pantoprazole.    RN Pressure Injury Documentation:    Malnutrition Type:  Nutrition Problem: Inadequate oral intake Etiology: poor appetite   Malnutrition Characteristics:  Signs/Symptoms: meal completion < 50%   Nutrition Interventions:  Interventions: MVI, Other (Comment) ([place])  Estimated body mass index is 20.6 kg/m as calculated from the following:   Height as of this encounter: '5\' 2"'$  (1.575 m).   Weight as of this encounter: 51.1 kg.  Code Status: full code.  DVT Prophylaxis:  heparin injection 5,000 Units Start: 10/20/22 2200 SCDs Start: 10/16/22 0437   Level of Care: Level of care: Med-Surg Family Communication: None at bedside.   Disposition Plan:     Remains inpatient appropriate:  SNF placement when bed available. Will finish IV cefepime on 12/28  Procedures:  [facility] on 12/26  Consultants:   Nephrology.   Antimicrobials:   Anti-infectives (From admission, onward)    Start     Dose/Rate Route Frequency Ordered Stop   10/25/22 1630  ceFEPIme (MAXIPIME) 1 g in sodium chloride 0.9 % 100 mL IVPB  1 g 200 mL/hr over 30 Minutes Intravenous Every 24 hours 10/25/22 1539 10/30/22 2359   10/24/22 1545  cefTRIAXone (ROCEPHIN) 2 g in sodium chloride 0.9 % 100 mL IVPB  Status:  Discontinued        2 g 200 mL/hr over 30 Minutes Intravenous Every 24 hours 10/24/22 1447 10/25/22 1425   10/23/22 2045  vancomycin (VANCOREADY) IVPB 500 mg/100 mL        500 mg 100 mL/hr over 60 Minutes Intravenous  Once 10/23/22 2041 10/24/22 0625   10/23/22 0315  vancomycin (VANCOCIN) IVPB 1000 mg/200 mL premix        1,000 mg 200 mL/hr over 60 Minutes Intravenous  Once 10/23/22 0223 10/23/22 0452   10/23/22 0223  vancomycin variable dose per unstable renal function (pharmacist dosing)   Status:  Discontinued         Does not apply See admin instructions 10/23/22 0223 10/27/22 1306   10/22/22 1200  ceFEPIme (MAXIPIME) 1 g in sodium chloride 0.9 % 100 mL IVPB  Status:  Discontinued        1 g 200 mL/hr over 30 Minutes Intravenous Every 24 hours 10/22/22 0751 10/24/22 1447   10/21/22 1230  ceFAZolin (ANCEF) IVPB 1 g/50 mL premix  Status:  Discontinued        1 g 100 mL/hr over 30 Minutes Intravenous Every 24 hours 10/21/22 1218 10/22/22 0751   10/17/22 1000  ceFEPIme (MAXIPIME) 1 g in sodium chloride 0.9 % 100 mL IVPB  Status:  Discontinued        1 g 200 mL/hr over 30 Minutes Intravenous Every 24 hours 10/16/22 0954 10/21/22 1218   10/17/22 1000  hydroxychloroquine (PLAQUENIL) tablet 200 mg        200 mg Oral Every other day 10/16/22 1256     10/16/22 0954  vancomycin variable dose per unstable renal function (pharmacist dosing)  Status:  Discontinued         Does not apply See admin instructions 10/16/22 0954 10/17/22 1448   10/16/22 0430  vancomycin (VANCOCIN) IVPB 1000 mg/200 mL premix        1,000 mg 200 mL/hr over 60 Minutes Intravenous  Once 10/16/22 0426 10/16/22 0631   10/16/22 0415  ceFEPIme (MAXIPIME) 2 g in sodium chloride 0.9 % 100 mL IVPB        2 g 200 mL/hr over 30 Minutes Intravenous  Once 10/16/22 0414 10/16/22 0631        Medications  Scheduled Meds:  amLODipine  10 mg Oral Daily   calcium acetate  667 mg Oral TID WC   Chlorhexidine Gluconate Cloth  6 each Topical Daily   [START ON 10/30/2022] darbepoetin (ARANESP) injection - DIALYSIS  100 mcg Subcutaneous Q Thu-1800   feeding supplement (NEPRO CARB STEADY)  237 mL Oral BID BM   gentamicin cream  1 Application Topical Daily   heparin injection (subcutaneous)  5,000 Units Subcutaneous Q8H   hydroxychloroquine  200 mg Oral QODAY   irbesartan  300 mg Oral QPM   melatonin  3 mg Oral QHS   multivitamin  1 tablet Oral QHS   pantoprazole  40 mg Oral Daily   propranolol  40 mg Oral QID   sodium  chloride flush  3 mL Intravenous Q12H   spironolactone  25 mg Oral Daily   Continuous Infusions:  sodium chloride 10 mL/hr at 10/24/22 9563   ceFEPime (MAXIPIME) IV 1 g (10/29/22 1641)   PRN Meds:.acetaminophen **OR** acetaminophen, albuterol, alum &  mag hydroxide-simeth, dicyclomine, iohexol, ondansetron (ZOFRAN) IV    Subjective:   Sheena Smith was seen and examined today.  Wants to work with PT.  No chest pain or sob. No nausea, vomiting.    Objective:   Vitals:   10/29/22 0416 10/29/22 0915 10/29/22 1343 10/29/22 1706  BP: 126/72 130/72 121/75 124/68  Pulse: 99 (!) 110 98 97  Resp: 20   18  Temp: 98.5 F (36.9 C) 98.4 F (36.9 C)  99.7 F (37.6 C)  TempSrc: Oral Oral  Oral  SpO2: 98% 96% 97% 95%  Weight:      Height:        Intake/Output Summary (Last 24 hours) at 10/29/2022 1803 Last data filed at 10/29/2022 1754 Gross per 24 hour  Intake 1388.83 ml  Output 2000 ml  Net -611.17 ml   Filed Weights   10/26/22 0500 10/27/22 0500 10/28/22 0528  Weight: 51.8 kg 51.5 kg 51.1 kg     Exam General exam: Appears calm and comfortable  Respiratory system: Clear to auscultation. Respiratory effort normal. Cardiovascular system: S1 & S2 heard, RRR. No JVD,  Gastrointestinal system: Abdomen is nondistended, soft and nontender. Central nervous system: Alert and oriented. No focal neurological deficits. Extremities: Symmetric 5 x 5 power. Skin: No rashes,  Psychiatry: Mood & affect appropriate.     Data Reviewed:  I have personally reviewed following labs and imaging studies   CBC Lab Results  Component Value Date   WBC 8.8 10/28/2022   RBC 2.91 (L) 10/28/2022   HGB 7.6 (L) 10/28/2022   HCT 24.6 (L) 10/28/2022   MCV 84.5 10/28/2022   MCH 26.1 10/28/2022   PLT 268 10/28/2022   MCHC 30.9 10/28/2022   RDW 18.1 (H) 10/28/2022   LYMPHSABS 0.8 10/28/2022   MONOABS 0.8 10/28/2022   EOSABS 0.2 10/28/2022   BASOSABS 0.0 43/15/4008     Last metabolic  panel Lab Results  Component Value Date   NA 134 (L) 10/28/2022   K 3.7 10/28/2022   CL 101 10/28/2022   CO2 22 10/28/2022   BUN 45 (H) 10/28/2022   CREATININE 8.76 (H) 10/28/2022   GLUCOSE 92 10/28/2022   GFRNONAA 5 (L) 10/28/2022   GFRAA 5 (L) 05/21/2020   CALCIUM 7.5 (L) 10/28/2022   PHOS 5.4 (H) 10/27/2022   PROT 6.8 10/16/2022   ALBUMIN <1.5 (L) 10/27/2022   LABGLOB 3.0 05/21/2020   AGRATIO 1.1 (L) 05/21/2020   BILITOT 2.3 (H) 10/16/2022   ALKPHOS 48 10/16/2022   AST 15 10/16/2022   ALT 10 10/16/2022   ANIONGAP 11 10/28/2022    CBG (last 3)  No results for input(s): "GLUCAP" in the last 72 hours.    Coagulation Profile: No results for input(s): "INR", "PROTIME" in the last 168 hours.   Radiology Studies: No results found.     Hosie Poisson M.D. [facility] 10/29/2022, 6:03 PM  Available via Epic secure chat 7am-7pm After 7 pm, please refer to night coverage provider listed on amion.

## 2022-10-29 NOTE — Evaluation (Signed)
Physical Therapy Evaluation Patient Details Name: Sheena Smith MRN: [id number] DOB: 07/11/1951 Today's Date: 10/29/2022  History of Present Illness  71 yo female with onset of abd pain, fever and swelling was found to have sepsis from PD cath site, metabolic acidosis, anemia and elevated anion gap.  Had a missed HD session as well, admitted for follow up to intra-abdominal ABT.  PMHx:  ESRD, HTN, lupus, renal failure,  Clinical Impression  Pt was seen for mobility on RW to get around a short trip in her room, requiring a seated rest to complete it.  Her plan for home is to get rehab first and then to return to family being intermittently assistive of her.  Pt is on RW after being fairly independent with no AD, and now requires supervised help to walk to avoid a fall.  Will plan to work with her pre-discharge to get strength and balance back to level of stability that can be built on for rehab placement.  Follow acutely for goals of PT and focus on standing balance, standing endurance and safety with gait.       Recommendations for follow up therapy are one component of a multi-disciplinary discharge planning process, led by the attending physician.  Recommendations may be updated based on patient status, additional functional criteria and insurance authorization.  Follow Up Recommendations Skilled nursing-short term rehab (<3 hours/day) Can patient physically be transported by private vehicle: No    Assistance Recommended at Discharge Intermittent Supervision/Assistance  Patient can return home with the following  A little help with walking and/or transfers;A little help with bathing/dressing/bathroom;Assistance with cooking/housework;Assist for transportation;Help with stairs or ramp for entrance    Equipment Recommendations None recommended by PT  Recommendations for Other Services       Functional Status Assessment Patient has had a recent decline in their functional status and  demonstrates the ability to make significant improvements in function in a reasonable and predictable amount of time.     Precautions / Restrictions Precautions Precautions: Fall      Mobility  Bed Mobility Overal bed mobility: Modified Independent             General bed mobility comments: requires use of bed rail briefly    Transfers Overall transfer level: Needs assistance Equipment used: Rolling walker (2 wheels), 1 person hand held assist Transfers: Sit to/from Stand Sit to Stand: Min guard, Min assist           General transfer comment: minor steadying assist    Ambulation/Gait Ambulation/Gait assistance: Min guard, Min assist Gait Distance (Feet): 18 Feet (9+9) Assistive device: Rolling walker (2 wheels), 1 person hand held assist Gait Pattern/deviations: Step-through pattern, Step-to pattern, Decreased stride length, Wide base of support, Trunk flexed Gait velocity: reduced Gait velocity interpretation: <1.31 ft/sec, indicative of household ambulator Pre-gait activities: standing balance ck General Gait Details: minor support and assist with RW, requires sitting rest half way through walk  Stairs            Wheelchair Mobility    Modified Rankin (Stroke Patients Only)       Balance Overall balance assessment: Needs assistance Sitting-balance support: Feet supported Sitting balance-Leahy Scale: Fair     Standing balance support: Bilateral upper extremity supported, During functional activity Standing balance-Leahy Scale: Poor Standing balance comment: limited standing endurance and requiring close guard for sudden fatigue  Pertinent Vitals/Pain Pain Assessment Pain Assessment: Faces Faces Pain Scale: Hurts a little bit Pain Location: abdomen Pain Descriptors / Indicators: Tender Pain Intervention(s): Monitored during session, Repositioned    Home Living Family/patient expects to be discharged  to:: Private residence Living Arrangements: Alone Available Help at Discharge: Family;Available PRN/intermittently;Available 24 hours/day Type of Home: House Home Access: Stairs to enter   [employer] of Steps: 8   Home Layout: One level Home Equipment: None Additional Comments: has lived independently with no AD needed    Prior Function Prior Level of Function : Independent/Modified Independent             Mobility Comments: no AD needed ADLs Comments: able to care for herself     Hand Dominance   Dominant Hand: Right    Extremity/Trunk Assessment   Upper Extremity Assessment Upper Extremity Assessment: Overall WFL for tasks assessed    Lower Extremity Assessment Lower Extremity Assessment: Generalized weakness    Cervical / Trunk Assessment Cervical / Trunk Assessment: Kyphotic (mild)  Communication   Communication: No difficulties  Cognition Arousal/Alertness: Awake/alert Behavior During Therapy: WFL for tasks assessed/performed Overall Cognitive Status: Within Functional Limits for tasks assessed                                 General Comments: pt discusses her setup and help with family for home        General Comments General comments (skin integrity, edema, etc.): pt is up to walk with help and for management of fatigue and safety, with reminders for safety and use of walker as this is not a familiar device    Exercises     Assessment/Plan    PT Assessment Patient needs continued PT services  PT Problem List Decreased strength;Decreased activity tolerance;Decreased balance;Decreased mobility;Decreased knowledge of use of DME;Pain       PT Treatment Interventions DME instruction;Gait training;Stair training;Functional mobility training;Therapeutic activities;Therapeutic exercise;Balance training;Neuromuscular re-education;Patient/family education    PT Goals (Current goals can be found in the Care Plan section)  Acute  Rehab PT Goals Patient Stated Goal: to get stronger and get home PT Goal Formulation: With patient Time For Goal Achievement: 11/12/22 Potential to Achieve Goals: Good    Frequency Min 2X/week     Co-evaluation               AM-PAC PT "6 Clicks" Mobility  Outcome Measure Help needed turning from your back to your side while in a flat bed without using bedrails?: A Little Help needed moving from lying on your back to sitting on the side of a flat bed without using bedrails?: A Little Help needed moving to and from a bed to a chair (including a wheelchair)?: A Little Help needed standing up from a chair using your arms (e.g., wheelchair or bedside chair)?: A Little Help needed to walk in hospital room?: A Little Help needed climbing 3-5 steps with a railing? : A Little 6 Click Score: 18    End of Session Equipment Utilized During Treatment: Gait belt Activity Tolerance: Patient limited by pain;Patient limited by fatigue Patient left: in bed;with call bell/phone within reach;with bed alarm set Nurse Communication: Mobility status PT Visit Diagnosis: Unsteadiness on feet (R26.81);Muscle weakness (generalized) (M62.81);Difficulty in walking, not elsewhere classified (R26.2)    Time: 6433-2951 PT Time Calculation (min) (ACUTE ONLY): 32 min   Charges:   PT Evaluation $PT Eval Moderate Complexity: 1 Mod PT  Treatments $Gait Training: 8-22 mins       Ramond Dial 10/29/2022, 4:16 PM  Mee Hives, PT PhD Acute Rehab Dept. Number: [place]

## 2022-10-29 NOTE — Progress Notes (Signed)
[facility] Progress Note   Subjective:   Seen in room.  Had [facility] placed yesterday then HD late in day.  Feels weak - requesting PT eval prior to d/c.    Objective Vitals:   10/29/22 0107 10/29/22 0123 10/29/22 0416 10/29/22 0915  BP:  132/81 126/72 130/72  Pulse: (!) 114 (!) 108 99 (!) 110  Resp: (!) 25 (!) 24 20   Temp:  98.8 F (37.1 C) 98.5 F (36.9 C) 98.4 F (36.9 C)  TempSrc:  Oral Oral Oral  SpO2: 100% 100% 98% 96%  Weight:      Height:       Physical Exam General: Alert female in NAD Heart: RRR, no murmurs, rubs or gallops Lungs: CTA bilaterally Abdomen: PD catheter removed, +BS  Extremities: No edema b/l lower extremities Dialysis Access: tunneled R IJ catheter in place c/d/i  Additional Objective Labs: Basic Metabolic Panel: Recent Labs  Lab 10/23/22 1600 10/27/22 0418 10/28/22 0754  NA 137 133* 134*  K 3.8 3.6 3.7  CL 99 97* 101  CO2 '23 23 22  '$ GLUCOSE 85 83 92  BUN 51* 34* 45*  CREATININE 8.33* 6.98* 8.76*  CALCIUM 6.8* 7.4* 7.5*  PHOS  --  5.4*  --     Liver Function Tests: Recent Labs  Lab 10/27/22 0418  ALBUMIN <1.5*    No results for input(s): "LIPASE", "AMYLASE" in the last 168 hours. CBC: Recent Labs  Lab 10/23/22 0306 10/23/22 0820 10/24/22 0432 10/27/22 0418 10/28/22 0754  WBC 11.1*  --  12.1* 8.5 8.8  NEUTROABS  --   --   --   --  6.8  HGB 6.9*   < > 8.1* 8.5* 7.6*  HCT 22.6*   < > 25.9* 28.8* 24.6*  MCV 86.3  --  83.8 87.3 84.5  PLT 278  --  244 272 268   < > = values in this interval not displayed.    Blood Culture    Component Value Date/Time   SDES CATH TIP 10/23/2022 1015   SPECREQUEST NONE 10/23/2022 1015   CULT  10/23/2022 1015    No growth aerobically or anaerobically. Performed at [facility], [place] [address] [place], [place] [address]    REPTSTATUS 10/28/2022 FINAL 10/23/2022 1015    Cardiac Enzymes: No results for input(s): "CKTOTAL", "CKMB", "CKMBINDEX", "TROPONINI" in the last  168 hours. CBG: Recent Labs  Lab 10/22/22 1240 10/23/22 1217  GLUCAP 95 82    Iron Studies: No results for input(s): "IRON", "TIBC", "TRANSFERRIN", "FERRITIN" in the last 72 hours. '@lablastinr3'$ @ Studies/Results: No results found. Medications:  sodium chloride 10 mL/hr at 10/24/22 4580   ceFEPime (MAXIPIME) IV 1 g (10/28/22 1737)    amLODipine  10 mg Oral Daily   calcium acetate  667 mg Oral TID WC   Chlorhexidine Gluconate Cloth  6 each Topical Daily   [START ON 10/30/2022] darbepoetin (ARANESP) injection - DIALYSIS  100 mcg Subcutaneous Q Thu-1800   feeding supplement (NEPRO CARB STEADY)  237 mL Oral BID BM   gentamicin cream  1 Application Topical Daily   heparin injection (subcutaneous)  5,000 Units Subcutaneous Q8H   heparin sodium (porcine)       hydroxychloroquine  200 mg Oral QODAY   irbesartan  300 mg Oral QPM   melatonin  3 mg Oral QHS   multivitamin  1 tablet Oral QHS   pantoprazole  40 mg Oral Daily   propranolol  40 mg Oral QID  sodium chloride flush  3 mL Intravenous Q12H   spironolactone  25 mg Oral Daily    Dialysis Orders: Former PD -> transitioned to HD. [name].  Assessment/Plan: Polymicrobial PD Peritonitis; OP cultures showing  Klebsiella, Citrobacter, enterobacter R to cefazolin but otherwise pan-sensitive.  PD cath not functioning anyway so will convert to hemo.  D/w pt- disappointed but in agreement.  PD catheter was removed 12/21 Repeat Bcx ordered 12/20 - remain neg. On cefepime through 12/28 - will get last dose here.   TDC today followed by HD PD cath malfunction: removed, converted to HD as above ESRD: Formerly PD, converted to HD.  [place] conversion 12/26.  Next HD 12/28. Outpt CLIP -davita [name] arranged HTN: BP controlled, euvolemic on exam on CCB, MRB, ARB as outpt Hx/o SLE: per primary team Anemia: Hgb drifting down again to 7.6 yest. No obvious bleeding.  Receiving prn Transfusions <7.  Also on ESA. Will plan for HD in the AM  anticipating d/c tomorrow.   Jannifer Hick MD [facility] Pager [phone]

## 2022-10-30 DIAGNOSIS — K2100 Gastro-esophageal reflux disease with esophagitis, without bleeding: Secondary | ICD-10-CM | POA: Diagnosis not present

## 2022-10-30 DIAGNOSIS — K650 Generalized (acute) peritonitis: Secondary | ICD-10-CM | POA: Diagnosis not present

## 2022-10-30 DIAGNOSIS — N186 End stage renal disease: Secondary | ICD-10-CM | POA: Diagnosis not present

## 2022-10-30 DIAGNOSIS — I10 Essential (primary) hypertension: Secondary | ICD-10-CM | POA: Diagnosis not present

## 2022-10-30 HISTORY — PX: IR US GUIDE VASC ACCESS RIGHT: IMG2390

## 2022-10-30 MED ORDER — HEPARIN SODIUM (PORCINE) 1000 UNIT/ML IJ SOLN
INTRAMUSCULAR | Status: AC
  Administered 2022-10-30: 3200 [IU]
  Filled 2022-10-30: qty 4

## 2022-10-30 MED ORDER — DARBEPOETIN ALFA 100 MCG/0.5ML IJ SOSY
100.0000 ug | PREFILLED_SYRINGE | INTRAMUSCULAR | Status: DC
  Administered 2022-11-02: 100 ug via SUBCUTANEOUS
  Filled 2022-10-30: qty 0.5

## 2022-10-30 MED ORDER — LEVOFLOXACIN IN D5W 750 MG/150ML IV SOLN: 750.0000 mg | Freq: Once | INTRAVENOUS | Status: DC

## 2022-10-30 MED ORDER — CIPROFLOXACIN HCL 500 MG PO TABS
500.0000 mg | ORAL_TABLET | Freq: Once | ORAL | Status: AC
  Administered 2022-10-30: 500 mg via ORAL
  Filled 2022-10-30: qty 1

## 2022-10-30 NOTE — Progress Notes (Signed)
[facility] Progress Note   Subjective:   Seen in dialysis.  Tolerating tx fine.     Objective Vitals:   10/30/22 0527 10/30/22 0851 10/30/22 0907 10/30/22 0930  BP: 122/68 124/71 127/69 126/72  Pulse: 88 80 85 86  Resp: '18 16  19  '$ Temp: 98.7 F (37.1 C) (!) 97.1 F (36.2 C)    TempSrc: Oral Temporal    SpO2: 100% 100% 100% 97%  Weight:      Height:       Physical Exam General: Alert female in NAD Heart: RRR, no murmurs, rubs or gallops Lungs: CTA bilaterally Abdomen: PD catheter removed, +BS  Extremities: No edema b/l lower extremities Dialysis Access: tunneled R IJ catheter in place c/d/i  Additional Objective Labs: Basic Metabolic Panel: Recent Labs  Lab 10/23/22 1600 10/27/22 0418 10/28/22 0754  NA 137 133* 134*  K 3.8 3.6 3.7  CL 99 97* 101  CO2 '23 23 22  '$ GLUCOSE 85 83 92  BUN 51* 34* 45*  CREATININE 8.33* 6.98* 8.76*  CALCIUM 6.8* 7.4* 7.5*  PHOS  --  5.4*  --     Liver Function Tests: Recent Labs  Lab 10/27/22 0418  ALBUMIN <1.5*    No results for input(s): "LIPASE", "AMYLASE" in the last 168 hours. CBC: Recent Labs  Lab 10/24/22 0432 10/27/22 0418 10/28/22 0754  WBC 12.1* 8.5 8.8  NEUTROABS  --   --  6.8  HGB 8.1* 8.5* 7.6*  HCT 25.9* 28.8* 24.6*  MCV 83.8 87.3 84.5  PLT 244 272 268    Blood Culture    Component Value Date/Time   SDES CATH TIP 10/23/2022 1015   SPECREQUEST NONE 10/23/2022 1015   CULT  10/23/2022 1015    No growth aerobically or anaerobically. Performed at [facility], [place] [address], [place], [place] [address]    REPTSTATUS 10/28/2022 FINAL 10/23/2022 1015    Cardiac Enzymes: No results for input(s): "CKTOTAL", "CKMB", "CKMBINDEX", "TROPONINI" in the last 168 hours. CBG: Recent Labs  Lab 10/23/22 1217  GLUCAP 82    Iron Studies: No results for input(s): "IRON", "TIBC", "TRANSFERRIN", "FERRITIN" in the last 72 hours. '@lablastinr3'$ @ Studies/Results: IR Fluoro Guide CV Line  Right  Result Date: 10/30/2022 CLINICAL DATA:  Renal insufficiency, needs access for hemodialysis EXAM: TUNNELED HEMODIALYSIS CATHETER PLACEMENT WITH ULTRASOUND AND FLUOROSCOPIC GUIDANCE TECHNIQUE: The procedure, risks, benefits, and alternatives were explained to the patient. Questions regarding the procedure were encouraged and answered. The patient understands and consents to the procedure. patency of the right IJ vein was confirmed with ultrasound with image documentation. An appropriate skin site was determined. Region was prepped using maximum barrier technique including cap and mask, sterile gown, sterile gloves, large sterile sheet, and Chlorhexidine as cutaneous antisepsis. The region was infiltrated locally with 1% lidocaine. Intravenous Fentanyl 49mg and Versed '1mg'$  were administered as conscious sedation during continuous monitoring of the patient's level of consciousness and physiological / cardiorespiratory status by the radiology RN, with a total moderate sedation time of 14 minutes. Under real-time ultrasound guidance, the right IJ vein was accessed with a 21 gauge micropuncture needle; the needle tip within the vein was confirmed with ultrasound image documentation. Needle exchanged over the 018 guidewire for transitional dilator, which allowed advancement of a Benson wire into the IVC. Over this, an MPA catheter was advanced. A Palindrome 19 hemodialysis catheter was tunneled from the right anterior chest wall approach to the right IJ dermatotomy site. The MPA catheter was exchanged  over an Amplatz wire for serial vascular dilators which allow placement of a peel-away sheath, through which the catheter was advanced under intermittent fluoroscopy, positioned with its tips in the proximal and midright atrium. Spot chest radiograph confirms good catheter position. No pneumothorax. Catheter was flushed and primed per protocol. Catheter secured externally with O Prolene sutures. The right IJ  dermatotomy site was closed with Dermabond. COMPLICATIONS: COMPLICATIONS None immediate FLUOROSCOPY: Radiation Exposure Index (as provided by the fluoroscopic device): Less than 0.1 mGy air Kerma COMPARISON:  None Available. IMPRESSION: 1. Technically successful placement of tunneled right IJ hemodialysis catheter with ultrasound and fluoroscopic guidance. Ready for routine use. ACCESS: Remains approachable for percutaneous intervention as needed. Electronically Signed   By: Lucrezia Europe M.D.   On: 10/30/2022 09:19   Medications:  sodium chloride 10 mL/hr at 10/24/22 4481   ceFEPime (MAXIPIME) IV Stopped (10/29/22 1745)    amLODipine  10 mg Oral Daily   calcium acetate  667 mg Oral TID WC   Chlorhexidine Gluconate Cloth  6 each Topical Daily   darbepoetin (ARANESP) injection - DIALYSIS  100 mcg Subcutaneous Q Thu-1800   feeding supplement (NEPRO CARB STEADY)  237 mL Oral BID BM   gentamicin cream  1 Application Topical Daily   heparin injection (subcutaneous)  5,000 Units Subcutaneous Q8H   hydroxychloroquine  200 mg Oral QODAY   irbesartan  300 mg Oral QPM   melatonin  3 mg Oral QHS   multivitamin  1 tablet Oral QHS   pantoprazole  40 mg Oral Daily   propranolol  40 mg Oral QID   sodium chloride flush  3 mL Intravenous Q12H   spironolactone  25 mg Oral Daily    Dialysis Orders: Former PD -> transitioned to HD. [name].  Assessment/Plan: Polymicrobial PD Peritonitis; OP cultures showing  Klebsiella, Citrobacter, enterobacter R to cefazolin but otherwise pan-sensitive.  PD cath not functioning anyway so will convert to hemo.  D/w pt- disappointed but in agreement.  PD catheter was removed 12/21 Repeat Bcx ordered 12/20 - remain neg. On cefepime through 12/28 - will get last dose here today.   PD cath malfunction: removed, converted to HD as above ESRD: Formerly PD, converted to HD.  [place] conversion 12/26.  Next HD 12/28. Outpt CLIP -davita [name] arranged HTN: BP controlled,  euvolemic on exam on CCB, MRB, ARB as outpt Hx/o SLE: per primary team Anemia: Hgb drifting down again to 7.6 yest. No obvious bleeding.  Receiving prn Transfusions <7.  Also on ESA.  [place] for d/c from my perspective.  She says PT recommended SNF.     Jannifer Hick MD [facility] Pager [phone]

## 2022-10-30 NOTE — NC FL2 (Signed)
[place] LEVEL OF CARE FORM     IDENTIFICATION  Patient Name: Sheena Smith Birthdate: January 20, 1951 Sex: female Admission Date (Current Location): 10/15/2022  [facility] and [place] Number:  Herbalist and Address:  [facility], [place] [address] [place], [place] [address]      Provider Number: [id number]  Attending Physician Name and Address:  Mariel Aloe, MD  Relative Name and Phone Number:  Hodges,Courtnee (Daughter) [phone]    Current Level of Care: Hospital Recommended Level of Care: [place] Prior Approval Number:    Date Approved/Denied:   PASRR Number: [id number]  Discharge Plan: SNF    Current Diagnoses: Patient Active Problem List   Diagnosis Date Noted   Peritonitis ([place]) 10/17/2022   Acute peritonitis ([place]) 10/16/2022   Sepsis ([place]) 10/16/2022   ESRD on peritoneal dialysis ([place]) 36/14/4315   Metabolic acidosis with increased anion gap and accumulation of organic acids 10/16/2022   Anemia due to chronic kidney disease 10/16/2022   Essential hypertension 10/16/2022   Lupus ([place]) 10/16/2022   GERD (gastroesophageal reflux disease) 10/16/2022    Orientation RESPIRATION BLADDER Height & Weight     Place, Situation, Time, Self  Normal Continent Weight: 113 lb 8.6 oz (51.5 kg) Height:  '5\' 2"'$  (157.5 cm)  BEHAVIORAL SYMPTOMS/MOOD NEUROLOGICAL BOWEL NUTRITION STATUS      Continent Diet (see d/c summary)  AMBULATORY STATUS COMMUNICATION OF NEEDS Skin   Limited Assist Verbally Surgical wounds                       Personal Care Assistance Level of Assistance  Feeding, Bathing, Dressing Bathing Assistance: Limited assistance Feeding assistance: Independent Dressing Assistance: Limited assistance     Functional Limitations Info  Sight, Hearing, Speech Sight Info: Adequate Hearing Info: Adequate Speech Info: Adequate    SPECIAL CARE FACTORS FREQUENCY  PT (By licensed PT), OT  (By licensed OT)     PT Frequency: 5x/ week OT Frequency: 5x/ week            Contractures Contractures Info: Not present    Additional Factors Info  Code Status, Allergies Code Status Info: Full Allergies Info: Nsaids  Other  Atenolol  Elemental Sulfur           Current Medications (10/30/2022):  This is the current hospital active medication list Current Facility-Administered Medications  Medication Dose Route Frequency Provider Last Rate Last Admin   0.9 %  sodium chloride infusion   Intravenous Continuous Ileana Roup, MD 10 mL/hr at 10/24/22 0627 New Bag at 10/24/22 0627   acetaminophen (TYLENOL) tablet 650 mg  650 mg Oral Q6H PRN Ileana Roup, MD   650 mg at 10/28/22 2123   Or   acetaminophen (TYLENOL) suppository 650 mg  650 mg Rectal Q6H PRN Ileana Roup, MD       albuterol (PROVENTIL) (2.5 MG/3ML) 0.083% nebulizer solution 2.5 mg  2.5 mg Nebulization Q6H PRN Ileana Roup, MD       alum & mag hydroxide-simeth (MAALOX/MYLANTA) 200-200-20 MG/5ML suspension 30 mL  30 mL Oral Q6H PRN Ileana Roup, MD   30 mL at 10/23/22 1508   amLODipine (NORVASC) tablet 10 mg  10 mg Oral Daily Ileana Roup, MD   10 mg at 10/30/22 1233   calcium acetate (PHOSLO) capsule 667 mg  667 mg Oral TID WC Tawni Millers, MD   667 mg at 10/30/22 1233  ceFEPIme (MAXIPIME) 1 g in sodium chloride 0.9 % 100 mL IVPB  1 g Intravenous Q24H Arrien, Jimmy Picket, MD   Stopped at 10/29/22 1745   Chlorhexidine Gluconate Cloth 2 % PADS 6 each  6 each Topical Daily Ileana Roup, MD   6 each at 10/30/22 0730   Darbepoetin Alfa (ARANESP) injection 100 mcg  100 mcg Subcutaneous Q Thu-1800 Justin Mend, MD       dicyclomine (BENTYL) capsule 10 mg  10 mg Oral TID PRN Tawni Millers, MD   10 mg at 10/29/22 2023   feeding supplement (NEPRO CARB STEADY) liquid 237 mL  237 mL Oral BID BM Arrien, Jimmy Picket, MD   237 mL at 10/30/22 1238    gentamicin cream (GARAMYCIN) 0.1 % 1 Application  1 Application Topical Daily Ileana Roup, MD   1 Application at 49/67/59 1737   heparin injection 5,000 Units  5,000 Units Subcutaneous Q8H Ileana Roup, MD   5,000 Units at 10/30/22 1232   hydroxychloroquine (PLAQUENIL) tablet 200 mg  200 mg Oral Melanie Crazier, MD   200 mg at 10/29/22 1037   iohexol (OMNIPAQUE) 300 MG/ML solution 100 mL  100 mL Intravenous Once PRN Ileana Roup, MD       irbesartan (AVAPRO) tablet 300 mg  300 mg Oral QPM Ileana Roup, MD   300 mg at 10/29/22 1755   melatonin tablet 3 mg  3 mg Oral QHS Ileana Roup, MD   3 mg at 10/29/22 2130   multivitamin (RENA-VIT) tablet 1 tablet  1 tablet Oral QHS Tawni Millers, MD   1 tablet at 10/29/22 2231   ondansetron (ZOFRAN) injection 4 mg  4 mg Intravenous Q6H PRN Ileana Roup, MD   4 mg at 10/28/22 0751   pantoprazole (PROTONIX) EC tablet 40 mg  40 mg Oral Daily Arrien, Jimmy Picket, MD   40 mg at 10/30/22 1234   propranolol (INDERAL) tablet 40 mg  40 mg Oral QID Ileana Roup, MD   40 mg at 10/30/22 1233   sodium chloride flush (NS) 0.9 % injection 3 mL  3 mL Intravenous Q12H Ileana Roup, MD   3 mL at 10/30/22 0730   spironolactone (ALDACTONE) tablet 25 mg  25 mg Oral Daily Ileana Roup, MD   25 mg at 10/30/22 1233     Discharge Medications: Please see discharge summary for a list of discharge medications.  Relevant Imaging Results:  Relevant Lab Results:   Additional Information SSN:  [id number]  (Dialysis- [name] TTS 10:15 [facility] are willing to transport patient to and from dialysis each day)  Milinda Antis, LCSWA

## 2022-10-30 NOTE — Progress Notes (Signed)
Received patient in bed to unit.  Alert and oriented.  Informed consent signed and in chart.   Treatment initiated: 0907 Treatment completed: 1206  Patient tolerated well.  Transported back to the room  Alert, without acute distress.  Hand-off given to patient's nurse.   Access used: Catheter  Access issues: none  Total UF removed: 2L Medication(s) given: None Post HD VS: 97.2,126/75,89,22,99% Post HD weight: 48.5kg   [facility]

## 2022-10-30 NOTE — Plan of Care (Signed)

## 2022-10-30 NOTE — TOC Initial Note (Addendum)
Transition of Care [facility]) - Initial/Assessment Note    Patient Details  Name: Sheena Smith MRN: [id number] Date of Birth: 23-Dec-1950  [facility] of [facility]) CM/SW Contact:    Milinda Antis, [place] Phone Number: 10/30/2022, 3:15 PM  Clinical Narrative:                 CSW received consult for possible SNF placement at time of discharge. CSW spoke with patient at bedside.  The patient's daughter Imagene Sheller was present via phone.  Patient expressed understanding of PT recommendation and is agreeable to SNF placement at time of discharge. Patient and family reports preference for [facility]. Patient and family agreed for LCSW to send referral to other facilities in the area as well.  LCSW discussed insurance authorization process and will provide Medicare SNF ratings list. CSW will send out referrals for review and provide bed offers as available.   Skilled Nursing Rehab Facilities-   [email or web address]   Ratings out of 5 stars (5 the highest)   Name Address  Phone # [place] Inspection Overall  [facility] [address] [place] '4 5 2 3  '$ [facility]  [address] [place], [place] [phone] '4 2 5 5  '$ [facility] [place], [place] '1 3 1 1  '$ [facility], [place] '2 2 4 4  '$ [facility] [address] [place] '2 1 2 1  '$ [facility] '3 3 4 4  '$ [facility] [address] [place] '4 1 3 2  '$ [facility] [address] [place] '4 1 3 2  '$ [address] ([place]) [place], [place] [phone] '3 1 2 1  '$ [facility] [phone], [place] [phone] '3 1 1 1  '$ [facility] [address] [facility] [phone] '3 2 2 2  '$ [facility] [place]. [place], [place] [phone] '3  1 1 1  '$ Dustin Flock [address] [phone] '4 2 4 4          '$ [place] '4 1 3 2  '$ [facility] [place], [place] '3 1 5 4  '$ [facility], [place] [address], [place] [phone] '1 1 2 1  '$ [facility] [address], [place] [phone] '2 2 4 4          '$ [place] (no [facility]) [place] [facility], [place] [phone] '5 5 5 5  '$ [facility] (No Humana) [address], [place] '4 1 4 3  '$ [facility] (No UHC) [place], [address] '5 5 5 5  '$ [facility] [address] [facility] [phone] '2 3 5 5  '$ [place] [address], [place] '1 1 2 1  '$ [place] [address] '3 1 1 1  '$ [place] [facility] [place], [place] '5 2 5 5  '$ [facility]  [address] [place] '2 2 1 1  '$ [facility] [address], [place] '3 2 1 1  '$ [facility] [place], [place] '2 2 2 2          '$ [facility] [address] [place] '1 1 1 1  '$ [place]  Dr, Ellender Hose  [phone] '2 4 3 3  '$ [facility] [place] [address], [place] [phone] '3 2 3 3  '$ [place] [address] [place] '2 1 1 1  '$ [place] (No Humana) [address], [place] [phone] '2 2 3 3  '$ [facility]) [address], [place] [phone] '2 1 1 1          '$ [facility] [place], [place] '5 4 5 5  '$ [facility])  [address], [place] '2 1 2 1  '$ [facility]) Rafael Capo [address] [place] '3 1 4 3  '$ [place] [address], [place] '3 3 4 4  '$ [address] [place], [place] '2 3 1 1  '$ [facility]) [address] [place] [phone] '2 1 4 3    '$ Expected Discharge Plan: [place] Barriers to Discharge: Insurance Authorization, SNF Pending bed offer   Patient Goals and CMS Choice Patient states their goals for this hospitalization and ongoing recovery are:: To go to rehab to regain strength CMS Medicare.gov Compare Post Acute Care list provided to:: Patient Choice offered to / list presented to : Patient, Adult Children      Expected Discharge Plan and Services       Living arrangements for the past 2 months: Single Family Home                                      Prior Living Arrangements/Services Living arrangements for the past 2 months: Single Family Home Lives with:: Self Patient language and need for interpreter reviewed:: Yes Do you feel safe going back to the place where you live?: Yes      Need for Family Participation in Patient Care: Yes (Comment) Care giver support system in place?: Yes (comment)   Criminal Activity/Legal Involvement Pertinent to Current Situation/Hospitalization: No - Comment as needed  Activities of Daily Living Home Assistive Devices/Equipment: Shower chair without back ADL Screening (condition at time of admission) Patient's cognitive ability adequate to safely complete daily activities?: Yes Is the patient deaf or have difficulty hearing?: No Does the patient have difficulty seeing, even when wearing glasses/contacts?: No Does the patient have difficulty concentrating, remembering, or making decisions?: No Patient able to express need for assistance with ADLs?: Yes Does the patient have difficulty dressing or bathing?: No Independently performs ADLs?: Yes (appropriate for developmental age) Does the patient have difficulty walking or climbing stairs?: No Weakness of Legs: None Weakness of Arms/Hands: None  Permission  Sought/Granted Permission sought to share information with : Facility Art therapist granted to share information with : Yes, Verbal Permission Granted  Share Information with NAME: daughters  Permission granted to share info w AGENCY: SNFs        Emotional Assessment Appearance:: Appears older than stated age Attitude/Demeanor/Rapport: Engaged Affect (typically observed): Accepting Orientation: : Oriented to Situation, Oriented to  Time, Oriented to Place, Oriented to Self Alcohol / Substance Use: Not Applicable Psych Involvement: No (comment)  Admission diagnosis:  Acute peritonitis ([place]) [K65.0] Peritonitis ([place]) [K65.9] Patient Active Problem List   Diagnosis Date Noted   Peritonitis ([place]) 10/17/2022   Acute peritonitis ([place]) 10/16/2022   Sepsis ([place]) 10/16/2022   ESRD on peritoneal dialysis ([place]) 38/93/7342   Metabolic acidosis with increased  anion gap and accumulation of organic acids 10/16/2022   Anemia due to chronic kidney disease 10/16/2022   Essential hypertension 10/16/2022   Lupus ([place]) 10/16/2022   GERD (gastroesophageal reflux disease) 10/16/2022   PCP:  [name], MD Pharmacy[id number] [address] [place]Phone: [phone]: [phone] [id number] [place]hone: [phone]: [phone]   Social Determinants of Health (SDOH) Social History: SDGlen FerrisNo Food Insecurity (10/17/2022)  Housing: Low Risk  (10/17/2022)  Transportation Needs: No Transportation Needs (10/17/2022)  Utilities: Not At Risk (10/17/2022)  Tobacco Use: Medium Risk (10/30/2022)   SDOH Interventions: Transportation Interventions: Intervention Not Indicated, Inpatient TOC, Patient Resources (Friends/Family)   Readmission Risk Interventions     No  data to display

## 2022-10-30 NOTE — Progress Notes (Signed)
PROGRESS NOTE    Sheena Smith  YHC:[id number] DOB: July 21, 1951 DOA: 10/15/2022 PCP: [name], MD   Brief Narrative: Sheena Smith is a 71 y.o. female with a history of hypertension, ESRD on PD and lupus. Patient presented secondary to abdominal pain and was found to have evidence of acute peritonitis related to peritoneal dialysis. Patient started on empiric Vancomycin/Cefepime and transitioned to hemodialysis. Patient was transitioned to Cefepime IV monotherapy for treatment of Klebsiella pneumoniae infection, however outpatient cultures also grew citrobacter werkmanii and enterobacter cloacae. PD catheter removed on 12/20. Tunneled HD catheter placed on 12/26. Patient stable for discharge to SNF once bed is available.   Assessment and Plan:  Klebsiella pneumoniae peritonitis In setting of peritoneal dialysis history. Patient started on empiric Vancomycin/Cefepime. Body fluid culture significant for ampicillin resistant klebsiella pneumoniae infection. Blood cultures negative. PD catheter removed on 12/20. -Complete antibiotic course with Ciprofloxacin x1  ESRD on hemodialysis Nephrology consulted. Patient transitioned from peritoneal dialysis to hemodialysis. IR consulted for dialysis catheter placement which was performed on 12/18. Hemodialysis started on 12/18. PD catheter removed on 12/20. Tunneled HD catheter placed on 12/26.  Acute metabolic/toxic encephalopathy Possibly related to uremia vs Cefepime vs both. CT head unremarkable for acute process. Mental status significantly improved 12/20 after HD. Since mental status improved with HD while on Cefepime, Cefepime not discontinued. Resolved.  Leukocytosis Secondary to infection. Resolved.  Metabolic acidosis with increased anion gap Secondary to renal failure. Management with HD. Resolved.  Anemia of chronic kidney disease Hemoglobin dropped to 6.9 this morning. No obvious evidence of bleeding. 1 unit of PRBC  given with post-transfusion hemoglobin of 9.2. Drifted down slightly. -Hemoglobin/hematocrit in AM  Primary hypertension -Continue amlodipine, irbesartan, spironolactone  Lupus -Continue Plaquenil  GERD CT abdomen/pelvis significant for distal esophageal thickening which may be consistent with esophagitis. Recommendation for upper endoscopy. Patient appears to be asymptomatic. Outpatient follow-up.   DVT prophylaxis: Heparin subq Code Status:   Code Status: Full Code Family Communication: None at bedside. Disposition Plan: Discharge to SNF. Medically stable for discharge.   Consultants:  Nephrology General surgery  Procedures:  Hemodialysis  Antimicrobials: Vancomycin IV Cefepime IV   Subjective: No issues this morning. Currently receiving hemodialysis  Objective: BP 116/70 (BP Location: Left Arm)   Pulse 94   Temp 99.4 F (37.4 C) (Oral)   Resp 18   Ht '5\' 2"'$  (1.575 m)   Wt 51.5 kg   SpO2 98%   BMI 20.77 kg/m   Examination:  General exam: Appears calm and comfortable Respiratory system: Clear to auscultation. Respiratory effort normal. Cardiovascular system: S1 & S2 heard, RRR. Gastrointestinal system: Abdomen is nondistended, soft and nontender. Normal bowel sounds heard. Central nervous system: Alert and oriented. No focal neurological deficits. Psychiatry: Judgement and insight appear normal. Mood & affect appropriate.    Data Reviewed: I have personally reviewed following labs and imaging studies  CBC Lab Results  Component Value Date   WBC 8.8 10/28/2022   RBC 2.91 (L) 10/28/2022   HGB 7.6 (L) 10/28/2022   HCT 24.6 (L) 10/28/2022   MCV 84.5 10/28/2022   MCH 26.1 10/28/2022   PLT 268 10/28/2022   MCHC 30.9 10/28/2022   RDW 18.1 (H) 10/28/2022   LYMPHSABS 0.8 10/28/2022   MONOABS 0.8 10/28/2022   EOSABS 0.2 10/28/2022   BASOSABS 0.0 51/76/1607     Last metabolic panel Lab Results  Component Value Date   NA 134 (L) 10/28/2022   K 3.7  10/28/2022  CL 101 10/28/2022   CO2 22 10/28/2022   BUN 45 (H) 10/28/2022   CREATININE 8.76 (H) 10/28/2022   GLUCOSE 92 10/28/2022   GFRNONAA 5 (L) 10/28/2022   GFRAA 5 (L) 05/21/2020   CALCIUM 7.5 (L) 10/28/2022   PHOS 5.4 (H) 10/27/2022   PROT 6.8 10/16/2022   ALBUMIN <1.5 (L) 10/27/2022   LABGLOB 3.0 05/21/2020   AGRATIO 1.1 (L) 05/21/2020   BILITOT 2.3 (H) 10/16/2022   ALKPHOS 48 10/16/2022   AST 15 10/16/2022   ALT 10 10/16/2022   ANIONGAP 11 10/28/2022    GFR: Estimated Creatinine Clearance: 4.8 mL/min (A) (by C-G formula based on SCr of 8.76 mg/dL (H)).  Recent Results (from the past 240 hour(s))  Culture, blood (Routine X 2) w Reflex to ID Panel     Status: None   Collection Time: 10/22/22 11:13 PM   Specimen: BLOOD RIGHT HAND  Result Value Ref Range Status   Specimen Description BLOOD RIGHT HAND  Final   Special Requests   Final    BOTTLES DRAWN AEROBIC AND ANAEROBIC Blood Culture adequate volume   Culture   Final    NO GROWTH 5 DAYS Performed at [facility], [address], [place], [place] [address]    Report Status 10/28/2022 FINAL  Final  Culture, blood (Routine X 2) w Reflex to ID Panel     Status: None   Collection Time: 10/22/22 11:15 PM   Specimen: BLOOD RIGHT FOREARM  Result Value Ref Range Status   Specimen Description BLOOD RIGHT FOREARM  Final   Special Requests   Final    BOTTLES DRAWN AEROBIC AND ANAEROBIC Blood Culture adequate volume   Culture   Final    NO GROWTH 5 DAYS Performed at [facility], [place] [address], [place], [place] [address]    Report Status 10/28/2022 FINAL  Final  Aerobic/Anaerobic Culture w Gram Stain (surgical/deep wound)     Status: None   Collection Time: 10/23/22 10:15 AM   Specimen: PATH GI Other; Tissue  Result Value Ref Range Status   Specimen Description CATH TIP  Final   Special Requests NONE  Final   Gram Stain NO WBC SEEN NO ORGANISMS SEEN   Final   Culture   Final    No growth aerobically  or anaerobically. Performed at [facility], [place] [address] [place], [place] [address]    Report Status 10/28/2022 FINAL  Final      Radiology Studies: IR Fluoro Guide CV Line Right  Result Date: 10/30/2022 CLINICAL DATA:  Renal insufficiency, needs access for hemodialysis EXAM: TUNNELED HEMODIALYSIS CATHETER PLACEMENT WITH ULTRASOUND AND FLUOROSCOPIC GUIDANCE TECHNIQUE: The procedure, risks, benefits, and alternatives were explained to the patient. Questions regarding the procedure were encouraged and answered. The patient understands and consents to the procedure. patency of the right IJ vein was confirmed with ultrasound with image documentation. An appropriate skin site was determined. Region was prepped using maximum barrier technique including cap and mask, sterile gown, sterile gloves, large sterile sheet, and Chlorhexidine as cutaneous antisepsis. The region was infiltrated locally with 1% lidocaine. Intravenous Fentanyl 10mg and Versed '1mg'$  were administered as conscious sedation during continuous monitoring of the patient's level of consciousness and physiological / cardiorespiratory status by the radiology RN, with a total moderate sedation time of 14 minutes. Under real-time ultrasound guidance, the right IJ vein was accessed with a 21 gauge micropuncture needle; the needle tip within the vein was confirmed with ultrasound  image documentation. Needle exchanged over the 018 guidewire for transitional dilator, which allowed advancement of a Benson wire into the IVC. Over this, an MPA catheter was advanced. A Palindrome 19 hemodialysis catheter was tunneled from the right anterior chest wall approach to the right IJ dermatotomy site. The MPA catheter was exchanged over an Amplatz wire for serial vascular dilators which allow placement of a peel-away sheath, through which the catheter was advanced under intermittent fluoroscopy, positioned with its tips in the proximal and midright atrium.  Spot chest radiograph confirms good catheter position. No pneumothorax. Catheter was flushed and primed per protocol. Catheter secured externally with O Prolene sutures. The right IJ dermatotomy site was closed with Dermabond. COMPLICATIONS: COMPLICATIONS None immediate FLUOROSCOPY: Radiation Exposure Index (as provided by the fluoroscopic device): Less than 0.1 mGy air Kerma COMPARISON:  None Available. IMPRESSION: 1. Technically successful placement of tunneled right IJ hemodialysis catheter with ultrasound and fluoroscopic guidance. Ready for routine use. ACCESS: Remains approachable for percutaneous intervention as needed. Electronically Signed   By: Lucrezia Europe M.D.   On: 10/30/2022 09:19      LOS: 14 days    Cordelia Poche, MD [facility] 10/30/2022, 4:44 PM   If 7PM-7AM, please contact night-coverage [email or web address]

## 2022-10-31 ENCOUNTER — Encounter: Payer: Self-pay | Admitting: *Deleted

## 2022-10-31 ENCOUNTER — Telehealth: Payer: Self-pay | Admitting: *Deleted

## 2022-10-31 DIAGNOSIS — I10 Essential (primary) hypertension: Secondary | ICD-10-CM | POA: Diagnosis not present

## 2022-10-31 DIAGNOSIS — N186 End stage renal disease: Secondary | ICD-10-CM | POA: Diagnosis not present

## 2022-10-31 DIAGNOSIS — K650 Generalized (acute) peritonitis: Secondary | ICD-10-CM | POA: Diagnosis not present

## 2022-10-31 DIAGNOSIS — K2100 Gastro-esophageal reflux disease with esophagitis, without bleeding: Secondary | ICD-10-CM | POA: Diagnosis not present

## 2022-10-31 LAB — HEMOGLOBIN AND HEMATOCRIT, BLOOD
HCT: 22.4 % — ABNORMAL LOW (ref 36.0–46.0)
HCT: 29.4 % — ABNORMAL LOW (ref 36.0–46.0)
Hemoglobin: 6.9 g/dL — CL (ref 12.0–15.0)
Hemoglobin: 9.3 g/dL — ABNORMAL LOW (ref 12.0–15.0)

## 2022-10-31 LAB — PREPARE RBC (CROSSMATCH)

## 2022-10-31 MED ORDER — POLYETHYLENE GLYCOL 3350 17 G PO PACK
17.0000 g | PACK | Freq: Every day | ORAL | Status: DC
  Administered 2022-10-31 – 2022-11-02 (×3): 17 g via ORAL
  Filled 2022-10-31 (×3): qty 1

## 2022-10-31 MED ORDER — SODIUM CHLORIDE 0.9% IV SOLUTION: Freq: Once | INTRAVENOUS | Status: AC

## 2022-10-31 NOTE — Progress Notes (Signed)
[facility] Progress Note   Subjective:   patient seen and examined bedside. No acute events. Receiving 1u prbc currently for hgb 6.9  Objective Vitals:   10/31/22 1002 10/31/22 1100 10/31/22 1120 10/31/22 1123  BP: 122/69 116/72 121/70 121/70  Pulse: 91 95 91 91  Resp: '18 18 18 18  '$ Temp: 98.3 F (36.8 C) 99.4 F (37.4 C) 99.1 F (37.3 C) 99.1 F (37.3 C)  TempSrc: Oral Oral  Oral  SpO2: 97% 95%  100%  Weight:      Height:       Physical Exam General: Alert female in NAD Heart: RRR, no murmurs, rubs or gallops Lungs: CTA bilaterally Abdomen: PD catheter removed, +BS  Extremities: No edema b/l lower extremities Dialysis Access: tunneled R IJ catheter in place c/d/i  Additional Objective Labs: Basic Metabolic Panel: Recent Labs  Lab 10/27/22 0418 10/28/22 0754  NA 133* 134*  K 3.6 3.7  CL 97* 101  CO2 23 22  GLUCOSE 83 92  BUN 34* 45*  CREATININE 6.98* 8.76*  CALCIUM 7.4* 7.5*  PHOS 5.4*  --    Liver Function Tests: Recent Labs  Lab 10/27/22 0418  ALBUMIN <1.5*   No results for input(s): "LIPASE", "AMYLASE" in the last 168 hours. CBC: Recent Labs  Lab 10/27/22 0418 10/28/22 0754 10/31/22 0148  WBC 8.5 8.8  --   NEUTROABS  --  6.8  --   HGB 8.5* 7.6* 6.9*  HCT 28.8* 24.6* 22.4*  MCV 87.3 84.5  --   PLT 272 268  --    Blood Culture    Component Value Date/Time   SDES CATH TIP 10/23/2022 1015   SPECREQUEST NONE 10/23/2022 1015   CULT  10/23/2022 1015    No growth aerobically or anaerobically. Performed at [facility], [place] [address] [place], [place] [address]    REPTSTATUS 10/28/2022 FINAL 10/23/2022 1015    Cardiac Enzymes: No results for input(s): "CKTOTAL", "CKMB", "CKMBINDEX", "TROPONINI" in the last 168 hours. CBG: No results for input(s): "GLUCAP" in the last 168 hours. Iron Studies: No results for input(s): "IRON", "TIBC", "TRANSFERRIN", "FERRITIN" in the last 72 hours. '@lablastinr3'$ @ Studies/Results: No  results found. Medications:  sodium chloride 10 mL/hr at 10/31/22 5784    amLODipine  10 mg Oral Daily   calcium acetate  667 mg Oral TID WC   Chlorhexidine Gluconate Cloth  6 each Topical Daily   [START ON 11/02/2022] darbepoetin (ARANESP) injection - DIALYSIS  100 mcg Subcutaneous Q Sun-1800   feeding supplement (NEPRO CARB STEADY)  237 mL Oral BID BM   gentamicin cream  1 Application Topical Daily   heparin injection (subcutaneous)  5,000 Units Subcutaneous Q8H   hydroxychloroquine  200 mg Oral QODAY   irbesartan  300 mg Oral QPM   melatonin  3 mg Oral QHS   multivitamin  1 tablet Oral QHS   pantoprazole  40 mg Oral Daily   polyethylene glycol  17 g Oral Daily   propranolol  40 mg Oral QID   sodium chloride flush  3 mL Intravenous Q12H   spironolactone  25 mg Oral Daily    Dialysis Orders: Former PD -> transitioned to HD. [name].  Assessment/Plan: Polymicrobial PD Peritonitis; OP cultures showing  Klebsiella, Citrobacter, enterobacter R to cefazolin but otherwise pan-sensitive.  PD cath not functioning anyway so will convert to hemo.  D/w pt- disappointed but in agreement.  PD catheter was removed 12/21 Repeat Bcx ordered 12/20 - remain neg.  On cefepime through 12/28 - will get last dose here today.   PD cath malfunction: removed, converted to HD as above ESRD: Formerly PD, converted to HD.  [place] conversion 12/26. Outpt CLIP -davita [name] 10:15 chair time arranged. Will keep on TTS schedule, HD tomorrow HTN: BP controlled, euvolemic on exam on CCB, MRB, ARB as outpt Hx/o SLE: per primary team Anemia: Hgb drifting down again to 6.9 today. No obvious bleeding.  Receiving prn Transfusions <7.  Also on ESA.  [place] for d/c from my perspective.  She says PT recommended SNF.     Gean Quint, MD [facility]

## 2022-10-31 NOTE — Progress Notes (Signed)
PROGRESS NOTE    Sheena Smith  WUJ:[id number] DOB: August 07, 1951 DOA: 10/15/2022 PCP: [name], MD   Brief Narrative: Sheena Smith is a 71 y.o. female with a history of hypertension, ESRD on PD and lupus. Patient presented secondary to abdominal pain and was found to have evidence of acute peritonitis related to peritoneal dialysis. Patient started on empiric Vancomycin/Cefepime and transitioned to hemodialysis. Patient was transitioned to Cefepime IV monotherapy for treatment of Klebsiella pneumoniae infection, however outpatient cultures also grew citrobacter werkmanii and enterobacter cloacae. PD catheter removed on 12/20. Tunneled HD catheter placed on 12/26. Patient stable for discharge to SNF once bed is available.   Assessment and Plan:  Klebsiella pneumoniae peritonitis In setting of peritoneal dialysis history. Patient started on empiric Vancomycin/Cefepime. Body fluid culture significant for ampicillin resistant klebsiella pneumoniae infection. Blood cultures negative. PD catheter removed on 12/20. Patient transitioned to Cefepime monotherapy and finally Ciprofloxacin. Patient completed antibiotic course.  ESRD on hemodialysis Nephrology consulted. Patient transitioned from peritoneal dialysis to hemodialysis. IR consulted for dialysis catheter placement which was performed on 12/18. Hemodialysis started on 12/18. PD catheter removed on 12/20. Tunneled HD catheter placed on 12/26.  Acute metabolic/toxic encephalopathy Possibly related to uremia vs Cefepime vs both. CT head unremarkable for acute process. Mental status significantly improved 12/20 after HD. Since mental status improved with HD while on Cefepime, Cefepime not discontinued. Resolved.  Leukocytosis Secondary to infection. Resolved.  Metabolic acidosis with increased anion gap Secondary to renal failure. Management with HD. Resolved.  Anemia of chronic kidney disease Hemoglobin dropped to 6.9 this  morning. No obvious evidence of bleeding. 1 unit of PRBC given with post-transfusion hemoglobin of 9.2. Drifted down again to 6.9 on 12/29; 1 unit of PRBC ordered. -Post-transfusion H&H and CBC in AM  Primary hypertension -Continue amlodipine, irbesartan, spironolactone  Lupus -Continue Plaquenil  GERD CT abdomen/pelvis significant for distal esophageal thickening which may be consistent with esophagitis. Recommendation for upper endoscopy. Patient appears to be asymptomatic. Outpatient follow-up.   DVT prophylaxis: Heparin subq Code Status:   Code Status: Full Code Family Communication: None at bedside. Disposition Plan: Discharge to SNF. Medically stable for discharge once hemoglobin stable.   Consultants:  Nephrology General surgery  Procedures:  Hemodialysis  Antimicrobials: Vancomycin IV Cefepime IV   Subjective: Some mild abdominal pain. Patient feels like she needs to have a bowel movement to feel better.  Objective: BP 125/79 (BP Location: Right Arm)   Pulse 92   Temp 99.8 F (37.7 C) (Oral)   Resp 18   Ht '5\' 2"'$  (1.575 m)   Wt 48.2 kg   SpO2 95%   BMI 19.44 kg/m   Examination:  General exam: Appears calm and comfortable Respiratory system: Clear to auscultation. Respiratory effort normal. Cardiovascular system: S1 & S2 heard, RRR. No murmurs, rubs, gallops or clicks. Gastrointestinal system: Abdomen is nondistended, soft and mildly tender. Normal bowel sounds heard. Central nervous system: Alert and oriented. No focal neurological deficits. Musculoskeletal: No edema. No calf tenderness Skin: No cyanosis. No rashes Psychiatry: Judgement and insight appear normal. Mood & affect appropriate.    Data Reviewed: I have personally reviewed following labs and imaging studies  CBC Lab Results  Component Value Date   WBC 8.8 10/28/2022   RBC 2.91 (L) 10/28/2022   HGB 6.9 (LL) 10/31/2022   HCT 22.4 (L) 10/31/2022   MCV 84.5 10/28/2022   MCH 26.1  10/28/2022   PLT 268 10/28/2022   MCHC 30.9 10/28/2022  RDW 18.1 (H) 10/28/2022   LYMPHSABS 0.8 10/28/2022   MONOABS 0.8 10/28/2022   EOSABS 0.2 10/28/2022   BASOSABS 0.0 37/54/3606     Last metabolic panel Lab Results  Component Value Date   NA 134 (L) 10/28/2022   K 3.7 10/28/2022   CL 101 10/28/2022   CO2 22 10/28/2022   BUN 45 (H) 10/28/2022   CREATININE 8.76 (H) 10/28/2022   GLUCOSE 92 10/28/2022   GFRNONAA 5 (L) 10/28/2022   GFRAA 5 (L) 05/21/2020   CALCIUM 7.5 (L) 10/28/2022   PHOS 5.4 (H) 10/27/2022   PROT 6.8 10/16/2022   ALBUMIN <1.5 (L) 10/27/2022   LABGLOB 3.0 05/21/2020   AGRATIO 1.1 (L) 05/21/2020   BILITOT 2.3 (H) 10/16/2022   ALKPHOS 48 10/16/2022   AST 15 10/16/2022   ALT 10 10/16/2022   ANIONGAP 11 10/28/2022    GFR: Estimated Creatinine Clearance: 4.6 mL/min (A) (by C-G formula based on SCr of 8.76 mg/dL (H)).  Recent Results (from the past 240 hour(s))  Culture, blood (Routine X 2) w Reflex to ID Panel     Status: None   Collection Time: 10/22/22 11:13 PM   Specimen: BLOOD RIGHT HAND  Result Value Ref Range Status   Specimen Description BLOOD RIGHT HAND  Final   Special Requests   Final    BOTTLES DRAWN AEROBIC AND ANAEROBIC Blood Culture adequate volume   Culture   Final    NO GROWTH 5 DAYS Performed at [facility], [address] [place], [place] [address]    Report Status 10/28/2022 FINAL  Final  Culture, blood (Routine X 2) w Reflex to ID Panel     Status: None   Collection Time: 10/22/22 11:15 PM   Specimen: BLOOD RIGHT FOREARM  Result Value Ref Range Status   Specimen Description BLOOD RIGHT FOREARM  Final   Special Requests   Final    BOTTLES DRAWN AEROBIC AND ANAEROBIC Blood Culture adequate volume   Culture   Final    NO GROWTH 5 DAYS Performed at [facility], [place] [address], [place], [place] [address]    Report Status 10/28/2022 FINAL  Final  Aerobic/Anaerobic Culture w Gram Stain (surgical/deep wound)      Status: None   Collection Time: 10/23/22 10:15 AM   Specimen: PATH GI Other; Tissue  Result Value Ref Range Status   Specimen Description CATH TIP  Final   Special Requests NONE  Final   Gram Stain NO WBC SEEN NO ORGANISMS SEEN   Final   Culture   Final    No growth aerobically or anaerobically. Performed at [facility], [place] [address], [place], [place] [address]    Report Status 10/28/2022 FINAL  Final      Radiology Studies: No results found.    LOS: 15 days    Cordelia Poche, MD [facility] 10/31/2022, 5:09 PM   If 7PM-7AM, please contact night-coverage [email or web address]

## 2022-10-31 NOTE — Progress Notes (Signed)
Contacted [facility] to provide update that pt is currently still hospitalized and snf placement is in the works. Will need to coordinate pt's start date at clinic once d/c plan has been confirmed. Pt is being considered for a TTS 10:15 chair time at clinic at d/c. Will assist as needed.   Melven Sartorius Renal Navigator [phone]

## 2022-10-31 NOTE — Patient Outreach (Signed)
  Care Coordination   Initial Visit Note   10/31/2022 Name: Thereasa Iannello MRN: [id number] DOB: September 30, 1951  Vitalia Stough is a 71 y.o. year old female who sees [name], [name], MD for primary care. I spoke with  [name] by phone today.  What matters to the patients health and wellness today?  No needs    Goals Addressed             This Visit's Progress    COMPLETED: care coordination activitiy       Care Coordination Interventions: Assessed social determinant of health barriers Educated pt on care management services involving social workers, pharmacy and a Designer, jewellery. Pt states she is currently inpt for infection and will be going to a SNF for ongoing rehabilitation post-op discharge. States she has a supportive family for ongoing care once she returns home. No needs presented at this time.         SDOH assessments and interventions completed:  Yes  SDOH Interventions Today    Flowsheet Row Most Recent Value  SDOH Interventions   Food Insecurity Interventions Intervention Not Indicated  Housing Interventions Intervention Not Indicated  Transportation Interventions Intervention Not Indicated  Utilities Interventions Intervention Not Indicated        Care Coordination Interventions:  Yes, provided   Follow up plan: No further intervention required.   Encounter Outcome:  Pt. Visit Completed   Raina Mina, RN Care Management Coordinator [place] Office [phone]

## 2022-10-31 NOTE — Progress Notes (Signed)
Physical Therapy Treatment Patient Details Name: Sheena Smith MRN: [id number] DOB: [birth year] Today's Date: 10/31/2022   History of Present Illness 71 yo female with onset of abd pain, fever and swelling was found to have sepsis from PD cath site, metabolic acidosis, anemia and elevated anion gap.  Had a missed HD session as well, admitted for follow up to intra-abdominal ABT.  PMHx:  ESRD, HTN, lupus, renal failure,    PT Comments    Tolerated physical therapy session well although easily fatigued. Eager to mobilize with therapy. Focused on bed mobility, transfer, with min assist and gait training at min guard level. Ambulated two short bouts in room but needed a rest break between sets. Completed LE exercises with cues for technique. Patient will continue to benefit from skilled physical therapy services to further improve independence with functional mobility.   Recommendations for follow up therapy are one component of a multi-disciplinary discharge planning process, led by the attending physician.  Recommendations may be updated based on patient status, additional functional criteria and insurance authorization.  Follow Up Recommendations  Skilled nursing-short term rehab (<3 hours/day) Can patient physically be transported by private vehicle: Yes   Assistance Recommended at Discharge Intermittent Supervision/Assistance  Patient can return home with the following A little help with walking and/or transfers;A little help with bathing/dressing/bathroom;Assistance with cooking/housework;Assist for transportation;Help with stairs or ramp for entrance   Equipment Recommendations  None recommended by PT    Recommendations for Other Services       Precautions / Restrictions Precautions Precautions: Fall Restrictions Weight Bearing Restrictions: No     Mobility  Bed Mobility Overal bed mobility: Needs Assistance Bed Mobility: Supine to Sit     Supine to sit: Min assist      General bed mobility comments: Min assist for trunk and LEs to EOB, slow effortful.    Transfers Overall transfer level: Needs assistance Equipment used: Rolling walker (2 wheels) Transfers: Sit to/from Stand Sit to Stand: Min assist           General transfer comment: Min assist from bed and recliner first couple of times. Cues for hand placement and technique. Progressed to Min guard from recliner.    Ambulation/Gait Ambulation/Gait assistance: Min guard Gait Distance (Feet): 18 Feet (x2) Assistive device: Rolling walker (2 wheels) Gait Pattern/deviations: Step-through pattern, Step-to pattern, Decreased stride length, Wide base of support, Trunk flexed Gait velocity: reduced Gait velocity interpretation: <1.31 ft/sec, indicative of household ambulator   General Gait Details: Close guard for safety. Cues for RW use, upright posture, and symptom awareness. Pt reports LE weakness but no buckling noted. Required seated rest break, but able to complete second bout after rest throughout room.   Stairs             Wheelchair Mobility    Modified Rankin (Stroke Patients Only)       Balance Overall balance assessment: Needs assistance Sitting-balance support: Feet supported Sitting balance-Leahy Scale: Fair     Standing balance support: Bilateral upper extremity supported, During functional activity Standing balance-Leahy Scale: Poor Standing balance comment: Unsteady, braces legs on chair.                            Cognition Arousal/Alertness: Awake/alert Behavior During Therapy: WFL for tasks assessed/performed Overall Cognitive Status: Within Functional Limits for tasks assessed  Exercises General Exercises - Lower Extremity Ankle Circles/Pumps: AROM, Both, 10 reps, Seated Quad Sets: Strengthening, Both, 10 reps, Supine Gluteal Sets: Strengthening, Both, 10 reps, Seated Long Arc Quad:  Strengthening, Both, 10 reps, Seated Hip ABduction/ADduction: Strengthening, Both, 10 reps, Seated (pillow squeeze) Hip Flexion/Marching: Strengthening, Both, 10 reps, Seated    General Comments        Pertinent Vitals/Pain Pain Assessment Pain Assessment: No/denies pain    Home Living                          Prior Function            PT Goals (current goals can now be found in the care plan section) Acute Rehab PT Goals Patient Stated Goal: to get stronger and get home PT Goal Formulation: With patient Time For Goal Achievement: 11/12/22 Potential to Achieve Goals: Good Progress towards PT goals: Progressing toward goals    Frequency    Min 2X/week      PT Plan Current plan remains appropriate    Co-evaluation              AM-PAC PT "6 Clicks" Mobility   Outcome Measure  Help needed turning from your back to your side while in a flat bed without using bedrails?: A Little Help needed moving from lying on your back to sitting on the side of a flat bed without using bedrails?: A Little Help needed moving to and from a bed to a chair (including a wheelchair)?: A Little Help needed standing up from a chair using your arms (e.g., wheelchair or bedside chair)?: A Little Help needed to walk in hospital room?: A Little Help needed climbing 3-5 steps with a railing? : A Little 6 Click Score: 18    End of Session Equipment Utilized During Treatment: Gait belt Activity Tolerance: Patient tolerated treatment well;Patient limited by fatigue Patient left: with call bell/phone within reach;in chair;with chair alarm set Nurse Communication: Mobility status PT Visit Diagnosis: Unsteadiness on feet (R26.81);Muscle weakness (generalized) (M62.81);Difficulty in walking, not elsewhere classified (R26.2)     Time: 3662-9476 PT Time Calculation (min) (ACUTE ONLY): 21 min  Charges:  $Gait Training: 8-22 mins                     Candie Mile, PT,  DPT Physical Therapist [facility] [place] 10/31/2022, 2:38 PM

## 2022-10-31 NOTE — Patient Instructions (Signed)
Visit Information  Thank you for taking time to visit with me today. Please don't hesitate to contact me if I can be of assistance to you.   Following are the goals we discussed today:   Goals Addressed             This Visit's Progress    COMPLETED: care coordination activitiy       Care Coordination Interventions: Assessed social determinant of health barriers Educated pt on care management services involving social workers, pharmacy and a Designer, jewellery. Pt states she is currently inpt for infection and will be going to a SNF for ongoing rehabilitation post-op discharge. States she has a supportive family for ongoing care once she returns home. No needs presented at this time.         Please call the care guide team at [phone] if you need to cancel or reschedule your appointment.   If you are experiencing a Mental Health or [place] or need someone to talk to, please call the Suicide and Crisis Lifeline: 988  Patient verbalizes understanding of instructions and care plan provided today and agrees to view in [place]. Active MyChart status and patient understanding of how to access instructions and care plan via MyChart confirmed with patient.     No further follow up required: No follow up needs at this time.    Raina Mina, RN Care Management Coordinator [place] Office [phone]

## 2022-10-31 NOTE — Progress Notes (Addendum)
Spoke to pt re current SNF offers [facility], [facility], [facility]) and she has deferred dc planning decisions to her dtr Camisha. Call placed to [facility], voicemail left requesting return call. Pt's preferred SNF [facility] not currently accepting HD patients. SW will provide updates as available.   Wandra Feinstein, MSW, LCSW [phone] (coverage)

## 2022-11-01 DIAGNOSIS — K2100 Gastro-esophageal reflux disease with esophagitis, without bleeding: Secondary | ICD-10-CM | POA: Diagnosis not present

## 2022-11-01 DIAGNOSIS — K650 Generalized (acute) peritonitis: Secondary | ICD-10-CM | POA: Diagnosis not present

## 2022-11-01 DIAGNOSIS — N186 End stage renal disease: Secondary | ICD-10-CM | POA: Diagnosis not present

## 2022-11-01 DIAGNOSIS — I10 Essential (primary) hypertension: Secondary | ICD-10-CM | POA: Diagnosis not present

## 2022-11-01 LAB — RENAL FUNCTION PANEL
Albumin: <1.5 g/dL — ABNORMAL LOW (ref 3.5–5.0)
Anion gap: 11 (ref 5–15)
BUN: 30 mg/dL — ABNORMAL HIGH (ref 8–23)
CO2: 26 mmol/L (ref 22–32)
Calcium: 7.5 mg/dL — ABNORMAL LOW (ref 8.9–10.3)
Chloride: 97 mmol/L — ABNORMAL LOW (ref 98–111)
Creatinine, Ser: 7.35 mg/dL — ABNORMAL HIGH (ref 0.44–1.00)
GFR, Estimated: 6 mL/min — ABNORMAL LOW (ref >60)
Glucose, Bld: 115 mg/dL — ABNORMAL HIGH (ref 70–99)
Phosphorus: 3.5 mg/dL (ref 2.5–4.6)
Potassium: 3.5 mmol/L (ref 3.5–5.1)
Sodium: 134 mmol/L — ABNORMAL LOW (ref 135–145)

## 2022-11-01 LAB — BPAM RBC
Blood Product Expiration Date: 202401182359
ISSUE DATE / TIME: 202312291054
Unit Type and Rh: 6200

## 2022-11-01 LAB — TYPE AND SCREEN
ABO/RH(D): A POS
Antibody Screen: NEGATIVE
Unit division: 0

## 2022-11-01 LAB — CBC
HCT: 29.3 % — ABNORMAL LOW (ref 36.0–46.0)
Hemoglobin: 8.8 g/dL — ABNORMAL LOW (ref 12.0–15.0)
MCH: 25.6 pg — ABNORMAL LOW (ref 26.0–34.0)
MCHC: 30 g/dL (ref 30.0–36.0)
MCV: 85.2 fL (ref 80.0–100.0)
Platelets: 311 10*3/uL (ref 150–400)
RBC: 3.44 MIL/uL — ABNORMAL LOW (ref 3.87–5.11)
RDW: 17.3 % — ABNORMAL HIGH (ref 11.5–15.5)
WBC: 9.6 10*3/uL (ref 4.0–10.5)
nRBC: 0.3 % — ABNORMAL HIGH (ref 0.0–0.2)

## 2022-11-01 MED ORDER — HEPARIN SODIUM (PORCINE) 1000 UNIT/ML IJ SOLN
INTRAMUSCULAR | Status: AC
  Administered 2022-11-01: 3200 [IU]
  Filled 2022-11-01: qty 4

## 2022-11-01 MED ORDER — POLYETHYLENE GLYCOL 3350 17 G PO PACK: 17.0000 g | PACK | Freq: Every day | ORAL | Status: DC | PRN

## 2022-11-01 MED ORDER — ORAL CARE MOUTH RINSE: 15.0000 mL | OROMUCOSAL | Status: DC | PRN

## 2022-11-01 MED ORDER — SENNOSIDES-DOCUSATE SODIUM 8.6-50 MG PO TABS
2.0000 | ORAL_TABLET | Freq: Every day | ORAL | Status: DC
  Administered 2022-11-01 – 2022-11-04 (×3): 2 via ORAL
  Filled 2022-11-01 (×4): qty 2

## 2022-11-01 MED ORDER — CHLORHEXIDINE GLUCONATE CLOTH 2 % EX PADS
6.0000 | MEDICATED_PAD | Freq: Every day | CUTANEOUS | Status: DC
  Administered 2022-11-02 – 2022-11-05 (×4): 6 via TOPICAL

## 2022-11-01 NOTE — Progress Notes (Signed)
Received patient in bed to unit.  Alert and oriented.  Informed consent signed and in chart.   Treatment initiated: 08:25 Treatment completed: 11:35  Patient tolerated well.  Transported back to the room  Alert, without acute distress.  Hand-off given to patient's nurse.   Access used: R hemodiayis catheter Access issues: none  Total UF removed: 2L Medication(s) given: none     11/01/22 1130  Vitals  Temp 98.7 F (37.1 C)  Temp Source Oral  BP 119/75  MAP (mmHg) 87  BP Location Right Arm  BP Method Automatic  Patient Position (if appropriate) Lying  Pulse Rate 94  Pulse Rate Source Monitor  ECG Heart Rate 94  Resp (!) 22  Oxygen Therapy  SpO2 96 %  O2 Device Room Air  During Treatment Monitoring  Blood Flow Rate (mL/min) 200 mL/min  HD Safety Checks Performed Yes  Intra-Hemodialysis Comments Tx completed;Tolerated well  Post Treatment  Dialyzer Clearance Lightly streaked  Duration of HD Treatment -hour(s) 3 hour(s)  Hemodialysis Intake (mL) 0 mL  Liters Processed 54  Fluid Removed (mL) 2000 mL  Tolerated HD Treatment Yes  Post-Hemodialysis Comments Pt ran 3 hr tx, Uf goal met of 2L, Pt catheter ran with no complications      Carl Butner [facility]

## 2022-11-01 NOTE — Progress Notes (Signed)
   10/31/22 2351  Provider Notification  Provider Name/Title Dr. Irene Pap  Date Provider Notified 10/31/22  Time Provider Notified 2351  Method of Notification Page  Notification Reason Requested by patient/family (Patient wants Colace ordered tonight)  Provider response See new orders  Date of Provider Response 11/01/22   Patient feel like she needs Colace because she needs to have a bowel movement but is unable to push the stool out of her rectum.  She took Miralax earlier in the day.  She wants an order for Colace tonight.  Dr. Nevada Crane made aware and order for Senokot received and given to the patient.  The patient successfully had a watery bowel movement approximately one hour after the Senokot was administered.  Earleen Reaper RN

## 2022-11-01 NOTE — Progress Notes (Signed)
PROGRESS NOTE    Sheena Smith  EZM:[id number] DOB: 1951-06-26 DOA: 10/15/2022 PCP: [name], MD   Brief Narrative: Sheena Smith is a 71 y.o. female with a history of hypertension, ESRD on PD and lupus. Patient presented secondary to abdominal pain and was found to have evidence of acute peritonitis related to peritoneal dialysis. Patient started on empiric Vancomycin/Cefepime and transitioned to hemodialysis. Patient was transitioned to Cefepime IV monotherapy for treatment of Klebsiella pneumoniae infection, however outpatient cultures also grew citrobacter werkmanii and enterobacter cloacae. PD catheter removed on 12/20. Tunneled HD catheter placed on 12/26. Patient stable for discharge to SNF once bed is available.   Assessment and Plan:  Klebsiella pneumoniae peritonitis In setting of peritoneal dialysis history. Patient started on empiric Vancomycin/Cefepime. Body fluid culture significant for ampicillin resistant klebsiella pneumoniae infection. Blood cultures negative. PD catheter removed on 12/20. Patient transitioned to Cefepime monotherapy and finally Ciprofloxacin. Patient completed antibiotic course.  ESRD on hemodialysis Nephrology consulted. Patient transitioned from peritoneal dialysis to hemodialysis. IR consulted for dialysis catheter placement which was performed on 12/18. Hemodialysis started on 12/18. PD catheter removed on 12/20. Tunneled HD catheter placed on 12/26.  Acute metabolic/toxic encephalopathy Possibly related to uremia vs Cefepime vs both. CT head unremarkable for acute process. Mental status significantly improved 12/20 after HD. Since mental status improved with HD while on Cefepime, Cefepime not discontinued. Resolved.  Leukocytosis Secondary to infection. Resolved.  Metabolic acidosis with increased anion gap Secondary to renal failure. Management with HD. Resolved.  Anemia of chronic kidney disease Hemoglobin dropped to 6.9 this  morning. No obvious evidence of bleeding. 1 unit of PRBC given with post-transfusion hemoglobin of 9.2. Drifted down again to 6.9 on 12/29; 1 unit of PRBC ordered with post transfusion hemoglobin significantly elevated at 9.3. Hemoglobin down to 8.8 today. -Hemoglobin and hematocrit tomorrow  Primary hypertension -Continue amlodipine, irbesartan, spironolactone  Lupus -Continue Plaquenil  GERD CT abdomen/pelvis significant for distal esophageal thickening which may be consistent with esophagitis. Recommendation for upper endoscopy. Patient appears to be asymptomatic. Outpatient follow-up.   DVT prophylaxis: Heparin subq Code Status:   Code Status: Full Code Family Communication: None at bedside. Disposition Plan: Discharge to SNF. Medically stable for discharge once hemoglobin stable.   Consultants:  Nephrology General surgery  Procedures:  Hemodialysis  Antimicrobials: Vancomycin IV Cefepime IV   Subjective: Abdominal pain improved today. Feels like she is getting stronger.  Objective: BP 119/75 (BP Location: Right Arm)   Pulse 94   Temp 98.7 F (37.1 C) (Oral)   Resp (!) 22   Ht '5\' 2"'$  (1.575 m)   Wt 47.6 kg   SpO2 96%   BMI 19.19 kg/m   Examination:  General exam: Appears calm and comfortable Respiratory system: Respiratory effort normal. Central nervous system: Alert and oriented. Psychiatry: Judgement and insight appear normal. Mood & affect appropriate.    Data Reviewed: I have personally reviewed following labs and imaging studies  CBC Lab Results  Component Value Date   WBC 9.6 11/01/2022   RBC 3.44 (L) 11/01/2022   HGB 8.8 (L) 11/01/2022   HCT 29.3 (L) 11/01/2022   MCV 85.2 11/01/2022   MCH 25.6 (L) 11/01/2022   PLT 311 11/01/2022   MCHC 30.0 11/01/2022   RDW 17.3 (H) 11/01/2022   LYMPHSABS 0.8 10/28/2022   MONOABS 0.8 10/28/2022   EOSABS 0.2 10/28/2022   BASOSABS 0.0 50/35/4656     Last metabolic panel Lab Results  Component Value  Date  NA 134 (L) 11/01/2022   K 3.5 11/01/2022   CL 97 (L) 11/01/2022   CO2 26 11/01/2022   BUN 30 (H) 11/01/2022   CREATININE 7.35 (H) 11/01/2022   GLUCOSE 115 (H) 11/01/2022   GFRNONAA 6 (L) 11/01/2022   GFRAA 5 (L) 05/21/2020   CALCIUM 7.5 (L) 11/01/2022   PHOS 3.5 11/01/2022   PROT 6.8 10/16/2022   ALBUMIN <1.5 (L) 11/01/2022   LABGLOB 3.0 05/21/2020   AGRATIO 1.1 (L) 05/21/2020   BILITOT 2.3 (H) 10/16/2022   ALKPHOS 48 10/16/2022   AST 15 10/16/2022   ALT 10 10/16/2022   ANIONGAP 11 11/01/2022    GFR: Estimated Creatinine Clearance: 5.4 mL/min (A) (by C-G formula based on SCr of 7.35 mg/dL (H)).  Recent Results (from the past 240 hour(s))  Culture, blood (Routine X 2) w Reflex to ID Panel     Status: None   Collection Time: 10/22/22 11:13 PM   Specimen: BLOOD RIGHT HAND  Result Value Ref Range Status   Specimen Description BLOOD RIGHT HAND  Final   Special Requests   Final    BOTTLES DRAWN AEROBIC AND ANAEROBIC Blood Culture adequate volume   Culture   Final    NO GROWTH 5 DAYS Performed at [facility], [address] [place], [place] [address]    Report Status 10/28/2022 FINAL  Final  Culture, blood (Routine X 2) w Reflex to ID Panel     Status: None   Collection Time: 10/22/22 11:15 PM   Specimen: BLOOD RIGHT FOREARM  Result Value Ref Range Status   Specimen Description BLOOD RIGHT FOREARM  Final   Special Requests   Final    BOTTLES DRAWN AEROBIC AND ANAEROBIC Blood Culture adequate volume   Culture   Final    NO GROWTH 5 DAYS Performed at [facility], [place] [address], [place], [place] [address]    Report Status 10/28/2022 FINAL  Final  Aerobic/Anaerobic Culture w Gram Stain (surgical/deep wound)     Status: None   Collection Time: 10/23/22 10:15 AM   Specimen: PATH GI Other; Tissue  Result Value Ref Range Status   Specimen Description CATH TIP  Final   Special Requests NONE  Final   Gram Stain NO WBC SEEN NO ORGANISMS SEEN   Final    Culture   Final    No growth aerobically or anaerobically. Performed at [facility], [place] [address] [place], [place] [address]    Report Status 10/28/2022 FINAL  Final      Radiology Studies: No results found.    LOS: 16 days    Cordelia Poche, MD [facility] 11/01/2022, 11:45 AM   If 7PM-7AM, please contact night-coverage [email or web address]

## 2022-11-01 NOTE — Progress Notes (Signed)
[facility] Progress Note   Subjective:   patient seen and examined bedside. Did okay with HD earlier this AM however she is requesting to not do HD in the morning. Needed to have a bowel movement in the morning but could not go because of dialysis which led her abdomen to be uncomfortable. No other complaints. Net uf 2L today  Objective Vitals:   11/01/22 1030 11/01/22 1100 11/01/22 1130 11/01/22 1146  BP: 128/77 121/75 119/75   Pulse: 88 91 94   Resp: (!) 22 (!) 23 (!) 22   Temp:   98.7 F (37.1 C)   TempSrc:   Oral   SpO2: 97% 98% 96%   Weight:    46.2 kg  Height:       Physical Exam General: Alert female in NAD Heart: RRR, no murmurs, rubs or gallops Lungs: CTA bilaterally Abdomen: PD catheter removed, +BS  Extremities: No edema b/l lower extremities Dialysis Access: tunneled R IJ catheter in place c/d/i  Additional Objective Labs: Basic Metabolic Panel: Recent Labs  Lab 10/27/22 0418 10/28/22 0754 11/01/22 0828  NA 133* 134* 134*  K 3.6 3.7 3.5  CL 97* 101 97*  CO2 '23 22 26  '$ GLUCOSE 83 92 115*  BUN 34* 45* 30*  CREATININE 6.98* 8.76* 7.35*  CALCIUM 7.4* 7.5* 7.5*  PHOS 5.4*  --  3.5   Liver Function Tests: Recent Labs  Lab 10/27/22 0418 11/01/22 0828  ALBUMIN <1.5* <1.5*   No results for input(s): "LIPASE", "AMYLASE" in the last 168 hours. CBC: Recent Labs  Lab 10/27/22 0418 10/28/22 0754 10/31/22 0148 10/31/22 1800 11/01/22 0429  WBC 8.5 8.8  --   --  9.6  NEUTROABS  --  6.8  --   --   --   HGB 8.5* 7.6* 6.9* 9.3* 8.8*  HCT 28.8* 24.6* 22.4* 29.4* 29.3*  MCV 87.3 84.5  --   --  85.2  PLT 272 268  --   --  311   Blood Culture    Component Value Date/Time   SDES CATH TIP 10/23/2022 1015   SPECREQUEST NONE 10/23/2022 1015   CULT  10/23/2022 1015    No growth aerobically or anaerobically. Performed at [facility], [place] [address] [place], [place] [address]    REPTSTATUS 10/28/2022 FINAL 10/23/2022 1015     Cardiac Enzymes: No results for input(s): "CKTOTAL", "CKMB", "CKMBINDEX", "TROPONINI" in the last 168 hours. CBG: No results for input(s): "GLUCAP" in the last 168 hours. Iron Studies: No results for input(s): "IRON", "TIBC", "TRANSFERRIN", "FERRITIN" in the last 72 hours. '@lablastinr3'$ @ Studies/Results: No results found. Medications:  sodium chloride 10 mL/hr at 10/31/22 5427    amLODipine  10 mg Oral Daily   calcium acetate  667 mg Oral TID WC   Chlorhexidine Gluconate Cloth  6 each Topical Daily   [START ON 11/02/2022] darbepoetin (ARANESP) injection - DIALYSIS  100 mcg Subcutaneous Q Sun-1800   feeding supplement (NEPRO CARB STEADY)  237 mL Oral BID BM   gentamicin cream  1 Application Topical Daily   heparin injection (subcutaneous)  5,000 Units Subcutaneous Q8H   hydroxychloroquine  200 mg Oral QODAY   irbesartan  300 mg Oral QPM   melatonin  3 mg Oral QHS   multivitamin  1 tablet Oral QHS   pantoprazole  40 mg Oral Daily   polyethylene glycol  17 g Oral Daily   propranolol  40 mg Oral QID   senna-docusate  2 tablet Oral  QHS   sodium chloride flush  3 mL Intravenous Q12H   spironolactone  25 mg Oral Daily    Dialysis Orders: Former PD -> transitioned to HD. [name].  Assessment/Plan: Polymicrobial PD Peritonitis; OP cultures showing  Klebsiella, Citrobacter, enterobacter R to cefazolin but otherwise pan-sensitive.  PD cath not functioning anyway so will convert to hemo.  D/w pt- disappointed but in agreement.  PD catheter was removed 12/21 Repeat Bcx ordered 12/20 - remain neg. cefepime through 12/28 PD cath malfunction: removed, converted to HD as above ESRD: Formerly PD, converted to HD.  [place] conversion 12/26. Outpt CLIP -davita [name] 10:15 chair time arranged. Will keep on TTS schedule, HD Tues. Will plan for HD 2nd shift if possible as per patient request HTN: BP controlled, euvolemic on exam on CCB, MRB, ARB as outpt Hx/o SLE: per primary team Anemia:  Hgb 8.8, s/p 1u prbc yesterday. No obvious bleeding.  Receiving prn Transfusions <7.  Also on ESA.  [place] for d/c from my perspective.  Pending d/c to SNF   Gean Quint, MD [facility]

## 2022-11-02 DIAGNOSIS — N186 End stage renal disease: Secondary | ICD-10-CM | POA: Diagnosis not present

## 2022-11-02 DIAGNOSIS — K2100 Gastro-esophageal reflux disease with esophagitis, without bleeding: Secondary | ICD-10-CM | POA: Diagnosis not present

## 2022-11-02 DIAGNOSIS — I10 Essential (primary) hypertension: Secondary | ICD-10-CM | POA: Diagnosis not present

## 2022-11-02 DIAGNOSIS — K650 Generalized (acute) peritonitis: Secondary | ICD-10-CM | POA: Diagnosis not present

## 2022-11-02 LAB — HEMOGLOBIN AND HEMATOCRIT, BLOOD
HCT: 28.9 % — ABNORMAL LOW (ref 36.0–46.0)
Hemoglobin: 9 g/dL — ABNORMAL LOW (ref 12.0–15.0)

## 2022-11-02 MED ORDER — DOCUSATE SODIUM 100 MG PO CAPS
100.0000 mg | ORAL_CAPSULE | Freq: Two times a day (BID) | ORAL | Status: DC
  Administered 2022-11-02 – 2022-11-06 (×5): 100 mg via ORAL
  Filled 2022-11-02 (×7): qty 1

## 2022-11-02 NOTE — Progress Notes (Signed)
PROGRESS NOTE    Sheena Smith  GNF:[id number] DOB: [birth year] DOA: 10/15/2022 PCP: [name], MD   Brief Narrative: Sheena Smith is a 71 y.o. female with a history of hypertension, ESRD on PD and lupus. Patient presented secondary to abdominal pain and was found to have evidence of acute peritonitis related to peritoneal dialysis. Patient started on empiric Vancomycin/Cefepime and transitioned to hemodialysis. Patient was transitioned to Cefepime IV monotherapy for treatment of Klebsiella pneumoniae infection, however outpatient cultures also grew citrobacter werkmanii and enterobacter cloacae. PD catheter removed on 12/20. Tunneled HD catheter placed on 12/26. Patient stable for discharge to SNF once bed is available.   Assessment and Plan:  Klebsiella pneumoniae peritonitis In setting of peritoneal dialysis history. Patient started on empiric Vancomycin/Cefepime. Body fluid culture significant for ampicillin resistant klebsiella pneumoniae infection. Blood cultures negative. PD catheter removed on 12/20. Patient transitioned to Cefepime monotherapy and finally Ciprofloxacin. Patient completed antibiotic course.  ESRD on hemodialysis Nephrology consulted. Patient transitioned from peritoneal dialysis to hemodialysis. IR consulted for dialysis catheter placement which was performed on 12/18. Hemodialysis started on 12/18. PD catheter removed on 12/20. Tunneled HD catheter placed on 12/26.  Acute metabolic/toxic encephalopathy Possibly related to uremia vs Cefepime vs both. CT head unremarkable for acute process. Mental status significantly improved 12/20 after HD. Since mental status improved with HD while on Cefepime, Cefepime not discontinued. Resolved.  Leukocytosis Secondary to infection. Resolved.  Metabolic acidosis with increased anion gap Secondary to renal failure. Management with HD. Resolved.  Anemia of chronic kidney disease Hemoglobin dropped to 6.9 this  morning. No obvious evidence of bleeding. 1 unit of PRBC given with post-transfusion hemoglobin of 9.2. Drifted down again to 6.9 on 12/29; 1 unit of PRBC ordered with post transfusion hemoglobin significantly elevated at 9.3. Hemoglobin down to 8.8 today. -Hemoglobin and hematocrit tomorrow  Primary hypertension -Continue amlodipine, irbesartan, spironolactone  Lupus -Continue Plaquenil  GERD CT abdomen/pelvis significant for distal esophageal thickening which may be consistent with esophagitis. Recommendation for upper endoscopy. Patient appears to be asymptomatic. Outpatient follow-up.   DVT prophylaxis: Heparin subq Code Status:   Code Status: Full Code Family Communication: None at bedside. Disposition Plan: Discharge to SNF. Medically stable for discharge once hemoglobin stable.   Consultants:  Nephrology General surgery  Procedures:  Hemodialysis  Antimicrobials: Vancomycin IV Cefepime IV   Subjective: No concerns this morning.  Objective: BP 127/75 (BP Location: Right Arm)   Pulse 91   Temp 99.1 F (37.3 C) (Oral)   Resp 17   Ht '5\' 2"'$  (1.575 m)   Wt 46.2 kg   SpO2 95%   BMI 18.63 kg/m   Examination:  General: Well appearing, no distress   Data Reviewed: I have personally reviewed following labs and imaging studies  CBC Lab Results  Component Value Date   WBC 9.6 11/01/2022   RBC 3.44 (L) 11/01/2022   HGB 9.0 (L) 11/02/2022   HCT 28.9 (L) 11/02/2022   MCV 85.2 11/01/2022   MCH 25.6 (L) 11/01/2022   PLT 311 11/01/2022   MCHC 30.0 11/01/2022   RDW 17.3 (H) 11/01/2022   LYMPHSABS 0.8 10/28/2022   MONOABS 0.8 10/28/2022   EOSABS 0.2 10/28/2022   BASOSABS 0.0 84/69/6295     Last metabolic panel Lab Results  Component Value Date   NA 134 (L) 11/01/2022   K 3.5 11/01/2022   CL 97 (L) 11/01/2022   CO2 26 11/01/2022   BUN 30 (H) 11/01/2022   CREATININE  7.35 (H) 11/01/2022   GLUCOSE 115 (H) 11/01/2022   GFRNONAA 6 (L) 11/01/2022   GFRAA  5 (L) 05/21/2020   CALCIUM 7.5 (L) 11/01/2022   PHOS 3.5 11/01/2022   PROT 6.8 10/16/2022   ALBUMIN <1.5 (L) 11/01/2022   LABGLOB 3.0 05/21/2020   AGRATIO 1.1 (L) 05/21/2020   BILITOT 2.3 (H) 10/16/2022   ALKPHOS 48 10/16/2022   AST 15 10/16/2022   ALT 10 10/16/2022   ANIONGAP 11 11/01/2022    GFR: Estimated Creatinine Clearance: 5.3 mL/min (A) (by C-G formula based on SCr of 7.35 mg/dL (H)).  No results found for this or any previous visit (from the past 240 hour(s)).     Radiology Studies: No results found.    LOS: 17 days    Cordelia Poche, MD [facility] 11/02/2022, 10:50 AM   If 7PM-7AM, please contact night-coverage [email or web address]

## 2022-11-02 NOTE — Progress Notes (Signed)
[facility] Progress Note   Subjective:   patient seen and examined bedside. No acute events overnight. Still having some issues with constipation.  Objective Vitals:   11/01/22 1146 11/01/22 1643 11/01/22 2051 11/02/22 0616  BP:  115/73 (!) 143/83 127/75  Pulse:  93 97 91  Resp:  '17 19 17  '$ Temp:  99.4 F (37.4 C) 100 F (37.8 C) 99.1 F (37.3 C)  TempSrc:  Oral Oral Oral  SpO2:  99% 98% 95%  Weight: 46.2 kg     Height:       Physical Exam General: Alert female in NAD Heart: RRR, no murmurs, rubs or gallops Lungs: CTA bilaterally Abdomen: PD catheter removed, +BS  Extremities: No edema b/l lower extremities Dialysis Access: tunneled R IJ catheter in place c/d/i  Additional Objective Labs: Basic Metabolic Panel: Recent Labs  Lab 10/27/22 0418 10/28/22 0754 11/01/22 0828  NA 133* 134* 134*  K 3.6 3.7 3.5  CL 97* 101 97*  CO2 '23 22 26  '$ GLUCOSE 83 92 115*  BUN 34* 45* 30*  CREATININE 6.98* 8.76* 7.35*  CALCIUM 7.4* 7.5* 7.5*  PHOS 5.4*  --  3.5   Liver Function Tests: Recent Labs  Lab 10/27/22 0418 11/01/22 0828  ALBUMIN <1.5* <1.5*   No results for input(s): "LIPASE", "AMYLASE" in the last 168 hours. CBC: Recent Labs  Lab 10/27/22 0418 10/28/22 0754 10/31/22 0148 10/31/22 1800 11/01/22 0429 11/02/22 0324  WBC 8.5 8.8  --   --  9.6  --   NEUTROABS  --  6.8  --   --   --   --   HGB 8.5* 7.6*   < > 9.3* 8.8* 9.0*  HCT 28.8* 24.6*   < > 29.4* 29.3* 28.9*  MCV 87.3 84.5  --   --  85.2  --   PLT 272 268  --   --  311  --    < > = values in this interval not displayed.   Blood Culture    Component Value Date/Time   SDES CATH TIP 10/23/2022 1015   SPECREQUEST NONE 10/23/2022 1015   CULT  10/23/2022 1015    No growth aerobically or anaerobically. Performed at [facility], [place] [address], [place], [place] [address]    REPTSTATUS 10/28/2022 FINAL 10/23/2022 1015    Cardiac Enzymes: No results for input(s): "CKTOTAL",  "CKMB", "CKMBINDEX", "TROPONINI" in the last 168 hours. CBG: No results for input(s): "GLUCAP" in the last 168 hours. Iron Studies: No results for input(s): "IRON", "TIBC", "TRANSFERRIN", "FERRITIN" in the last 72 hours. '@lablastinr3'$ @ Studies/Results: No results found. Medications:  sodium chloride 10 mL/hr at 10/31/22 9381    amLODipine  10 mg Oral Daily   calcium acetate  667 mg Oral TID WC   Chlorhexidine Gluconate Cloth  6 each Topical Daily   darbepoetin (ARANESP) injection - DIALYSIS  100 mcg Subcutaneous Q Sun-1800   feeding supplement (NEPRO CARB STEADY)  237 mL Oral BID BM   gentamicin cream  1 Application Topical Daily   heparin injection (subcutaneous)  5,000 Units Subcutaneous Q8H   hydroxychloroquine  200 mg Oral QODAY   irbesartan  300 mg Oral QPM   melatonin  3 mg Oral QHS   multivitamin  1 tablet Oral QHS   pantoprazole  40 mg Oral Daily   polyethylene glycol  17 g Oral Daily   propranolol  40 mg Oral QID   senna-docusate  2 tablet Oral QHS   sodium  chloride flush  3 mL Intravenous Q12H   spironolactone  25 mg Oral Daily    Dialysis Orders: Former PD -> transitioned to HD. [name].  Assessment/Plan: Polymicrobial PD Peritonitis; OP cultures showing  Klebsiella, Citrobacter, enterobacter R to cefazolin but otherwise pan-sensitive.  PD cath not functioning anyway so will convert to hemo.  D/w pt- disappointed but in agreement.  PD catheter was removed 12/21 Repeat Bcx ordered 12/20 - remain neg. cefepime through 12/28 PD cath malfunction: removed, converted to HD as above ESRD: Formerly PD, converted to HD.  [place] conversion 12/26. Outpt CLIP -davita [name] 10:15 chair time arranged. Will keep on TTS schedule, HD Tues. Will plan for HD 2nd shift if possible as per patient request HTN: BP controlled, euvolemic on exam on CCB, MRB, ARB as outpt Hx/o SLE: per primary team Anemia: Hgb 9, s/p 1u prbc yesterday. No obvious bleeding.  Receiving prn Transfusions <7.   Also on ESA.  [place] for d/c from my perspective.  Pending d/c to SNF   Gean Quint, MD [facility]

## 2022-11-03 DIAGNOSIS — N186 End stage renal disease: Secondary | ICD-10-CM | POA: Diagnosis not present

## 2022-11-03 DIAGNOSIS — K650 Generalized (acute) peritonitis: Secondary | ICD-10-CM | POA: Diagnosis not present

## 2022-11-03 DIAGNOSIS — I10 Essential (primary) hypertension: Secondary | ICD-10-CM | POA: Diagnosis not present

## 2022-11-03 DIAGNOSIS — K2100 Gastro-esophageal reflux disease with esophagitis, without bleeding: Secondary | ICD-10-CM | POA: Diagnosis not present

## 2022-11-03 HISTORY — PX: KIDNEY TRANSPLANT: SHX239

## 2022-11-03 LAB — GLUCOSE, CAPILLARY: Glucose-Capillary: 92 mg/dL (ref 70–99)

## 2022-11-03 MED ORDER — CHLORHEXIDINE GLUCONATE CLOTH 2 % EX PADS
6.0000 | MEDICATED_PAD | Freq: Every day | CUTANEOUS | Status: DC
  Administered 2022-11-05 – 2022-11-06 (×2): 6 via TOPICAL

## 2022-11-03 NOTE — Progress Notes (Signed)
[facility] PROGRESS NOTE  Assessment/ Plan: Dialysis Orders: Former PD -> transitioned to HD. [name].  # Polymicrobial PD Peritonitis; OP cultures showing  Klebsiella, Citrobacter, enterobacter R to cefazolin but otherwise pan-sensitive. PD catheter was removed 12/21 Repeat Bcx ordered 12/20 - remain neg. cefepime through 12/28.  # ESRD: Formerly PD, converted to HD.  [place] conversion on 12/26. Outpt CLIP -davita [name] 10:15 chair time arranged. Will keep on TTS schedule, HD Tues.   #HTN: BP controlled, euvolemic on exam.  Continue current antihypertensives.  # h/o SLE: per primary team.  # Anemia: Hgb 9, s/p 1u prbc. No obvious bleeding.  Receiving prn Transfusions <7.  Continue ESA.  # CKD-MBD: Phosphorus at goal.  Continue PhosLo.   [place] for d/c from my perspective.  Pending d/c to SNF  Subjective: Seen and examined at bedside.  Denies nausea, vomiting, chest pain, shortness of breath.  The catheter site had some oozing yesterday which was dressed well.  No bleeding today. Objective Vital signs in last 24 hours: Vitals:   11/02/22 2012 11/03/22 0510 11/03/22 0615 11/03/22 1028  BP: 129/77 125/76  117/72  Pulse: 90 89  84  Resp: '18 18  15  '$ Temp: 98.8 F (37.1 C) 98.6 F (37 C)  98.5 F (36.9 C)  TempSrc: Oral Oral  Oral  SpO2: 96% 98%  96%  Weight:   48.4 kg   Height:       Weight change: 0.8 kg  Intake/Output Summary (Last 24 hours) at 11/03/2022 1401 Last data filed at 11/03/2022 0600 Gross per 24 hour  Intake 340 ml  Output 0 ml  Net 340 ml       Labs: RENAL PANEL Recent Labs    10/16/22 0031 10/18/22 0440 10/19/22 0316 10/21/22 0326 10/21/22 1301 10/21/22 1548 10/23/22 0820 10/23/22 1600 10/27/22 0418 10/28/22 0754 11/01/22 0828  NA 133* 132* 131* 133* 134* 135 135 137 133* 134* 134*  K 3.6 4.2 4.2 5.5* 3.3* 3.5 4.0 3.8 3.6 3.7 3.5  CL 96* 95* 95* 96* 97* 95* 98 99 97* 101 97*  CO2 19* 15* 17* 12*  --  20*  --   '23 23 22 26  '$ GLUCOSE 122* 107* 113* 55* 48* 102* 94 85 83 92 115*  BUN 75* 97* 100* 125* 38* 49* 45* 51* 34* 45* 30*  CREATININE 13.01* 15.19* 15.19* 17.20* 7.50* 8.64* 8.30* 8.33* 6.98* 8.76* 7.35*  CALCIUM 7.5* 6.9* 6.9* 7.5*  --  7.6*  --  6.8* 7.4* 7.5* 7.5*  MG 1.7  --   --   --   --   --   --   --   --   --   --   PHOS  --   --   --   --   --   --   --   --  5.4*  --  3.5  ALBUMIN 2.0*  --   --   --   --   --   --   --  <1.5*  --  <1.5*     Liver Function Tests: Recent Labs  Lab 11/01/22 0828  ALBUMIN <1.5*   No results for input(s): "LIPASE", "AMYLASE" in the last 168 hours. No results for input(s): "AMMONIA" in the last 168 hours. CBC: Recent Labs    10/28/22 0754 10/31/22 0148 10/31/22 1800 11/01/22 0429 11/02/22 0324  HGB 7.6* 6.9* 9.3* 8.8* 9.0*  MCV 84.5  --   --  85.2  --  Cardiac Enzymes: No results for input(s): "CKTOTAL", "CKMB", "CKMBINDEX", "TROPONINI" in the last 168 hours. CBG: Recent Labs  Lab 11/03/22 0729  GLUCAP 92    Iron Studies: No results for input(s): "IRON", "TIBC", "TRANSFERRIN", "FERRITIN" in the last 72 hours. Studies/Results: No results found.  Medications: Infusions:  sodium chloride 10 mL/hr at 10/31/22 2411    Scheduled Medications:  amLODipine  10 mg Oral Daily   calcium acetate  667 mg Oral TID WC   Chlorhexidine Gluconate Cloth  6 each Topical Daily   darbepoetin (ARANESP) injection - DIALYSIS  100 mcg Subcutaneous Q Sun-1800   docusate sodium  100 mg Oral BID   feeding supplement (NEPRO CARB STEADY)  237 mL Oral BID BM   gentamicin cream  1 Application Topical Daily   heparin injection (subcutaneous)  5,000 Units Subcutaneous Q8H   hydroxychloroquine  200 mg Oral QODAY   irbesartan  300 mg Oral QPM   melatonin  3 mg Oral QHS   multivitamin  1 tablet Oral QHS   pantoprazole  40 mg Oral Daily   propranolol  40 mg Oral QID   senna-docusate  2 tablet Oral QHS   sodium chloride flush  3 mL Intravenous Q12H    spironolactone  25 mg Oral Daily    have reviewed scheduled and prn medications.  Physical Exam: General:NAD, comfortable Heart:RRR, s1s2 nl Lungs:clear b/l, no crackle Abdomen:soft, Non-tender, non-distended Extremities:No edema Dialysis Access: Right IJ TDC in place, dressing on, no sign of bleeding.  Trestan Vahle Reesa Chew Linh Hedberg 11/03/2022,2:01 PM  LOS: 18 days

## 2022-11-03 NOTE — Progress Notes (Signed)
PROGRESS NOTE    Sheena Smith  CWC:[id number] DOB: August 30, 1951 DOA: 10/15/2022 PCP: [name], MD   Brief Narrative: Sheena Smith is a 72 y.o. female with a history of hypertension, ESRD on PD and lupus. Patient presented secondary to abdominal pain and was found to have evidence of acute peritonitis related to peritoneal dialysis. Patient started on empiric Vancomycin/Cefepime and transitioned to hemodialysis. Patient was transitioned to Cefepime IV monotherapy for treatment of Klebsiella pneumoniae infection, however outpatient cultures also grew citrobacter werkmanii and enterobacter cloacae. PD catheter removed on 12/20. Tunneled HD catheter placed on 12/26. Patient stable for discharge to SNF once bed is available.   Assessment and Plan:  Klebsiella pneumoniae peritonitis In setting of peritoneal dialysis history. Patient started on empiric Vancomycin/Cefepime. Body fluid culture significant for ampicillin resistant klebsiella pneumoniae infection. Blood cultures negative. PD catheter removed on 12/20. Patient transitioned to Cefepime monotherapy and finally Ciprofloxacin. Patient completed antibiotic course.  ESRD on hemodialysis Nephrology consulted. Patient transitioned from peritoneal dialysis to hemodialysis. IR consulted for dialysis catheter placement which was performed on 12/18. Hemodialysis started on 12/18. PD catheter removed on 12/20. Tunneled HD catheter placed on 12/26.  Acute uremic encephalopathy CT head unremarkable for acute process. Mental status significantly improved 12/20 after HD. Since mental status improved with HD while on Cefepime, not toxic encephalopathy secondary Cefepime. Resolved.  Leukocytosis Secondary to infection. Resolved.  Metabolic acidosis with increased anion gap Secondary to renal failure. Management with HD. Resolved.  Anemia of chronic kidney disease Hemoglobin dropped to 6.9 this morning. No obvious evidence of  bleeding. 1 unit of PRBC given with post-transfusion hemoglobin of 9.2. Drifted down again to 6.9 on 12/29; 1 unit of PRBC ordered with post transfusion hemoglobin significantly elevated at 9.3. Hemoglobin stable. Primary hypertension -Continue amlodipine, irbesartan, spironolactone  Lupus -Continue Plaquenil  GERD CT abdomen/pelvis significant for distal esophageal thickening which may be consistent with esophagitis. Recommendation for upper endoscopy. Patient appears to be asymptomatic. Outpatient follow-up.   DVT prophylaxis: Heparin subq Code Status:   Code Status: Full Code Family Communication: None at bedside. Disposition Plan: Discharge to SNF. Medically stable for discharge once hemoglobin stable.   Consultants:  Nephrology General surgery  Procedures:  Hemodialysis  Antimicrobials: Vancomycin IV Cefepime IV Ciprofloxacin   Subjective: Patient with some oozing from catheter site. No other issues. Hoping to ambulate today.  Objective: BP 117/72 (BP Location: Right Arm)   Pulse 84   Temp 98.5 F (36.9 C) (Oral)   Resp 15   Ht '5\' 2"'$  (1.575 m)   Wt 48.4 kg   SpO2 96%   BMI 19.52 kg/m   Examination:  General exam: Appears calm and comfortable Respiratory system: Respiratory effort normal. Central nervous system: Alert and oriented. Psychiatry: Judgement and insight appear normal. Mood & affect appropriate.    Data Reviewed: I have personally reviewed following labs and imaging studies  CBC Lab Results  Component Value Date   WBC 9.6 11/01/2022   RBC 3.44 (L) 11/01/2022   HGB 9.0 (L) 11/02/2022   HCT 28.9 (L) 11/02/2022   MCV 85.2 11/01/2022   MCH 25.6 (L) 11/01/2022   PLT 311 11/01/2022   MCHC 30.0 11/01/2022   RDW 17.3 (H) 11/01/2022   LYMPHSABS 0.8 10/28/2022   MONOABS 0.8 10/28/2022   EOSABS 0.2 10/28/2022   BASOSABS 0.0 76/16/0737     Last metabolic panel Lab Results  Component Value Date   NA 134 (L) 11/01/2022   K 3.5 11/01/2022  CL 97 (L) 11/01/2022   CO2 26 11/01/2022   BUN 30 (H) 11/01/2022   CREATININE 7.35 (H) 11/01/2022   GLUCOSE 115 (H) 11/01/2022   GFRNONAA 6 (L) 11/01/2022   GFRAA 5 (L) 05/21/2020   CALCIUM 7.5 (L) 11/01/2022   PHOS 3.5 11/01/2022   PROT 6.8 10/16/2022   ALBUMIN <1.5 (L) 11/01/2022   LABGLOB 3.0 05/21/2020   AGRATIO 1.1 (L) 05/21/2020   BILITOT 2.3 (H) 10/16/2022   ALKPHOS 48 10/16/2022   AST 15 10/16/2022   ALT 10 10/16/2022   ANIONGAP 11 11/01/2022    GFR: Estimated Creatinine Clearance: 5.5 mL/min (A) (by C-G formula based on SCr of 7.35 mg/dL (H)).  No results found for this or any previous visit (from the past 240 hour(s)).     Radiology Studies: No results found.    LOS: 18 days    Cordelia Poche, MD [facility] 11/03/2022, 3:08 PM   If 7PM-7AM, please contact night-coverage [email or web address]

## 2022-11-04 DIAGNOSIS — K2100 Gastro-esophageal reflux disease with esophagitis, without bleeding: Secondary | ICD-10-CM | POA: Diagnosis not present

## 2022-11-04 DIAGNOSIS — N186 End stage renal disease: Secondary | ICD-10-CM | POA: Diagnosis not present

## 2022-11-04 DIAGNOSIS — K650 Generalized (acute) peritonitis: Secondary | ICD-10-CM | POA: Diagnosis not present

## 2022-11-04 DIAGNOSIS — I10 Essential (primary) hypertension: Secondary | ICD-10-CM | POA: Diagnosis not present

## 2022-11-04 LAB — RENAL FUNCTION PANEL
Albumin: 1.7 g/dL — ABNORMAL LOW (ref 3.5–5.0)
Anion gap: 11 (ref 5–15)
BUN: 47 mg/dL — ABNORMAL HIGH (ref 8–23)
CO2: 25 mmol/L (ref 22–32)
Calcium: 7.7 mg/dL — ABNORMAL LOW (ref 8.9–10.3)
Chloride: 95 mmol/L — ABNORMAL LOW (ref 98–111)
Creatinine, Ser: 9.13 mg/dL — ABNORMAL HIGH (ref 0.44–1.00)
GFR, Estimated: 4 mL/min — ABNORMAL LOW (ref >60)
Glucose, Bld: 129 mg/dL — ABNORMAL HIGH (ref 70–99)
Phosphorus: 3.8 mg/dL (ref 2.5–4.6)
Potassium: 4 mmol/L (ref 3.5–5.1)
Sodium: 131 mmol/L — ABNORMAL LOW (ref 135–145)

## 2022-11-04 LAB — CBC
HCT: 27.8 % — ABNORMAL LOW (ref 36.0–46.0)
Hemoglobin: 8.5 g/dL — ABNORMAL LOW (ref 12.0–15.0)
MCH: 26.1 pg (ref 26.0–34.0)
MCHC: 30.6 g/dL (ref 30.0–36.0)
MCV: 85.3 fL (ref 80.0–100.0)
Platelets: 365 10*3/uL (ref 150–400)
RBC: 3.26 MIL/uL — ABNORMAL LOW (ref 3.87–5.11)
RDW: 17.2 % — ABNORMAL HIGH (ref 11.5–15.5)
WBC: 9.5 10*3/uL (ref 4.0–10.5)
nRBC: 0 % (ref 0.0–0.2)

## 2022-11-04 MED ORDER — ALTEPLASE 2 MG IJ SOLR: 2.0000 mg | Freq: Once | INTRAMUSCULAR | Status: DC | PRN

## 2022-11-04 MED ORDER — ANTICOAGULANT SODIUM CITRATE 4% (200MG/5ML) IV SOLN
5.0000 mL | Status: DC | PRN
  Filled 2022-11-04: qty 5

## 2022-11-04 MED ORDER — MELATONIN 5 MG PO TABS
5.0000 mg | ORAL_TABLET | Freq: Every day | ORAL | Status: DC
  Administered 2022-11-04 – 2022-11-05 (×2): 5 mg via ORAL
  Filled 2022-11-04 (×2): qty 1

## 2022-11-04 MED ORDER — HEPARIN SODIUM (PORCINE) 1000 UNIT/ML DIALYSIS
1000.0000 [IU] | INTRAMUSCULAR | Status: DC | PRN
  Filled 2022-11-04 (×2): qty 1

## 2022-11-04 MED ORDER — HEPARIN SODIUM (PORCINE) 1000 UNIT/ML DIALYSIS
20.0000 [IU]/kg | INTRAMUSCULAR | Status: DC | PRN
  Filled 2022-11-04: qty 1

## 2022-11-04 NOTE — Progress Notes (Signed)
[facility] NEPHROLOGY PROGRESS NOTE  Assessment/ Plan: Dialysis Orders: Former PD -> transitioned to HD. [name].  # Polymicrobial PD Peritonitis; OP cultures showing  Klebsiella, Citrobacter, enterobacter R to cefazolin but otherwise pan-sensitive. PD catheter was removed 12/21 Repeat Bcx ordered 12/20 - remain neg. cefepime through 12/28.  # ESRD: Formerly PD, converted to HD.  [place] conversion on 12/26. Outpt CLIP -davita [name] 10:15 chair time arranged. Will keep on TTS schedule, HD Tues.  HD today  #HTN: BP controlled, euvolemic on exam.  Continue current antihypertensives.  # h/o SLE: per primary team.  # Anemia: Hgb 9, s/p 1u prbc. No obvious bleeding.  Receiving prn Transfusions <7.  Continue ESA.  # CKD-MBD: Phosphorus at goal.  Continue PhosLo.   [place] for d/c from my perspective.  Pending d/c to SNF  Subjective: Seen and examined at bedside.  No new event.  Denies any complaint or concern.  Objective Vital signs in last 24 hours: Vitals:   11/03/22 2113 11/04/22 0500 11/04/22 0530 11/04/22 0929  BP: 133/79  134/77 123/74  Pulse: 89  90 92  Resp: '17  17 18  '$ Temp: 98.9 F (37.2 C)  97.9 F (36.6 C) 98.3 F (36.8 C)  TempSrc: Oral   Oral  SpO2: 97%  93% 100%  Weight:  48.4 kg    Height:       Weight change: 0 kg  Intake/Output Summary (Last 24 hours) at 11/04/2022 1059 Last data filed at 11/04/2022 0900 Gross per 24 hour  Intake 480 ml  Output 0 ml  Net 480 ml        Labs: RENAL PANEL Recent Labs    10/16/22 0031 10/18/22 0440 10/19/22 0316 10/21/22 0326 10/21/22 1301 10/21/22 1548 10/23/22 0820 10/23/22 1600 10/27/22 0418 10/28/22 0754 11/01/22 0828  NA 133* 132* 131* 133* 134* 135 135 137 133* 134* 134*  K 3.6 4.2 4.2 5.5* 3.3* 3.5 4.0 3.8 3.6 3.7 3.5  CL 96* 95* 95* 96* 97* 95* 98 99 97* 101 97*  CO2 19* 15* 17* 12*  --  20*  --  '23 23 22 26  '$ GLUCOSE 122* 107* 113* 55* 48* 102* 94 85 83 92 115*  BUN 75* 97* 100*  125* 38* 49* 45* 51* 34* 45* 30*  CREATININE 13.01* 15.19* 15.19* 17.20* 7.50* 8.64* 8.30* 8.33* 6.98* 8.76* 7.35*  CALCIUM 7.5* 6.9* 6.9* 7.5*  --  7.6*  --  6.8* 7.4* 7.5* 7.5*  MG 1.7  --   --   --   --   --   --   --   --   --   --   PHOS  --   --   --   --   --   --   --   --  5.4*  --  3.5  ALBUMIN 2.0*  --   --   --   --   --   --   --  <1.5*  --  <1.5*      Liver Function Tests: Recent Labs  Lab 11/01/22 0828  ALBUMIN <1.5*    No results for input(s): "LIPASE", "AMYLASE" in the last 168 hours. No results for input(s): "AMMONIA" in the last 168 hours. CBC: Recent Labs    10/28/22 0754 10/31/22 0148 10/31/22 1800 11/01/22 0429 11/02/22 0324  HGB 7.6* 6.9* 9.3* 8.8* 9.0*  MCV 84.5  --   --  85.2  --      Cardiac Enzymes:  No results for input(s): "CKTOTAL", "CKMB", "CKMBINDEX", "TROPONINI" in the last 168 hours. CBG: Recent Labs  Lab 11/03/22 0729  GLUCAP 92     Iron Studies: No results for input(s): "IRON", "TIBC", "TRANSFERRIN", "FERRITIN" in the last 72 hours. Studies/Results: No results found.  Medications: Infusions:  sodium chloride 10 mL/hr at 10/31/22 6060   anticoagulant sodium citrate      Scheduled Medications:  amLODipine  10 mg Oral Daily   calcium acetate  667 mg Oral TID WC   Chlorhexidine Gluconate Cloth  6 each Topical Daily   Chlorhexidine Gluconate Cloth  6 each Topical Q0600   darbepoetin (ARANESP) injection - DIALYSIS  100 mcg Subcutaneous Q Sun-1800   docusate sodium  100 mg Oral BID   feeding supplement (NEPRO CARB STEADY)  237 mL Oral BID BM   gentamicin cream  1 Application Topical Daily   heparin injection (subcutaneous)  5,000 Units Subcutaneous Q8H   hydroxychloroquine  200 mg Oral QODAY   irbesartan  300 mg Oral QPM   melatonin  5 mg Oral QHS   multivitamin  1 tablet Oral QHS   pantoprazole  40 mg Oral Daily   propranolol  40 mg Oral QID   senna-docusate  2 tablet Oral QHS   sodium chloride flush  3 mL Intravenous  Q12H   spironolactone  25 mg Oral Daily    have reviewed scheduled and prn medications.  Physical Exam: General:NAD, comfortable Heart:RRR, s1s2 nl Lungs:clear b/l, no crackle Abdomen:soft, Non-tender, non-distended Extremities:No edema Dialysis Access: Right IJ TDC in place, dressing on, no sign of bleeding.  Sheena Smith Sheena Smith 11/04/2022,10:59 AM  LOS: 19 days

## 2022-11-04 NOTE — Consult Note (Signed)
   [facility] CM Inpatient Consult   11/04/2022  Sheena Smith Sep 29, 1951 [id number]  [place]  Accountable Care Organization [ACO] Patient: [employer] Medicare  Primary Care Provider:  [name], MD is an [place] [facility] [place] provider  Patient being recommended for a skilled nursing facility level of care. Met with patient at the bedside to confirm PCP and disposition for needs.  Patient states she was driving prior to admission and she has two daughters who can assist for her transport to HD after her rehab.  Patient was reviewed for length of stay of 19 days and for barriers to care in the community and patient now on HD. Explained to patient potential follow up at a facility, if going to a [facility] affiliated facility  Patient verbalized understanding.  If the patient goes to a [facility] affiliated facility then, patient can be followed by [facility] PAC RN with traditional Medicare and approved Medicare Advantage plans.    Plan:  Will notify [facility] RN who can follow for any known or needs for transitional care needs for returning to post facility care coordination needs to return to community.  For questions or referrals, please contact:   Natividad Brood, RN BSN [place]  [phone] business mobile phone Toll free office [phone]  *[place]  [phone] Fax number: [phone] [email or web address]

## 2022-11-04 NOTE — Progress Notes (Signed)
Physical Therapy Treatment Patient Details Name: Sheena Smith MRN: [id number] DOB: [birth year] Today's Date: 11/04/2022   History of Present Illness 72 yo female with onset of abd pain, fever and swelling was found to have sepsis from PD cath site, metabolic acidosis, anemia and elevated anion gap.  Had a missed HD session as well, admitted for follow up to intra-abdominal ABT.  PMHx:  ESRD, HTN, lupus, renal failure,    PT Comments    Continuing work on functional mobility and activity tolerance;  Session focused on progressive amb, with good progress of distance; inconsistent balance with standing and transfers per nursing; not back to baseline of walking without RW yet   Recommendations for follow up therapy are one component of a multi-disciplinary discharge planning process, led by the attending physician.  Recommendations may be updated based on patient status, additional functional criteria and insurance authorization.  Follow Up Recommendations  Skilled nursing-short term rehab (<3 hours/day) Can patient physically be transported by private vehicle: Yes   Assistance Recommended at Discharge Intermittent Supervision/Assistance  Patient can return home with the following A little help with walking and/or transfers;A little help with bathing/dressing/bathroom;Assistance with cooking/housework;Assist for transportation;Help with stairs or ramp for entrance   Equipment Recommendations  None recommended by PT    Recommendations for Other Services       Precautions / Restrictions Precautions Precautions: Fall     Mobility  Bed Mobility Overal bed mobility: Needs Assistance Bed Mobility: Supine to Sit     Supine to sit: Supervision     General bed mobility comments: Supervision for safety    Transfers Overall transfer level: Needs assistance Equipment used: Rolling walker (2 wheels) Transfers: Sit to/from Stand Sit to Stand: Min guard (without physical contact)            General transfer comment: Cues for hand placement    Ambulation/Gait Ambulation/Gait assistance: Min guard Gait Distance (Feet): 85 Feet Assistive device: Rolling walker (2 wheels) Gait Pattern/deviations: Step-through pattern       General Gait Details: Cues to self-monitor for activity tolerance    Stairs             Wheelchair Mobility    Modified Rankin (Stroke Patients Only)       Balance     Sitting balance-Leahy Scale: Fair       Standing balance-Leahy Scale: Poor (approaching Fair)                              Cognition Arousal/Alertness: Awake/alert Behavior During Therapy: WFL for tasks assessed/performed Overall Cognitive Status: Within Functional Limits for tasks assessed                                          Exercises      General Comments        Pertinent Vitals/Pain Pain Assessment Pain Assessment: No/denies pain Pain Intervention(s): Monitored during session    Home Living                          Prior Function            PT Goals (current goals can now be found in the care plan section) Acute Rehab PT Goals Patient Stated Goal: to get stronger and get home PT Goal Formulation: With  patient Time For Goal Achievement: 11/12/22 Potential to Achieve Goals: Good Progress towards PT goals: Progressing toward goals    Frequency    Min 2X/week      PT Plan Current plan remains appropriate    Co-evaluation              AM-PAC PT "6 Clicks" Mobility   Outcome Measure  Help needed turning from your back to your side while in a flat bed without using bedrails?: None Help needed moving from lying on your back to sitting on the side of a flat bed without using bedrails?: None Help needed moving to and from a bed to a chair (including a wheelchair)?: A Little Help needed standing up from a chair using your arms (e.g., wheelchair or bedside chair)?: A Little Help  needed to walk in hospital room?: A Little Help needed climbing 3-5 steps with a railing? : A Little 6 Click Score: 20    End of Session Equipment Utilized During Treatment: Gait belt (around axillae) Activity Tolerance: Patient tolerated treatment well;Patient limited by fatigue Patient left: with call bell/phone within reach;in chair;with chair alarm set Nurse Communication: Mobility status PT Visit Diagnosis: Unsteadiness on feet (R26.81);Muscle weakness (generalized) (M62.81);Difficulty in walking, not elsewhere classified (R26.2)     Time: 6967-8938 PT Time Calculation (min) (ACUTE ONLY): 18 min  Charges:  $Gait Training: 8-22 mins                     Roney Marion, [place] Office [phone]    Colletta [place] 11/04/2022, 3:17 PM

## 2022-11-04 NOTE — TOC Progression Note (Addendum)
Transition of Care [facility]) - Progression Note    Patient Details  Name: Henlee Donovan MRN: [id number] Date of Birth: [birth year]  Transition of Care [facility]) CM/SW [place], [place] Phone Number: 11/04/2022, 11:37 AM  Clinical Narrative:     CSW spoke with pt and dtr, Camisha. Bed offers were provided along with Medicare. Gov info. They will review and follow up with CSW with their choice. TOC will continue to follow for DC needs.   CSW received a call from pt's dtr who confirmed they would like to accept bed placement at [facility] care. Auth will need to be started with updated PT note. Facility and medical team notified. TOC will continue to follow.   Auth pending for [facility], will need to get confirmation from facility.   Expected Discharge Plan: [place] Barriers to Discharge: Insurance Authorization, SNF Pending bed offer  Expected Discharge Plan and Services       Living arrangements for the past 2 months: Single Family Home                                       Social Determinants of Health (SDOH) Interventions SDOH Screenings   Food Insecurity: No Food Insecurity (10/31/2022)  Housing: Low Risk  (10/31/2022)  Transportation Needs: No Transportation Needs (10/31/2022)  Utilities: Not At Risk (10/31/2022)  Tobacco Use: Medium Risk (10/31/2022)    Readmission Risk Interventions     No data to display

## 2022-11-04 NOTE — Progress Notes (Signed)
   11/04/22 1852  Vitals  Temp 98.2 F (36.8 C)  Temp Source Oral  BP (!) 158/92  MAP (mmHg) 111  BP Location Left Arm  BP Method Automatic  Patient Position (if appropriate) Lying  Pulse Rate 89  Pulse Rate Source Monitor  ECG Heart Rate 89  Resp 16  Oxygen Therapy  SpO2 100 %  O2 Device Room Air  Pulse Oximetry Type Continuous   Received patient in bed to unit.  Alert and oriented.  Informed consent signed and in chart.   Treatment initiated: 1520 Treatment completed: 1843  Patient tolerated well.  Transported back to the room  Alert, without acute distress.  Hand-off given to patient's nurse.   Access used: HD cath Access issues: NA  Total UF removed: 2071m Medication(s) given: Heparin Dwells 3200 units Post HD VS: see above Post HD weight: 46.4kg   [facility]

## 2022-11-04 NOTE — Progress Notes (Signed)
PROGRESS NOTE    Sheena Smith  YOV:[id number] DOB: 1951/05/13 DOA: 10/15/2022 PCP: [name], MD   Brief Narrative: Sheena Smith is a 72 y.o. female with a history of hypertension, ESRD on PD and lupus. Patient presented secondary to abdominal pain and was found to have evidence of acute peritonitis related to peritoneal dialysis. Patient started on empiric Vancomycin/Cefepime and transitioned to hemodialysis. Patient was transitioned to Cefepime IV monotherapy for treatment of Klebsiella pneumoniae infection, however outpatient cultures also grew citrobacter werkmanii and enterobacter cloacae. PD catheter removed on 12/20. Tunneled HD catheter placed on 12/26. Patient stable for discharge to SNF once bed is available.   Assessment and Plan:  Klebsiella pneumoniae peritonitis In setting of peritoneal dialysis history. Patient started on empiric Vancomycin/Cefepime. Body fluid culture significant for ampicillin resistant klebsiella pneumoniae infection. Blood cultures negative. PD catheter removed on 12/20. Patient transitioned to Cefepime monotherapy and finally Ciprofloxacin. Patient completed antibiotic course.  ESRD on hemodialysis Nephrology consulted. Patient transitioned from peritoneal dialysis to hemodialysis. IR consulted for dialysis catheter placement which was performed on 12/18. Hemodialysis started on 12/18. PD catheter removed on 12/20. Tunneled HD catheter placed on 12/26.  Acute uremic encephalopathy CT head unremarkable for acute process. Mental status significantly improved 12/20 after HD. Since mental status improved with HD while on Cefepime, not toxic encephalopathy secondary Cefepime. Resolved.  Leukocytosis Secondary to infection. Resolved.  Metabolic acidosis with increased anion gap Secondary to renal failure. Management with HD. Resolved.  Anemia of chronic kidney disease Hemoglobin dropped to 6.9 this morning. No obvious evidence of  bleeding. 1 unit of PRBC given with post-transfusion hemoglobin of 9.2. Drifted down again to 6.9 on 12/29; 1 unit of PRBC ordered with post transfusion hemoglobin significantly elevated at 9.3. Hemoglobin stable. Primary hypertension -Continue amlodipine, irbesartan, spironolactone  Lupus -Continue Plaquenil  GERD CT abdomen/pelvis significant for distal esophageal thickening which may be consistent with esophagitis. Recommendation for upper endoscopy. Patient appears to be asymptomatic. Outpatient follow-up.   DVT prophylaxis: Heparin subq Code Status:   Code Status: Full Code Family Communication: None at bedside. Disposition Plan: Discharge to SNF. Medically stable for discharge once hemoglobin stable.   Consultants:  Nephrology General surgery  Procedures:  Hemodialysis  Antimicrobials: Vancomycin IV Cefepime IV Ciprofloxacin   Subjective: No issues overnight.  Objective: BP 134/77 (BP Location: Right Arm)   Pulse 90   Temp 97.9 F (36.6 C)   Resp 17   Ht '5\' 2"'$  (1.575 m)   Wt 48.4 kg   SpO2 93%   BMI 19.52 kg/m   Examination:  General exam: Appears calm and comfortable Respiratory system: Clear to auscultation. Respiratory effort normal. Cardiovascular system: S1 & S2 heard, RRR. No murmurs, rubs, gallops or clicks. Gastrointestinal system: Abdomen is distended, soft and nontender. Normal bowel sounds heard. Central nervous system: Alert and oriented. No focal neurological deficits. Musculoskeletal: No calf tenderness Skin: No cyanosis. No rashes Psychiatry: Judgement and insight appear normal. Mood & affect appropriate.    Data Reviewed: I have personally reviewed following labs and imaging studies  CBC Lab Results  Component Value Date   WBC 9.6 11/01/2022   RBC 3.44 (L) 11/01/2022   HGB 9.0 (L) 11/02/2022   HCT 28.9 (L) 11/02/2022   MCV 85.2 11/01/2022   MCH 25.6 (L) 11/01/2022   PLT 311 11/01/2022   MCHC 30.0 11/01/2022   RDW 17.3 (H)  11/01/2022   LYMPHSABS 0.8 10/28/2022   MONOABS 0.8 10/28/2022   EOSABS 0.2  10/28/2022   BASOSABS 0.0 01/77/9390     Last metabolic panel Lab Results  Component Value Date   NA 134 (L) 11/01/2022   K 3.5 11/01/2022   CL 97 (L) 11/01/2022   CO2 26 11/01/2022   BUN 30 (H) 11/01/2022   CREATININE 7.35 (H) 11/01/2022   GLUCOSE 115 (H) 11/01/2022   GFRNONAA 6 (L) 11/01/2022   GFRAA 5 (L) 05/21/2020   CALCIUM 7.5 (L) 11/01/2022   PHOS 3.5 11/01/2022   PROT 6.8 10/16/2022   ALBUMIN <1.5 (L) 11/01/2022   LABGLOB 3.0 05/21/2020   AGRATIO 1.1 (L) 05/21/2020   BILITOT 2.3 (H) 10/16/2022   ALKPHOS 48 10/16/2022   AST 15 10/16/2022   ALT 10 10/16/2022   ANIONGAP 11 11/01/2022    GFR: Estimated Creatinine Clearance: 5.5 mL/min (A) (by C-G formula based on SCr of 7.35 mg/dL (H)).  No results found for this or any previous visit (from the past 240 hour(s)).     Radiology Studies: No results found.    LOS: 19 days    Cordelia Poche, MD [facility] 11/04/2022, 9:10 AM   If 7PM-7AM, please contact night-coverage [email or web address]

## 2022-11-05 DIAGNOSIS — K650 Generalized (acute) peritonitis: Secondary | ICD-10-CM | POA: Diagnosis not present

## 2022-11-05 LAB — CBC
HCT: 29 % — ABNORMAL LOW (ref 36.0–46.0)
Hemoglobin: 9.1 g/dL — ABNORMAL LOW (ref 12.0–15.0)
MCH: 26.3 pg (ref 26.0–34.0)
MCHC: 31.4 g/dL (ref 30.0–36.0)
MCV: 83.8 fL (ref 80.0–100.0)
Platelets: 357 10*3/uL (ref 150–400)
RBC: 3.46 MIL/uL — ABNORMAL LOW (ref 3.87–5.11)
RDW: 17.3 % — ABNORMAL HIGH (ref 11.5–15.5)
WBC: 8.1 10*3/uL (ref 4.0–10.5)
nRBC: 0 % (ref 0.0–0.2)

## 2022-11-05 MED ORDER — CHLORHEXIDINE GLUCONATE CLOTH 2 % EX PADS: 6.0000 | MEDICATED_PAD | Freq: Every day | CUTANEOUS | Status: DC

## 2022-11-05 MED ORDER — BOOST / RESOURCE BREEZE PO LIQD CUSTOM
1.0000 | Freq: Two times a day (BID) | ORAL | Status: DC
  Administered 2022-11-05 – 2022-11-06 (×2): 1 via ORAL

## 2022-11-05 NOTE — Progress Notes (Addendum)
PROGRESS NOTE    Sheena Smith  EVO:[id number] DOB: Mar 11, 1951 DOA: 10/15/2022 PCP: [name], MD   Brief Narrative: Sheena Smith is a 72 y.o. female with a history of hypertension, ESRD on PD and lupus. Patient presented secondary to abdominal pain and was found to have evidence of acute peritonitis related to peritoneal dialysis. Patient started on empiric Vancomycin/Cefepime and transitioned to hemodialysis. Patient was transitioned to Cefepime IV monotherapy for treatment of Klebsiella pneumoniae infection, however outpatient cultures also grew citrobacter werkmanii and enterobacter cloacae. PD catheter removed on 12/20. Tunneled HD catheter placed on 12/26. Patient stable for discharge to SNF once bed is available.   Assessment and Plan: Klebsiella pneumoniae peritonitis In setting of peritoneal dialysis history. Patient started on empiric Vancomycin/Cefepime. Body fluid culture significant for ampicillin resistant klebsiella pneumoniae infection. Blood cultures negative. PD catheter removed on 12/20. Patient transitioned to Cefepime monotherapy and finally Ciprofloxacin. Patient completed antibiotic course-and doing well off antibiotics.  ESRD on hemodialysis Nephrology consulted. Patient transitioned from peritoneal dialysis to hemodialysis. IR consulted for dialysis catheter placement which was performed on 12/18. Hemodialysis started on 12/18. PD catheter removed on 12/20. Tunneled HD catheter placed on 12/26.  Acute uremic encephalopathy CT head unremarkable for acute process. Mental status significantly improved 12/20 after HD. Since mental status improved with HD while on Cefepime, not toxic encephalopathy secondary Cefepime. Resolved.  Leukocytosis Secondary to infection. Resolved.  Metabolic acidosis with increased anion gap Secondary to renal failure. Management with HD. Resolved.  Anemia of chronic kidney disease with superimposed anemia due to acute  illness/critical illness. S/p 2 units of PRBC-hemoglobin is currently stable-follow periodically.   Primary hypertension Continue amlodipine, irbesartan, spironolactone  Lupus -Continue Plaquenil  GERD CT abdomen/pelvis significant for distal esophageal thickening which may be consistent with esophagitis. Recommendation for upper endoscopy. Patient appears to be asymptomatic. Outpatient follow-up.   DVT prophylaxis: Heparin subq Code Status:   Code Status: Full Code Family Communication: None at bedside. Disposition Plan: Discharge to SNF. Medically stable for discharge once hemoglobin stable.   Consultants:  Nephrology General surgery  Procedures:  Hemodialysis  Antimicrobials: Vancomycin IV Cefepime IV Ciprofloxacin   Subjective: No issues overnight.  Objective: BP 127/81 (BP Location: Left Arm)   Pulse 95   Temp 98.7 F (37.1 C) (Oral)   Resp 20   Ht '5\' 2"'$  (1.575 m)   Wt 48 kg   SpO2 100%   BMI 19.35 kg/m   Examination: Gen Exam:Alert awake-not in any distress HEENT:atraumatic, normocephalic Chest: B/L clear to auscultation anteriorly CVS:S1S2 regular Abdomen:soft non tender, non distended Extremities:no edema Neurology: Non focal Skin: no rash   Data Reviewed: I have personally reviewed following labs and imaging studies  CBC Lab Results  Component Value Date   WBC 8.1 11/05/2022   RBC 3.46 (L) 11/05/2022   HGB 9.1 (L) 11/05/2022   HCT 29.0 (L) 11/05/2022   MCV 83.8 11/05/2022   MCH 26.3 11/05/2022   PLT 357 11/05/2022   MCHC 31.4 11/05/2022   RDW 17.3 (H) 11/05/2022   LYMPHSABS 0.8 10/28/2022   MONOABS 0.8 10/28/2022   EOSABS 0.2 10/28/2022   BASOSABS 0.0 29/93/7169     Last metabolic panel Lab Results  Component Value Date   NA 131 (L) 11/04/2022   K 4.0 11/04/2022   CL 95 (L) 11/04/2022   CO2 25 11/04/2022   BUN 47 (H) 11/04/2022   CREATININE 9.13 (H) 11/04/2022   GLUCOSE 129 (H) 11/04/2022   GFRNONAA 4 (  L) 11/04/2022    GFRAA 5 (L) 05/21/2020   CALCIUM 7.7 (L) 11/04/2022   PHOS 3.8 11/04/2022   PROT 6.8 10/16/2022   ALBUMIN 1.7 (L) 11/04/2022   LABGLOB 3.0 05/21/2020   AGRATIO 1.1 (L) 05/21/2020   BILITOT 2.3 (H) 10/16/2022   ALKPHOS 48 10/16/2022   AST 15 10/16/2022   ALT 10 10/16/2022   ANIONGAP 11 11/04/2022    GFR: Estimated Creatinine Clearance: 4.4 mL/min (A) (by C-G formula based on SCr of 9.13 mg/dL (H)).  No results found for this or any previous visit (from the past 240 hour(s)).     Radiology Studies: No results found.    LOS: 20 days    Cordelia Poche, MD [facility] 11/05/2022, 12:33 PM   If 7PM-7AM, please contact night-coverage [email or web address]

## 2022-11-05 NOTE — Progress Notes (Signed)
[name]  Assessment/ Plan: Dialysis Orders: Former PD -> transitioned to HD. [name].  # Polymicrobial PD Peritonitis; OP cultures showing  Klebsiella, Citrobacter, enterobacter R to cefazolin but otherwise pan-sensitive. PD catheter was removed 12/21 Repeat Bcx ordered 12/20 - remain neg. cefepime through 12/28.  # ESRD: Formerly PD, converted to HD.  [place] conversion on 12/26. Outpt CLIP -davita [name] 10:15 chair time arranged.  Status post HD yesterday with 2 L ultrafiltration, tolerated well.  Will keep on TTS schedule.  Next HD tomorrow.  #HTN: BP controlled, euvolemic on exam.  Continue current antihypertensives.  # h/o SLE: per primary team.  # Anemia: Hgb 9, s/p 1u prbc. No obvious bleeding.  Receiving prn Transfusions <7.  Continue ESA.  Hemoglobin uptrending.  # CKD-MBD: Phosphorus at goal.  Continue PhosLo.   [place] for d/c from my perspective.  Pending d/c to SNF  Subjective: Seen and examined at bedside.  Tolerating dialysis well.  No new event.  Denies any complaint.  Objective Vital signs in last 24 hours: Vitals:   11/04/22 1852 11/04/22 1956 11/05/22 0546 11/05/22 0925  BP: (!) 158/92 (!) 147/80 (!) 146/85 127/81  Pulse: 89 98 90 95  Resp: '16 18 18 20  '$ Temp: 98.2 F (36.8 C) 98.6 F (37 C) 98.7 F (37.1 C) 98.7 F (37.1 C)  TempSrc: Oral Oral Oral Oral  SpO2: 100% 99% 96% 100%  Weight: 46.4 kg  48 kg   Height:       Weight change: 0 kg  Intake/Output Summary (Last 24 hours) at 11/05/2022 1245 Last data filed at 11/05/2022 0313 Gross per 24 hour  Intake 240 ml  Output 2 ml  Net 238 ml        Labs: RENAL PANEL Recent Labs    10/16/22 0031 10/18/22 0440 10/19/22 0316 10/21/22 0326 10/21/22 1301 10/21/22 1548 10/23/22 0820 10/23/22 1600 10/27/22 0418 10/28/22 0754 11/01/22 0828 11/04/22 1508  NA 133* 132* 131* 133* 134* 135 135 137 133* 134* 134* 131*  K 3.6 4.2 4.2 5.5* 3.3* 3.5 4.0 3.8  3.6 3.7 3.5 4.0  CL 96* 95* 95* 96* 97* 95* 98 99 97* 101 97* 95*  CO2 19* 15* 17* 12*  --  20*  --  '23 23 22 26 25  '$ GLUCOSE 122* 107* 113* 55* 48* 102* 94 85 83 92 115* 129*  BUN 75* 97* 100* 125* 38* 49* 45* 51* 34* 45* 30* 47*  CREATININE 13.01* 15.19* 15.19* 17.20* 7.50* 8.64* 8.30* 8.33* 6.98* 8.76* 7.35* 9.13*  CALCIUM 7.5* 6.9* 6.9* 7.5*  --  7.6*  --  6.8* 7.4* 7.5* 7.5* 7.7*  MG 1.7  --   --   --   --   --   --   --   --   --   --   --   PHOS  --   --   --   --   --   --   --   --  5.4*  --  3.5 3.8  ALBUMIN 2.0*  --   --   --   --   --   --   --  <1.5*  --  <1.5* 1.7*      Liver Function Tests: Recent Labs  Lab 11/01/22 0828 11/04/22 1508  ALBUMIN <1.5* 1.7*    No results for input(s): "LIPASE", "AMYLASE" in the last 168 hours. No results for input(s): "AMMONIA" in the last 168 hours. CBC:  Recent Labs    10/31/22 1800 11/01/22 0429 11/02/22 0324 11/04/22 1508 11/05/22 0425  HGB 9.3* 8.8* 9.0* 8.5* 9.1*  MCV  --  85.2  --  85.3 83.8     Cardiac Enzymes: No results for input(s): "CKTOTAL", "CKMB", "CKMBINDEX", "TROPONINI" in the last 168 hours. CBG: Recent Labs  Lab 11/03/22 0729  GLUCAP 92     Iron Studies: No results for input(s): "IRON", "TIBC", "TRANSFERRIN", "FERRITIN" in the last 72 hours. Studies/Results: No results found.  Medications: Infusions:  sodium chloride 10 mL/hr at 10/31/22 8592    Scheduled Medications:  amLODipine  10 mg Oral Daily   calcium acetate  667 mg Oral TID WC   Chlorhexidine Gluconate Cloth  6 each Topical Daily   Chlorhexidine Gluconate Cloth  6 each Topical Q0600   darbepoetin (ARANESP) injection - DIALYSIS  100 mcg Subcutaneous Q Sun-1800   docusate sodium  100 mg Oral BID   feeding supplement (NEPRO CARB STEADY)  237 mL Oral BID BM   gentamicin cream  1 Application Topical Daily   heparin injection (subcutaneous)  5,000 Units Subcutaneous Q8H   hydroxychloroquine  200 mg Oral QODAY   irbesartan  300 mg Oral QPM    melatonin  5 mg Oral QHS   multivitamin  1 tablet Oral QHS   pantoprazole  40 mg Oral Daily   propranolol  40 mg Oral QID   senna-docusate  2 tablet Oral QHS   sodium chloride flush  3 mL Intravenous Q12H   spironolactone  25 mg Oral Daily    have reviewed scheduled and prn medications.  Physical Exam: General: Able to lie flat comfortable, not in distress Heart:RRR, s1s2 nl Lungs:clear b/l, no crackle Abdomen:soft, Non-tender, non-distended Extremities:No edema Dialysis Access: Right IJ TDC in place, dressing on, no sign of bleeding.  Sheena Smith Sheena Smith 11/05/2022,12:45 PM  LOS: 20 days

## 2022-11-05 NOTE — TOC Progression Note (Addendum)
Transition of Care [facility]) - Initial/Assessment Note    Patient Details  Name: Sheena Smith MRN: [id number] Date of Birth: 06-18-1951  Transition of Care [facility]) CM/SW Contact:    Milinda Antis, [place] Phone Number: 11/05/2022, 1:12 PM  Clinical Narrative:                 Insurance authorization is still pending.  72-  LCSW contacted [facility] to verify that they will transport patient to dialysis and is awaiting a response.   TOC following.  Expected Discharge Plan: Skilled Nursing Facility Barriers to Discharge: Insurance Authorization, SNF Pending bed offer   Patient Goals and CMS Choice Patient states their goals for this hospitalization and ongoing recovery are:: To go to rehab to regain strength CMS Medicare.gov Compare Post Acute Care list provided to:: Patient Choice offered to / list presented to : Patient, Adult Children      Expected Discharge Plan and Services       Living arrangements for the past 2 months: Single Family Home                                      Prior Living Arrangements/Services Living arrangements for the past 2 months: Single Family Home Lives with:: Self Patient language and need for interpreter reviewed:: Yes Do you feel safe going back to the place where you live?: Yes      Need for Family Participation in Patient Care: Yes (Comment) Care giver support system in place?: Yes (comment)   Criminal Activity/Legal Involvement Pertinent to Current Situation/Hospitalization: No - Comment as needed  Activities of Daily Living Home Assistive Devices/Equipment: Shower chair without back ADL Screening (condition at time of admission) Patient's cognitive ability adequate to safely complete daily activities?: Yes Is the patient deaf or have difficulty hearing?: No Does the patient have difficulty seeing, even when wearing glasses/contacts?: No Does the patient have difficulty concentrating, remembering, or making  decisions?: No Patient able to express need for assistance with ADLs?: Yes Does the patient have difficulty dressing or bathing?: No Independently performs ADLs?: Yes (appropriate for developmental age) Does the patient have difficulty walking or climbing stairs?: No Weakness of Legs: None Weakness of Arms/Hands: None  Permission Sought/Granted Permission sought to share information with : Facility Art therapist granted to share information with : Yes, Verbal Permission Granted  Share Information with NAME: daughters  Permission granted to share info w AGENCY: SNFs        Emotional Assessment Appearance:: Appears older than stated age Attitude/Demeanor/Rapport: Engaged Affect (typically observed): Accepting Orientation: : Oriented to Situation, Oriented to  Time, Oriented to Place, Oriented to Self Alcohol / Substance Use: Not Applicable Psych Involvement: No (comment)  Admission diagnosis:  Acute peritonitis ([place]) [K65.0] Peritonitis ([place]) [K65.9] Patient Active Problem List   Diagnosis Date Noted   Peritonitis ([place]) 10/17/2022   Acute peritonitis ([place]) 10/16/2022   Sepsis ([place]) 10/16/2022   ESRD on peritoneal dialysis ([place]) 09/98/3382   Metabolic acidosis with increased anion gap and accumulation of organic acids 10/16/2022   Anemia due to chronic kidney disease 10/16/2022   Essential hypertension 10/16/2022   Lupus ([place]) 10/16/2022   GERD (gastroesophageal reflux disease) 10/16/2022   PCP:  [name], MD Pharmacy:   [id number]Phone: [phone]: [phone] [id number] [address]  [place] [address] Phone: [phone] Fax: [phone]     Social Determinants of Health (SDOH) Social History: SDOH Screenings   Food Insecurity: No Food Insecurity (10/31/2022)  Housing: Low  Risk  (10/31/2022)  Transportation Needs: No Transportation Needs (10/31/2022)  Utilities: Not At Risk (10/31/2022)  Tobacco Use: Medium Risk (10/31/2022)   SDOH Interventions: Transportation Interventions: Intervention Not Indicated, Inpatient TOC, Patient Resources (Friends/Family)   Readmission Risk Interventions     No data to display

## 2022-11-05 NOTE — Progress Notes (Signed)
Nutrition Follow-up  DOCUMENTATION CODES:   Not applicable  INTERVENTION:  - Remove Nepro BID.   - Add Boost Breeze po BID, each supplement provides 250 kcal and 9 grams of protein  - [employer] q day.   NUTRITION DIAGNOSIS:   Moderate Malnutrition related to acute illness as evidenced by energy intake < 75% for > 7 days, moderate muscle depletion, moderate fat depletion.  GOAL:   Patient will meet greater than or equal to 90% of their needs  MONITOR:   PO intake, Supplement acceptance  REASON FOR ASSESSMENT:   Consult Assessment of nutrition requirement/status  ASSESSMENT:   72 y.o. female admits related to abdominal pain. PMH includes: HTN, ESRD on PD, and lupus. Pt is currently receiving medical management for acute peritonitis.  Meds reviewed: phoslo, colace, rena-vit, senokot, aldactone. Labs reviewed: Na low.   Pt reports that she has been eating better but she does not like the food here at the hospital. She states that she has not been drinking the Nepro shakes. Pt is interested in [employer] and magic cup though. RD will modify supplements and continue to monitor PO intakes. No significant wt hx per record.   NUTRITION - FOCUSED PHYSICAL EXAM:  Flowsheet Row Most Recent Value  Orbital Region Moderate depletion  Upper Arm Region Moderate depletion  Thoracic and Lumbar Region Unable to assess  Buccal Region Moderate depletion  Temple Region Moderate depletion  Clavicle Bone Region Moderate depletion  Clavicle and Acromion Bone Region Moderate depletion  Scapular Bone Region Unable to assess  Dorsal Hand Mild depletion  Patellar Region Mild depletion  Anterior Thigh Region Mild depletion  Posterior Calf Region Mild depletion  Edema (RD Assessment) None  Hair Reviewed  Eyes Reviewed  Mouth Reviewed  Skin Reviewed  Nails Reviewed       Diet Order:   Diet Order             Diet regular Room service appropriate? Yes; Fluid consistency: Thin   Diet effective now                   EDUCATION NEEDS:   Education needs have been addressed  Skin:  Skin Assessment: Skin Integrity Issues: Skin Integrity Issues:: Incisions Incisions: abdomen  Last BM:  11/05/22  Height:   Ht Readings from Last 1 Encounters:  10/16/22 '5\' 2"'$  (1.575 m)    Weight:   Wt Readings from Last 1 Encounters:  11/05/22 48 kg    Ideal Body Weight:     BMI:  Body mass index is 19.35 kg/m.  Estimated Nutritional Needs:   Kcal:  3662-9476 kcals  Protein:  65-75 gm  Fluid:  >/= 1.2 L  Thalia Bloodgood, RD, LDN, CNSC.

## 2022-11-05 NOTE — Progress Notes (Signed)
Mobility Specialist Progress Note:   11/05/22 1519  Mobility  Activity Transferred from bed to chair  Level of Assistance Minimal assist, patient does 75% or more  Assistive Device  (HHA)  Distance Ambulated (ft) 3 ft  Activity Response Tolerated well  Mobility Referral Yes  $Mobility charge 1 Mobility   Pt requested B>C transfer. Tolerated well with HHA. Left pt in chair with alarm on and all needs met.  Royetta Crochet Mobility Specialist Please contact via Solicitor or  Rehab office at [phone]

## 2022-11-05 NOTE — Plan of Care (Signed)

## 2022-11-06 DIAGNOSIS — G9349 Other encephalopathy: Secondary | ICD-10-CM | POA: Diagnosis not present

## 2022-11-06 DIAGNOSIS — M329 Systemic lupus erythematosus, unspecified: Secondary | ICD-10-CM | POA: Diagnosis not present

## 2022-11-06 DIAGNOSIS — K219 Gastro-esophageal reflux disease without esophagitis: Secondary | ICD-10-CM | POA: Diagnosis not present

## 2022-11-06 DIAGNOSIS — M3214 Glomerular disease in systemic lupus erythematosus: Secondary | ICD-10-CM | POA: Diagnosis not present

## 2022-11-06 DIAGNOSIS — M6281 Muscle weakness (generalized): Secondary | ICD-10-CM | POA: Diagnosis not present

## 2022-11-06 DIAGNOSIS — E440 Moderate protein-calorie malnutrition: Secondary | ICD-10-CM | POA: Diagnosis not present

## 2022-11-06 DIAGNOSIS — R2681 Unsteadiness on feet: Secondary | ICD-10-CM | POA: Diagnosis not present

## 2022-11-06 DIAGNOSIS — L853 Xerosis cutis: Secondary | ICD-10-CM | POA: Diagnosis not present

## 2022-11-06 DIAGNOSIS — K650 Generalized (acute) peritonitis: Secondary | ICD-10-CM | POA: Diagnosis not present

## 2022-11-06 DIAGNOSIS — I10 Essential (primary) hypertension: Secondary | ICD-10-CM | POA: Diagnosis not present

## 2022-11-06 DIAGNOSIS — R5381 Other malaise: Secondary | ICD-10-CM | POA: Diagnosis not present

## 2022-11-06 DIAGNOSIS — K659 Peritonitis, unspecified: Secondary | ICD-10-CM | POA: Diagnosis not present

## 2022-11-06 DIAGNOSIS — Z992 Dependence on renal dialysis: Secondary | ICD-10-CM | POA: Diagnosis not present

## 2022-11-06 DIAGNOSIS — K5900 Constipation, unspecified: Secondary | ICD-10-CM | POA: Diagnosis not present

## 2022-11-06 DIAGNOSIS — D631 Anemia in chronic kidney disease: Secondary | ICD-10-CM | POA: Diagnosis not present

## 2022-11-06 DIAGNOSIS — B9689 Other specified bacterial agents as the cause of diseases classified elsewhere: Secondary | ICD-10-CM | POA: Diagnosis not present

## 2022-11-06 DIAGNOSIS — G4700 Insomnia, unspecified: Secondary | ICD-10-CM | POA: Diagnosis not present

## 2022-11-06 DIAGNOSIS — N186 End stage renal disease: Secondary | ICD-10-CM | POA: Diagnosis not present

## 2022-11-06 DIAGNOSIS — Z7189 Other specified counseling: Secondary | ICD-10-CM | POA: Diagnosis not present

## 2022-11-06 LAB — CBC
HCT: 28.8 % — ABNORMAL LOW (ref 36.0–46.0)
Hemoglobin: 8.3 g/dL — ABNORMAL LOW (ref 12.0–15.0)
MCH: 25.2 pg — ABNORMAL LOW (ref 26.0–34.0)
MCHC: 28.8 g/dL — ABNORMAL LOW (ref 30.0–36.0)
MCV: 87.3 fL (ref 80.0–100.0)
Platelets: 394 10*3/uL (ref 150–400)
RBC: 3.3 MIL/uL — ABNORMAL LOW (ref 3.87–5.11)
RDW: 17.4 % — ABNORMAL HIGH (ref 11.5–15.5)
WBC: 8.2 10*3/uL (ref 4.0–10.5)
nRBC: 0 % (ref 0.0–0.2)

## 2022-11-06 LAB — RENAL FUNCTION PANEL
Albumin: 1.8 g/dL — ABNORMAL LOW (ref 3.5–5.0)
Anion gap: 11 (ref 5–15)
BUN: 33 mg/dL — ABNORMAL HIGH (ref 8–23)
CO2: 26 mmol/L (ref 22–32)
Calcium: 7.9 mg/dL — ABNORMAL LOW (ref 8.9–10.3)
Chloride: 90 mmol/L — ABNORMAL LOW (ref 98–111)
Creatinine, Ser: 6.73 mg/dL — ABNORMAL HIGH (ref 0.44–1.00)
GFR, Estimated: 6 mL/min — ABNORMAL LOW (ref >60)
Glucose, Bld: 91 mg/dL (ref 70–99)
Phosphorus: 3.3 mg/dL (ref 2.5–4.6)
Potassium: 4 mmol/L (ref 3.5–5.1)
Sodium: 127 mmol/L — ABNORMAL LOW (ref 135–145)

## 2022-11-06 MED ORDER — LIDOCAINE HCL (PF) 1 % IJ SOLN: 5.0000 mL | INTRAMUSCULAR | Status: DC | PRN

## 2022-11-06 MED ORDER — PENTAFLUOROPROP-TETRAFLUOROETH EX AERO: 1.0000 | INHALATION_SPRAY | CUTANEOUS | Status: DC | PRN

## 2022-11-06 MED ORDER — HEPARIN SODIUM (PORCINE) 1000 UNIT/ML DIALYSIS: 1000.0000 [IU] | INTRAMUSCULAR | Status: DC | PRN

## 2022-11-06 MED ORDER — ALTEPLASE 2 MG IJ SOLR: 2.0000 mg | Freq: Once | INTRAMUSCULAR | Status: DC | PRN

## 2022-11-06 MED ORDER — LIDOCAINE-PRILOCAINE 2.5-2.5 % EX CREA: 1.0000 | TOPICAL_CREAM | CUTANEOUS | Status: DC | PRN

## 2022-11-06 MED ORDER — HEPARIN SODIUM (PORCINE) 1000 UNIT/ML DIALYSIS
20.0000 [IU]/kg | INTRAMUSCULAR | Status: DC | PRN
  Administered 2022-11-06: 3200 [IU] via INTRAVENOUS_CENTRAL
  Filled 2022-11-06: qty 1

## 2022-11-06 MED ORDER — CALCIUM ACETATE (PHOS BINDER) 667 MG PO CAPS: 667.0000 mg | ORAL_CAPSULE | Freq: Three times a day (TID) | ORAL | Status: DC

## 2022-11-06 MED ORDER — MELATONIN 5 MG PO TABS: 5.0000 mg | ORAL_TABLET | Freq: Every day | ORAL | 0 refills | Status: DC

## 2022-11-06 MED ORDER — ANTICOAGULANT SODIUM CITRATE 4% (200MG/5ML) IV SOLN: 5.0000 mL | Status: DC | PRN

## 2022-11-06 MED ORDER — RENA-VITE PO TABS: 1.0000 | ORAL_TABLET | Freq: Every day | ORAL | 0 refills | Status: DC

## 2022-11-06 MED ORDER — GENTAMICIN SULFATE 0.1 % EX CREA: 1.0000 | TOPICAL_CREAM | Freq: Every day | CUTANEOUS | 0 refills | Status: DC

## 2022-11-06 NOTE — Progress Notes (Signed)
Physical Therapy Treatment Patient Details Name: Sheena Smith MRN: [id number] DOB: 1951-01-19 Today's Date: 11/06/2022   History of Present Illness 72 yo female with onset of abd pain, fever and swelling was found to have sepsis from PD cath site, metabolic acidosis, anemia and elevated anion gap.  Had a missed HD session as well, admitted for follow up to intra-abdominal ABT.  PMHx:  ESRD, HTN, lupus, renal failure,    PT Comments    Patient agreeable to PT. She reports being fatigued from minimal sleep last night and deferred ambulation. She performed seated level therapeutic exercises for strengthening BLE with assistance and cues for technique. Continue to recommend SNF at discharge as patient does not appear to be at her baseline level of functional mobility.    Recommendations for follow up therapy are one component of a multi-disciplinary discharge planning process, led by the attending physician.  Recommendations may be updated based on patient status, additional functional criteria and insurance authorization.  Follow Up Recommendations  Skilled nursing-short term rehab (<3 hours/day) Can patient physically be transported by private vehicle: Yes   Assistance Recommended at Discharge Intermittent Supervision/Assistance  Patient can return home with the following A little help with walking and/or transfers;A little help with bathing/dressing/bathroom;Assistance with cooking/housework;Assist for transportation;Help with stairs or ramp for entrance   Equipment Recommendations  None recommended by PT    Recommendations for Other Services       Precautions / Restrictions Precautions Precautions: Fall Restrictions Weight Bearing Restrictions: No     Mobility  Bed Mobility Overal bed mobility: Needs Assistance Bed Mobility: Supine to Sit, Sit to Supine     Supine to sit: Supervision Sit to supine: Min guard   General bed mobility comments: increased time and effort  required    Transfers                   General transfer comment: patient declined standing/ambulation due to fatigue. she was agreeable to seated level exercises for strengthening    Ambulation/Gait                   Stairs             Wheelchair Mobility    Modified Rankin (Stroke Patients Only)       Balance Overall balance assessment: Needs assistance Sitting-balance support: Feet supported Sitting balance-Leahy Scale: Fair Sitting balance - Comments: intermittent UE support with LE exercises. no external support required from therapist                                    Cognition Arousal/Alertness: Awake/alert Behavior During Therapy: WFL for tasks assessed/performed Overall Cognitive Status: Within Functional Limits for tasks assessed                                          Exercises General Exercises - Lower Extremity Ankle Circles/Pumps: AROM, Strengthening, Both, 10 reps, Seated Long Arc Quad: AAROM, Strengthening, Both, 10 reps, Seated Hip ABduction/ADduction: AAROM, Strengthening, Both, 10 reps, Seated Hip Flexion/Marching: AAROM, Strengthening, Both, 10 reps, Seated Other Exercises Other Exercises: verbal and tactile cues for exercise technique for strengthening    General Comments        Pertinent Vitals/Pain Pain Assessment Pain Assessment: No/denies pain    Home Living  Prior Function            PT Goals (current goals can now be found in the care plan section) Acute Rehab PT Goals Patient Stated Goal: to get stronger and get home PT Goal Formulation: With patient Time For Goal Achievement: 11/12/22 Potential to Achieve Goals: Good Progress towards PT goals: Progressing toward goals    Frequency    Min 2X/week      PT Plan Current plan remains appropriate    Co-evaluation              AM-PAC PT "6 Clicks" Mobility   Outcome  Measure  Help needed turning from your back to your side while in a flat bed without using bedrails?: None Help needed moving from lying on your back to sitting on the side of a flat bed without using bedrails?: A Little Help needed moving to and from a bed to a chair (including a wheelchair)?: A Little Help needed standing up from a chair using your arms (e.g., wheelchair or bedside chair)?: A Little Help needed to walk in hospital room?: A Little Help needed climbing 3-5 steps with a railing? : A Lot 6 Click Score: 18    End of Session   Activity Tolerance: Patient tolerated treatment well Patient left: in bed;with call bell/phone within reach   PT Visit Diagnosis: Unsteadiness on feet (R26.81);Muscle weakness (generalized) (M62.81);Difficulty in walking, not elsewhere classified (R26.2)     Time: 1037-1050 PT Time Calculation (min) (ACUTE ONLY): 13 min  Charges:  $Therapeutic Exercise: 8-22 mins                     Minna Merritts, PT, MPT    Percell Locus 11/06/2022, 1:45 PM

## 2022-11-06 NOTE — Progress Notes (Signed)
DISCHARGE NOTE HOME Sheena Smith to be discharged Nursing Home per MD order. Discussed prescriptions and follow up appointments with the patient. Prescriptions given to patient; medication list explained in detail. Patient verbalized understanding.  Skin clean, dry and intact without evidence of skin break down, no evidence of skin tears noted. IV catheter discontinued intact. Site without signs and symptoms of complications. Dressing and pressure applied. Pt denies pain at the site currently. No complaints noted.  Patient free of lines, drains, and wounds.   An After Visit Summary (AVS) was printed and given to the patient. Patient escorted via wheelchair, and discharged home via private auto.  Hassell Halim, LPN

## 2022-11-06 NOTE — Care Management Important Message (Signed)
Important Message  Patient Details  Name: Sheena Smith MRN: [id number] Date of Birth: June 19, 1951   Medicare Important Message Given:  Yes  Patient left prior to IM delivery will mail to patient home address.    Jayline Kilburg 11/06/2022, 4:36 PM

## 2022-11-06 NOTE — Progress Notes (Addendum)
Pt to d/c to snf today. CSW confirming with snf that they can transport pt to [facility] on TTS 10:15 chair time. Contacted [place] and spoke to [place]. Confirmed pt's schedule and that pt can start on Saturday. Pt will need to arrive at 9:45 am to complete paperwork prior to 10:15 chair time. This info was provided to CSW to provide to snf. Will add to AVS as well. Will fax d/c summary and last renal note to clinic once completed for continuation of care.   Melven Sartorius Renal Navigator [phone]  Addendum at 1:16 pm: D/C summary and yesterday's renal note faxed to clinic for continuation of care.

## 2022-11-06 NOTE — TOC Transition Note (Signed)
Transition of Care [facility]) - CM/SW Discharge Note   Patient Details  Name: Sheena Smith MRN: [id number] Date of Birth: 1951/02/14  [facility] CM/SW Contact:  Amador Cunas, [place] Phone Number: 11/06/2022, 1:42 PM   Clinical Narrative: Pt for dc to [facility] today. Confirmed with Tanya in admissions they are prepared to admit pt to room 5A and they will be able to provide transport to HD. Pt's dtr Camisha updated and reports family will provide transport to [facility]. RN provided with number for report. SW signing off at dc.   Wandra Feinstein, MSW, LCSW [phone] (coverage)      Final next level of care: Skilled Nursing Facility Barriers to Discharge: No Barriers Identified   Patient Goals and CMS Choice CMS Medicare.gov Compare Post Acute Care list provided to:: Patient Choice offered to / list presented to : Adult Children  Discharge Placement                Patient [facility] bed at: [facility] Patient to be transferred to facility by: [place] Name of family member notified: Camisha/dtr Patient and family notified of of transfer: 11/06/22  Discharge Plan and Services Additional resources added to the After Visit Summary for                                       Social Determinants of Health (SDOH) Interventions SDOH Screenings   Food Insecurity: No Food Insecurity (10/31/2022)  Housing: Low Risk  (10/31/2022)  Transportation Needs: No Transportation Needs (10/31/2022)  Utilities: Not At Risk (10/31/2022)  Tobacco Use: Medium Risk (10/31/2022)     Readmission Risk Interventions     No data to display

## 2022-11-06 NOTE — Discharge Summary (Signed)
PATIENT DETAILS Name: Sheena Smith Age: 72 y.o. Sex: female Date of Birth: January 01, 1951 MRN: [id number]. Admitting Physician: Norval Morton, MD [id number], [name], MD  Admit Date: 10/15/2022 Discharge date: 11/06/2022  Recommendations for Outpatient Follow-up:  Follow up with PCP in 1-2 weeks Please obtain CMP/CBC in one week Please ensure follow-up with hemodialysis clinic. Outpatient GI evaluation for EGD.  Admitted From:  Home  Disposition: Skilled nursing facility   Discharge Condition: fair  CODE STATUS:   Code Status: Full Code   Diet recommendation:  Diet Order             Diet - low sodium heart healthy           Diet regular Room service appropriate? Yes; Fluid consistency: Thin  Diet effective now                    Brief Summary: Sheena Smith is a 72 y.o. female with a history of hypertension, ESRD on PD and lupus. Patient presented secondary to abdominal pain and was found to have evidence of acute peritonitis related to peritoneal dialysis. Patient started on empiric Vancomycin/Cefepime and transitioned to hemodialysis. Patient was transitioned to Cefepime IV monotherapy for treatment of Klebsiella pneumoniae infection, however outpatient cultures also grew citrobacter werkmanii and enterobacter cloacae. PD catheter removed on 12/20. Tunneled HD catheter placed on 12/26. Patient stable for discharge to SNF once bed is available.  Brief Hospital Course: Klebsiella pneumoniae peritonitis In setting of peritoneal dialysis history. Patient started on empiric Vancomycin/Cefepime. Body fluid culture significant for ampicillin resistant klebsiella pneumoniae infection. Blood cultures negative. PD catheter removed on 12/20. Patient transitioned to Cefepime monotherapy and finally Ciprofloxacin. Patient completed antibiotic course-and doing well off antibiotics.   ESRD on hemodialysis Nephrology consulted. Patient transitioned from peritoneal  dialysis to hemodialysis. IR consulted for dialysis catheter placement which was performed on 12/18. Hemodialysis started on 12/18. PD catheter removed on 12/20. Tunneled HD catheter placed on 12/26.   Acute uremic encephalopathy CT head unremarkable for acute process. Mental status significantly improved 12/20 after HD. Since mental status improved with HD while on Cefepime, not toxic encephalopathy secondary Cefepime. Resolved.   Leukocytosis Secondary to infection. Resolved.   Metabolic acidosis with increased anion gap Secondary to renal failure. Management with HD. Resolved.   Anemia of chronic kidney disease with superimposed anemia due to acute illness/critical illness. S/p 2 units of PRBC-hemoglobin is currently stable-follow periodically.    Primary hypertension Continue amlodipine, irbesartan, spironolactone   Lupus -Continue Plaquenil   GERD CT abdomen/pelvis significant for distal esophageal thickening which may be consistent with esophagitis. Recommendation for upper endoscopy. Patient appears to be asymptomatic. Outpatient follow-up.    Nutrition Status: Nutrition Problem: Moderate Malnutrition Etiology: acute illness Signs/Symptoms: energy intake < 75% for > 7 days, moderate muscle depletion, moderate fat depletion Interventions: Boost Breeze, Magic cup      Discharge Diagnoses:  Principal Problem:   Acute peritonitis ([place]) Active Problems:   ESRD on peritoneal dialysis ([place])   Essential hypertension   Lupus ([facility])   GERD (gastroesophageal reflux disease)   Discharge Instructions:  Activity:  As tolerated with Full fall precautions use walker/cane & assistance as needed   Discharge Instructions     Diet - low sodium heart healthy   Complete by: As directed    Discharge instructions   Complete by: As directed    Follow with Primary MD  [name], MD in 1-2 weeks  Please get  a complete blood count and chemistry panel checked by your  Primary MD at your next visit, and again as instructed by your Primary MD.  Get Medicines reviewed and adjusted: Please take all your medications with you for your next visit with your Primary MD  Laboratory/radiological data: Please request your Primary MD to go over all hospital tests and procedure/radiological results at the follow up, please ask your Primary MD to get all Hospital records sent to his/her office.  In some cases, they will be blood work, cultures and biopsy results pending at the time of your discharge. Please request that your primary care M.D. follows up on these results.  Also Note the following: If you experience worsening of your admission symptoms, develop shortness of breath, life threatening emergency, suicidal or homicidal thoughts you must seek medical attention immediately by calling 911 or calling your MD immediately  if symptoms less severe.  You must read complete instructions/literature along with all the possible adverse reactions/side effects for all the Medicines you take and that have been prescribed to you. Take any new Medicines after you have completely understood and accpet all the possible adverse reactions/side effects.   Do not drive when taking Pain medications or sleeping medications (Benzodaizepines)  Do not take more than prescribed Pain, Sleep and Anxiety Medications. It is not advisable to combine anxiety,sleep and pain medications without talking with your primary care practitioner  Special Instructions: If you have smoked or chewed Tobacco  in the last 2 yrs please stop smoking, stop any regular Alcohol  and or any Recreational drug use.  Wear Seat belts while driving.  Please note: You were cared for by a hospitalist during your hospital stay. Once you are discharged, your primary care physician will handle any further medical issues. Please note that NO REFILLS for any discharge medications will be authorized once you are discharged, as  it is imperative that you return to your primary care physician (or establish a relationship with a primary care physician if you do not have one) for your post hospital discharge needs so that they can reassess your need for medications and monitor your lab values.   Discharge wound care:   Complete by: As directed    Clean skin near exit site with chloraprep swab sticks.  Starting at catheter, use circular pattern around exit site, moving towards outer edges of area covered by dressing.  Apply gentamicin cream to site once daily.  Cover with dry dressing.   Increase activity slowly   Complete by: As directed       Allergies as of 11/06/2022       Reactions   Nsaids    "Kidney Disease. Exception to this NSAID intolerance is aspirin 81-372m daily"'   Other    Per pt, states that she was informed she was allergic to sulfa after allergy testing  "Kidney Disease. Exception to this NSAID intolerance is aspirin 81-3272mdaily"'   Atenolol    palpitation   Elemental Sulfur         Medication List     STOP taking these medications    furosemide 80 MG tablet Commonly known as: LASIX   sodium bicarbonate 650 MG tablet       TAKE these medications    amLODipine 10 MG tablet Commonly known as: NORVASC Take 1 tablet by mouth daily.   calcium acetate 667 MG capsule Commonly known as: PHOSLO Take 1 capsule (667 mg total) by mouth 3 (three) times daily with  meals.   ferrous sulfate 324 MG Tbec Take 324 mg by mouth daily with breakfast.   gentamicin cream 0.1 % Commonly known as: GARAMYCIN Apply 1 Application topically daily.   hydroxychloroquine 200 MG tablet Commonly known as: PLAQUENIL Take 200 mg by mouth every other day.   irbesartan 300 MG tablet Commonly known as: AVAPRO Take 300 mg by mouth every evening.   melatonin 5 MG Tabs Take 1 tablet (5 mg total) by mouth at bedtime.   multivitamin Tabs tablet Take 1 tablet by mouth at bedtime.   pantoprazole 40 MG  tablet Commonly known as: PROTONIX Take 40 mg by mouth daily.   propranolol 20 MG tablet Commonly known as: INDERAL Take 40 mg by mouth 4 (four) times daily.   spironolactone 25 MG tablet Commonly known as: ALDACTONE Take 25 mg by mouth daily.               Discharge Care Instructions  (From admission, onward)           Start     Ordered   11/06/22 0000  Discharge wound care:       Comments: Clean skin near exit site with chloraprep swab sticks.  Starting at catheter, use circular pattern around exit site, moving towards outer edges of area covered by dressing.  Apply gentamicin cream to site once daily.  Cover with dry dressing.   11/06/22 0945            Contact information for follow-up providers     [facility] Call.   Specialty: General Surgery Why: As needed Contact information: [address] [place] [phone]             Contact information for after-discharge care     [facility] .   Service: Skilled Nursing Contact information: [place] [phone]                    Allergies  Allergen Reactions   Nsaids     "Kidney Disease. Exception to this NSAID intolerance is aspirin 81-328m daily"'   Other     Per pt, states that she was informed she was allergic to sulfa after allergy testing  "Kidney Disease. Exception to this NSAID intolerance is aspirin 81-3269mdaily"'   Atenolol     palpitation   Elemental Sulfur      Other Procedures/Studies: IR Fluoro Guide CV Line Right  Result Date: 10/30/2022 CLINICAL DATA:  Renal insufficiency, needs access for hemodialysis EXAM: TUNNELED HEMODIALYSIS CATHETER PLACEMENT WITH ULTRASOUND AND FLUOROSCOPIC GUIDANCE TECHNIQUE: The procedure, risks, benefits, and alternatives were explained to the patient. Questions regarding the procedure  were encouraged and answered. The patient understands and consents to the procedure. patency of the right IJ vein was confirmed with ultrasound with image documentation. An appropriate skin site was determined. Region was prepped using maximum barrier technique including cap and mask, sterile gown, sterile gloves, large sterile sheet, and Chlorhexidine as cutaneous antisepsis. The region was infiltrated locally with 1% lidocaine. Intravenous Fentanyl 2521mand Versed 1mg62mre administered as conscious sedation during continuous monitoring of the patient's level of consciousness and physiological / cardiorespiratory status by the radiology RN, with a total moderate sedation time of 14 minutes. Under real-time ultrasound guidance, the right IJ vein was accessed with a 21 gauge micropuncture needle; the needle tip within the vein was confirmed  with ultrasound image documentation. Needle exchanged over the 018 guidewire for transitional dilator, which allowed advancement of a Benson wire into the IVC. Over this, an MPA catheter was advanced. A Palindrome 19 hemodialysis catheter was tunneled from the right anterior chest wall approach to the right IJ dermatotomy site. The MPA catheter was exchanged over an Amplatz wire for serial vascular dilators which allow placement of a peel-away sheath, through which the catheter was advanced under intermittent fluoroscopy, positioned with its tips in the proximal and midright atrium. Spot chest radiograph confirms good catheter position. No pneumothorax. Catheter was flushed and primed per protocol. Catheter secured externally with O Prolene sutures. The right IJ dermatotomy site was closed with Dermabond. COMPLICATIONS: COMPLICATIONS None immediate FLUOROSCOPY: Radiation Exposure Index (as provided by the fluoroscopic device): Less than 0.1 mGy air Kerma COMPARISON:  None Available. IMPRESSION: 1. Technically successful placement of tunneled right IJ hemodialysis catheter with  ultrasound and fluoroscopic guidance. Ready for routine use. ACCESS: Remains approachable for percutaneous intervention as needed. Electronically Signed   By: Lucrezia Europe M.D.   On: 10/30/2022 09:19   CT HEAD WO CONTRAST (5MM)  Result Date: 10/21/2022 CLINICAL DATA:  Mental status change, unknown cause EXAM: CT HEAD WITHOUT CONTRAST TECHNIQUE: Contiguous axial images were obtained from the base of the skull through the vertex without intravenous contrast. RADIATION DOSE REDUCTION: This exam was performed according to the departmental dose-optimization program which includes automated exposure control, adjustment of the mA and/or kV according to patient size and/or use of iterative reconstruction technique. COMPARISON:  None Available. FINDINGS: Brain: No evidence of acute infarction, hemorrhage, mass, mass effect, or midline shift. No hydrocephalus or extra-axial fluid collection. Encephalomalacia in left inferior frontal lobe, vertex left frontal lobe, and lateral right frontal and temporal lobe, likely sequela of remote infarcts. Periventricular white matter changes, likely the sequela of chronic small vessel ischemic disease. Vascular: No hyperdense vessel. Skull: Normal. Negative for fracture or focal lesion. Sinuses/Orbits: No acute finding. Other: The mastoid air cells are well aerated. IMPRESSION: No acute intracranial process. Electronically Signed   By: Merilyn Baba M.D.   On: 10/21/2022 23:55   CT ABDOMEN PELVIS WO CONTRAST  Result Date: 10/21/2022 CLINICAL DATA:  Peritonitis, on peritoneal dialysis, evaluate for infection/abscess EXAM: CT ABDOMEN AND PELVIS WITHOUT CONTRAST TECHNIQUE: Multidetector CT imaging of the abdomen and pelvis was performed following the standard protocol without IV contrast. RADIATION DOSE REDUCTION: This exam was performed according to the departmental dose-optimization program which includes automated exposure control, adjustment of the mA and/or kV according to  patient size and/or use of iterative reconstruction technique. COMPARISON:  10/16/2022 FINDINGS: Motion degraded images. Lower chest: Small bilateral pleural effusions, progressive. Associated bilateral lower lobe atelectasis. Hepatobiliary: Unenhanced liver is unremarkable. Gallbladder is unremarkable. No intrahepatic or extrahepatic duct dilatation. Pancreas: Within normal limits. Spleen: Within normal limits. Adrenals/Urinary Tract: Adrenal is within normal limits. Bilateral renal parenchymal atrophy. 3 mm parenchymal calcification in the lateral interpolar left kidney (series 3/image 40). Bilateral lower pole renal sinus cysts measuring up to 2.4 cm on the right (series 3/image 42), benign. No follow-up is recommended. Bladder is underdistended but unremarkable. Stomach/Bowel: Stomach is notable for a small hiatal hernia. No evidence of bowel obstruction. Two long segments of small bowel in the central abdomen which demonstrate mildly irregular wall thickening (series 3/images 55 and 61), nonspecific but at least raising the possibility of small bowel enteritis. No associated pneumatosis or free air. Normal appendix (series 3/image 84). No colonic wall thickening  or inflammatory changes. Vascular/Lymphatic: No evidence of abdominal aortic aneurysm. Atherosclerotic calcifications of the abdominal aorta and branch vessels. No suspicious abdominopelvic lymphadenopathy. Reproductive: Heterogeneous uterus with uterine fibroids. Bilateral ovaries are within normal limits. Other: Moderate abdominopelvic ascites, mildly progressive this patient on peritoneal dialysis. Peritoneal dialysis catheter terminates in the right lower abdomen. Musculoskeletal: Subcutaneous staples along the right paramidline anterior abdominal wall. No focal osseous lesions. Mild degenerative changes at L2-3 with prominent Schmorl's node deformity along the inferior endplate of L2. IMPRESSION: Motion degraded images. Possible small bowel  enteritis involving two loops in the central abdomen, equivocal. No associated pneumatosis or free air. Moderate abdominopelvic ascites, likely related to patient's peritoneal dialysis. Small bilateral pleural effusions, progressive. Associated bilateral lower lobe atelectasis. Additional ancillary findings as above. Electronically Signed   By: Julian Hy M.D.   On: 10/21/2022 00:31   IR Fluoro Guide CV Line Right  Result Date: 10/20/2022 INDICATION: ESRD requiring HD.  Leukocytosis with suspected bacteremia. EXAM: NON-TUNNELED CENTRAL VENOUS HEMODIALYSIS CATHETER PLACEMENT WITH ULTRASOUND AND FLUOROSCOPIC GUIDANCE COMPARISON:  None Available. MEDICATIONS: None CONTRAST:  5 mL Omnipaque 300 IV FLUOROSCOPY TIME:  Fluoroscopic dose; 3 mGy COMPLICATIONS: None immediate. PROCEDURE: Informed written consent was obtained from the patient and/or patient's representative after a discussion of the risks, benefits, and alternatives to treatment. Questions regarding the procedure were encouraged and answered. The RIGHT neck and chest were prepped with chlorhexidine in a sterile fashion, and a sterile drape was applied covering the operative field. Maximum barrier sterile technique with sterile gowns and gloves were used for the procedure. A timeout was performed prior to the initiation of the procedure. After the overlying soft tissues were anesthetized, a small venotomy incision was created and a micropuncture kit was utilized to access the internal jugular vein. Real-time ultrasound guidance was utilized for vascular access including the acquisition of a permanent ultrasound image documenting patency of the accessed vessel. The microwire was utilized to measure appropriate catheter length. A stiff glidewire was advanced to the level of the IVC. Under fluoroscopic guidance, the venotomy was serially dilated, ultimately allowing placement of a 16 cm temporary Trialysis catheter with tip ultimately terminating  within the superior aspect of the right atrium. Final catheter positioning was confirmed and documented with a spot radiographic image. The catheter aspirates and flushes normally. The catheter was flushed with appropriate volume heparin dwells. The catheter exit site was secured with a 0-Prolene retention suture. A dressing was placed. The patient tolerated the procedure well without immediate post procedural complication. IMPRESSION: Successful placement of a RIGHT internal jugular approach 16 cm non tunneled/temporary dialysis catheter The tip of the catheter is positioned within the proximal RIGHT atrium. The catheter is ready for immediate use. PLAN: This catheter may be converted to a tunneled dialysis catheter at a later date as indicated. Michaelle Birks, MD [facility] Electronically Signed   By: Michaelle Birks M.D.   On: 10/20/2022 18:56   IR US Guide Vasc Access Right  Result Date: 10/20/2022 INDICATION: ESRD requiring HD.  Leukocytosis with suspected bacteremia. EXAM: NON-TUNNELED CENTRAL VENOUS HEMODIALYSIS CATHETER PLACEMENT WITH ULTRASOUND AND FLUOROSCOPIC GUIDANCE COMPARISON:  None Available. MEDICATIONS: None CONTRAST:  5 mL Omnipaque 300 IV FLUOROSCOPY TIME:  Fluoroscopic dose; 3 mGy COMPLICATIONS: None immediate. PROCEDURE: Informed written consent was obtained from the patient and/or patient's representative after a discussion of the risks, benefits, and alternatives to treatment. Questions regarding the procedure were encouraged and answered. The RIGHT neck and chest were  prepped with chlorhexidine in a sterile fashion, and a sterile drape was applied covering the operative field. Maximum barrier sterile technique with sterile gowns and gloves were used for the procedure. A timeout was performed prior to the initiation of the procedure. After the overlying soft tissues were anesthetized, a small venotomy incision was created and a  micropuncture kit was utilized to access the internal jugular vein. Real-time ultrasound guidance was utilized for vascular access including the acquisition of a permanent ultrasound image documenting patency of the accessed vessel. The microwire was utilized to measure appropriate catheter length. A stiff glidewire was advanced to the level of the IVC. Under fluoroscopic guidance, the venotomy was serially dilated, ultimately allowing placement of a 16 cm temporary Trialysis catheter with tip ultimately terminating within the superior aspect of the right atrium. Final catheter positioning was confirmed and documented with a spot radiographic image. The catheter aspirates and flushes normally. The catheter was flushed with appropriate volume heparin dwells. The catheter exit site was secured with a 0-Prolene retention suture. A dressing was placed. The patient tolerated the procedure well without immediate post procedural complication. IMPRESSION: Successful placement of a RIGHT internal jugular approach 16 cm non tunneled/temporary dialysis catheter The tip of the catheter is positioned within the proximal RIGHT atrium. The catheter is ready for immediate use. PLAN: This catheter may be converted to a tunneled dialysis catheter at a later date as indicated. Michaelle Birks, MD [facility] Electronically Signed   By: Michaelle Birks M.D.   On: 10/20/2022 18:56   DG Abd 1 View  Result Date: 10/16/2022 CLINICAL DATA:  End-stage renal disease. Assess peritoneal dialysis catheter placement. EXAM: ABDOMEN - 1 VIEW COMPARISON:  CT earlier today. FINDINGS: Peritoneal dialysis catheter enters the left pelvis, courses into the right pelvis and is looped in the right pelvis. No discontinuity or kink. Midline skin staples. Air-fluid levels throughout bowel loops in the central abdomen. Enteric sutures in the left abdomen. The small foci of free air on CT are not well  delineated on the current exam IMPRESSION: 1. Peritoneal dialysis catheter looped in the right pelvis. No discontinuity or kink. 2. Air-fluid levels throughout bowel loops in the central abdomen, may represent ileus. Electronically Signed   By: Keith Rake M.D.   On: 10/16/2022 18:41   CT ABDOMEN PELVIS WO CONTRAST  Result Date: 10/16/2022 CLINICAL DATA:  Acute abdominal pain EXAM: CT ABDOMEN AND PELVIS WITHOUT CONTRAST TECHNIQUE: Multidetector CT imaging of the abdomen and pelvis was performed following the standard protocol without IV contrast. RADIATION DOSE REDUCTION: This exam was performed according to the departmental dose-optimization program which includes automated exposure control, adjustment of the mA and/or kV according to patient size and/or use of iterative reconstruction technique. COMPARISON:  None Available. FINDINGS: Lower Chest: Small pleural effusions Hepatobiliary: Normal hepatic contours. No intra- or extrahepatic biliary dilatation. Small volume perihepatic ascites. Normal gallbladder. Pancreas: Normal pancreas. No ductal dilatation or peripancreatic fluid collection. Spleen: Normal. Adrenals/Urinary Tract: The adrenal glands are normal. Bilateral renal atrophy. There is a left paramedian approach peritoneal dialysis catheter that is coiled in the right lower quadrant. Intermediate volume free fluid in the pelvis. There are a few expected punctate foci of intra-abdominal free air. Stomach/Bowel: Hiatal hernia with distal esophageal thickening. No small bowel dilatation or inflammation. No focal colonic abnormality. Vascular/Lymphatic: There is calcific atherosclerosis of the abdominal aorta. No lymphadenopathy. Reproductive: Fibroid uterus Other: None. Musculoskeletal: No bony spinal canal stenosis or focal osseous  abnormality. IMPRESSION: 1. No acute abnormality of the abdomen or pelvis. 2. Peritoneal dialysis catheter coiled in the right lower quadrant with intermediate volume  free fluid in the pelvis. 3. Small pleural effusions. 4. Hiatal hernia with distal esophageal thickening, which may indicate esophagitis. Correlation with non-emergent upper endoscopy recommended to exclude neoplasm. Aortic atherosclerosis (ICD10-I70.0). Electronically Signed   By: Ulyses Jarred M.D.   On: 10/16/2022 01:15     TODAY-DAY OF DISCHARGE:  Subjective:   Sheena Smith today has no headache,no chest abdominal pain,no new weakness tingling or numbness, feels much better wants to go home today.   Objective:   Blood pressure 129/77, pulse 88, temperature 98.1 F (36.7 C), temperature source Oral, resp. rate (!) 21, height _0  (1.575 m), weight 44.8 kg, SpO2 99 %.  Intake/Output Summary (Last 24 hours) at 11/06/2022 0946 Last data filed at 11/06/2022 0600 Gross per 24 hour  Intake 120 ml  Output 1500 ml  Net -1380 ml   Filed Weights   11/05/22 0546 11/06/22 0150 11/06/22 0540  Weight: 48 kg 46.2 kg 44.8 kg    Exam: Awake Alert, Oriented *3, No new F.N deficits, Normal affect [place].AT,PERRAL Supple Neck,No JVD, No cervical lymphadenopathy appriciated.  Symmetrical Chest wall movement, Good air movement bilaterally, CTAB RRR,No Gallops,Rubs or new Murmurs, No Parasternal Heave +ve B.Sounds, Abd Soft, Non tender, No organomegaly appriciated, No rebound -guarding or rigidity. No Cyanosis, Clubbing or edema, No new Rash or bruise   PERTINENT RADIOLOGIC STUDIES: No results found.   PERTINENT LAB RESULTS: CBC: Recent Labs    11/05/22 0425 11/06/22 0201  WBC 8.1 8.2  HGB 9.1* 8.3*  HCT 29.0* 28.8*  PLT 357 394   CMET CMP     Component Value Date/Time   NA 127 (L) 11/06/2022 0201   NA 137 05/21/2020 0905   K 4.0 11/06/2022 0201   CL 90 (L) 11/06/2022 0201   CO2 26 11/06/2022 0201   GLUCOSE 91 11/06/2022 0201   BUN 33 (H) 11/06/2022 0201   BUN 57 (H) 05/21/2020 0905   CREATININE 6.73 (H) 11/06/2022 0201   CALCIUM 7.9 (L) 11/06/2022 0201   PROT 6.8 10/16/2022 0031    PROT 6.2 05/21/2020 0905   ALBUMIN 1.8 (L) 11/06/2022 0201   ALBUMIN 3.2 (L) 05/21/2020 0905   AST 15 10/16/2022 0031   ALT 10 10/16/2022 0031   ALKPHOS 48 10/16/2022 0031   BILITOT 2.3 (H) 10/16/2022 0031   BILITOT 0.4 05/21/2020 0905   GFRNONAA 6 (L) 11/06/2022 0201   GFRAA 5 (L) 05/21/2020 0905    GFR Estimated Creatinine Clearance: 5.6 mL/min (A) (by C-G formula based on SCr of 6.73 mg/dL (H)). No results for input(s): "LIPASE", "AMYLASE" in the last 72 hours. No results for input(s): "CKTOTAL", "CKMB", "CKMBINDEX", "TROPONINI" in the last 72 hours. Invalid input(s): "POCBNP" No results for input(s): "DDIMER" in the last 72 hours. No results for input(s): "HGBA1C" in the last 72 hours. No results for input(s): "CHOL", "HDL", "LDLCALC", "TRIG", "CHOLHDL", "LDLDIRECT" in the last 72 hours. No results for input(s): "TSH", "T4TOTAL", "T3FREE", "THYROIDAB" in the last 72 hours.  Invalid input(s): "FREET3" No results for input(s): "VITAMINB12", "FOLATE", "FERRITIN", "TIBC", "IRON", "RETICCTPCT" in the last 72 hours. Coags: No results for input(s): "INR" in the last 72 hours.  Invalid input(s): "PT" Microbiology: No results found for this or any previous visit (from the past 240 hour(s)).  FURTHER DISCHARGE INSTRUCTIONS:  Get Medicines reviewed and adjusted: Please take all your medications with  you for your next visit with your Primary MD  Laboratory/radiological data: Please request your Primary MD to go over all hospital tests and procedure/radiological results at the follow up, please ask your Primary MD to get all Hospital records sent to his/her office.  In some cases, they will be blood work, cultures and biopsy results pending at the time of your discharge. Please request that your primary care M.D. goes through all the records of your hospital data and follows up on these results.  Also Note the following: If you experience worsening of your admission symptoms,  develop shortness of breath, life threatening emergency, suicidal or homicidal thoughts you must seek medical attention immediately by calling 911 or calling your MD immediately  if symptoms less severe.  You must read complete instructions/literature along with all the possible adverse reactions/side effects for all the Medicines you take and that have been prescribed to you. Take any new Medicines after you have completely understood and accpet all the possible adverse reactions/side effects.   Do not drive when taking Pain medications or sleeping medications (Benzodaizepines)  Do not take more than prescribed Pain, Sleep and Anxiety Medications. It is not advisable to combine anxiety,sleep and pain medications without talking with your primary care practitioner  Special Instructions: If you have smoked or chewed Tobacco  in the last 2 yrs please stop smoking, stop any regular Alcohol  and or any Recreational drug use.  Wear Seat belts while driving.  Please note: You were cared for by a hospitalist during your hospital stay. Once you are discharged, your primary care physician will handle any further medical issues. Please note that NO REFILLS for any discharge medications will be authorized once you are discharged, as it is imperative that you return to your primary care physician (or establish a relationship with a primary care physician if you do not have one) for your post hospital discharge needs so that they can reassess your need for medications and monitor your lab values.  Total Time spent coordinating discharge including counseling, education and face to face time equals greater than 30 minutes.  SignedOren Binet 11/06/2022 9:46 AM

## 2022-11-06 NOTE — Progress Notes (Signed)
   11/06/22 0536  Vitals  Temp 98.1 F (36.7 C)  Temp Source Oral  BP 129/77  MAP (mmHg) 82  BP Location Left Arm  BP Method Automatic  Patient Position (if appropriate) Lying  Pulse Rate 88  Pulse Rate Source Monitor  ECG Heart Rate 87  Resp (!) 21  Oxygen Therapy  SpO2 99 %  O2 Device Room Air  During Treatment Monitoring  Intra-Hemodialysis Comments Tx completed  Post Treatment  Dialyzer Clearance Lightly streaked  Duration of HD Treatment -hour(s) 3.5 hour(s)  Liters Processed 83.9  Fluid Removed (mL) 1500 mL (1.5 liters)  Tolerated HD Treatment Yes  Post-Hemodialysis Comments tx complete, pt stable, UF paused briefly due to nausea and hypotensive episode while eating, resolved with pausing of UF, goal lowered and UF resumed

## 2022-11-06 NOTE — TOC Progression Note (Signed)
Transition of Care [facility]) - Initial/Assessment Note    Patient Details  Name: Sheena Smith MRN: [id number] Date of Birth: Apr 05, 1951  Transition of [facility]) CM/SW Contact:    Milinda Antis, [place] Phone Number: 11/06/2022, 8:52 AM  Clinical Narrative:                 Insurance authorization for SNF approved.  MD notified.  LCSW waiting for confirmation from facility including the number to call report and room number.  TOC following.  Expected Discharge Plan: Skilled Nursing Facility Barriers to Discharge: Insurance Authorization, SNF Pending bed offer   Patient Goals and CMS Choice Patient states their goals for this hospitalization and ongoing recovery are:: To go to rehab to regain strength CMS Medicare.gov Compare Post Acute Care list provided to:: Patient Choice offered to / list presented to : Patient, Adult Children      Expected Discharge Plan and Services       Living arrangements for the past 2 months: Single Family Home                                      Prior Living Arrangements/Services Living arrangements for the past 2 months: Single Family Home Lives with:: Self Patient language and need for interpreter reviewed:: Yes Do you feel safe going back to the place where you live?: Yes      Need for Family Participation in Patient Care: Yes (Comment) Care giver support system in place?: Yes (comment)   Criminal Activity/Legal Involvement Pertinent to Current Situation/Hospitalization: No - Comment as needed  Activities of Daily Living Home Assistive Devices/Equipment: Shower chair without back ADL Screening (condition at time of admission) Patient's cognitive ability adequate to safely complete daily activities?: Yes Is the patient deaf or have difficulty hearing?: No Does the patient have difficulty seeing, even when wearing glasses/contacts?: No Does the patient have difficulty concentrating, remembering, or making decisions?: No Patient  able to express need for assistance with ADLs?: Yes Does the patient have difficulty dressing or bathing?: No Independently performs ADLs?: Yes (appropriate for developmental age) Does the patient have difficulty walking or climbing stairs?: No Weakness of Legs: None Weakness of Arms/Hands: None  Permission Sought/Granted Permission sought to share information with : Facility Art therapist granted to share information with : Yes, Verbal Permission Granted  Share Information with NAME: daughters  Permission granted to share info w AGENCY: SNFs        Emotional Assessment Appearance:: Appears older than stated age Attitude/Demeanor/Rapport: Engaged Affect (typically observed): Accepting Orientation: : Oriented to Situation, Oriented to  Time, Oriented to Place, Oriented to Self Alcohol / Substance Use: Not Applicable Psych Involvement: No (comment)  Admission diagnosis:  Acute peritonitis ([place]) [K65.0] Peritonitis ([place]) [K65.9] Patient Active Problem List   Diagnosis Date Noted   Peritonitis ([place]) 10/17/2022   Acute peritonitis ([place]) 10/16/2022   Sepsis ([place]) 10/16/2022   ESRD on peritoneal dialysis ([place]) 36/64/4034   Metabolic acidosis with increased anion gap and accumulation of organic acids 10/16/2022   Anemia due to chronic kidney disease 10/16/2022   Essential hypertension 10/16/2022   Lupus ([place]) 10/16/2022   GERD (gastroesophageal reflux disease) 10/16/2022   PCP:  [name], MD Pharmacy:   [id number] [id number]: [phone]: [phone] [id number] [address] [place] [address] Phone: [phone] Fax: [phone]     Social Determinants of Health (SDOH) Social History: SDOH Screenings   Food Insecurity: No Food Insecurity (10/31/2022)  Housing: Low Risk  (10/31/2022)   Transportation Needs: No Transportation Needs (10/31/2022)  Utilities: Not At Risk (10/31/2022)  Tobacco Use: Medium Risk (10/31/2022)   SDOH Interventions: Transportation Interventions: Intervention Not Indicated, Inpatient TOC, Patient Resources (Friends/Family)   Readmission Risk Interventions     No data to display

## 2022-11-07 DIAGNOSIS — N186 End stage renal disease: Secondary | ICD-10-CM | POA: Diagnosis not present

## 2022-11-07 DIAGNOSIS — G4700 Insomnia, unspecified: Secondary | ICD-10-CM | POA: Diagnosis not present

## 2022-11-07 DIAGNOSIS — M3214 Glomerular disease in systemic lupus erythematosus: Secondary | ICD-10-CM | POA: Diagnosis not present

## 2022-11-07 DIAGNOSIS — I10 Essential (primary) hypertension: Secondary | ICD-10-CM | POA: Diagnosis not present

## 2022-11-07 DIAGNOSIS — Z992 Dependence on renal dialysis: Secondary | ICD-10-CM | POA: Diagnosis not present

## 2022-11-07 DIAGNOSIS — Z7189 Other specified counseling: Secondary | ICD-10-CM | POA: Diagnosis not present

## 2022-11-07 DIAGNOSIS — L853 Xerosis cutis: Secondary | ICD-10-CM | POA: Diagnosis not present

## 2022-11-07 DIAGNOSIS — K5900 Constipation, unspecified: Secondary | ICD-10-CM | POA: Diagnosis not present

## 2022-11-07 DIAGNOSIS — K219 Gastro-esophageal reflux disease without esophagitis: Secondary | ICD-10-CM | POA: Diagnosis not present

## 2022-11-08 DIAGNOSIS — Z992 Dependence on renal dialysis: Secondary | ICD-10-CM | POA: Diagnosis not present

## 2022-11-08 DIAGNOSIS — N186 End stage renal disease: Secondary | ICD-10-CM | POA: Diagnosis not present

## 2022-11-09 DIAGNOSIS — R5381 Other malaise: Secondary | ICD-10-CM | POA: Diagnosis not present

## 2022-11-11 DIAGNOSIS — I10 Essential (primary) hypertension: Secondary | ICD-10-CM | POA: Diagnosis not present

## 2022-11-11 DIAGNOSIS — N186 End stage renal disease: Secondary | ICD-10-CM | POA: Diagnosis not present

## 2022-11-11 DIAGNOSIS — Z992 Dependence on renal dialysis: Secondary | ICD-10-CM | POA: Diagnosis not present

## 2022-11-11 DIAGNOSIS — M329 Systemic lupus erythematosus, unspecified: Secondary | ICD-10-CM | POA: Diagnosis not present

## 2022-11-11 DIAGNOSIS — K659 Peritonitis, unspecified: Secondary | ICD-10-CM | POA: Diagnosis not present

## 2022-11-13 DIAGNOSIS — N186 End stage renal disease: Secondary | ICD-10-CM | POA: Diagnosis not present

## 2022-11-13 DIAGNOSIS — Z992 Dependence on renal dialysis: Secondary | ICD-10-CM | POA: Diagnosis not present

## 2022-11-15 DIAGNOSIS — N186 End stage renal disease: Secondary | ICD-10-CM | POA: Diagnosis not present

## 2022-11-15 DIAGNOSIS — Z992 Dependence on renal dialysis: Secondary | ICD-10-CM | POA: Diagnosis not present

## 2022-11-17 DIAGNOSIS — N186 End stage renal disease: Secondary | ICD-10-CM | POA: Diagnosis not present

## 2022-11-17 DIAGNOSIS — M3214 Glomerular disease in systemic lupus erythematosus: Secondary | ICD-10-CM | POA: Diagnosis not present

## 2022-11-17 DIAGNOSIS — I10 Essential (primary) hypertension: Secondary | ICD-10-CM | POA: Diagnosis not present

## 2022-11-17 DIAGNOSIS — K219 Gastro-esophageal reflux disease without esophagitis: Secondary | ICD-10-CM | POA: Diagnosis not present

## 2022-11-17 DIAGNOSIS — Z992 Dependence on renal dialysis: Secondary | ICD-10-CM | POA: Diagnosis not present

## 2022-11-17 DIAGNOSIS — G4700 Insomnia, unspecified: Secondary | ICD-10-CM | POA: Diagnosis not present

## 2022-11-17 DIAGNOSIS — K5900 Constipation, unspecified: Secondary | ICD-10-CM | POA: Diagnosis not present

## 2022-11-17 DIAGNOSIS — Z7189 Other specified counseling: Secondary | ICD-10-CM | POA: Diagnosis not present

## 2022-11-17 DIAGNOSIS — L853 Xerosis cutis: Secondary | ICD-10-CM | POA: Diagnosis not present

## 2022-11-18 DIAGNOSIS — Z992 Dependence on renal dialysis: Secondary | ICD-10-CM | POA: Diagnosis not present

## 2022-11-18 DIAGNOSIS — N186 End stage renal disease: Secondary | ICD-10-CM | POA: Diagnosis not present

## 2022-11-20 DIAGNOSIS — E1122 Type 2 diabetes mellitus with diabetic chronic kidney disease: Secondary | ICD-10-CM | POA: Diagnosis not present

## 2022-11-20 DIAGNOSIS — N186 End stage renal disease: Secondary | ICD-10-CM | POA: Diagnosis not present

## 2022-11-20 DIAGNOSIS — Z992 Dependence on renal dialysis: Secondary | ICD-10-CM | POA: Diagnosis not present

## 2022-11-21 DIAGNOSIS — D631 Anemia in chronic kidney disease: Secondary | ICD-10-CM | POA: Diagnosis not present

## 2022-11-21 DIAGNOSIS — G9341 Metabolic encephalopathy: Secondary | ICD-10-CM | POA: Diagnosis not present

## 2022-11-21 DIAGNOSIS — I129 Hypertensive chronic kidney disease with stage 1 through stage 4 chronic kidney disease, or unspecified chronic kidney disease: Secondary | ICD-10-CM | POA: Diagnosis not present

## 2022-11-21 DIAGNOSIS — Z9181 History of falling: Secondary | ICD-10-CM | POA: Diagnosis not present

## 2022-11-21 DIAGNOSIS — K659 Peritonitis, unspecified: Secondary | ICD-10-CM | POA: Diagnosis not present

## 2022-11-21 DIAGNOSIS — M3214 Glomerular disease in systemic lupus erythematosus: Secondary | ICD-10-CM | POA: Diagnosis not present

## 2022-11-21 DIAGNOSIS — N186 End stage renal disease: Secondary | ICD-10-CM | POA: Diagnosis not present

## 2022-11-21 DIAGNOSIS — Z992 Dependence on renal dialysis: Secondary | ICD-10-CM | POA: Diagnosis not present

## 2022-11-22 DIAGNOSIS — N186 End stage renal disease: Secondary | ICD-10-CM | POA: Diagnosis not present

## 2022-11-22 DIAGNOSIS — Z992 Dependence on renal dialysis: Secondary | ICD-10-CM | POA: Diagnosis not present

## 2022-11-24 DIAGNOSIS — Z992 Dependence on renal dialysis: Secondary | ICD-10-CM | POA: Diagnosis not present

## 2022-11-24 DIAGNOSIS — E440 Moderate protein-calorie malnutrition: Secondary | ICD-10-CM | POA: Diagnosis not present

## 2022-11-24 DIAGNOSIS — N186 End stage renal disease: Secondary | ICD-10-CM | POA: Diagnosis not present

## 2022-11-24 DIAGNOSIS — I151 Hypertension secondary to other renal disorders: Secondary | ICD-10-CM | POA: Diagnosis not present

## 2022-11-24 DIAGNOSIS — K5909 Other constipation: Secondary | ICD-10-CM | POA: Diagnosis not present

## 2022-11-24 DIAGNOSIS — M3214 Glomerular disease in systemic lupus erythematosus: Secondary | ICD-10-CM | POA: Diagnosis not present

## 2022-11-25 DIAGNOSIS — Z992 Dependence on renal dialysis: Secondary | ICD-10-CM | POA: Diagnosis not present

## 2022-11-25 DIAGNOSIS — N186 End stage renal disease: Secondary | ICD-10-CM | POA: Diagnosis not present

## 2022-11-27 DIAGNOSIS — N186 End stage renal disease: Secondary | ICD-10-CM | POA: Diagnosis not present

## 2022-11-27 DIAGNOSIS — Z992 Dependence on renal dialysis: Secondary | ICD-10-CM | POA: Diagnosis not present

## 2022-11-29 DIAGNOSIS — Z992 Dependence on renal dialysis: Secondary | ICD-10-CM | POA: Diagnosis not present

## 2022-11-29 DIAGNOSIS — N186 End stage renal disease: Secondary | ICD-10-CM | POA: Diagnosis not present

## 2022-12-02 DIAGNOSIS — N186 End stage renal disease: Secondary | ICD-10-CM | POA: Diagnosis not present

## 2022-12-02 DIAGNOSIS — Z992 Dependence on renal dialysis: Secondary | ICD-10-CM | POA: Diagnosis not present

## 2022-12-03 DIAGNOSIS — Z992 Dependence on renal dialysis: Secondary | ICD-10-CM | POA: Diagnosis not present

## 2022-12-03 DIAGNOSIS — N186 End stage renal disease: Secondary | ICD-10-CM | POA: Diagnosis not present

## 2022-12-04 DIAGNOSIS — Z992 Dependence on renal dialysis: Secondary | ICD-10-CM | POA: Diagnosis not present

## 2022-12-04 DIAGNOSIS — N186 End stage renal disease: Secondary | ICD-10-CM | POA: Diagnosis not present

## 2022-12-06 DIAGNOSIS — Z992 Dependence on renal dialysis: Secondary | ICD-10-CM | POA: Diagnosis not present

## 2022-12-06 DIAGNOSIS — N186 End stage renal disease: Secondary | ICD-10-CM | POA: Diagnosis not present

## 2022-12-09 DIAGNOSIS — Z992 Dependence on renal dialysis: Secondary | ICD-10-CM | POA: Diagnosis not present

## 2022-12-09 DIAGNOSIS — N186 End stage renal disease: Secondary | ICD-10-CM | POA: Diagnosis not present

## 2022-12-11 DIAGNOSIS — Z992 Dependence on renal dialysis: Secondary | ICD-10-CM | POA: Diagnosis not present

## 2022-12-11 DIAGNOSIS — N186 End stage renal disease: Secondary | ICD-10-CM | POA: Diagnosis not present

## 2022-12-13 DIAGNOSIS — Z992 Dependence on renal dialysis: Secondary | ICD-10-CM | POA: Diagnosis not present

## 2022-12-13 DIAGNOSIS — N186 End stage renal disease: Secondary | ICD-10-CM | POA: Diagnosis not present

## 2022-12-16 DIAGNOSIS — Z992 Dependence on renal dialysis: Secondary | ICD-10-CM | POA: Diagnosis not present

## 2022-12-16 DIAGNOSIS — N186 End stage renal disease: Secondary | ICD-10-CM | POA: Diagnosis not present

## 2022-12-18 DIAGNOSIS — Z992 Dependence on renal dialysis: Secondary | ICD-10-CM | POA: Diagnosis not present

## 2022-12-18 DIAGNOSIS — N186 End stage renal disease: Secondary | ICD-10-CM | POA: Diagnosis not present

## 2022-12-20 DIAGNOSIS — N186 End stage renal disease: Secondary | ICD-10-CM | POA: Diagnosis not present

## 2022-12-20 DIAGNOSIS — Z992 Dependence on renal dialysis: Secondary | ICD-10-CM | POA: Diagnosis not present

## 2022-12-23 DIAGNOSIS — Z992 Dependence on renal dialysis: Secondary | ICD-10-CM | POA: Diagnosis not present

## 2022-12-23 DIAGNOSIS — N186 End stage renal disease: Secondary | ICD-10-CM | POA: Diagnosis not present

## 2022-12-25 DIAGNOSIS — N186 End stage renal disease: Secondary | ICD-10-CM | POA: Diagnosis not present

## 2022-12-25 DIAGNOSIS — Z992 Dependence on renal dialysis: Secondary | ICD-10-CM | POA: Diagnosis not present

## 2022-12-27 DIAGNOSIS — N186 End stage renal disease: Secondary | ICD-10-CM | POA: Diagnosis not present

## 2022-12-27 DIAGNOSIS — Z992 Dependence on renal dialysis: Secondary | ICD-10-CM | POA: Diagnosis not present

## 2022-12-30 DIAGNOSIS — N186 End stage renal disease: Secondary | ICD-10-CM | POA: Diagnosis not present

## 2022-12-30 DIAGNOSIS — Z992 Dependence on renal dialysis: Secondary | ICD-10-CM | POA: Diagnosis not present

## 2023-01-01 DIAGNOSIS — N186 End stage renal disease: Secondary | ICD-10-CM | POA: Diagnosis not present

## 2023-01-01 DIAGNOSIS — Z992 Dependence on renal dialysis: Secondary | ICD-10-CM | POA: Diagnosis not present

## 2023-01-02 DIAGNOSIS — N186 End stage renal disease: Secondary | ICD-10-CM | POA: Diagnosis not present

## 2023-01-02 DIAGNOSIS — K219 Gastro-esophageal reflux disease without esophagitis: Secondary | ICD-10-CM | POA: Diagnosis not present

## 2023-01-02 DIAGNOSIS — I10 Essential (primary) hypertension: Secondary | ICD-10-CM | POA: Diagnosis not present

## 2023-01-03 DIAGNOSIS — Z992 Dependence on renal dialysis: Secondary | ICD-10-CM | POA: Diagnosis not present

## 2023-01-03 DIAGNOSIS — N186 End stage renal disease: Secondary | ICD-10-CM | POA: Diagnosis not present

## 2023-01-06 DIAGNOSIS — N186 End stage renal disease: Secondary | ICD-10-CM | POA: Diagnosis not present

## 2023-01-06 DIAGNOSIS — Z992 Dependence on renal dialysis: Secondary | ICD-10-CM | POA: Diagnosis not present

## 2023-01-08 DIAGNOSIS — N186 End stage renal disease: Secondary | ICD-10-CM | POA: Diagnosis not present

## 2023-01-08 DIAGNOSIS — Z992 Dependence on renal dialysis: Secondary | ICD-10-CM | POA: Diagnosis not present

## 2023-01-10 DIAGNOSIS — N186 End stage renal disease: Secondary | ICD-10-CM | POA: Diagnosis not present

## 2023-01-10 DIAGNOSIS — Z992 Dependence on renal dialysis: Secondary | ICD-10-CM | POA: Diagnosis not present

## 2023-01-13 DIAGNOSIS — N186 End stage renal disease: Secondary | ICD-10-CM | POA: Diagnosis not present

## 2023-01-13 DIAGNOSIS — Z992 Dependence on renal dialysis: Secondary | ICD-10-CM | POA: Diagnosis not present

## 2023-01-15 DIAGNOSIS — N186 End stage renal disease: Secondary | ICD-10-CM | POA: Diagnosis not present

## 2023-01-15 DIAGNOSIS — Z992 Dependence on renal dialysis: Secondary | ICD-10-CM | POA: Diagnosis not present

## 2023-01-17 DIAGNOSIS — Z992 Dependence on renal dialysis: Secondary | ICD-10-CM | POA: Diagnosis not present

## 2023-01-17 DIAGNOSIS — N186 End stage renal disease: Secondary | ICD-10-CM | POA: Diagnosis not present

## 2023-01-20 DIAGNOSIS — N186 End stage renal disease: Secondary | ICD-10-CM | POA: Diagnosis not present

## 2023-01-20 DIAGNOSIS — Z992 Dependence on renal dialysis: Secondary | ICD-10-CM | POA: Diagnosis not present

## 2023-01-22 DIAGNOSIS — Z992 Dependence on renal dialysis: Secondary | ICD-10-CM | POA: Diagnosis not present

## 2023-01-22 DIAGNOSIS — N186 End stage renal disease: Secondary | ICD-10-CM | POA: Diagnosis not present

## 2023-01-23 DIAGNOSIS — I120 Hypertensive chronic kidney disease with stage 5 chronic kidney disease or end stage renal disease: Secondary | ICD-10-CM | POA: Diagnosis not present

## 2023-01-23 DIAGNOSIS — N186 End stage renal disease: Secondary | ICD-10-CM | POA: Diagnosis not present

## 2023-01-23 DIAGNOSIS — Z992 Dependence on renal dialysis: Secondary | ICD-10-CM | POA: Diagnosis not present

## 2023-01-23 DIAGNOSIS — I130 Hypertensive heart and chronic kidney disease with heart failure and stage 1 through stage 4 chronic kidney disease, or unspecified chronic kidney disease: Secondary | ICD-10-CM | POA: Diagnosis not present

## 2023-01-23 DIAGNOSIS — K219 Gastro-esophageal reflux disease without esophagitis: Secondary | ICD-10-CM | POA: Diagnosis not present

## 2023-01-23 DIAGNOSIS — K660 Peritoneal adhesions (postprocedural) (postinfection): Secondary | ICD-10-CM | POA: Diagnosis not present

## 2023-01-27 DIAGNOSIS — Z992 Dependence on renal dialysis: Secondary | ICD-10-CM | POA: Diagnosis not present

## 2023-01-27 DIAGNOSIS — N186 End stage renal disease: Secondary | ICD-10-CM | POA: Diagnosis not present

## 2023-01-29 DIAGNOSIS — N186 End stage renal disease: Secondary | ICD-10-CM | POA: Diagnosis not present

## 2023-01-29 DIAGNOSIS — Z992 Dependence on renal dialysis: Secondary | ICD-10-CM | POA: Diagnosis not present

## 2023-01-31 DIAGNOSIS — Z992 Dependence on renal dialysis: Secondary | ICD-10-CM | POA: Diagnosis not present

## 2023-01-31 DIAGNOSIS — N186 End stage renal disease: Secondary | ICD-10-CM | POA: Diagnosis not present

## 2023-02-01 DIAGNOSIS — Z992 Dependence on renal dialysis: Secondary | ICD-10-CM | POA: Diagnosis not present

## 2023-02-01 DIAGNOSIS — N186 End stage renal disease: Secondary | ICD-10-CM | POA: Diagnosis not present

## 2023-02-03 DIAGNOSIS — N186 End stage renal disease: Secondary | ICD-10-CM | POA: Diagnosis not present

## 2023-02-03 DIAGNOSIS — Z992 Dependence on renal dialysis: Secondary | ICD-10-CM | POA: Diagnosis not present

## 2023-02-05 DIAGNOSIS — N186 End stage renal disease: Secondary | ICD-10-CM | POA: Diagnosis not present

## 2023-02-05 DIAGNOSIS — Z992 Dependence on renal dialysis: Secondary | ICD-10-CM | POA: Diagnosis not present

## 2023-02-07 DIAGNOSIS — Z992 Dependence on renal dialysis: Secondary | ICD-10-CM | POA: Diagnosis not present

## 2023-02-07 DIAGNOSIS — N186 End stage renal disease: Secondary | ICD-10-CM | POA: Diagnosis not present

## 2023-02-10 DIAGNOSIS — Z992 Dependence on renal dialysis: Secondary | ICD-10-CM | POA: Diagnosis not present

## 2023-02-10 DIAGNOSIS — N186 End stage renal disease: Secondary | ICD-10-CM | POA: Diagnosis not present

## 2023-02-12 DIAGNOSIS — N186 End stage renal disease: Secondary | ICD-10-CM | POA: Diagnosis not present

## 2023-02-12 DIAGNOSIS — Z992 Dependence on renal dialysis: Secondary | ICD-10-CM | POA: Diagnosis not present

## 2023-02-13 DIAGNOSIS — M3214 Glomerular disease in systemic lupus erythematosus: Secondary | ICD-10-CM | POA: Diagnosis not present

## 2023-02-13 DIAGNOSIS — I151 Hypertension secondary to other renal disorders: Secondary | ICD-10-CM | POA: Diagnosis not present

## 2023-02-13 DIAGNOSIS — N186 End stage renal disease: Secondary | ICD-10-CM | POA: Diagnosis not present

## 2023-02-14 DIAGNOSIS — Z992 Dependence on renal dialysis: Secondary | ICD-10-CM | POA: Diagnosis not present

## 2023-02-14 DIAGNOSIS — N186 End stage renal disease: Secondary | ICD-10-CM | POA: Diagnosis not present

## 2023-02-17 DIAGNOSIS — N186 End stage renal disease: Secondary | ICD-10-CM | POA: Diagnosis not present

## 2023-02-17 DIAGNOSIS — Z992 Dependence on renal dialysis: Secondary | ICD-10-CM | POA: Diagnosis not present

## 2023-02-19 DIAGNOSIS — N186 End stage renal disease: Secondary | ICD-10-CM | POA: Diagnosis not present

## 2023-02-19 DIAGNOSIS — Z992 Dependence on renal dialysis: Secondary | ICD-10-CM | POA: Diagnosis not present

## 2023-02-21 DIAGNOSIS — Z992 Dependence on renal dialysis: Secondary | ICD-10-CM | POA: Diagnosis not present

## 2023-02-21 DIAGNOSIS — N186 End stage renal disease: Secondary | ICD-10-CM | POA: Diagnosis not present

## 2023-02-24 DIAGNOSIS — N186 End stage renal disease: Secondary | ICD-10-CM | POA: Diagnosis not present

## 2023-02-24 DIAGNOSIS — Z992 Dependence on renal dialysis: Secondary | ICD-10-CM | POA: Diagnosis not present

## 2023-02-26 DIAGNOSIS — Z992 Dependence on renal dialysis: Secondary | ICD-10-CM | POA: Diagnosis not present

## 2023-02-26 DIAGNOSIS — N186 End stage renal disease: Secondary | ICD-10-CM | POA: Diagnosis not present

## 2023-02-27 DIAGNOSIS — M3210 Systemic lupus erythematosus, organ or system involvement unspecified: Secondary | ICD-10-CM | POA: Diagnosis not present

## 2023-02-27 DIAGNOSIS — H25013 Cortical age-related cataract, bilateral: Secondary | ICD-10-CM | POA: Diagnosis not present

## 2023-02-27 DIAGNOSIS — H35363 Drusen (degenerative) of macula, bilateral: Secondary | ICD-10-CM | POA: Diagnosis not present

## 2023-02-27 DIAGNOSIS — H5203 Hypermetropia, bilateral: Secondary | ICD-10-CM | POA: Diagnosis not present

## 2023-02-27 DIAGNOSIS — H524 Presbyopia: Secondary | ICD-10-CM | POA: Diagnosis not present

## 2023-02-27 DIAGNOSIS — Z79899 Other long term (current) drug therapy: Secondary | ICD-10-CM | POA: Diagnosis not present

## 2023-02-27 DIAGNOSIS — H52222 Regular astigmatism, left eye: Secondary | ICD-10-CM | POA: Diagnosis not present

## 2023-02-28 DIAGNOSIS — N186 End stage renal disease: Secondary | ICD-10-CM | POA: Diagnosis not present

## 2023-02-28 DIAGNOSIS — Z992 Dependence on renal dialysis: Secondary | ICD-10-CM | POA: Diagnosis not present

## 2023-03-03 ENCOUNTER — Encounter (HOSPITAL_BASED_OUTPATIENT_CLINIC_OR_DEPARTMENT_OTHER): Payer: Self-pay

## 2023-03-03 ENCOUNTER — Emergency Department (HOSPITAL_BASED_OUTPATIENT_CLINIC_OR_DEPARTMENT_OTHER)
Admission: EM | Admit: 2023-03-03 | Discharge: 2023-03-03 | Disposition: A | Discharge status: Home / Self Care | Payer: 59 | Attending: Emergency Medicine | Admitting: Emergency Medicine

## 2023-03-03 ENCOUNTER — Other Ambulatory Visit: Payer: Self-pay

## 2023-03-03 DIAGNOSIS — I120 Hypertensive chronic kidney disease with stage 5 chronic kidney disease or end stage renal disease: Secondary | ICD-10-CM | POA: Diagnosis not present

## 2023-03-03 DIAGNOSIS — I1A0 Resistant hypertension: Secondary | ICD-10-CM

## 2023-03-03 DIAGNOSIS — N186 End stage renal disease: Secondary | ICD-10-CM | POA: Diagnosis not present

## 2023-03-03 DIAGNOSIS — Z992 Dependence on renal dialysis: Secondary | ICD-10-CM | POA: Diagnosis not present

## 2023-03-03 DIAGNOSIS — Z79899 Other long term (current) drug therapy: Secondary | ICD-10-CM | POA: Diagnosis not present

## 2023-03-03 DIAGNOSIS — I10 Essential (primary) hypertension: Secondary | ICD-10-CM | POA: Diagnosis not present

## 2023-03-03 NOTE — Discharge Instructions (Signed)
You were seen in the emergency department for your high blood pressure.  You had no signs of heart attack, stroke or other complications from her blood pressure being high and your blood pressure did start to improve in the emergency department.  You should continue to take your home blood pressure medications as prescribed.  You can keep a log of your blood pressures and I recommend taking it at the same time once every day when you have been resting for at least 15 minutes.  You can bring this log to your doctors to determine what medication changes you may need.  You should return to the emergency department if you are having severe chest pain, numbness or weakness on one side of the body compared to the other or any other new or concerning symptoms.

## 2023-03-03 NOTE — ED Triage Notes (Signed)
Pt reports elevated BP  Full dialysis session today, states that she took BP meds (propanolol, amlodipine) at 3p, irbesartan after, "maybe at 4." BP "has been going up since." Pt denies associated symptoms, reports "something almost trying to be HA," no visual disturbances/nausea/ weakness, etc.

## 2023-03-03 NOTE — ED Provider Notes (Signed)
[place] EMERGENCY DEPARTMENT AT [facility] Provider Note   CSN: [id number] Arrival date & time: 03/03/23  1913     History  Chief Complaint  Patient presents with   Hypertension         Sheena Smith is a 72 y.o. female.  Patient is a 72 year old female with a past medical history of hypertension, ESRD on TTS HD, lupus presenting to the emergency department with high blood pressure.  Patient states that she woke up this morning feeling off and she checked her blood pressure and it was in the 200s systolic.  She states that she went to her dialysis and they continue to monitor her blood pressure during her dialysis session.  She states that her blood pressure initially was dropping and she was recommended to hold off on her noontime blood pressure medication.  She states that after dialysis she checked her blood pressure at home and it was back in the 200s.  She states that she called her primary doctor who recommended taking her afternoon and evening blood pressure medications and when she rechecked her blood pressure in an hour was unchanged so they recommended that she come to the emergency department.  She denied any associated headache or chest pain, numbness or weakness.  She states that she did have her blood pressure medication increased a few weeks ago.  The history is provided by the patient.  Hypertension       Home Medications Prior to Admission medications   Medication Sig Start Date End Date Taking? Authorizing Provider  amLODipine (NORVASC) 10 MG tablet Take 1 tablet by mouth daily. 11/07/19 10/16/22  [provider]  calcium acetate (PHOSLO) 667 MG capsule Take 1 capsule (667 mg total) by mouth 3 (three) times daily with meals. 11/06/22   Ghimire, Werner Lean, MD  ferrous sulfate 324 MG TBEC Take 324 mg by mouth daily with breakfast.    [provider]  gentamicin cream (GARAMYCIN) 0.1 % Apply 1 Application topically daily. 11/06/22   Ghimire,  Werner Lean, MD  hydroxychloroquine (PLAQUENIL) 200 MG tablet Take 200 mg by mouth every other day.    [provider]  irbesartan (AVAPRO) 300 MG tablet Take 300 mg by mouth every evening.    [provider]  melatonin 5 MG TABS Take 1 tablet (5 mg total) by mouth at bedtime. 11/06/22   Ghimire, Werner Lean, MD  multivitamin (RENA-VIT) TABS tablet Take 1 tablet by mouth at bedtime. 11/06/22   Ghimire, Werner Lean, MD  pantoprazole (PROTONIX) 40 MG tablet Take 40 mg by mouth daily.    [provider]  propranolol (INDERAL) 20 MG tablet Take 40 mg by mouth 4 (four) times daily. 02/14/19 10/16/22  [provider]  spironolactone (ALDACTONE) 25 MG tablet Take 25 mg by mouth daily.    [provider]      Allergies    Nsaids, Other, Atenolol, and Elemental sulfur    Review of Systems   Review of Systems  Physical Exam Updated Vital Signs BP (!) 182/90   Pulse 70   Temp 98.8 F (37.1 C)   Resp (!) 24   SpO2 100%  Physical Exam Vitals and nursing note reviewed.  Constitutional:      General: She is not in acute distress.    Appearance: Normal appearance.  HENT:     Head: Normocephalic and atraumatic.     Nose: Nose normal.     Mouth/Throat:     Mouth:  Mucous membranes are moist.     Pharynx: Oropharynx is clear.  Eyes:     Extraocular Movements: Extraocular movements intact.     Conjunctiva/sclera: Conjunctivae normal.     Pupils: Pupils are equal, round, and reactive to light.  Cardiovascular:     Rate and Rhythm: Normal rate and regular rhythm.     Heart sounds: Normal heart sounds.  Pulmonary:     Effort: Pulmonary effort is normal.     Breath sounds: Normal breath sounds.  Abdominal:     General: Abdomen is flat.     Palpations: Abdomen is soft.     Tenderness: There is no abdominal tenderness.  Musculoskeletal:        General: Normal range of motion.     Cervical back: Normal range of motion and neck supple.     Right lower leg: No  edema.     Left lower leg: No edema.  Skin:    General: Skin is warm and dry.  Neurological:     General: No focal deficit present.     Mental Status: She is alert and oriented to person, place, and time.     Cranial Nerves: No cranial nerve deficit.     Sensory: No sensory deficit.     Motor: No weakness.     Coordination: Coordination normal.  Psychiatric:        Mood and Affect: Mood normal.        Behavior: Behavior normal.     ED Results / Procedures / Treatments   Labs (all labs ordered are listed, but only abnormal results are displayed) Labs Reviewed - No data to display  EKG EKG Interpretation  Date/Time:  Tuesday March 03 2023 19:30:34 EDT Ventricular Rate:  79 PR Interval:  172 QRS Duration: 76 QT Interval:  408 QTC Calculation: 467 R Axis:   54 Text Interpretation: Normal sinus rhythm Cannot rule out Anterior infarct , age undetermined Abnormal ECG No significant change since last tracing Confirmed by Elayne Snare ([id number]) on 03/03/2023 8:30:35 PM  Radiology No results found.  Procedures Procedures    Medications Ordered in ED Medications - No data to display  ED Course/ Medical Decision Making/ A&P                             Medical Decision Making This patient presents to the ED with chief complaint(s) of hypertension with pertinent past medical history of hypertension, ESRD, lupus which further complicates the presenting complaint. The complaint involves an extensive differential diagnosis and also carries with it a high risk of complications and morbidity.    The differential diagnosis includes hypertensive urgency, no evidence of hypertensive emergency on exam, asymptomatic hypertension  Additional history obtained: Additional history obtained from family Records reviewed Care Everywhere/External Records  ED Course and Reassessment: On patient's arrival she is well-appearing in no acute distress.  Initial blood pressure was in the 210s  systolic.  She was due for her evening propranolol which she took and her blood pressure spontaneously improved prior to her propranolol taking effect into the 180s systolic.  The patient had EKG performed on arrival that showed no acute ischemic changes.  She has no neurologic deficits on exam and is asymptomatic at this time.  Patient is stable for discharge home with primary care or nephrology follow-up.  She states that she will see her nephrologist on Thursday.  She was recommended to keep a log of  her blood pressures and was given strict return precautions.  Independent labs interpretation:  N/A  Independent visualization of imaging: - N/A  Consultation: - Consulted or discussed management/test interpretation w/ external professional: N/A  Consideration for admission or further workup: Patient has no emergent conditions requiring admission or further work-up at this time and is stable for discharge home with primary care follow-up  Social Determinants of health: N/A            Final Clinical Impression(s) / ED Diagnoses Final diagnoses:  Resistant hypertension    Rx / DC Orders ED Discharge Orders     None         Rexford Maus, DO 03/03/23 2118

## 2023-03-03 NOTE — ED Notes (Signed)
Pt taking home propanolol dose that she is overdue for per Theresia Lo, MD.

## 2023-03-05 DIAGNOSIS — N186 End stage renal disease: Secondary | ICD-10-CM | POA: Diagnosis not present

## 2023-03-05 DIAGNOSIS — Z992 Dependence on renal dialysis: Secondary | ICD-10-CM | POA: Diagnosis not present

## 2023-03-07 ENCOUNTER — Encounter (HOSPITAL_BASED_OUTPATIENT_CLINIC_OR_DEPARTMENT_OTHER): Payer: Self-pay | Admitting: Emergency Medicine

## 2023-03-07 ENCOUNTER — Emergency Department (HOSPITAL_BASED_OUTPATIENT_CLINIC_OR_DEPARTMENT_OTHER): Payer: 59

## 2023-03-07 ENCOUNTER — Emergency Department (HOSPITAL_BASED_OUTPATIENT_CLINIC_OR_DEPARTMENT_OTHER)
Admission: EM | Admit: 2023-03-07 | Discharge: 2023-03-07 | Disposition: A | Discharge status: Home / Self Care | Payer: 59 | Attending: Emergency Medicine | Admitting: Emergency Medicine

## 2023-03-07 DIAGNOSIS — I1A0 Resistant hypertension: Secondary | ICD-10-CM

## 2023-03-07 DIAGNOSIS — Z992 Dependence on renal dialysis: Secondary | ICD-10-CM | POA: Diagnosis not present

## 2023-03-07 DIAGNOSIS — I10 Essential (primary) hypertension: Secondary | ICD-10-CM | POA: Diagnosis not present

## 2023-03-07 DIAGNOSIS — N186 End stage renal disease: Secondary | ICD-10-CM | POA: Diagnosis not present

## 2023-03-07 DIAGNOSIS — R002 Palpitations: Secondary | ICD-10-CM | POA: Diagnosis present

## 2023-03-07 LAB — TROPONIN I (HIGH SENSITIVITY)
Troponin I (High Sensitivity): 21 ng/L — ABNORMAL HIGH (ref <18)
Troponin I (High Sensitivity): 20 ng/L — ABNORMAL HIGH (ref <18)

## 2023-03-07 LAB — BASIC METABOLIC PANEL
Anion gap: 18 — ABNORMAL HIGH (ref 5–15)
BUN: 53 mg/dL — ABNORMAL HIGH (ref 8–23)
CO2: 23 mmol/L (ref 22–32)
Calcium: 8.5 mg/dL — ABNORMAL LOW (ref 8.9–10.3)
Chloride: 91 mmol/L — ABNORMAL LOW (ref 98–111)
Creatinine, Ser: 6.9 mg/dL — ABNORMAL HIGH (ref 0.44–1.00)
GFR, Estimated: 6 mL/min — ABNORMAL LOW (ref >60)
Glucose, Bld: 93 mg/dL (ref 70–99)
Potassium: 4 mmol/L (ref 3.5–5.1)
Sodium: 132 mmol/L — ABNORMAL LOW (ref 135–145)

## 2023-03-07 LAB — CBC
HCT: 41.5 % (ref 36.0–46.0)
Hemoglobin: 13 g/dL (ref 12.0–15.0)
MCH: 24.3 pg — ABNORMAL LOW (ref 26.0–34.0)
MCHC: 31.3 g/dL (ref 30.0–36.0)
MCV: 77.4 fL — ABNORMAL LOW (ref 80.0–100.0)
Platelets: 218 10*3/uL (ref 150–400)
RBC: 5.36 MIL/uL — ABNORMAL HIGH (ref 3.87–5.11)
RDW: 22.1 % — ABNORMAL HIGH (ref 11.5–15.5)
WBC: 6.8 10*3/uL (ref 4.0–10.5)
nRBC: 0 % (ref 0.0–0.2)

## 2023-03-07 NOTE — ED Provider Notes (Signed)
[place] EMERGENCY DEPARTMENT AT [facility] Provider Note   CSN: [id number] Arrival date & time: 03/07/23  0011     History  Chief Complaint  Patient presents with   Hypertension   Palpitations    Sheena Smith is a 72 y.o. female.  HPI     This is a 72 year old female who presents with concerns for palpitations.  Patient reports that recently her blood pressure has been difficult to manage.  She is on dialysis and last dialyzed on Thursday.  She states that she was seen and evaluated earlier this week for similar concerns and was noted to have high blood pressure.  This downtrended without intervention in the emergency department.  She followed up with her primary doctor and hydralazine was added yesterday.  She is only taken 2 doses of this.  She states tonight she was watching TV when she noted palpitations.  No chest pain or shortness of breath.  She was concerned she may have a potassium derangement.  States that she previously has had some adjustments in her potassium.  Denies headaches or strokelike symptoms.  Home Medications Prior to Admission medications   Medication Sig Start Date End Date Taking? Authorizing Provider  amLODipine (NORVASC) 10 MG tablet Take 1 tablet by mouth daily. 11/07/19 10/16/22  [provider]  calcium acetate (PHOSLO) 667 MG capsule Take 1 capsule (667 mg total) by mouth 3 (three) times daily with meals. 11/06/22   Ghimire, Werner Lean, MD  ferrous sulfate 324 MG TBEC Take 324 mg by mouth daily with breakfast.    [provider]  gentamicin cream (GARAMYCIN) 0.1 % Apply 1 Application topically daily. 11/06/22   Ghimire, Werner Lean, MD  hydroxychloroquine (PLAQUENIL) 200 MG tablet Take 200 mg by mouth every other day.    [provider]  irbesartan (AVAPRO) 300 MG tablet Take 300 mg by mouth every evening.    [provider]  melatonin 5 MG TABS Take 1 tablet (5 mg total) by mouth at bedtime. 11/06/22   Ghimire,  Werner Lean, MD  multivitamin (RENA-VIT) TABS tablet Take 1 tablet by mouth at bedtime. 11/06/22   Ghimire, Werner Lean, MD  pantoprazole (PROTONIX) 40 MG tablet Take 40 mg by mouth daily.    [provider]  propranolol (INDERAL) 20 MG tablet Take 40 mg by mouth 4 (four) times daily. 02/14/19 10/16/22  [provider]  spironolactone (ALDACTONE) 25 MG tablet Take 25 mg by mouth daily.    [provider]      Allergies    Nsaids, Other, Atenolol, and Elemental sulfur    Review of Systems   Review of Systems  Constitutional:  Negative for fever.  Respiratory:  Negative for shortness of breath.   Cardiovascular:  Positive for palpitations. Negative for chest pain.  Gastrointestinal:  Negative for abdominal pain, nausea and vomiting.  All other systems reviewed and are negative.   Physical Exam Updated Vital Signs BP (!) 176/101   Pulse 72   Temp 98.6 F (37 C) (Oral)   Resp 13   SpO2 94%  Physical Exam Vitals and nursing note reviewed.  Constitutional:      Appearance: She is well-developed. She is not ill-appearing.  HENT:     Head: Normocephalic and atraumatic.  Eyes:     Pupils: Pupils are equal, round, and reactive to light.  Cardiovascular:     Rate and Rhythm: Normal rate and regular rhythm.     Comments: Dialysis catheter right  upper chest Pulmonary:     Effort: Pulmonary effort is normal. No respiratory distress.     Breath sounds: No wheezing.  Abdominal:     General: Bowel sounds are normal.     Palpations: Abdomen is soft.     Tenderness: There is no guarding or rebound.  Musculoskeletal:     Cervical back: Neck supple.  Skin:    General: Skin is warm and dry.  Neurological:     Mental Status: She is alert and oriented to person, place, and time.  Psychiatric:        Mood and Affect: Mood normal.     ED Results / Procedures / Treatments   Labs (all labs ordered are listed, but only abnormal results are displayed) Labs Reviewed   BASIC METABOLIC PANEL - Abnormal; Notable for the following components:      Result Value   Sodium 132 (*)    Chloride 91 (*)    BUN 53 (*)    Creatinine, Ser 6.90 (*)    Calcium 8.5 (*)    GFR, Estimated 6 (*)    Anion gap 18 (*)    All other components within normal limits  CBC - Abnormal; Notable for the following components:   RBC 5.36 (*)    MCV 77.4 (*)    MCH 24.3 (*)    RDW 22.1 (*)    All other components within normal limits  TROPONIN I (HIGH SENSITIVITY) - Abnormal; Notable for the following components:   Troponin I (High Sensitivity) 21 (*)    All other components within normal limits  TROPONIN I (HIGH SENSITIVITY) - Abnormal; Notable for the following components:   Troponin I (High Sensitivity) 20 (*)    All other components within normal limits    EKG None  Radiology DG Chest Portable 1 View  Result Date: 03/07/2023 CLINICAL DATA:  palpitations EXAM: PORTABLE CHEST 1 VIEW COMPARISON:  IR fluro 10/28/22 FINDINGS: Right chest wall dialysis catheter. The heart and mediastinal contours are within normal limits. Atherosclerotic plaque. No focal consolidation. No pulmonary edema. No pleural effusion. No pneumothorax. No acute osseous abnormality. IMPRESSION: No active disease. Aortic Atherosclerosis (ICD10-I70.0). Electronically Signed   By: Tish Frederickson M.D.   On: 03/07/2023 01:52    Procedures Procedures    Medications Ordered in ED Medications - No data to display  ED Course/ Medical Decision Making/ A&P                             Medical Decision Making Amount and/or Complexity of Data Reviewed Labs: ordered. Radiology: ordered.   This patient presents to the ED for concern of high blood pressure, palpitations, this involves an extensive number of treatment options, and is a complaint that carries with it a high risk of complications and morbidity.  I considered the following differential and admission for this acute, potentially life threatening  condition.  The differential diagnosis includes hypertensive urgency, hypertensive emergency, arrhythmia, metabolic derangement, ACS  MDM:    This is a 72 year old female with a history of end-stage renal disease who presents with concerns for high blood pressure.  Was seen and evaluated for the same earlier this week.  She had hydralazine added to her regiment and started that yesterday but has noted continued high blood pressure readings at home.  She experienced some palpitations while at home tonight and this concerned her.  She is not having any active chest pain or  shortness of breath.  She does not appear volume overloaded.  Initially blood pressure 201/112.  This downtrended to 170s over 100.  EKG has no ischemic changes.  Lab work is largely at the patient's baseline.  No hypokalemia or hyperkalemia.  No other significant metabolic derangements.  Troponin x 2 flat in the low 20s which is likely related to the patient's kidney dysfunction and not ACS.  Overall workup reassuring.  No signs or symptoms of hypertensive urgency or emergency.  I have instructed the patient to continue her hydralazine as prescribed.  Follow-up with PCP on Monday if continued high readings at home.  (Labs, imaging, consults)  Labs: I Ordered, and personally interpreted labs.  The pertinent results include: CBC, BMP, troponin x 2  Imaging Studies ordered: I ordered imaging studies including chest x-ray I independently visualized and interpreted imaging. I agree with the radiologist interpretation  Additional history obtained from chart review.  External records from outside source obtained and reviewed including prior evaluations  Cardiac Monitoring: The patient was maintained on a cardiac monitor.  If on the cardiac monitor, I personally viewed and interpreted the cardiac monitored which showed an underlying rhythm of: Sinus rhythm  Reevaluation: After the interventions noted above, I reevaluated the patient  and found that they have :stayed the same  Social Determinants of Health:  lives independently, dialysis patient  Disposition: Discharge  Co morbidities that complicate the patient evaluation  Past Medical History:  Diagnosis Date   Hypertension    Lupus (HCC)    Renal failure      Medicines No orders of the defined types were placed in this encounter.   I have reviewed the patients home medicines and have made adjustments as needed  Problem List / ED Course: Problem List Items Addressed This Visit   None Visit Diagnoses     Resistant hypertension    -  Primary                   Final Clinical Impression(s) / ED Diagnoses Final diagnoses:  Resistant hypertension    Rx / DC Orders ED Discharge Orders     None         Shon Baton, MD 03/07/23 [phone]

## 2023-03-07 NOTE — ED Triage Notes (Signed)
Seen for HTN on 03/03/2023,   Reports continued increase BP.  New tonight having palpitations,  No chest pains, sob

## 2023-03-07 NOTE — ED Notes (Signed)
Pt. Took her own 40mg  propranolol after it was Ok'd by Dr.Horton

## 2023-03-07 NOTE — Discharge Instructions (Addendum)
You are seen today for ongoing and high blood pressure.  Your lab work is reassuring.  Continue your hydralazine along with your other blood pressure medications through the weekend.  Follow-up with your primary doctor on Monday if you note that your readings are still high.

## 2023-03-10 DIAGNOSIS — N186 End stage renal disease: Secondary | ICD-10-CM | POA: Diagnosis not present

## 2023-03-10 DIAGNOSIS — Z992 Dependence on renal dialysis: Secondary | ICD-10-CM | POA: Diagnosis not present

## 2023-03-11 DIAGNOSIS — M3214 Glomerular disease in systemic lupus erythematosus: Secondary | ICD-10-CM | POA: Diagnosis not present

## 2023-03-11 DIAGNOSIS — K219 Gastro-esophageal reflux disease without esophagitis: Secondary | ICD-10-CM | POA: Diagnosis not present

## 2023-03-11 DIAGNOSIS — I151 Hypertension secondary to other renal disorders: Secondary | ICD-10-CM | POA: Diagnosis not present

## 2023-03-11 DIAGNOSIS — N186 End stage renal disease: Secondary | ICD-10-CM | POA: Diagnosis not present

## 2023-03-12 DIAGNOSIS — N186 End stage renal disease: Secondary | ICD-10-CM | POA: Diagnosis not present

## 2023-03-12 DIAGNOSIS — Z992 Dependence on renal dialysis: Secondary | ICD-10-CM | POA: Diagnosis not present

## 2023-03-14 DIAGNOSIS — N186 End stage renal disease: Secondary | ICD-10-CM | POA: Diagnosis not present

## 2023-03-14 DIAGNOSIS — Z992 Dependence on renal dialysis: Secondary | ICD-10-CM | POA: Diagnosis not present

## 2023-03-16 DIAGNOSIS — Z7289 Other problems related to lifestyle: Secondary | ICD-10-CM | POA: Diagnosis not present

## 2023-03-16 DIAGNOSIS — N186 End stage renal disease: Secondary | ICD-10-CM | POA: Diagnosis not present

## 2023-03-16 DIAGNOSIS — R9431 Abnormal electrocardiogram [ECG] [EKG]: Secondary | ICD-10-CM | POA: Diagnosis not present

## 2023-03-16 DIAGNOSIS — I120 Hypertensive chronic kidney disease with stage 5 chronic kidney disease or end stage renal disease: Secondary | ICD-10-CM | POA: Diagnosis not present

## 2023-03-16 DIAGNOSIS — Z992 Dependence on renal dialysis: Secondary | ICD-10-CM | POA: Diagnosis not present

## 2023-03-16 DIAGNOSIS — Z01812 Encounter for preprocedural laboratory examination: Secondary | ICD-10-CM | POA: Diagnosis not present

## 2023-03-16 DIAGNOSIS — Z01818 Encounter for other preprocedural examination: Secondary | ICD-10-CM | POA: Diagnosis not present

## 2023-03-16 DIAGNOSIS — M329 Systemic lupus erythematosus, unspecified: Secondary | ICD-10-CM | POA: Diagnosis not present

## 2023-03-16 DIAGNOSIS — Z01810 Encounter for preprocedural cardiovascular examination: Secondary | ICD-10-CM | POA: Diagnosis not present

## 2023-03-17 DIAGNOSIS — N186 End stage renal disease: Secondary | ICD-10-CM | POA: Diagnosis not present

## 2023-03-17 DIAGNOSIS — Z992 Dependence on renal dialysis: Secondary | ICD-10-CM | POA: Diagnosis not present

## 2023-03-19 DIAGNOSIS — N186 End stage renal disease: Secondary | ICD-10-CM | POA: Diagnosis not present

## 2023-03-19 DIAGNOSIS — Z992 Dependence on renal dialysis: Secondary | ICD-10-CM | POA: Diagnosis not present

## 2023-03-21 DIAGNOSIS — N186 End stage renal disease: Secondary | ICD-10-CM | POA: Diagnosis not present

## 2023-03-21 DIAGNOSIS — Z992 Dependence on renal dialysis: Secondary | ICD-10-CM | POA: Diagnosis not present

## 2023-03-24 DIAGNOSIS — Z992 Dependence on renal dialysis: Secondary | ICD-10-CM | POA: Diagnosis not present

## 2023-03-24 DIAGNOSIS — N186 End stage renal disease: Secondary | ICD-10-CM | POA: Diagnosis not present

## 2023-03-25 ENCOUNTER — Other Ambulatory Visit: Payer: Self-pay

## 2023-03-25 ENCOUNTER — Encounter (HOSPITAL_BASED_OUTPATIENT_CLINIC_OR_DEPARTMENT_OTHER): Payer: Self-pay | Admitting: Emergency Medicine

## 2023-03-25 ENCOUNTER — Emergency Department (HOSPITAL_BASED_OUTPATIENT_CLINIC_OR_DEPARTMENT_OTHER)
Admission: EM | Admit: 2023-03-25 | Discharge: 2023-03-25 | Disposition: A | Discharge status: Home / Self Care | Payer: 59 | Attending: Emergency Medicine | Admitting: Emergency Medicine

## 2023-03-25 DIAGNOSIS — D631 Anemia in chronic kidney disease: Secondary | ICD-10-CM | POA: Diagnosis not present

## 2023-03-25 DIAGNOSIS — Z79899 Other long term (current) drug therapy: Secondary | ICD-10-CM | POA: Insufficient documentation

## 2023-03-25 DIAGNOSIS — N186 End stage renal disease: Secondary | ICD-10-CM

## 2023-03-25 DIAGNOSIS — I120 Hypertensive chronic kidney disease with stage 5 chronic kidney disease or end stage renal disease: Secondary | ICD-10-CM | POA: Insufficient documentation

## 2023-03-25 DIAGNOSIS — R110 Nausea: Secondary | ICD-10-CM | POA: Insufficient documentation

## 2023-03-25 DIAGNOSIS — Z992 Dependence on renal dialysis: Secondary | ICD-10-CM | POA: Diagnosis not present

## 2023-03-25 DIAGNOSIS — D649 Anemia, unspecified: Secondary | ICD-10-CM

## 2023-03-25 DIAGNOSIS — R112 Nausea with vomiting, unspecified: Secondary | ICD-10-CM | POA: Diagnosis not present

## 2023-03-25 DIAGNOSIS — R030 Elevated blood-pressure reading, without diagnosis of hypertension: Secondary | ICD-10-CM

## 2023-03-25 LAB — BASIC METABOLIC PANEL
Anion gap: 15 (ref 5–15)
BUN: 52 mg/dL — ABNORMAL HIGH (ref 8–23)
CO2: 26 mmol/L (ref 22–32)
Calcium: 9.3 mg/dL (ref 8.9–10.3)
Chloride: 90 mmol/L — ABNORMAL LOW (ref 98–111)
Creatinine, Ser: 6.26 mg/dL — ABNORMAL HIGH (ref 0.44–1.00)
GFR, Estimated: 7 mL/min — ABNORMAL LOW (ref >60)
Glucose, Bld: 112 mg/dL — ABNORMAL HIGH (ref 70–99)
Potassium: 3.9 mmol/L (ref 3.5–5.1)
Sodium: 131 mmol/L — ABNORMAL LOW (ref 135–145)

## 2023-03-25 LAB — CBC WITH DIFFERENTIAL/PLATELET
Abs Immature Granulocytes: 0.02 10*3/uL (ref 0.00–0.07)
Basophils Absolute: 0.1 10*3/uL (ref 0.0–0.1)
Basophils Relative: 1 %
Eosinophils Absolute: 0.2 10*3/uL (ref 0.0–0.5)
Eosinophils Relative: 3 %
HCT: 33.8 % — ABNORMAL LOW (ref 36.0–46.0)
Hemoglobin: 10.5 g/dL — ABNORMAL LOW (ref 12.0–15.0)
Immature Granulocytes: 0 %
Lymphocytes Relative: 15 %
Lymphs Abs: 1 10*3/uL (ref 0.7–4.0)
MCH: 24.1 pg — ABNORMAL LOW (ref 26.0–34.0)
MCHC: 31.1 g/dL (ref 30.0–36.0)
MCV: 77.7 fL — ABNORMAL LOW (ref 80.0–100.0)
Monocytes Absolute: 0.7 10*3/uL (ref 0.1–1.0)
Monocytes Relative: 10 %
Neutro Abs: 4.6 10*3/uL (ref 1.7–7.7)
Neutrophils Relative %: 71 %
Platelets: 232 10*3/uL (ref 150–400)
RBC: 4.35 MIL/uL (ref 3.87–5.11)
RDW: 22.2 % — ABNORMAL HIGH (ref 11.5–15.5)
WBC: 6.5 10*3/uL (ref 4.0–10.5)
nRBC: 0 % (ref 0.0–0.2)

## 2023-03-25 MED ORDER — ONDANSETRON 4 MG PO TBDP
4.0000 mg | ORAL_TABLET | Freq: Once | ORAL | Status: AC
  Administered 2023-03-25: 4 mg via ORAL
  Filled 2023-03-25: qty 1

## 2023-03-25 MED ORDER — ONDANSETRON 4 MG PO TBDP: 4.0000 mg | ORAL_TABLET | Freq: Three times a day (TID) | ORAL | 0 refills | Status: DC | PRN

## 2023-03-25 NOTE — ED Notes (Signed)
Pt requests to wait for labs until seen by provider

## 2023-03-25 NOTE — Discharge Instructions (Addendum)
Monitor your blood pressure, record readings and take to PCP for follow-up for medication adjustment consideration. Dialysis tomorrow as scheduled. Zofran as needed as directed for nausea.

## 2023-03-25 NOTE — ED Triage Notes (Signed)
Pt arrives pov, to triage in wheelchair with c/o HTN and n/v. Recent tx for same. PT denies HA. Pt also reports palpitations.Endorses 1mg  clonidine pta

## 2023-03-25 NOTE — ED Provider Notes (Signed)
[place] EMERGENCY DEPARTMENT AT [facility] Provider Note   CSN: [id number] Arrival date & time: 03/25/23  1637     History  Chief Complaint  Patient presents with   Hypertension    Sheena Smith is a 71 y.o. female.  72 year old female, hx ESRD (dialysis tues/thurs/sat, last full dialysis yesterday), felt nauseous today with BP 208/108. Took a clonidine PTA, BP did not improve so came to the ER. BP currently 175/84. Feels better, still a little nauseous. Patient makes very little urine, no changes. No changes in bowel habits, fevers, chills, abdominal pain, CP, SHOB. Has not missed BP meds lately, last took at 12:00pm today (propranolol, hydralazine, amlodipine). BP high for the past month, normally 150s/90s.       Home Medications Prior to Admission medications   Medication Sig Start Date End Date Taking? Authorizing Provider  ondansetron (ZOFRAN-ODT) 4 MG disintegrating tablet Take 1 tablet (4 mg total) by mouth every 8 (eight) hours as needed for nausea or vomiting. 03/25/23  Yes Jeannie Fend, PA-C  amLODipine (NORVASC) 10 MG tablet Take 1 tablet by mouth daily. 11/07/19 10/16/22  [provider]  calcium acetate (PHOSLO) 667 MG capsule Take 1 capsule (667 mg total) by mouth 3 (three) times daily with meals. 11/06/22   Ghimire, Werner Lean, MD  ferrous sulfate 324 MG TBEC Take 324 mg by mouth daily with breakfast.    [provider]  gentamicin cream (GARAMYCIN) 0.1 % Apply 1 Application topically daily. 11/06/22   Ghimire, Werner Lean, MD  hydroxychloroquine (PLAQUENIL) 200 MG tablet Take 200 mg by mouth every other day.    [provider]  irbesartan (AVAPRO) 300 MG tablet Take 300 mg by mouth every evening.    [provider]  melatonin 5 MG TABS Take 1 tablet (5 mg total) by mouth at bedtime. 11/06/22   Ghimire, Werner Lean, MD  multivitamin (RENA-VIT) TABS tablet Take 1 tablet by mouth at bedtime. 11/06/22   Ghimire, Werner Lean, MD   pantoprazole (PROTONIX) 40 MG tablet Take 40 mg by mouth daily.    [provider]  propranolol (INDERAL) 20 MG tablet Take 40 mg by mouth 4 (four) times daily. 02/14/19 10/16/22  [provider]  spironolactone (ALDACTONE) 25 MG tablet Take 25 mg by mouth daily.    [provider]      Allergies    Nsaids, Other, and Elemental sulfur    Review of Systems   Review of Systems Negative except as per HPI Physical Exam Updated Vital Signs BP (!) 174/84 (BP Location: Right Arm)   Pulse 73   Temp 98.3 F (36.8 C) (Oral)   Resp 18   Wt 46.7 kg   SpO2 97%   BMI 18.84 kg/m  Physical Exam Vitals and nursing note reviewed.  Constitutional:      General: She is not in acute distress.    Appearance: She is well-developed and underweight. She is not ill-appearing, toxic-appearing or diaphoretic.  HENT:     Head: Normocephalic and atraumatic.  Cardiovascular:     Rate and Rhythm: Normal rate and regular rhythm.     Heart sounds: Normal heart sounds.  Pulmonary:     Effort: Pulmonary effort is normal.     Breath sounds: Normal breath sounds.  Abdominal:     Palpations: Abdomen is soft.     Tenderness: There is no abdominal tenderness.  Musculoskeletal:     Right lower leg: No edema.  Left lower leg: No edema.  Skin:    General: Skin is warm and dry.     Findings: No erythema or rash.  Neurological:     Mental Status: She is alert and oriented to person, place, and time.  Psychiatric:        Behavior: Behavior normal.     ED Results / Procedures / Treatments   Labs (all labs ordered are listed, but only abnormal results are displayed) Labs Reviewed  CBC WITH DIFFERENTIAL/PLATELET - Abnormal; Notable for the following components:      Result Value   Hemoglobin 10.5 (*)    HCT 33.8 (*)    MCV 77.7 (*)    MCH 24.1 (*)    RDW 22.2 (*)    All other components within normal limits  BASIC METABOLIC PANEL - Abnormal; Notable for the following  components:   Sodium 131 (*)    Chloride 90 (*)    Glucose, Bld 112 (*)    BUN 52 (*)    Creatinine, Ser 6.26 (*)    GFR, Estimated 7 (*)    All other components within normal limits    EKG None  Radiology No results found.  Procedures Procedures    Medications Ordered in ED Medications  ondansetron (ZOFRAN-ODT) disintegrating tablet 4 mg (4 mg Oral Given 03/25/23 1807)    ED Course/ Medical Decision Making/ A&P                             Medical Decision Making Amount and/or Complexity of Data Reviewed Labs: ordered.  Risk Prescription drug management.   This patient presents to the ED for concern of elevated blood pressure, nausea, this involves an extensive number of treatment options, and is a complaint that carries with it a high risk of complications and morbidity.  The differential diagnosis includes but not limited to ESRD, metabolic derangement    Co morbidities that complicate the patient evaluation  ESRD on dialysis, anemia due to chronic kidney disease, hypertension, lupus, GERD   Additional history obtained:  External records from outside source obtained and reviewed including prior labs on file for comparison   Lab Tests:  I Ordered, and personally interpreted labs.  The pertinent results include: CBC with hemoglobin 10.5, slight decrease compared to prior on file.  BMP with no significant change compared to prior.  Cardiac Monitoring: / EKG:  The patient was maintained on a cardiac monitor.  I personally viewed and interpreted the cardiac monitored which showed an underlying rhythm of: Normal sinus rhythm, rate 77    Problem List / ED Course / Critical interventions / Medication management  72 year old female presents with complaint of feeling nauseous with elevated blood pressure.  Denies chest pain, shortness of breath, headaches, unilateral weakness or dizziness, lower extremity edema.  Dialysis patient, had full session yesterday, is  scheduled for tomorrow.  Labs reassuring, provided with Zofran for her nausea which has resolved, tolerating p.o.'s without difficulty.  Plan is for patient to monitor her blood pressure, record results and follow-up for recheck and medication splint as needed. I ordered medication including Zofran for nausea Reevaluation of the patient after these medicines showed that the patient resolved I have reviewed the patients home medicines and have made adjustments as needed   Social Determinants of Health:  Has PCP and specialty care   Test / Admission - Considered:  Stable for discharge, return to ER as needed, follow-up with  doctor tomorrow as scheduled.         Final Clinical Impression(s) / ED Diagnoses Final diagnoses:  Elevated blood pressure reading  ESRD (end stage renal disease) on dialysis (HCC)  Anemia, unspecified type  Nausea    Rx / DC Orders ED Discharge Orders          Ordered    ondansetron (ZOFRAN-ODT) 4 MG disintegrating tablet  Every 8 hours PRN        03/25/23 1850              Alden Hipp 03/25/23 Donnetta Hutching, MD 03/26/23 1640

## 2023-03-26 DIAGNOSIS — N186 End stage renal disease: Secondary | ICD-10-CM | POA: Diagnosis not present

## 2023-03-26 DIAGNOSIS — R8290 Unspecified abnormal findings in urine: Secondary | ICD-10-CM | POA: Diagnosis not present

## 2023-03-26 DIAGNOSIS — Z992 Dependence on renal dialysis: Secondary | ICD-10-CM | POA: Diagnosis not present

## 2023-03-28 DIAGNOSIS — N186 End stage renal disease: Secondary | ICD-10-CM | POA: Diagnosis not present

## 2023-03-28 DIAGNOSIS — Z992 Dependence on renal dialysis: Secondary | ICD-10-CM | POA: Diagnosis not present

## 2023-03-31 DIAGNOSIS — N186 End stage renal disease: Secondary | ICD-10-CM | POA: Diagnosis not present

## 2023-03-31 DIAGNOSIS — Z992 Dependence on renal dialysis: Secondary | ICD-10-CM | POA: Diagnosis not present

## 2023-04-02 ENCOUNTER — Other Ambulatory Visit (INDEPENDENT_AMBULATORY_CARE_PROVIDER_SITE_OTHER): Payer: Self-pay | Admitting: Vascular Surgery

## 2023-04-02 DIAGNOSIS — N186 End stage renal disease: Secondary | ICD-10-CM

## 2023-04-02 DIAGNOSIS — Z992 Dependence on renal dialysis: Secondary | ICD-10-CM | POA: Diagnosis not present

## 2023-04-03 DIAGNOSIS — N186 End stage renal disease: Secondary | ICD-10-CM | POA: Diagnosis not present

## 2023-04-03 DIAGNOSIS — Z992 Dependence on renal dialysis: Secondary | ICD-10-CM | POA: Diagnosis not present

## 2023-04-04 DIAGNOSIS — Z992 Dependence on renal dialysis: Secondary | ICD-10-CM | POA: Diagnosis not present

## 2023-04-04 DIAGNOSIS — N186 End stage renal disease: Secondary | ICD-10-CM | POA: Diagnosis not present

## 2023-04-05 NOTE — Progress Notes (Signed)
MRN : [id number]  Sheena Smith is a 72 y.o. (07-03-1951) female who presents with chief complaint of check access.  History of Present Illness:    The patient is seen for evaluation of dialysis access.  The patient has a history of multiple failed accesses.  Initially she was on peritoneal dialysis but due to peritonitis this has been discontinued and a tunneled catheter was placed  Current access is via a catheter which is functioning adequately.  There have not been multiple episodes of catheter infection.  The patient denies fever and chills while on dialysis.  No tenderness or drainage at the exit site.  The patient is followed by nephrology.    The patient is right-handed.  The patient has been considering the various methods of dialysis and wishes to proceed with hemodialysis and therefore creation of AV access.  No recent shortening of the patient's walking distance or new symptoms consistent with claudication.  No history of rest pain symptoms. No new ulcers or wounds of the lower extremities have occurred.  The patient denies amaurosis fugax or recent TIA symptoms. There are no recent neurological changes noted. There is no history of DVT, PE or superficial thrombophlebitis. No recent episodes of angina or shortness of breath documented.   Duplex ultrasound of the upper extremities for vein mapping demonstrates triphasic arterial signals with small veins smaller than 2 mm and therefore not adequate for fistula creation.  No outpatient medications have been marked as taking for the 04/06/23 encounter (Appointment) with Gilda Crease, Latina Craver, MD.    Past Medical History:  Diagnosis Date   Hypertension    Lupus (HCC)    Renal failure     Past Surgical History:  Procedure Laterality Date   FOOT SURGERY     IR FLUORO GUIDE CV LINE RIGHT  10/20/2022   IR FLUORO GUIDE CV LINE RIGHT  10/28/2022   IR US GUIDE VASC ACCESS RIGHT  10/20/2022   IR US GUIDE  VASC ACCESS RIGHT  10/30/2022   REMOVAL OF A DIALYSIS CATHETER N/A 10/23/2022   Procedure: REMOVAL OF A  PERITONEAL DIALYSIS CATHETER;  Surgeon: Andria Meuse, MD;  Location: [facility] OR;  Service: General;  Laterality: N/A;   SMALL INTESTINE SURGERY      Social History Social History   Tobacco Use   Smoking status: Former    Years: 2    Types: Cigarettes   Smokeless tobacco: Never  Substance Use Topics   Alcohol use: Not Currently   Drug use: Not Currently    Family History Family History  Problem Relation Age of Onset   Ovarian cancer Mother    Hypertension Father    Diabetes Father     Allergies  Allergen Reactions   Nsaids     "Kidney Disease. Exception to this NSAID intolerance is aspirin 81-325mg  daily"'   Other     Per pt, states that she was informed she was allergic to sulfa after allergy testing  "Kidney Disease. Exception to this NSAID intolerance is aspirin 81-325mg  daily"'   Elemental Sulfur      REVIEW OF SYSTEMS (Negative unless checked)  Constitutional: [] Weight loss  [] Fever  [] Chills Cardiac: [] Chest pain   [] Chest pressure   [] Palpitations   [] Shortness of breath when laying flat   [] Shortness of breath with exertion. Vascular:  [] Pain in legs with walking   [] Pain in  legs at rest  [] History of DVT   [] Phlebitis   [] Swelling in legs   [] Varicose veins   [] Non-healing ulcers Pulmonary:   [] Uses home oxygen   [] Productive cough   [] Hemoptysis   [] Wheeze  [] COPD   [] Asthma Neurologic:  [] Dizziness   [] Seizures   [] History of stroke   [] History of TIA  [] Aphasia   [] Vissual changes   [] Weakness or numbness in arm   [] Weakness or numbness in leg Musculoskeletal:   [] Joint swelling   [] Joint pain   [] Low back pain Hematologic:  [] Easy bruising  [] Easy bleeding   [] Hypercoagulable state   [] Anemic Gastrointestinal:  [] Diarrhea   [] Vomiting  [x] Gastroesophageal reflux/heartburn   [] Difficulty swallowing. Genitourinary:  [x] Chronic kidney disease    [] Difficult urination  [] Frequent urination   [] Blood in urine Skin:  [] Rashes   [] Ulcers  Psychological:  [] History of anxiety   []  History of major depression.  Physical Examination  There were no vitals filed for this visit. There is no height or weight on file to calculate BMI. Gen: WD/WN, NAD Head: [place]/AT, No temporalis wasting.  Ear/Nose/Throat: Hearing grossly intact, nares w/o erythema or drainage Eyes: PER, EOMI, sclera nonicteric.  Neck: Supple, no gross masses or lesions.  No JVD.  Pulmonary:  Good air movement, no audible wheezing, no use of accessory muscles.  Cardiac: RRR, precordium non-hyperdynamic. Vascular:   No visible superficial veins Vessel Right Left  Radial Palpable Palpable  Brachial Palpable Palpable  Gastrointestinal: soft, non-distended. No guarding/no peritoneal signs.  Musculoskeletal: M/S 5/5 throughout.  No deformity.  Neurologic: CN 2-12 intact. Pain and light touch intact in extremities.  Symmetrical.  Speech is fluent. Motor exam as listed above. Psychiatric: Judgment intact, Mood & affect appropriate for pt's clinical situation. Dermatologic: No rashes or ulcers noted.  No changes consistent with cellulitis.   CBC Lab Results  Component Value Date   WBC 6.5 03/25/2023   HGB 10.5 (L) 03/25/2023   HCT 33.8 (L) 03/25/2023   MCV 77.7 (L) 03/25/2023   PLT 232 03/25/2023    BMET    Component Value Date/Time   NA 131 (L) 03/25/2023 1806   NA 137 05/21/2020 0905   K 3.9 03/25/2023 1806   CL 90 (L) 03/25/2023 1806   CO2 26 03/25/2023 1806   GLUCOSE 112 (H) 03/25/2023 1806   BUN 52 (H) 03/25/2023 1806   BUN 57 (H) 05/21/2020 0905   CREATININE 6.26 (H) 03/25/2023 1806   CALCIUM 9.3 03/25/2023 1806   GFRNONAA 7 (L) 03/25/2023 1806   GFRAA 5 (L) 05/21/2020 0905   Estimated Creatinine Clearance: 6.3 mL/min (A) (by C-G formula based on SCr of 6.26 mg/dL (H)).  COAG Lab Results  Component Value Date   INR 1.3 (H) 10/16/2022     Radiology DG Chest Portable 1 View  Result Date: 03/07/2023 CLINICAL DATA:  palpitations EXAM: PORTABLE CHEST 1 VIEW COMPARISON:  IR fluro 10/28/22 FINDINGS: Right chest wall dialysis catheter. The heart and mediastinal contours are within normal limits. Atherosclerotic plaque. No focal consolidation. No pulmonary edema. No pleural effusion. No pneumothorax. No acute osseous abnormality. IMPRESSION: No active disease. Aortic Atherosclerosis (ICD10-I70.0). Electronically Signed   By: Tish Frederickson M.D.   On: 03/07/2023 01:52     Assessment/Plan 1. ESRD (HCC) Recommend:  At this time the patient does not have appropriate extremity access for dialysis  Patient should have a left arm brachial axillary AV graft created.  The risks, benefits and alternative therapies were reviewed in detail  with the patient.  All questions were answered.  The patient agrees to proceed with surgery.   The patient will follow up with me in the office after the surgery.  2. Essential hypertension Continue antihypertensive medications as already ordered, these medications have been reviewed and there are no changes at this time.  3. Gastroesophageal reflux disease with esophagitis without hemorrhage Continue PPI as already ordered, this medication has been reviewed and there are no changes at this time.  Avoidence of caffeine and alcohol  Moderate elevation of the head of the bed     Levora Dredge, MD  04/05/2023 3:25 PM

## 2023-04-06 ENCOUNTER — Ambulatory Visit (INDEPENDENT_AMBULATORY_CARE_PROVIDER_SITE_OTHER): Payer: 59

## 2023-04-06 ENCOUNTER — Ambulatory Visit (INDEPENDENT_AMBULATORY_CARE_PROVIDER_SITE_OTHER): Payer: 59 | Admitting: Vascular Surgery

## 2023-04-06 ENCOUNTER — Encounter (INDEPENDENT_AMBULATORY_CARE_PROVIDER_SITE_OTHER): Payer: Self-pay | Admitting: Vascular Surgery

## 2023-04-06 VITALS — BP 152/76 | HR 68 | Resp 15 | Wt 110.3 lb

## 2023-04-06 DIAGNOSIS — N186 End stage renal disease: Secondary | ICD-10-CM

## 2023-04-06 DIAGNOSIS — K2100 Gastro-esophageal reflux disease with esophagitis, without bleeding: Secondary | ICD-10-CM | POA: Diagnosis not present

## 2023-04-06 DIAGNOSIS — I10 Essential (primary) hypertension: Secondary | ICD-10-CM | POA: Diagnosis not present

## 2023-04-06 DIAGNOSIS — Z992 Dependence on renal dialysis: Secondary | ICD-10-CM | POA: Diagnosis not present

## 2023-04-07 DIAGNOSIS — Z992 Dependence on renal dialysis: Secondary | ICD-10-CM | POA: Diagnosis not present

## 2023-04-07 DIAGNOSIS — N186 End stage renal disease: Secondary | ICD-10-CM | POA: Diagnosis not present

## 2023-04-09 DIAGNOSIS — Z992 Dependence on renal dialysis: Secondary | ICD-10-CM | POA: Diagnosis not present

## 2023-04-09 DIAGNOSIS — N186 End stage renal disease: Secondary | ICD-10-CM | POA: Diagnosis not present

## 2023-04-12 ENCOUNTER — Encounter (INDEPENDENT_AMBULATORY_CARE_PROVIDER_SITE_OTHER): Payer: Self-pay | Admitting: Vascular Surgery

## 2023-04-12 DIAGNOSIS — Z992 Dependence on renal dialysis: Secondary | ICD-10-CM | POA: Diagnosis not present

## 2023-04-12 DIAGNOSIS — N186 End stage renal disease: Secondary | ICD-10-CM | POA: Diagnosis not present

## 2023-04-14 DIAGNOSIS — Z992 Dependence on renal dialysis: Secondary | ICD-10-CM | POA: Diagnosis not present

## 2023-04-14 DIAGNOSIS — N186 End stage renal disease: Secondary | ICD-10-CM | POA: Diagnosis not present

## 2023-04-16 DIAGNOSIS — N186 End stage renal disease: Secondary | ICD-10-CM | POA: Diagnosis not present

## 2023-04-16 DIAGNOSIS — Z992 Dependence on renal dialysis: Secondary | ICD-10-CM | POA: Diagnosis not present

## 2023-04-20 ENCOUNTER — Telehealth (INDEPENDENT_AMBULATORY_CARE_PROVIDER_SITE_OTHER): Payer: Self-pay

## 2023-04-20 NOTE — Telephone Encounter (Signed)
Leontine called about a prior authorization for a dialysis port placement. Her last appointment was 04/06/23 with Dr. Gilda Crease.  Please Advise

## 2023-04-21 DIAGNOSIS — Z992 Dependence on renal dialysis: Secondary | ICD-10-CM | POA: Diagnosis not present

## 2023-04-21 DIAGNOSIS — N186 End stage renal disease: Secondary | ICD-10-CM | POA: Diagnosis not present

## 2023-04-22 ENCOUNTER — Telehealth (INDEPENDENT_AMBULATORY_CARE_PROVIDER_SITE_OTHER): Payer: Self-pay

## 2023-04-22 NOTE — Telephone Encounter (Addendum)
Spoke with the patient and she is scheduled with Dr. Gilda Crease on 05/22/23 for a left brachial axillary graft at the [facility]. Pre op is on 05/11/23 at 1:00 pm at the [facility]. Pre-surgical instructions were discussed and will be sent to Mychart and mailed. Patient was offered 05/06/23 and declined.

## 2023-04-23 DIAGNOSIS — Z992 Dependence on renal dialysis: Secondary | ICD-10-CM | POA: Diagnosis not present

## 2023-04-23 DIAGNOSIS — N186 End stage renal disease: Secondary | ICD-10-CM | POA: Diagnosis not present

## 2023-04-25 DIAGNOSIS — N186 End stage renal disease: Secondary | ICD-10-CM | POA: Diagnosis not present

## 2023-04-25 DIAGNOSIS — Z992 Dependence on renal dialysis: Secondary | ICD-10-CM | POA: Diagnosis not present

## 2023-04-28 DIAGNOSIS — N186 End stage renal disease: Secondary | ICD-10-CM | POA: Diagnosis not present

## 2023-04-28 DIAGNOSIS — Z992 Dependence on renal dialysis: Secondary | ICD-10-CM | POA: Diagnosis not present

## 2023-04-30 DIAGNOSIS — N186 End stage renal disease: Secondary | ICD-10-CM | POA: Diagnosis not present

## 2023-04-30 DIAGNOSIS — Z992 Dependence on renal dialysis: Secondary | ICD-10-CM | POA: Diagnosis not present

## 2023-05-02 DIAGNOSIS — N186 End stage renal disease: Secondary | ICD-10-CM | POA: Diagnosis not present

## 2023-05-02 DIAGNOSIS — Z992 Dependence on renal dialysis: Secondary | ICD-10-CM | POA: Diagnosis not present

## 2023-05-03 DIAGNOSIS — Z992 Dependence on renal dialysis: Secondary | ICD-10-CM | POA: Diagnosis not present

## 2023-05-03 DIAGNOSIS — N186 End stage renal disease: Secondary | ICD-10-CM | POA: Diagnosis not present

## 2023-05-05 DIAGNOSIS — Z992 Dependence on renal dialysis: Secondary | ICD-10-CM | POA: Diagnosis not present

## 2023-05-05 DIAGNOSIS — N186 End stage renal disease: Secondary | ICD-10-CM | POA: Diagnosis not present

## 2023-05-08 ENCOUNTER — Encounter: Payer: Self-pay | Admitting: Urgent Care

## 2023-05-08 ENCOUNTER — Other Ambulatory Visit (INDEPENDENT_AMBULATORY_CARE_PROVIDER_SITE_OTHER): Payer: Self-pay | Admitting: Nurse Practitioner

## 2023-05-08 DIAGNOSIS — N186 End stage renal disease: Secondary | ICD-10-CM

## 2023-05-09 DIAGNOSIS — N186 End stage renal disease: Secondary | ICD-10-CM | POA: Diagnosis not present

## 2023-05-09 DIAGNOSIS — Z992 Dependence on renal dialysis: Secondary | ICD-10-CM | POA: Diagnosis not present

## 2023-05-11 ENCOUNTER — Encounter
Admission: RE | Admit: 2023-05-11 | Discharge: 2023-05-11 | Disposition: A | Discharge status: Home / Self Care | Payer: 59 | Source: Ambulatory Visit | Attending: Vascular Surgery | Admitting: Vascular Surgery

## 2023-05-11 ENCOUNTER — Other Ambulatory Visit: Payer: Self-pay

## 2023-05-11 VITALS — BP 171/80 | HR 69 | Temp 97.5°F | Resp 18 | Ht 62.0 in | Wt 108.0 lb

## 2023-05-11 DIAGNOSIS — Z01810 Encounter for preprocedural cardiovascular examination: Secondary | ICD-10-CM | POA: Diagnosis not present

## 2023-05-11 DIAGNOSIS — N186 End stage renal disease: Secondary | ICD-10-CM | POA: Insufficient documentation

## 2023-05-11 DIAGNOSIS — Z01818 Encounter for other preprocedural examination: Secondary | ICD-10-CM | POA: Insufficient documentation

## 2023-05-11 DIAGNOSIS — Z01812 Encounter for preprocedural laboratory examination: Secondary | ICD-10-CM

## 2023-05-11 HISTORY — DX: End stage renal disease: N18.6

## 2023-05-11 HISTORY — DX: Sepsis, unspecified organism: A41.9

## 2023-05-11 HISTORY — DX: Generalized (acute) peritonitis: K65.0

## 2023-05-11 HISTORY — DX: Gastro-esophageal reflux disease without esophagitis: K21.9

## 2023-05-11 LAB — CBC WITH DIFFERENTIAL/PLATELET
Abs Immature Granulocytes: 0.01 10*3/uL (ref 0.00–0.07)
Basophils Absolute: 0 10*3/uL (ref 0.0–0.1)
Basophils Relative: 1 %
Eosinophils Absolute: 0.2 10*3/uL (ref 0.0–0.5)
Eosinophils Relative: 5 %
HCT: 38.1 % (ref 36.0–46.0)
Hemoglobin: 11.8 g/dL — ABNORMAL LOW (ref 12.0–15.0)
Immature Granulocytes: 0 %
Lymphocytes Relative: 29 %
Lymphs Abs: 1.1 10*3/uL (ref 0.7–4.0)
MCH: 23.4 pg — ABNORMAL LOW (ref 26.0–34.0)
MCHC: 31 g/dL (ref 30.0–36.0)
MCV: 75.6 fL — ABNORMAL LOW (ref 80.0–100.0)
Monocytes Absolute: 0.7 10*3/uL (ref 0.1–1.0)
Monocytes Relative: 17 %
Neutro Abs: 1.9 10*3/uL (ref 1.7–7.7)
Neutrophils Relative %: 48 %
Platelets: 211 10*3/uL (ref 150–400)
RBC: 5.04 MIL/uL (ref 3.87–5.11)
RDW: 22.4 % — ABNORMAL HIGH (ref 11.5–15.5)
Smear Review: NORMAL
WBC: 3.9 10*3/uL — ABNORMAL LOW (ref 4.0–10.5)
nRBC: 0 % (ref 0.0–0.2)

## 2023-05-11 LAB — BASIC METABOLIC PANEL
Anion gap: 16 — ABNORMAL HIGH (ref 5–15)
BUN: 73 mg/dL — ABNORMAL HIGH (ref 8–23)
CO2: 21 mmol/L — ABNORMAL LOW (ref 22–32)
Calcium: 8.1 mg/dL — ABNORMAL LOW (ref 8.9–10.3)
Chloride: 94 mmol/L — ABNORMAL LOW (ref 98–111)
Creatinine, Ser: 8.99 mg/dL — ABNORMAL HIGH (ref 0.44–1.00)
GFR, Estimated: 4 mL/min — ABNORMAL LOW (ref >60)
Glucose, Bld: 101 mg/dL — ABNORMAL HIGH (ref 70–99)
Potassium: 4.8 mmol/L (ref 3.5–5.1)
Sodium: 131 mmol/L — ABNORMAL LOW (ref 135–145)

## 2023-05-11 LAB — TYPE AND SCREEN

## 2023-05-11 NOTE — Patient Instructions (Addendum)
Your procedure is scheduled on: Friday, July 19  Report to the Registration Desk on the 1st floor of the [facility] To find out your arrival time, please call [phone] between 1PM - 3PM on: Thursday July 18  If your arrival time is 6:00 am, do not arrive before that time as the [facility] entrance doors do not open until 6:00 am.  REMEMBER: Instructions that are not followed completely may result in serious medical risk, up to and including death; or upon the discretion of your surgeon and anesthesiologist your surgery may need to be rescheduled.  Do not eat food after midnight the night before surgery.  No gum chewing or hard candies.   One week prior to surgery: Stop Anti-inflammatories (NSAIDS) such as Advil, Aleve, Ibuprofen, Motrin, Naproxen, Naprosyn and Aspirin based products such as Excedrin, Goody's Powder, BC Powder.  Stop ANY OVER THE COUNTER supplements until after surgery. calcium carbonate (TUMS - DOSED IN MG ELEMENTAL CALCIUM)  IRON-VITAMIN C PO  Multiple Vitamins-Minerals (PRESERVISION AREDS 2)   You may however, continue to take Tylenol if needed for pain up until the day of surgery.   TAKE ONLY THESE MEDICATIONS THE MORNING OF SURGERY WITH A SIP OF WATER:  propranolol (INDERAL)  hydrALAZINE (APRESOLINE)   No Alcohol for 24 hours before or after surgery.  No Smoking including e-cigarettes for 24 hours before surgery.  No chewable tobacco products for at least 6 hours before surgery.  No nicotine patches on the day of surgery.  Do not use any "recreational" drugs for at least a week (preferably 2 weeks) before your surgery.  Please be advised that the combination of cocaine and anesthesia may have negative outcomes, up to and including death. If you test positive for cocaine, your surgery will be cancelled.  On the morning of surgery brush your teeth with toothpaste and water, you may rinse your mouth with mouthwash if you wish. Do not swallow any  toothpaste or mouthwash.  Use CHG Soap as directed on instruction sheet.  Do not wear jewelry, make-up, hairpins, clips or nail polish.  Do not wear lotions, powders, or perfumes.   Do not shave body hair from the neck down 48 hours before surgery.  Contact lenses, hearing aids and dentures may not be worn into surgery.  Do not bring valuables to the hospital. [facility] is not responsible for any missing/lost belongings or valuables.   Notify your doctor if there is any change in your medical condition (cold, fever, infection).  Wear comfortable clothing (specific to your surgery type) to the hospital.  After surgery, you can help prevent lung complications by doing breathing exercises.  Take deep breaths and cough every 1-2 hours.   If you are being discharged the day of surgery, you will not be allowed to drive home. You will need a responsible individual to drive you home and stay with you for 24 hours after surgery.   If you are taking public transportation, you will need to have a responsible individual with you.  Please call the Pre-admissions Testing Dept. at [phone] if you have any questions about these instructions.  Surgery Visitation Policy:  Patients having surgery or a procedure may have two visitors.  Children under the age of 20 must have an adult with them who is not the patient.     Preparing for Surgery with CHLORHEXIDINE GLUCONATE (CHG) Soap  Chlorhexidine Gluconate (CHG) Soap  o An antiseptic cleaner that kills germs and bonds with  the skin to continue killing germs even after washing  o Used for showering the night before surgery and morning of surgery  Before surgery, you can play an important role by reducing the number of germs on your skin.  CHG (Chlorhexidine gluconate) soap is an antiseptic cleanser which kills germs and bonds with the skin to continue killing germs even after washing.  Please do not use if you have an allergy to CHG  or antibacterial soaps. If your skin becomes reddened/irritated stop using the CHG.  1. Shower the NIGHT BEFORE SURGERY and the MORNING OF SURGERY with CHG soap.  2. If you choose to wash your hair, wash your hair first as usual with your normal shampoo.  3. After shampooing, rinse your hair and body thoroughly to remove the shampoo.  4. Use CHG as you would any other liquid soap. You can apply CHG directly to the skin and wash gently with a scrungie or a clean washcloth.  5. Apply the CHG soap to your body only from the neck down. Do not use on open wounds or open sores. Avoid contact with your eyes, ears, mouth, and genitals (private parts). Wash face and genitals (private parts) with your normal soap.  6. Wash thoroughly, paying special attention to the area where your surgery will be performed.  7. Thoroughly rinse your body with warm water.  8. Do not shower/wash with your normal soap after using and rinsing off the CHG soap.  9. Pat yourself dry with a clean towel.  10. Wear clean pajamas to bed the night before surgery.  12. Place clean sheets on your bed the night of your first shower and do not sleep with pets.  13. Shower again with the CHG soap on the day of surgery prior to arriving at the hospital.  14. Do not apply any deodorants/lotions/powders.  15. Please wear clean clothes to the hospital.

## 2023-05-12 DIAGNOSIS — N186 End stage renal disease: Secondary | ICD-10-CM | POA: Diagnosis not present

## 2023-05-12 DIAGNOSIS — Z992 Dependence on renal dialysis: Secondary | ICD-10-CM | POA: Diagnosis not present

## 2023-05-13 LAB — TYPE AND SCREEN
ABO/RH(D): A POS
Antibody Screen: NEGATIVE

## 2023-05-14 DIAGNOSIS — Z992 Dependence on renal dialysis: Secondary | ICD-10-CM | POA: Diagnosis not present

## 2023-05-14 DIAGNOSIS — N186 End stage renal disease: Secondary | ICD-10-CM | POA: Diagnosis not present

## 2023-05-16 DIAGNOSIS — N186 End stage renal disease: Secondary | ICD-10-CM | POA: Diagnosis not present

## 2023-05-16 DIAGNOSIS — Z992 Dependence on renal dialysis: Secondary | ICD-10-CM | POA: Diagnosis not present

## 2023-05-19 DIAGNOSIS — N186 End stage renal disease: Secondary | ICD-10-CM | POA: Diagnosis not present

## 2023-05-19 DIAGNOSIS — Z992 Dependence on renal dialysis: Secondary | ICD-10-CM | POA: Diagnosis not present

## 2023-05-21 DIAGNOSIS — N186 End stage renal disease: Secondary | ICD-10-CM | POA: Diagnosis not present

## 2023-05-21 DIAGNOSIS — Z992 Dependence on renal dialysis: Secondary | ICD-10-CM | POA: Diagnosis not present

## 2023-05-23 DIAGNOSIS — Z992 Dependence on renal dialysis: Secondary | ICD-10-CM | POA: Diagnosis not present

## 2023-05-23 DIAGNOSIS — N186 End stage renal disease: Secondary | ICD-10-CM | POA: Diagnosis not present

## 2023-05-25 ENCOUNTER — Telehealth (INDEPENDENT_AMBULATORY_CARE_PROVIDER_SITE_OTHER): Payer: Self-pay

## 2023-05-25 NOTE — Pre-Procedure Instructions (Signed)
Patient called to ask which meds were to be taken the day of her surgery. This nurse reviewed the patient instructions with her. She verbalized understanding.

## 2023-05-25 NOTE — Telephone Encounter (Signed)
Spoke with the patient to give her the rescheduled surgery date of 05/27/23 at the [facility]. Patient was advised to call [facility] on Tuesday between 1-3 pm for the arrival time.

## 2023-05-26 DIAGNOSIS — Z992 Dependence on renal dialysis: Secondary | ICD-10-CM | POA: Diagnosis not present

## 2023-05-26 DIAGNOSIS — N186 End stage renal disease: Secondary | ICD-10-CM | POA: Diagnosis not present

## 2023-05-27 ENCOUNTER — Ambulatory Visit: Payer: 59

## 2023-05-27 ENCOUNTER — Ambulatory Visit: Payer: 59 | Admitting: Anesthesiology

## 2023-05-27 ENCOUNTER — Other Ambulatory Visit: Payer: Self-pay

## 2023-05-27 ENCOUNTER — Encounter
Admission: RE | Disposition: A | Discharge status: Home / Self Care | Payer: Self-pay | Source: Home / Self Care | Attending: Vascular Surgery

## 2023-05-27 ENCOUNTER — Ambulatory Visit
Admission: RE | Admit: 2023-05-27 | Discharge: 2023-05-27 | Disposition: A | Discharge status: Home / Self Care | Payer: 59 | Attending: Vascular Surgery | Admitting: Vascular Surgery

## 2023-05-27 ENCOUNTER — Encounter: Payer: Self-pay | Admitting: Vascular Surgery

## 2023-05-27 DIAGNOSIS — Z992 Dependence on renal dialysis: Secondary | ICD-10-CM | POA: Diagnosis not present

## 2023-05-27 DIAGNOSIS — K2100 Gastro-esophageal reflux disease with esophagitis, without bleeding: Secondary | ICD-10-CM | POA: Insufficient documentation

## 2023-05-27 DIAGNOSIS — Z01812 Encounter for preprocedural laboratory examination: Secondary | ICD-10-CM

## 2023-05-27 DIAGNOSIS — N186 End stage renal disease: Secondary | ICD-10-CM | POA: Insufficient documentation

## 2023-05-27 DIAGNOSIS — Z87891 Personal history of nicotine dependence: Secondary | ICD-10-CM | POA: Insufficient documentation

## 2023-05-27 DIAGNOSIS — G8918 Other acute postprocedural pain: Secondary | ICD-10-CM | POA: Diagnosis not present

## 2023-05-27 DIAGNOSIS — I120 Hypertensive chronic kidney disease with stage 5 chronic kidney disease or end stage renal disease: Secondary | ICD-10-CM | POA: Diagnosis not present

## 2023-05-27 DIAGNOSIS — Z8249 Family history of ischemic heart disease and other diseases of the circulatory system: Secondary | ICD-10-CM | POA: Diagnosis not present

## 2023-05-27 DIAGNOSIS — D631 Anemia in chronic kidney disease: Secondary | ICD-10-CM | POA: Diagnosis not present

## 2023-05-27 HISTORY — PX: AV FISTULA PLACEMENT: SHX1204

## 2023-05-27 LAB — POCT I-STAT, CHEM 8
BUN: 46 mg/dL — ABNORMAL HIGH (ref 8–23)
Calcium, Ion: 0.92 mmol/L — ABNORMAL LOW (ref 1.15–1.40)
Chloride: 101 mmol/L (ref 98–111)
Creatinine, Ser: 7.5 mg/dL — ABNORMAL HIGH (ref 0.44–1.00)
Glucose, Bld: 88 mg/dL (ref 70–99)
HCT: 41 % (ref 36.0–46.0)
Hemoglobin: 13.9 g/dL (ref 12.0–15.0)
Potassium: 4.4 mmol/L (ref 3.5–5.1)
Sodium: 133 mmol/L — ABNORMAL LOW (ref 135–145)
TCO2: 27 mmol/L (ref 22–32)

## 2023-05-27 LAB — TYPE AND SCREEN
ABO/RH(D): A POS
Antibody Screen: NEGATIVE

## 2023-05-27 SURGERY — ARTERIOVENOUS (AV) FISTULA CREATION
Anesthesia: General | Site: Arm Upper | Laterality: Left

## 2023-05-27 MED ORDER — ORAL CARE MOUTH RINSE: 15.0000 mL | Freq: Once | OROMUCOSAL | Status: AC

## 2023-05-27 MED ORDER — BUPIVACAINE LIPOSOME 1.3 % IJ SUSP
INTRAMUSCULAR | Status: AC
  Filled 2023-05-27: qty 20

## 2023-05-27 MED ORDER — ONDANSETRON HCL 4 MG/2ML IJ SOLN
INTRAMUSCULAR | Status: DC | PRN
Start: 2023-05-27 — End: 2023-05-27
  Administered 2023-05-27: 4 mg via INTRAVENOUS

## 2023-05-27 MED ORDER — PROPOFOL 1000 MG/100ML IV EMUL
INTRAVENOUS | Status: AC
  Filled 2023-05-27: qty 100

## 2023-05-27 MED ORDER — DEXMEDETOMIDINE HCL IN NACL 80 MCG/20ML IV SOLN
INTRAVENOUS | Status: DC | PRN
  Administered 2023-05-27: 8 ug via INTRAVENOUS

## 2023-05-27 MED ORDER — FENTANYL CITRATE (PF) 100 MCG/2ML IJ SOLN
INTRAMUSCULAR | Status: AC
  Filled 2023-05-27: qty 2

## 2023-05-27 MED ORDER — PROPOFOL 500 MG/50ML IV EMUL
INTRAVENOUS | Status: DC | PRN
  Administered 2023-05-27: 50 ug/kg/min via INTRAVENOUS

## 2023-05-27 MED ORDER — DEXAMETHASONE SODIUM PHOSPHATE 10 MG/ML IJ SOLN
INTRAMUSCULAR | Status: DC | PRN
  Administered 2023-05-27: 5 mg via INTRAVENOUS

## 2023-05-27 MED ORDER — CEFAZOLIN SODIUM-DEXTROSE 2-4 GM/100ML-% IV SOLN
2.0000 g | INTRAVENOUS | Status: AC
  Administered 2023-05-27: 2 g via INTRAVENOUS

## 2023-05-27 MED ORDER — CHLORHEXIDINE GLUCONATE 0.12 % MT SOLN
OROMUCOSAL | Status: AC
  Filled 2023-05-27: qty 15

## 2023-05-27 MED ORDER — PROPOFOL 10 MG/ML IV BOLUS
INTRAVENOUS | Status: DC | PRN
Start: 2023-05-27 — End: 2023-05-27
  Administered 2023-05-27: 20 mg via INTRAVENOUS

## 2023-05-27 MED ORDER — HEPARIN SODIUM (PORCINE) 5000 UNIT/ML IJ SOLN
INTRAMUSCULAR | Status: AC
  Filled 2023-05-27: qty 1

## 2023-05-27 MED ORDER — SODIUM CHLORIDE 0.9 % IV SOLN: INTRAVENOUS | Status: DC

## 2023-05-27 MED ORDER — PHENYLEPHRINE HCL-NACL 20-0.9 MG/250ML-% IV SOLN
INTRAVENOUS | Status: DC | PRN
  Administered 2023-05-27: 15 ug/min via INTRAVENOUS

## 2023-05-27 MED ORDER — LIDOCAINE HCL (PF) 2 % IJ SOLN
INTRAMUSCULAR | Status: AC
  Filled 2023-05-27: qty 5

## 2023-05-27 MED ORDER — BUPIVACAINE LIPOSOME 1.3 % IJ SUSP
INTRAMUSCULAR | Status: DC | PRN
  Administered 2023-05-27: 6 mL

## 2023-05-27 MED ORDER — FENTANYL CITRATE (PF) 100 MCG/2ML IJ SOLN
INTRAMUSCULAR | Status: DC | PRN
  Administered 2023-05-27: 50 ug via INTRAVENOUS
  Administered 2023-05-27: 25 ug via INTRAVENOUS

## 2023-05-27 MED ORDER — BUPIVACAINE HCL (PF) 0.5 % IJ SOLN
INTRAMUSCULAR | Status: AC
  Filled 2023-05-27: qty 20

## 2023-05-27 MED ORDER — CHLORHEXIDINE GLUCONATE CLOTH 2 % EX PADS: 6.0000 | MEDICATED_PAD | Freq: Once | CUTANEOUS | Status: DC

## 2023-05-27 MED ORDER — CEFAZOLIN SODIUM-DEXTROSE 2-4 GM/100ML-% IV SOLN
INTRAVENOUS | Status: AC
  Filled 2023-05-27: qty 100

## 2023-05-27 MED ORDER — SODIUM CHLORIDE 0.9 % IR SOLN
Status: DC | PRN
  Administered 2023-05-27: 501 mL

## 2023-05-27 MED ORDER — HEMOSTATIC AGENTS (NO CHARGE) OPTIME
TOPICAL | Status: DC | PRN
  Administered 2023-05-27: 1 via TOPICAL

## 2023-05-27 MED ORDER — BUPIVACAINE HCL 0.25 % IJ SOLN
INTRAMUSCULAR | Status: DC | PRN
  Administered 2023-05-27: 6 mL

## 2023-05-27 MED ORDER — BUPIVACAINE HCL (PF) 0.5 % IJ SOLN
INTRAMUSCULAR | Status: DC | PRN
  Administered 2023-05-27: 15 mL via PERINEURAL
  Administered 2023-05-27: 2 mL via PERINEURAL

## 2023-05-27 MED ORDER — BUPIVACAINE HCL (PF) 0.25 % IJ SOLN
INTRAMUSCULAR | Status: AC
  Filled 2023-05-27: qty 30

## 2023-05-27 MED ORDER — HYDROCODONE-ACETAMINOPHEN 5-325 MG PO TABS: 1.0000 | ORAL_TABLET | Freq: Four times a day (QID) | ORAL | 0 refills | Status: AC | PRN

## 2023-05-27 MED ORDER — FAMOTIDINE 20 MG PO TABS
ORAL_TABLET | ORAL | Status: AC
  Filled 2023-05-27: qty 1

## 2023-05-27 MED ORDER — FAMOTIDINE 20 MG PO TABS
20.0000 mg | ORAL_TABLET | Freq: Once | ORAL | Status: AC
  Administered 2023-05-27: 20 mg via ORAL

## 2023-05-27 MED ORDER — MIDAZOLAM HCL 5 MG/5ML IJ SOLN
INTRAMUSCULAR | Status: DC | PRN
  Administered 2023-05-27: 2 mg via INTRAVENOUS

## 2023-05-27 MED ORDER — MIDAZOLAM HCL 2 MG/2ML IJ SOLN
INTRAMUSCULAR | Status: AC
  Filled 2023-05-27: qty 2

## 2023-05-27 MED ORDER — CHLORHEXIDINE GLUCONATE 0.12 % MT SOLN
15.0000 mL | Freq: Once | OROMUCOSAL | Status: AC
  Administered 2023-05-27: 15 mL via OROMUCOSAL

## 2023-05-27 SURGICAL SUPPLY — 60 items
ADH SKN CLS APL DERMABOND .7 (GAUZE/BANDAGES/DRESSINGS) ×2
APL PRP STRL LF DISP 70% ISPRP (MISCELLANEOUS) ×2
APPLIER CLIP 11 MED OPEN (CLIP)
APPLIER CLIP 9.375 SM OPEN (CLIP)
APR CLP MED 11 20 MLT OPN (CLIP)
APR CLP SM 9.3 20 MLT OPN (CLIP)
BAG DECANTER FOR FLEXI CONT (MISCELLANEOUS) ×2 IMPLANT
BLADE SURG SZ11 CARB STEEL (BLADE) ×2 IMPLANT
BOOT SUTURE VASCULAR YLW (MISCELLANEOUS) ×2
BRUSH SCRUB EZ 4% CHG (MISCELLANEOUS) ×2 IMPLANT
CHLORAPREP W/TINT 26 (MISCELLANEOUS) ×2 IMPLANT
CLAMP SUTURE YELLOW 5 PAIRS (MISCELLANEOUS) ×2 IMPLANT
CLIP APPLIE 11 MED OPEN (CLIP) IMPLANT
CLIP APPLIE 9.375 SM OPEN (CLIP) IMPLANT
DERMABOND ADVANCED .7 DNX12 (GAUZE/BANDAGES/DRESSINGS) ×2 IMPLANT
DRESSING SURGICEL FIBRLLR 1X2 (HEMOSTASIS) ×2 IMPLANT
DRSG SURGICEL FIBRILLAR 1X2 (HEMOSTASIS) ×2
ELECT CAUTERY BLADE 6.4 (BLADE) ×2 IMPLANT
ELECT REM PT RETURN 9FT ADLT (ELECTROSURGICAL) ×2
ELECTRODE REM PT RTRN 9FT ADLT (ELECTROSURGICAL) ×2 IMPLANT
GLOVE BIO SURGEON STRL SZ7 (GLOVE) ×2 IMPLANT
GLOVE SURG SYN 8.0 (GLOVE) ×2 IMPLANT
GLOVE SURG SYN 8.0 PF PI (GLOVE) ×1 IMPLANT
GOWN STRL REUS W/ TWL LRG LVL3 (GOWN DISPOSABLE) ×4 IMPLANT
GOWN STRL REUS W/ TWL XL LVL3 (GOWN DISPOSABLE) ×2 IMPLANT
GOWN STRL REUS W/TWL LRG LVL3 (GOWN DISPOSABLE) ×4
GOWN STRL REUS W/TWL XL LVL3 (GOWN DISPOSABLE) ×2
IV NS 500ML (IV SOLUTION) ×2
IV NS 500ML BAXH (IV SOLUTION) ×2 IMPLANT
KIT TURNOVER KIT A (KITS) ×2 IMPLANT
LABEL OR SOLS (LABEL) ×2 IMPLANT
LOOP VESSEL MAXI 1X406 RED (MISCELLANEOUS) ×2 IMPLANT
LOOP VESSEL MINI 0.8X406 BLUE (MISCELLANEOUS) ×4 IMPLANT
MANIFOLD NEPTUNE II (INSTRUMENTS) ×2 IMPLANT
NDL FILTER BLUNT 18X1 1/2 (NEEDLE) ×1 IMPLANT
NEEDLE FILTER BLUNT 18X1 1/2 (NEEDLE) ×2 IMPLANT
NS IRRIG 500ML POUR BTL (IV SOLUTION) ×1 IMPLANT
PACK EXTREMITY ARMC (MISCELLANEOUS) ×2 IMPLANT
PAD PREP OB/GYN DISP 24X41 (PERSONAL CARE ITEMS) ×2 IMPLANT
SLING ARM M TX990204 (SOFTGOODS) ×1 IMPLANT
STOCKINETTE 48X4 2 PLY STRL (GAUZE/BANDAGES/DRESSINGS) ×1 IMPLANT
STOCKINETTE STRL 4IN 9604848 (GAUZE/BANDAGES/DRESSINGS) ×2 IMPLANT
SUT GTX CV-6 30 (SUTURE) ×4 IMPLANT
SUT MNCRL+ 5-0 UNDYED PC-3 (SUTURE) ×2 IMPLANT
SUT PROLENE 6 0 BV (SUTURE) ×10 IMPLANT
SUT PROLENE 7 0 BV 1 (SUTURE) ×2 IMPLANT
SUT SILK 2 0 (SUTURE) ×2
SUT SILK 2 0 SH (SUTURE) ×2 IMPLANT
SUT SILK 2-0 18XBRD TIE 12 (SUTURE) ×2 IMPLANT
SUT SILK 3 0 (SUTURE) ×2
SUT SILK 3-0 18XBRD TIE 12 (SUTURE) ×2 IMPLANT
SUT SILK 4 0 (SUTURE) ×2
SUT SILK 4-0 18XBRD TIE 12 (SUTURE) ×2 IMPLANT
SUT VIC AB 3-0 SH 27 (SUTURE) ×4
SUT VIC AB 3-0 SH 27X BRD (SUTURE) ×4 IMPLANT
SYR 20ML LL LF (SYRINGE) ×2 IMPLANT
SYR 3ML LL SCALE MARK (SYRINGE) ×2 IMPLANT
TAG SUTURE CLAMP YLW 5PR (MISCELLANEOUS) ×2
TRAP FLUID SMOKE EVACUATOR (MISCELLANEOUS) ×2 IMPLANT
WATER STERILE IRR 500ML POUR (IV SOLUTION) ×2 IMPLANT

## 2023-05-27 NOTE — Anesthesia Postprocedure Evaluation (Signed)
Anesthesia Post Note  Patient: Sheena Smith  Procedure(s) Performed: ARTERIOVENOUS (AV) FISTULA CREATION (Left: Arm Upper)  Patient location during evaluation: PACU Anesthesia Type: General Level of consciousness: awake and alert Pain management: pain level controlled Vital Signs Assessment: post-procedure vital signs reviewed and stable Respiratory status: spontaneous breathing, nonlabored ventilation and respiratory function stable Cardiovascular status: blood pressure returned to baseline and stable Postop Assessment: no apparent nausea or vomiting Anesthetic complications: no   No notable events documented.   Last Vitals:  Vitals:   05/27/23 1312 05/27/23 1330  BP: (!) 186/84 (!) 184/84  Pulse: 64   Resp: 14 15  Temp:  (!) 36.3 C  SpO2: 97% 98%    Last Pain:  Vitals:   05/27/23 1330  TempSrc: Temporal  PainSc:                  Foye Deer

## 2023-05-27 NOTE — H&P (View-Only) (Signed)
MRN : [id number]  Sheena Smith is a 72 y.o. (12/24/1950) female who presents with chief complaint of check access.  History of Present Illness:   Patient presents to [facility] today for creation of upper extremity AV access.   Initially she was on peritoneal dialysis but due to peritonitis this has been discontinued and a tunneled catheter was placed   Current access is via a catheter which is functioning adequately.  There have not been multiple episodes of catheter infection.  The patient denies fever and chills while on dialysis.  No tenderness or drainage at the exit site.   The patient is followed by nephrology.     The patient is right-handed.   The patient has been considering the various methods of dialysis and wishes to proceed with hemodialysis and therefore creation of AV access.   No recent shortening of the patient's walking distance or new symptoms consistent with claudication.  No history of rest pain symptoms. No new ulcers or wounds of the lower extremities have occurred.   The patient denies amaurosis fugax or recent TIA symptoms. There are no recent neurological changes noted. There is no history of DVT, PE or superficial thrombophlebitis. No recent episodes of angina or shortness of breath documented.    Duplex ultrasound of the upper extremities for vein mapping demonstrates triphasic arterial signals with small veins smaller than 2 mm and therefore not adequate for fistula creation.  No outpatient medications have been marked as taking for the 05/27/23 encounter [facility] Encounter).    Past Medical History:  Diagnosis Date   Acute peritonitis (HCC)    End stage renal disease (HCC)    GERD (gastroesophageal reflux disease)    Hypertension    Lupus (HCC)    Renal failure    Sepsis (HCC)    10/2022    Past Surgical History:  Procedure Laterality Date   CESAREAN SECTION  1985   CESAREAN SECTION  1986    COLONOSCOPY  2023   FOOT SURGERY Bilateral    [place]   IR FLUORO GUIDE CV LINE RIGHT  10/20/2022   IR FLUORO GUIDE CV LINE RIGHT  10/28/2022   IR US GUIDE VASC ACCESS RIGHT  10/20/2022   IR US GUIDE VASC ACCESS RIGHT  10/30/2022   PERITONEAL CATHETER INSERTION  07/28/2019   PERITONEAL CATHETER INSERTION  09/27/2019   revision   REMOVAL OF A DIALYSIS CATHETER N/A 10/23/2022   Procedure: REMOVAL OF A  PERITONEAL DIALYSIS CATHETER;  Surgeon: Andria Meuse, MD;  Location: [facility] OR;  Service: General;  Laterality: N/A;   SMALL INTESTINE SURGERY  1976    Social History Social History   Tobacco Use   Smoking status: Former    Types: Cigarettes   Smokeless tobacco: Never  Vaping Use   Vaping status: Never Used  Substance Use Topics   Alcohol use: Not Currently   Drug use: Not Currently    Family History Family History  Problem Relation Age of Onset   Ovarian cancer Mother    Hypertension Father    Diabetes Father     Allergies  Allergen Reactions   Nsaids     "Kidney Disease. Exception to this NSAID intolerance is aspirin 81-325mg  daily"'   Other     Per pt, states that she was informed she was allergic to sulfa after allergy testing  "Kidney  Disease. Exception to this NSAID intolerance is aspirin 81-325mg  daily"'   Sulfa Antibiotics Other (See Comments)    Per pt, states that she was informed she was allergic to sulfa after allergy testing   Elemental Sulfur      REVIEW OF SYSTEMS (Negative unless checked)  Constitutional: [] Weight loss  [] Fever  [] Chills Cardiac: [] Chest pain   [] Chest pressure   [] Palpitations   [] Shortness of breath when laying flat   [] Shortness of breath with exertion. Vascular:  [] Pain in legs with walking   [] Pain in legs at rest  [] History of DVT   [] Phlebitis   [] Swelling in legs   [] Varicose veins   [] Non-healing ulcers Pulmonary:   [] Uses home oxygen   [] Productive cough   [] Hemoptysis   [] Wheeze  [] COPD   [] Asthma Neurologic:   [] Dizziness   [] Seizures   [] History of stroke   [] History of TIA  [] Aphasia   [] Vissual changes   [] Weakness or numbness in arm   [] Weakness or numbness in leg Musculoskeletal:   [] Joint swelling   [] Joint pain   [] Low back pain Hematologic:  [] Easy bruising  [] Easy bleeding   [] Hypercoagulable state   [] Anemic Gastrointestinal:  [] Diarrhea   [] Vomiting  [x] Gastroesophageal reflux/heartburn   [] Difficulty swallowing. Genitourinary:  [x] Chronic kidney disease   [] Difficult urination  [] Frequent urination   [] Blood in urine Skin:  [] Rashes   [] Ulcers  Psychological:  [] History of anxiety   []  History of major depression.  Physical Examination  There were no vitals filed for this visit. There is no height or weight on file to calculate BMI. Gen: WD/WN, NAD Head: [place]/AT, No temporalis wasting.  Ear/Nose/Throat: Hearing grossly intact, nares w/o erythema or drainage Eyes: PER, EOMI, sclera nonicteric.  Neck: Supple, no gross masses or lesions.  No JVD.  Pulmonary:  Good air movement, no audible wheezing, no use of accessory muscles.  Cardiac: RRR, precordium non-hyperdynamic. Vascular:   Tunnel catheter clean dry and intact Vessel Right Left  Radial Palpable Palpable  Brachial Palpable Palpable  Gastrointestinal: soft, non-distended. No guarding/no peritoneal signs.  Musculoskeletal: M/S 5/5 throughout.  No deformity.  Neurologic: CN 2-12 intact. Pain and light touch intact in extremities.  Symmetrical.  Speech is fluent. Motor exam as listed above. Psychiatric: Judgment intact, Mood & affect appropriate for pt's clinical situation. Dermatologic: No rashes or ulcers noted.  No changes consistent with cellulitis.   CBC Lab Results  Component Value Date   WBC 3.9 (L) 05/11/2023   HGB 11.8 (L) 05/11/2023   HCT 38.1 05/11/2023   MCV 75.6 (L) 05/11/2023   PLT 211 05/11/2023    BMET    Component Value Date/Time   NA 131 (L) 05/11/2023 1402   NA 137 05/21/2020 0905   K 4.8 05/11/2023  1402   CL 94 (L) 05/11/2023 1402   CO2 21 (L) 05/11/2023 1402   GLUCOSE 101 (H) 05/11/2023 1402   BUN 73 (H) 05/11/2023 1402   BUN 57 (H) 05/21/2020 0905   CREATININE 8.99 (H) 05/11/2023 1402   CALCIUM 8.1 (L) 05/11/2023 1402   GFRNONAA 4 (L) 05/11/2023 1402   GFRAA 5 (L) 05/21/2020 0905   CrCl cannot be calculated (Unknown ideal weight.).  COAG Lab Results  Component Value Date   INR 1.3 (H) 10/16/2022    Radiology No results found.   Assessment/Plan 1. ESRD (HCC) Recommend:   At this time the patient does not have appropriate extremity access for dialysis   Patient should have a left arm  brachial axillary AV graft created.   The risks, benefits and alternative therapies were reviewed in detail with the patient.  All questions were answered.  The patient agrees to proceed with surgery.    The patient will follow up with me in the office after the surgery.   2. Essential hypertension Continue antihypertensive medications as already ordered, these medications have been reviewed and there are no changes at this time.   3. Gastroesophageal reflux disease with esophagitis without hemorrhage Continue PPI as already ordered, this medication has been reviewed and there are no changes at this time.   Avoidence of caffeine and alcohol   Moderate elevation of the head of the bed    Levora Dredge, MD  05/27/2023 9:17 AM

## 2023-05-27 NOTE — Anesthesia Preprocedure Evaluation (Addendum)
Anesthesia Evaluation  Patient identified by MRN, date of birth, ID band Patient awake    Reviewed: Allergy & Precautions, NPO status , Patient's Chart, lab work & pertinent test results  History of Anesthesia Complications Negative for: history of anesthetic complications  Airway Mallampati: IV  TM Distance: >3 FB Neck ROM: Full    Dental  (+) Edentulous Lower, Edentulous Upper, Dental Advisory Given   Pulmonary former smoker   Pulmonary exam normal        Cardiovascular Exercise Tolerance: Good hypertension, Pt. on medications and Pt. on home beta blockers Normal cardiovascular exam  Normal echo 2021   Neuro/Psych negative neurological ROS     GI/Hepatic Neg liver ROS,GERD  Controlled,,  Endo/Other  negative endocrine ROS    Renal/GU ESRF and DialysisRenal disease  negative genitourinary   Musculoskeletal negative musculoskeletal ROS (+)    Abdominal Normal abdominal exam  (+)   Peds  Hematology  (+) Blood dyscrasia, anemia   Anesthesia Other Findings Lupus  Reproductive/Obstetrics                              Anesthesia Physical Anesthesia Plan  ASA: 3  Anesthesia Plan: General   Post-op Pain Management: Regional block*   Induction: Intravenous  PONV Risk Score and Plan: 3 and Ondansetron, Dexamethasone and Treatment may vary due to age or medical condition  Airway Management Planned: Natural Airway  Additional Equipment: None  Intra-op Plan:   Post-operative Plan:   Informed Consent: I have reviewed the patients History and Physical, chart, labs and discussed the procedure including the risks, benefits and alternatives for the proposed anesthesia with the patient or authorized representative who has indicated his/her understanding and acceptance.     Dental advisory given  Plan Discussed with:   Anesthesia Plan Comments: (BP elevated but near baseline. )         Anesthesia Quick Evaluation

## 2023-05-27 NOTE — Anesthesia Procedure Notes (Signed)
Date/Time: 05/27/2023 10:50 AM  Performed by: Ginger Carne, CRNAPre-anesthesia Checklist: Patient identified, Emergency Drugs available, Suction available, Patient being monitored and Timeout performed Patient Re-evaluated:Patient Re-evaluated prior to induction Oxygen Delivery Method: Simple face mask Preoxygenation: Pre-oxygenation with 100% oxygen Induction Type: IV induction

## 2023-05-27 NOTE — Interval H&P Note (Signed)
History and Physical Interval Note:  05/27/2023 10:10 AM  Sheena Smith  has presented today for surgery, with the diagnosis of ESRD.  The various methods of treatment have been discussed with the patient and family. After consideration of risks, benefits and other options for treatment, the patient has consented to  Procedure(s): INSERTION OF ARTERIOVENOUS (AV) GORE-TEX GRAFT ARM (Left) as a surgical intervention.  The patient's history has been reviewed, patient examined, no change in status, stable for surgery.  I have reviewed the patient's chart and labs.  Questions were answered to the patient's satisfaction.     Levora Dredge

## 2023-05-27 NOTE — Op Note (Signed)
     OPERATIVE NOTE   PROCEDURE: left brachial cephalic arteriovenous fistula placement  PRE-OPERATIVE DIAGNOSIS: End Stage Renal Disease  POST-OPERATIVE DIAGNOSIS: End Stage Renal Disease  SURGEON: Earl Lites Nyle Limb  ASSISTANT(S): Rolla Plate, NP  ANESTHESIA: regional  ESTIMATED BLOOD LOSS: <50 cc  FINDING(S): 4.5 mm cephalic vein  SPECIMEN(S):  none  INDICATIONS:   Sheena Smith is a 72 y.o. female who presents with end stage renal disease.  The patient is scheduled for left brachiocephalic arteriovenous fistula placement.  The patient is aware the risks include but are not limited to: bleeding, infection, steal syndrome, nerve damage, ischemic monomelic neuropathy, failure to mature, and need for additional procedures.  The patient is aware of the risks of the procedure and elects to proceed forward.  DESCRIPTION: After full informed written consent was obtained from the patient, the patient was brought back to the operating room and placed supine upon the operating table.  Prior to induction, the patient received IV antibiotics.   After obtaining adequate anesthesia, the patient was then prepped and draped in the standard fashion for a left  arm access procedure.   A first assistant was required to provide a safe and appropriate environment for executing the surgery.  The assistant was integral in providing retraction, exposure, running suture providing suction and in the closing process.   A curvilinear incision was then created midway between the radial impulse and the cephalic vein. The cephalic vein was then identified and dissected circumferentially. It was marked with a surgical marker.    Attention was then turned to the brachial artery which was exposed through the same incision and looped proximally and distally. Side branches were controlled with 4-0 silk ties.  The distal segment of the vein was ligated with a  2-0 silk, and the vein was transected.  The proximal  segment was interrogated with serial dilators.  The vein accepted up to a 4.5 mm dilator without any difficulty. Heparinized saline was infused into the vein and clamped it with a small bulldog.  At this point, I reset my exposure of the brachial artery and controlled the artery with vessel loops proximally and distally.  An arteriotomy was then made with a #11 blade, and extended with a Potts scissor.  Heparinized saline was injected proximal and distal into the radial artery.  The vein was then approximated to the artery while the artery was in its native bed and subsequently the vein was beveled using Potts scissors. The vein was then sewn to the artery in an end-to-side configuration with a running stitch of 6-0 Prolene.  Prior to completing this anastomosis Flushing maneuvers were performed and the artery was allowed to forward and back bleed.  There was no evidence of clot from any vessels.  I completed the anastomosis in the usual fashion and then released all vessel loops and clamps.    There was good  thrill in the venous outflow, and there was 1+ palpable radial pulse.  At this point, I irrigated out the surgical wound.  There was no further active bleeding.  The subcutaneous tissue was reapproximated with a running stitch of 3-0 Vicryl.  The skin was then reapproximated with a running subcuticular stitch of 4-0 Vicryl.  The skin was then cleaned, dried, and reinforced with Dermabond.    The patient tolerated this procedure well.   COMPLICATIONS: None  CONDITION: Velna Hatchet [facility]  Office: [phone]   05/27/2023, 12:36 PM

## 2023-05-27 NOTE — Discharge Instructions (Signed)
AMBULATORY SURGERY  DISCHARGE INSTRUCTIONS   The drugs that you were given will stay in your system until tomorrow so for the next 24 hours you should not:  Drive an automobile Make any legal decisions Drink any alcoholic beverage   You may resume regular meals tomorrow.  Today it is better to start with liquids and gradually work up to solid foods.  You may eat anything you prefer, but it is better to start with liquids, then soup and crackers, and gradually work up to solid foods.   Please notify your doctor immediately if you have any unusual bleeding, trouble breathing, redness and pain at the surgery site, drainage, fever, or pain not relieved by medication.  Information for Discharge Teaching:  DO NOT REMOVE TEAL EXPAREL BRACELET FOR 4 DAYS (96 hours) : 05/31/2023 EXPAREL (bupivacaine liposome injectable suspension)   Your surgeon or anesthesiologist gave you EXPAREL(bupivacaine) to help control your pain after surgery.  EXPAREL is a local anesthetic that provides pain relief by numbing the tissue around the surgical site. EXPAREL is designed to release pain medication over time and can control pain for up to 72 hours. Depending on how you respond to EXPAREL, you may require less pain medication during your recovery.  Possible side effects: Temporary loss of sensation or ability to move in the area where bupivacaine was injected. Nausea, vomiting, constipation Rarely, numbness and tingling in your mouth or lips, lightheadedness, or anxiety may occur. Call your doctor right away if you think you may be experiencing any of these sensations, or if you have other questions regarding possible side effects.  Follow all other discharge instructions given to you by your surgeon or nurse. Eat a healthy diet and drink plenty of water or other fluids.  If you return to the hospital for any reason within 96 hours following the administration of EXPAREL, it is important for health care  providers to know that you have received this anesthetic. A teal colored band has been placed on your arm with the date, time and amount of EXPAREL you have received in order to alert and inform your health care providers. Please leave this armband in place for the full 96 hours following administration, and then you may remove the band.        Please contact your physician with any problems or Same Day Surgery at [phone], Monday through Friday 6 am to 4 pm, or [place] at [facility] number at [phone].

## 2023-05-27 NOTE — Progress Notes (Signed)
MRN : [id number]  Sheena Smith is a 72 y.o. (12/24/1950) female who presents with chief complaint of check access.  History of Present Illness:   Patient presents to [facility] today for creation of upper extremity AV access.   Initially she was on peritoneal dialysis but due to peritonitis this has been discontinued and a tunneled catheter was placed   Current access is via a catheter which is functioning adequately.  There have not been multiple episodes of catheter infection.  The patient denies fever and chills while on dialysis.  No tenderness or drainage at the exit site.   The patient is followed by nephrology.     The patient is right-handed.   The patient has been considering the various methods of dialysis and wishes to proceed with hemodialysis and therefore creation of AV access.   No recent shortening of the patient's walking distance or new symptoms consistent with claudication.  No history of rest pain symptoms. No new ulcers or wounds of the lower extremities have occurred.   The patient denies amaurosis fugax or recent TIA symptoms. There are no recent neurological changes noted. There is no history of DVT, PE or superficial thrombophlebitis. No recent episodes of angina or shortness of breath documented.    Duplex ultrasound of the upper extremities for vein mapping demonstrates triphasic arterial signals with small veins smaller than 2 mm and therefore not adequate for fistula creation.  No outpatient medications have been marked as taking for the 05/27/23 encounter [facility] Encounter).    Past Medical History:  Diagnosis Date   Acute peritonitis (HCC)    End stage renal disease (HCC)    GERD (gastroesophageal reflux disease)    Hypertension    Lupus (HCC)    Renal failure    Sepsis (HCC)    10/2022    Past Surgical History:  Procedure Laterality Date   CESAREAN SECTION  1985   CESAREAN SECTION  1986    COLONOSCOPY  2023   FOOT SURGERY Bilateral    Bunions   IR FLUORO GUIDE CV LINE RIGHT  10/20/2022   IR FLUORO GUIDE CV LINE RIGHT  10/28/2022   IR US GUIDE VASC ACCESS RIGHT  10/20/2022   IR US GUIDE VASC ACCESS RIGHT  10/30/2022   PERITONEAL CATHETER INSERTION  07/28/2019   PERITONEAL CATHETER INSERTION  09/27/2019   revision   REMOVAL OF A DIALYSIS CATHETER N/A 10/23/2022   Procedure: REMOVAL OF A  PERITONEAL DIALYSIS CATHETER;  Surgeon: Andria Meuse, MD;  Location: [facility] OR;  Service: General;  Laterality: N/A;   SMALL INTESTINE SURGERY  1976    Social History Social History   Tobacco Use   Smoking status: Former    Types: Cigarettes   Smokeless tobacco: Never  Vaping Use   Vaping status: Never Used  Substance Use Topics   Alcohol use: Not Currently   Drug use: Not Currently    Family History Family History  Problem Relation Age of Onset   Ovarian cancer Mother    Hypertension Father    Diabetes Father     Allergies  Allergen Reactions   Nsaids     "Kidney Disease. Exception to this NSAID intolerance is aspirin 81-325mg  daily"'   Other     Per pt, states that she was informed she was allergic to sulfa after allergy testing  "Kidney  Disease. Exception to this NSAID intolerance is aspirin 81-325mg  daily"'   Sulfa Antibiotics Other (See Comments)    Per pt, states that she was informed she was allergic to sulfa after allergy testing   Elemental Sulfur      REVIEW OF SYSTEMS (Negative unless checked)  Constitutional: [] Weight loss  [] Fever  [] Chills Cardiac: [] Chest pain   [] Chest pressure   [] Palpitations   [] Shortness of breath when laying flat   [] Shortness of breath with exertion. Vascular:  [] Pain in legs with walking   [] Pain in legs at rest  [] History of DVT   [] Phlebitis   [] Swelling in legs   [] Varicose veins   [] Non-healing ulcers Pulmonary:   [] Uses home oxygen   [] Productive cough   [] Hemoptysis   [] Wheeze  [] COPD   [] Asthma Neurologic:   [] Dizziness   [] Seizures   [] History of stroke   [] History of TIA  [] Aphasia   [] Vissual changes   [] Weakness or numbness in arm   [] Weakness or numbness in leg Musculoskeletal:   [] Joint swelling   [] Joint pain   [] Low back pain Hematologic:  [] Easy bruising  [] Easy bleeding   [] Hypercoagulable state   [] Anemic Gastrointestinal:  [] Diarrhea   [] Vomiting  [x] Gastroesophageal reflux/heartburn   [] Difficulty swallowing. Genitourinary:  [x] Chronic kidney disease   [] Difficult urination  [] Frequent urination   [] Blood in urine Skin:  [] Rashes   [] Ulcers  Psychological:  [] History of anxiety   []  History of major depression.  Physical Examination  There were no vitals filed for this visit. There is no height or weight on file to calculate BMI. Gen: WD/WN, NAD Head: [place]/AT, No temporalis wasting.  Ear/Nose/Throat: Hearing grossly intact, nares w/o erythema or drainage Eyes: PER, EOMI, sclera nonicteric.  Neck: Supple, no gross masses or lesions.  No JVD.  Pulmonary:  Good air movement, no audible wheezing, no use of accessory muscles.  Cardiac: RRR, precordium non-hyperdynamic. Vascular:   Tunnel catheter clean dry and intact Vessel Right Left  Radial Palpable Palpable  Brachial Palpable Palpable  Gastrointestinal: soft, non-distended. No guarding/no peritoneal signs.  Musculoskeletal: M/S 5/5 throughout.  No deformity.  Neurologic: CN 2-12 intact. Pain and light touch intact in extremities.  Symmetrical.  Speech is fluent. Motor exam as listed above. Psychiatric: Judgment intact, Mood & affect appropriate for pt's clinical situation. Dermatologic: No rashes or ulcers noted.  No changes consistent with cellulitis.   CBC Lab Results  Component Value Date   WBC 3.9 (L) 05/11/2023   HGB 11.8 (L) 05/11/2023   HCT 38.1 05/11/2023   MCV 75.6 (L) 05/11/2023   PLT 211 05/11/2023    BMET    Component Value Date/Time   NA 131 (L) 05/11/2023 1402   NA 137 05/21/2020 0905   K 4.8 05/11/2023  1402   CL 94 (L) 05/11/2023 1402   CO2 21 (L) 05/11/2023 1402   GLUCOSE 101 (H) 05/11/2023 1402   BUN 73 (H) 05/11/2023 1402   BUN 57 (H) 05/21/2020 0905   CREATININE 8.99 (H) 05/11/2023 1402   CALCIUM 8.1 (L) 05/11/2023 1402   GFRNONAA 4 (L) 05/11/2023 1402   GFRAA 5 (L) 05/21/2020 0905   CrCl cannot be calculated (Unknown ideal weight.).  COAG Lab Results  Component Value Date   INR 1.3 (H) 10/16/2022    Radiology No results found.   Assessment/Plan 1. ESRD (HCC) Recommend:   At this time the patient does not have appropriate extremity access for dialysis   Patient should have a left arm  brachial axillary AV graft created.   The risks, benefits and alternative therapies were reviewed in detail with the patient.  All questions were answered.  The patient agrees to proceed with surgery.    The patient will follow up with me in the office after the surgery.   2. Essential hypertension Continue antihypertensive medications as already ordered, these medications have been reviewed and there are no changes at this time.   3. Gastroesophageal reflux disease with esophagitis without hemorrhage Continue PPI as already ordered, this medication has been reviewed and there are no changes at this time.   Avoidence of caffeine and alcohol   Moderate elevation of the head of the bed    Levora Dredge, MD  05/27/2023 9:17 AM

## 2023-05-27 NOTE — Transfer of Care (Signed)
Immediate Anesthesia Transfer of Care Note  Patient: Sheena Smith  Procedure(s) Performed: ARTERIOVENOUS (AV) FISTULA CREATION (Left: Arm Upper)  Patient Location: PACU  Anesthesia Type:General  Level of Consciousness: awake, alert , and oriented  Airway & Oxygen Therapy: Patient Spontanous Breathing  Post-op Assessment: Report given to RN and Post -op Vital signs reviewed and stable  Post vital signs: Reviewed and stable  Last Vitals:  Vitals Value Taken Time  BP 174/90 05/27/23 1246  Temp 36.1 C 05/27/23 1243  Pulse 63 05/27/23 1246  Resp 23 05/27/23 1246  SpO2 98 % 05/27/23 1246  Vitals shown include unfiled device data.  Last Pain:  Vitals:   05/27/23 0948  TempSrc: Oral  PainSc: 0-No pain         Complications: No notable events documented.

## 2023-05-27 NOTE — Anesthesia Procedure Notes (Signed)
Anesthesia Regional Block: Supraclavicular block (With axillary ring block)   Pre-Anesthetic Checklist: , timeout performed,  Correct Patient, Correct Site, Correct Laterality,  Correct Procedure, Correct Position, site marked,  Risks and benefits discussed,  Surgical consent,  Pre-op evaluation,  At surgeon's request and post-op pain management  Laterality: Upper and Left  Prep: chloraprep       Needles:  Injection technique: Single-shot  Needle Type: Stimiplex     Needle Length: 9cm  Needle Gauge: 22     Additional Needles:   Procedures:,,,, ultrasound used (permanent image in chart),,    Narrative:  Start time: 05/27/2023 10:20 AM End time: 05/27/2023 10:25 AM Injection made incrementally with aspirations every 5 mL.  Performed by: Personally  Anesthesiologist: Foye Deer, MD  Additional Notes: Patient consented for risk and benefits of nerve block including but not limited to nerve damage, failed block, bleeding and infection.  Patient voiced understanding.  Functioning IV was confirmed and monitors were applied.  Timeout done prior to procedure and prior to any sedation being given to the patient.  Patient confirmed procedure site prior to any sedation given to the patient. Sterile prep,hand hygiene and sterile gloves were used.  Minimal sedation used for procedure.  No paresthesia endorsed by patient during the procedure.  Negative aspiration and negative test dose prior to incremental administration of local anesthetic. The patient tolerated the procedure well with no immediate complications.

## 2023-05-28 ENCOUNTER — Encounter: Payer: Self-pay | Admitting: Vascular Surgery

## 2023-05-29 ENCOUNTER — Encounter: Payer: Self-pay | Admitting: Vascular Surgery

## 2023-05-30 DIAGNOSIS — N186 End stage renal disease: Secondary | ICD-10-CM | POA: Diagnosis not present

## 2023-05-30 DIAGNOSIS — Z992 Dependence on renal dialysis: Secondary | ICD-10-CM | POA: Diagnosis not present

## 2023-06-02 DIAGNOSIS — Z992 Dependence on renal dialysis: Secondary | ICD-10-CM | POA: Diagnosis not present

## 2023-06-02 DIAGNOSIS — N186 End stage renal disease: Secondary | ICD-10-CM | POA: Diagnosis not present

## 2023-06-03 DIAGNOSIS — Z992 Dependence on renal dialysis: Secondary | ICD-10-CM | POA: Diagnosis not present

## 2023-06-03 DIAGNOSIS — N186 End stage renal disease: Secondary | ICD-10-CM | POA: Diagnosis not present

## 2023-06-04 DIAGNOSIS — Z992 Dependence on renal dialysis: Secondary | ICD-10-CM | POA: Diagnosis not present

## 2023-06-04 DIAGNOSIS — N186 End stage renal disease: Secondary | ICD-10-CM | POA: Diagnosis not present

## 2023-06-06 DIAGNOSIS — Z992 Dependence on renal dialysis: Secondary | ICD-10-CM | POA: Diagnosis not present

## 2023-06-06 DIAGNOSIS — N186 End stage renal disease: Secondary | ICD-10-CM | POA: Diagnosis not present

## 2023-06-09 DIAGNOSIS — Z992 Dependence on renal dialysis: Secondary | ICD-10-CM | POA: Diagnosis not present

## 2023-06-09 DIAGNOSIS — N186 End stage renal disease: Secondary | ICD-10-CM | POA: Diagnosis not present

## 2023-06-13 DIAGNOSIS — N186 End stage renal disease: Secondary | ICD-10-CM | POA: Diagnosis not present

## 2023-06-13 DIAGNOSIS — Z139 Encounter for screening, unspecified: Secondary | ICD-10-CM | POA: Diagnosis not present

## 2023-06-13 DIAGNOSIS — Z992 Dependence on renal dialysis: Secondary | ICD-10-CM | POA: Diagnosis not present

## 2023-06-16 DIAGNOSIS — Z992 Dependence on renal dialysis: Secondary | ICD-10-CM | POA: Diagnosis not present

## 2023-06-16 DIAGNOSIS — N186 End stage renal disease: Secondary | ICD-10-CM | POA: Diagnosis not present

## 2023-06-18 DIAGNOSIS — N186 End stage renal disease: Secondary | ICD-10-CM | POA: Diagnosis not present

## 2023-06-18 DIAGNOSIS — Z992 Dependence on renal dialysis: Secondary | ICD-10-CM | POA: Diagnosis not present

## 2023-06-20 DIAGNOSIS — N186 End stage renal disease: Secondary | ICD-10-CM | POA: Diagnosis not present

## 2023-06-20 DIAGNOSIS — Z992 Dependence on renal dialysis: Secondary | ICD-10-CM | POA: Diagnosis not present

## 2023-06-23 ENCOUNTER — Other Ambulatory Visit (INDEPENDENT_AMBULATORY_CARE_PROVIDER_SITE_OTHER): Payer: Self-pay | Admitting: Vascular Surgery

## 2023-06-23 DIAGNOSIS — Z9889 Other specified postprocedural states: Secondary | ICD-10-CM

## 2023-06-23 DIAGNOSIS — Z992 Dependence on renal dialysis: Secondary | ICD-10-CM | POA: Diagnosis not present

## 2023-06-23 DIAGNOSIS — N186 End stage renal disease: Secondary | ICD-10-CM

## 2023-06-24 ENCOUNTER — Encounter (INDEPENDENT_AMBULATORY_CARE_PROVIDER_SITE_OTHER): Payer: Self-pay | Admitting: Nurse Practitioner

## 2023-06-24 ENCOUNTER — Ambulatory Visit (INDEPENDENT_AMBULATORY_CARE_PROVIDER_SITE_OTHER): Payer: 59

## 2023-06-24 ENCOUNTER — Ambulatory Visit (INDEPENDENT_AMBULATORY_CARE_PROVIDER_SITE_OTHER): Payer: 59 | Admitting: Nurse Practitioner

## 2023-06-24 VITALS — BP 154/74 | HR 68 | Resp 16 | Wt 109.6 lb

## 2023-06-24 DIAGNOSIS — I10 Essential (primary) hypertension: Secondary | ICD-10-CM

## 2023-06-24 DIAGNOSIS — Z9889 Other specified postprocedural states: Secondary | ICD-10-CM

## 2023-06-24 DIAGNOSIS — N186 End stage renal disease: Secondary | ICD-10-CM | POA: Diagnosis not present

## 2023-06-25 ENCOUNTER — Encounter (INDEPENDENT_AMBULATORY_CARE_PROVIDER_SITE_OTHER): Payer: Self-pay | Admitting: Nurse Practitioner

## 2023-06-25 DIAGNOSIS — N186 End stage renal disease: Secondary | ICD-10-CM | POA: Diagnosis not present

## 2023-06-25 DIAGNOSIS — Z992 Dependence on renal dialysis: Secondary | ICD-10-CM | POA: Diagnosis not present

## 2023-06-25 NOTE — Progress Notes (Signed)
Subjective:    Patient ID: Doneta Public, female    DOB: 1951/04/08, 72 y.o.   MRN: [id number] Chief Complaint  Patient presents with   Follow-up    [facility] 3 week with HDA    The patient returns to the office for followup of their dialysis access.  The patient had a new brachiocephalic AV fistula placed on 02/14/2439.  The patient reports the function of the access has been stable. The patient denies hand pain or other symptoms consistent with steal phenomena.  No significant arm swelling.  She is still currently maintained via PermCath and it is working well.  The patient denies any complaints from the dialysis center or their nephrologist.  The patient denies redness or swelling at the access site. The patient denies fever or chills at home or while on dialysis.  No recent shortening of the patient's walking distance or new symptoms consistent with claudication.  No history of rest pain symptoms. No new ulcers or wounds of the lower extremities have occurred.  The patient denies amaurosis fugax or recent TIA symptoms. There are no recent neurological changes noted. There is no history of DVT, PE or superficial thrombophlebitis. No recent episodes of angina or shortness of breath documented.   Duplex ultrasound of the AV access shows a patent access.  No evidence of significant stenosis.  Flow volume of 1413 this is the first look of the access.      Review of Systems  Hematological:  Does not bruise/bleed easily.  All other systems reviewed and are negative.      Objective:   Physical Exam Vitals reviewed.  HENT:     Head: Normocephalic.  Cardiovascular:     Rate and Rhythm: Normal rate and regular rhythm.     Pulses:          Radial pulses are 2+ on the left side.     Arteriovenous access: Left arteriovenous access is present.    Comments: Good thrill and bruit Pulmonary:     Effort: Pulmonary effort is normal.  Skin:    General: Skin is warm and dry.   Neurological:     Mental Status: She is alert and oriented to person, place, and time.  Psychiatric:        Mood and Affect: Mood normal.        Thought Content: Thought content normal.        Judgment: Judgment normal.     BP (!) 154/74 (BP Location: Right Arm)   Pulse 68   Resp 16   Wt 109 lb 9.6 oz (49.7 kg)   BMI 20.05 kg/m   Past Medical History:  Diagnosis Date   Acute peritonitis (HCC)    End stage renal disease (HCC)    GERD (gastroesophageal reflux disease)    Hypertension    Lupus (HCC)    Renal failure    Sepsis (HCC)    10/2022    Social History   Socioeconomic History   Marital status: Widowed    Spouse name: Not on file   Number of children: Not on file   Years of education: Not on file   Highest education level: Not on file  Occupational History   Not on file  Tobacco Use   Smoking status: Former    Types: Cigarettes   Smokeless tobacco: Never  Vaping Use   Vaping status: Never Used  Substance and Sexual Activity   Alcohol use: Not Currently   Drug use: Not  Currently   Sexual activity: Not on file  Other Topics Concern   Not on file  Social History Narrative   Not on file   Social Determinants of Health   Financial Resource Strain: Not on file  Food Insecurity: No Food Insecurity (10/31/2022)   Hunger Vital Sign    Worried About Running Out of Food in the Last Year: Never true    Ran Out of Food in the Last Year: Never true  Transportation Needs: No Transportation Needs (10/31/2022)   PRAPARE - Administrator, Civil Service (Medical): No    Lack of Transportation (Non-Medical): No  Physical Activity: Not on file  Stress: No Stress Concern Present (01/23/2023)   Received from [facility] - Occupational Stress Questionnaire    Feeling of Stress : Only a little  Social Connections: Unknown (12/24/2022)   Received from [facility]   Social Network    Social Network: Not on file   Intimate Partner Violence: Unknown (12/24/2022)   Received from [facility]   HITS    Physically Hurt: Not on file    Insult or Talk Down To: Not on file    Threaten Physical Harm: Not on file    Scream or Curse: Not on file    Past Surgical History:  Procedure Laterality Date   AV FISTULA PLACEMENT Left 05/27/2023   Procedure: ARTERIOVENOUS (AV) FISTULA CREATION;  Surgeon: Renford Dills, MD;  Location: [facility] ORS;  Service: Vascular;  Laterality: Left;   CESAREAN SECTION  1985   CESAREAN SECTION  1986   COLONOSCOPY  2023   FOOT SURGERY Bilateral    Bunions   IR FLUORO GUIDE CV LINE RIGHT  10/20/2022   IR FLUORO GUIDE CV LINE RIGHT  10/28/2022   IR US GUIDE VASC ACCESS RIGHT  10/20/2022   IR US GUIDE VASC ACCESS RIGHT  10/30/2022   PERITONEAL CATHETER INSERTION  07/28/2019   PERITONEAL CATHETER INSERTION  09/27/2019   revision   REMOVAL OF A DIALYSIS CATHETER N/A 10/23/2022   Procedure: REMOVAL OF A  PERITONEAL DIALYSIS CATHETER;  Surgeon: Andria Meuse, MD;  Location: [facility] OR;  Service: General;  Laterality: N/A;   SMALL INTESTINE SURGERY  1976    Family History  Problem Relation Age of Onset   Ovarian cancer Mother    Hypertension Father    Diabetes Father     Allergies  Allergen Reactions   Nsaids     "Kidney Disease. Exception to this NSAID intolerance is aspirin 81-325mg  daily"'   Other     Per pt, states that she was informed she was allergic to sulfa after allergy testing  "Kidney Disease. Exception to this NSAID intolerance is aspirin 81-325mg  daily"'   Sulfa Antibiotics Other (See Comments)    Per pt, states that she was informed she was allergic to sulfa after allergy testing   Elemental Sulfur        Latest Ref Rng & Units 05/27/2023    9:48 AM 05/11/2023    2:02 PM 03/25/2023    6:06 PM  CBC  WBC 4.0 - 10.5 K/uL  3.9  6.5   Hemoglobin 12.0 - 15.0 g/dL 16.1  09.6  04.5   Hematocrit 36.0 - 46.0 % 41.0  38.1  33.8   Platelets 150 - 400 K/uL   211  232       CMP     Component Value Date/Time   NA 133 (  L) 05/27/2023 0948   NA 137 05/21/2020 0905   K 4.4 05/27/2023 0948   CL 101 05/27/2023 0948   CO2 21 (L) 05/11/2023 1402   GLUCOSE 88 05/27/2023 0948   BUN 46 (H) 05/27/2023 0948   BUN 57 (H) 05/21/2020 0905   CREATININE 7.50 (H) 05/27/2023 0948   CALCIUM 8.1 (L) 05/11/2023 1402   PROT 6.8 10/16/2022 0031   PROT 6.2 05/21/2020 0905   ALBUMIN 1.8 (L) 11/06/2022 0201   ALBUMIN 3.2 (L) 05/21/2020 0905   AST 15 10/16/2022 0031   ALT 10 10/16/2022 0031   ALKPHOS 48 10/16/2022 0031   BILITOT 2.3 (H) 10/16/2022 0031   BILITOT 0.4 05/21/2020 0905   GFRNONAA 4 (L) 05/11/2023 1402     No results found.     Assessment & Plan:   1. ESRD (end stage renal disease) (HCC) Today the patient has a good thrill and bruit as well as an adequate flow volume of 1413.  There is no significant stenosis noted however her fistula is still fairly new at less than 33 weeks old.  Based on this I have advised her to give an additional 6 weeks prior to use there which on the patient's fistula should be ready to utilize.  Will have the patient follow-up in 3 months or sooner if issues arise.  The patient also noted that she has recently been getting calls to receive a kidney.  We discussed that if she were to receive a kidney no further intervention will be needed to the fistula it would stay in place.  However, this would not be detrimental for her.  If that were also to be the case we would only follow with her annually.  2. Essential hypertension Continue antihypertensive medications as already ordered, these medications have been reviewed and there are no changes at this time.   Current Outpatient Medications on File Prior to Visit  Medication Sig Dispense Refill   amLODipine (NORVASC) 10 MG tablet Take 10 mg by mouth daily with lunch.     calcium carbonate (TUMS - DOSED IN MG ELEMENTAL CALCIUM) 500 MG chewable tablet Chew 1 tablet by  mouth 2 (two) times daily.     doxazosin (CARDURA) 2 MG tablet Take 2 mg by mouth daily in the afternoon.     furosemide (LASIX) 80 MG tablet Take 80 mg by mouth 2 (two) times daily.     hydrALAZINE (APRESOLINE) 100 MG tablet Take 100 mg by mouth 3 (three) times daily. Dinner     HYDROcodone-acetaminophen (NORCO) 5-325 MG tablet Take 1-2 tablets by mouth every 6 (six) hours as needed for moderate pain. 40 tablet 0   hydroxychloroquine (PLAQUENIL) 200 MG tablet Take 200 mg by mouth every other day.     irbesartan (AVAPRO) 300 MG tablet Take 300 mg by mouth every evening.     IRON-VITAMIN C PO Take 2 capsules by mouth in the morning and at bedtime.     Multiple Vitamins-Minerals (PRESERVISION AREDS 2) CHEW Chew 1 tablet by mouth in the morning and at bedtime.     propranolol (INDERAL) 40 MG tablet Take 40 mg by mouth 4 (four) times daily.     sevelamer carbonate (RENVELA) 2.4 g PACK Take 2.4 g by mouth 3 (three) times daily with meals. (Patient not taking: Reported on 05/27/2023)     No current facility-administered medications on file prior to visit.    There are no Patient Instructions on file for this visit. No follow-ups on  file.   Georgiana Spinner, NP

## 2023-06-27 DIAGNOSIS — N186 End stage renal disease: Secondary | ICD-10-CM | POA: Diagnosis not present

## 2023-06-27 DIAGNOSIS — Z992 Dependence on renal dialysis: Secondary | ICD-10-CM | POA: Diagnosis not present

## 2023-06-30 DIAGNOSIS — Z992 Dependence on renal dialysis: Secondary | ICD-10-CM | POA: Diagnosis not present

## 2023-06-30 DIAGNOSIS — N186 End stage renal disease: Secondary | ICD-10-CM | POA: Diagnosis not present

## 2023-07-02 DIAGNOSIS — I120 Hypertensive chronic kidney disease with stage 5 chronic kidney disease or end stage renal disease: Secondary | ICD-10-CM | POA: Diagnosis not present

## 2023-07-02 DIAGNOSIS — R000 Tachycardia, unspecified: Secondary | ICD-10-CM | POA: Diagnosis not present

## 2023-07-02 DIAGNOSIS — I10 Essential (primary) hypertension: Secondary | ICD-10-CM | POA: Diagnosis not present

## 2023-07-02 DIAGNOSIS — Z87891 Personal history of nicotine dependence: Secondary | ICD-10-CM | POA: Diagnosis not present

## 2023-07-02 DIAGNOSIS — M3214 Glomerular disease in systemic lupus erythematosus: Secondary | ICD-10-CM | POA: Diagnosis not present

## 2023-07-02 DIAGNOSIS — Z5181 Encounter for therapeutic drug level monitoring: Secondary | ICD-10-CM | POA: Diagnosis not present

## 2023-07-02 DIAGNOSIS — Z940 Kidney transplant status: Secondary | ICD-10-CM | POA: Diagnosis not present

## 2023-07-02 DIAGNOSIS — Z9689 Presence of other specified functional implants: Secondary | ICD-10-CM | POA: Diagnosis not present

## 2023-07-02 DIAGNOSIS — Z9889 Other specified postprocedural states: Secondary | ICD-10-CM | POA: Diagnosis not present

## 2023-07-02 DIAGNOSIS — J90 Pleural effusion, not elsewhere classified: Secondary | ICD-10-CM | POA: Diagnosis not present

## 2023-07-02 DIAGNOSIS — J810 Acute pulmonary edema: Secondary | ICD-10-CM | POA: Diagnosis not present

## 2023-07-02 DIAGNOSIS — N186 End stage renal disease: Secondary | ICD-10-CM | POA: Diagnosis not present

## 2023-07-02 DIAGNOSIS — R7989 Other specified abnormal findings of blood chemistry: Secondary | ICD-10-CM | POA: Diagnosis not present

## 2023-07-02 DIAGNOSIS — N1330 Unspecified hydronephrosis: Secondary | ICD-10-CM | POA: Diagnosis not present

## 2023-07-02 DIAGNOSIS — D84821 Immunodeficiency due to drugs: Secondary | ICD-10-CM | POA: Diagnosis not present

## 2023-07-02 DIAGNOSIS — R112 Nausea with vomiting, unspecified: Secondary | ICD-10-CM | POA: Diagnosis not present

## 2023-07-02 DIAGNOSIS — Z992 Dependence on renal dialysis: Secondary | ICD-10-CM | POA: Diagnosis not present

## 2023-07-02 DIAGNOSIS — T8619 Other complication of kidney transplant: Secondary | ICD-10-CM | POA: Diagnosis not present

## 2023-07-02 DIAGNOSIS — I517 Cardiomegaly: Secondary | ICD-10-CM | POA: Diagnosis not present

## 2023-07-02 DIAGNOSIS — Z1152 Encounter for screening for COVID-19: Secondary | ICD-10-CM | POA: Diagnosis not present

## 2023-07-02 DIAGNOSIS — Z7682 Awaiting organ transplant status: Secondary | ICD-10-CM | POA: Diagnosis not present

## 2023-07-02 DIAGNOSIS — R918 Other nonspecific abnormal finding of lung field: Secondary | ICD-10-CM | POA: Diagnosis not present

## 2023-07-02 DIAGNOSIS — I739 Peripheral vascular disease, unspecified: Secondary | ICD-10-CM | POA: Diagnosis not present

## 2023-07-02 DIAGNOSIS — Z01818 Encounter for other preprocedural examination: Secondary | ICD-10-CM | POA: Diagnosis not present

## 2023-07-02 DIAGNOSIS — D849 Immunodeficiency, unspecified: Secondary | ICD-10-CM | POA: Diagnosis not present

## 2023-07-02 DIAGNOSIS — D631 Anemia in chronic kidney disease: Secondary | ICD-10-CM | POA: Diagnosis not present

## 2023-07-02 DIAGNOSIS — J811 Chronic pulmonary edema: Secondary | ICD-10-CM | POA: Diagnosis not present

## 2023-07-02 DIAGNOSIS — I129 Hypertensive chronic kidney disease with stage 1 through stage 4 chronic kidney disease, or unspecified chronic kidney disease: Secondary | ICD-10-CM | POA: Diagnosis not present

## 2023-07-02 DIAGNOSIS — Z882 Allergy status to sulfonamides status: Secondary | ICD-10-CM | POA: Diagnosis not present

## 2023-07-02 DIAGNOSIS — Z79621 Long term (current) use of calcineurin inhibitor: Secondary | ICD-10-CM | POA: Diagnosis not present

## 2023-07-02 DIAGNOSIS — Z79899 Other long term (current) drug therapy: Secondary | ICD-10-CM | POA: Diagnosis not present

## 2023-07-03 DIAGNOSIS — Z940 Kidney transplant status: Secondary | ICD-10-CM | POA: Diagnosis not present

## 2023-07-03 DIAGNOSIS — N1330 Unspecified hydronephrosis: Secondary | ICD-10-CM | POA: Diagnosis not present

## 2023-07-03 DIAGNOSIS — R7989 Other specified abnormal findings of blood chemistry: Secondary | ICD-10-CM | POA: Diagnosis not present

## 2023-07-03 DIAGNOSIS — J810 Acute pulmonary edema: Secondary | ICD-10-CM | POA: Diagnosis not present

## 2023-07-03 DIAGNOSIS — Z79621 Long term (current) use of calcineurin inhibitor: Secondary | ICD-10-CM | POA: Diagnosis not present

## 2023-07-03 DIAGNOSIS — D84821 Immunodeficiency due to drugs: Secondary | ICD-10-CM | POA: Diagnosis not present

## 2023-07-03 DIAGNOSIS — R112 Nausea with vomiting, unspecified: Secondary | ICD-10-CM | POA: Diagnosis not present

## 2023-07-04 DIAGNOSIS — N186 End stage renal disease: Secondary | ICD-10-CM | POA: Diagnosis not present

## 2023-07-04 DIAGNOSIS — Z940 Kidney transplant status: Secondary | ICD-10-CM | POA: Diagnosis not present

## 2023-07-04 DIAGNOSIS — D631 Anemia in chronic kidney disease: Secondary | ICD-10-CM | POA: Diagnosis not present

## 2023-07-04 DIAGNOSIS — Z79899 Other long term (current) drug therapy: Secondary | ICD-10-CM | POA: Diagnosis not present

## 2023-07-04 DIAGNOSIS — I739 Peripheral vascular disease, unspecified: Secondary | ICD-10-CM | POA: Diagnosis not present

## 2023-07-04 DIAGNOSIS — Z5181 Encounter for therapeutic drug level monitoring: Secondary | ICD-10-CM | POA: Diagnosis not present

## 2023-07-04 DIAGNOSIS — M3214 Glomerular disease in systemic lupus erythematosus: Secondary | ICD-10-CM | POA: Diagnosis not present

## 2023-07-04 DIAGNOSIS — J811 Chronic pulmonary edema: Secondary | ICD-10-CM | POA: Diagnosis not present

## 2023-07-04 DIAGNOSIS — Z992 Dependence on renal dialysis: Secondary | ICD-10-CM | POA: Diagnosis not present

## 2023-07-05 DIAGNOSIS — J811 Chronic pulmonary edema: Secondary | ICD-10-CM | POA: Diagnosis not present

## 2023-07-05 DIAGNOSIS — Z940 Kidney transplant status: Secondary | ICD-10-CM | POA: Diagnosis not present

## 2023-07-05 DIAGNOSIS — Z992 Dependence on renal dialysis: Secondary | ICD-10-CM | POA: Diagnosis not present

## 2023-07-05 DIAGNOSIS — I517 Cardiomegaly: Secondary | ICD-10-CM | POA: Diagnosis not present

## 2023-07-05 DIAGNOSIS — I10 Essential (primary) hypertension: Secondary | ICD-10-CM | POA: Diagnosis not present

## 2023-07-05 DIAGNOSIS — R918 Other nonspecific abnormal finding of lung field: Secondary | ICD-10-CM | POA: Diagnosis not present

## 2023-07-05 DIAGNOSIS — R000 Tachycardia, unspecified: Secondary | ICD-10-CM | POA: Diagnosis not present

## 2023-07-05 DIAGNOSIS — D631 Anemia in chronic kidney disease: Secondary | ICD-10-CM | POA: Diagnosis not present

## 2023-07-05 DIAGNOSIS — N186 End stage renal disease: Secondary | ICD-10-CM | POA: Diagnosis not present

## 2023-07-06 DIAGNOSIS — D84821 Immunodeficiency due to drugs: Secondary | ICD-10-CM | POA: Diagnosis not present

## 2023-07-06 DIAGNOSIS — Z79899 Other long term (current) drug therapy: Secondary | ICD-10-CM | POA: Diagnosis not present

## 2023-07-06 DIAGNOSIS — Z79621 Long term (current) use of calcineurin inhibitor: Secondary | ICD-10-CM | POA: Diagnosis not present

## 2023-07-06 DIAGNOSIS — Z940 Kidney transplant status: Secondary | ICD-10-CM | POA: Diagnosis not present

## 2023-07-06 DIAGNOSIS — Z5181 Encounter for therapeutic drug level monitoring: Secondary | ICD-10-CM | POA: Diagnosis not present

## 2023-07-06 DIAGNOSIS — T8619 Other complication of kidney transplant: Secondary | ICD-10-CM | POA: Diagnosis not present

## 2023-07-06 DIAGNOSIS — I10 Essential (primary) hypertension: Secondary | ICD-10-CM | POA: Diagnosis not present

## 2023-07-07 DIAGNOSIS — I10 Essential (primary) hypertension: Secondary | ICD-10-CM | POA: Diagnosis not present

## 2023-07-07 DIAGNOSIS — Z992 Dependence on renal dialysis: Secondary | ICD-10-CM | POA: Diagnosis not present

## 2023-07-07 DIAGNOSIS — N1330 Unspecified hydronephrosis: Secondary | ICD-10-CM | POA: Diagnosis not present

## 2023-07-07 DIAGNOSIS — Z940 Kidney transplant status: Secondary | ICD-10-CM | POA: Diagnosis not present

## 2023-07-07 DIAGNOSIS — Z5181 Encounter for therapeutic drug level monitoring: Secondary | ICD-10-CM | POA: Diagnosis not present

## 2023-07-07 DIAGNOSIS — N186 End stage renal disease: Secondary | ICD-10-CM | POA: Diagnosis not present

## 2023-07-07 DIAGNOSIS — D631 Anemia in chronic kidney disease: Secondary | ICD-10-CM | POA: Diagnosis not present

## 2023-07-07 DIAGNOSIS — Z79899 Other long term (current) drug therapy: Secondary | ICD-10-CM | POA: Diagnosis not present

## 2023-07-08 DIAGNOSIS — Z940 Kidney transplant status: Secondary | ICD-10-CM | POA: Diagnosis not present

## 2023-07-08 DIAGNOSIS — T8619 Other complication of kidney transplant: Secondary | ICD-10-CM | POA: Diagnosis not present

## 2023-07-08 DIAGNOSIS — D84821 Immunodeficiency due to drugs: Secondary | ICD-10-CM | POA: Diagnosis not present

## 2023-07-08 DIAGNOSIS — Z79899 Other long term (current) drug therapy: Secondary | ICD-10-CM | POA: Diagnosis not present

## 2023-07-08 DIAGNOSIS — Z5181 Encounter for therapeutic drug level monitoring: Secondary | ICD-10-CM | POA: Diagnosis not present

## 2023-07-08 DIAGNOSIS — Z79621 Long term (current) use of calcineurin inhibitor: Secondary | ICD-10-CM | POA: Diagnosis not present

## 2023-07-10 ENCOUNTER — Other Ambulatory Visit
Admission: RE | Admit: 2023-07-10 | Discharge: 2023-07-10 | Disposition: A | Discharge status: Home / Self Care | Payer: 59 | Attending: Nephrology | Admitting: Nephrology

## 2023-07-10 DIAGNOSIS — Z09 Encounter for follow-up examination after completed treatment for conditions other than malignant neoplasm: Secondary | ICD-10-CM | POA: Insufficient documentation

## 2023-07-10 DIAGNOSIS — T8610 Unspecified complication of kidney transplant: Secondary | ICD-10-CM | POA: Insufficient documentation

## 2023-07-10 DIAGNOSIS — Z79899 Other long term (current) drug therapy: Secondary | ICD-10-CM | POA: Insufficient documentation

## 2023-07-10 DIAGNOSIS — Z9483 Pancreas transplant status: Secondary | ICD-10-CM | POA: Diagnosis not present

## 2023-07-10 DIAGNOSIS — E559 Vitamin D deficiency, unspecified: Secondary | ICD-10-CM | POA: Insufficient documentation

## 2023-07-10 DIAGNOSIS — R93421 Abnormal radiologic findings on diagnostic imaging of right kidney: Secondary | ICD-10-CM | POA: Diagnosis not present

## 2023-07-10 DIAGNOSIS — D631 Anemia in chronic kidney disease: Secondary | ICD-10-CM | POA: Diagnosis not present

## 2023-07-10 DIAGNOSIS — Z789 Other specified health status: Secondary | ICD-10-CM | POA: Insufficient documentation

## 2023-07-10 DIAGNOSIS — N390 Urinary tract infection, site not specified: Secondary | ICD-10-CM | POA: Insufficient documentation

## 2023-07-10 DIAGNOSIS — D899 Disorder involving the immune mechanism, unspecified: Secondary | ICD-10-CM | POA: Insufficient documentation

## 2023-07-10 DIAGNOSIS — B259 Cytomegaloviral disease, unspecified: Secondary | ICD-10-CM | POA: Diagnosis not present

## 2023-07-10 DIAGNOSIS — Z940 Kidney transplant status: Secondary | ICD-10-CM | POA: Insufficient documentation

## 2023-07-10 DIAGNOSIS — E1129 Type 2 diabetes mellitus with other diabetic kidney complication: Secondary | ICD-10-CM | POA: Insufficient documentation

## 2023-07-10 DIAGNOSIS — Z114 Encounter for screening for human immunodeficiency virus [HIV]: Secondary | ICD-10-CM | POA: Insufficient documentation

## 2023-07-10 DIAGNOSIS — X58XXXA Exposure to other specified factors, initial encounter: Secondary | ICD-10-CM | POA: Diagnosis not present

## 2023-07-10 LAB — CBC WITH DIFFERENTIAL/PLATELET
Abs Immature Granulocytes: 0.1 10*3/uL — ABNORMAL HIGH (ref 0.00–0.07)
Basophils Absolute: 0 10*3/uL (ref 0.0–0.1)
Basophils Relative: 0 %
Eosinophils Absolute: 0.3 10*3/uL (ref 0.0–0.5)
Eosinophils Relative: 6 %
HCT: 24.7 % — ABNORMAL LOW (ref 36.0–46.0)
Hemoglobin: 7.9 g/dL — ABNORMAL LOW (ref 12.0–15.0)
Immature Granulocytes: 2 %
Lymphocytes Relative: 3 %
Lymphs Abs: 0.1 10*3/uL — ABNORMAL LOW (ref 0.7–4.0)
MCH: 23.9 pg — ABNORMAL LOW (ref 26.0–34.0)
MCHC: 32 g/dL (ref 30.0–36.0)
MCV: 74.6 fL — ABNORMAL LOW (ref 80.0–100.0)
Monocytes Absolute: 0.5 10*3/uL (ref 0.1–1.0)
Monocytes Relative: 11 %
Neutro Abs: 3.7 10*3/uL (ref 1.7–7.7)
Neutrophils Relative %: 78 %
Platelets: 202 10*3/uL (ref 150–400)
RBC: 3.31 MIL/uL — ABNORMAL LOW (ref 3.87–5.11)
RDW: 23 % — ABNORMAL HIGH (ref 11.5–15.5)
Smear Review: NORMAL
WBC: 4.7 10*3/uL (ref 4.0–10.5)
nRBC: 0 % (ref 0.0–0.2)

## 2023-07-10 LAB — ALT: ALT: 8 U/L (ref 0–44)

## 2023-07-10 LAB — BASIC METABOLIC PANEL
Anion gap: 10 (ref 5–15)
BUN: 24 mg/dL — ABNORMAL HIGH (ref 8–23)
CO2: 26 mmol/L (ref 22–32)
Calcium: 7.7 mg/dL — ABNORMAL LOW (ref 8.9–10.3)
Chloride: 96 mmol/L — ABNORMAL LOW (ref 98–111)
Creatinine, Ser: 2.41 mg/dL — ABNORMAL HIGH (ref 0.44–1.00)
GFR, Estimated: 21 mL/min — ABNORMAL LOW (ref >60)
Glucose, Bld: 87 mg/dL (ref 70–99)
Potassium: 3.3 mmol/L — ABNORMAL LOW (ref 3.5–5.1)
Sodium: 132 mmol/L — ABNORMAL LOW (ref 135–145)

## 2023-07-10 LAB — GAMMA GT: GGT: 47 U/L (ref 7–50)

## 2023-07-10 LAB — ALBUMIN: Albumin: 2.8 g/dL — ABNORMAL LOW (ref 3.5–5.0)

## 2023-07-10 LAB — BILIRUBIN, TOTAL: Total Bilirubin: 1.6 mg/dL — ABNORMAL HIGH (ref 0.3–1.2)

## 2023-07-10 LAB — AST: AST: 24 U/L (ref 15–41)

## 2023-07-10 LAB — PHOSPHORUS: Phosphorus: 2.6 mg/dL (ref 2.5–4.6)

## 2023-07-10 LAB — MAGNESIUM: Magnesium: 1.8 mg/dL (ref 1.7–2.4)

## 2023-07-10 LAB — ALKALINE PHOSPHATASE: Alkaline Phosphatase: 44 U/L (ref 38–126)

## 2023-07-11 LAB — HCV RNA QUANT RFLX ULTRA OR GENOTYP
HCV RNA Qnt(log copy/mL): UNDETERMINED {Log_IU}/mL
HepC Qn: NOT DETECTED [IU]/mL

## 2023-07-13 DIAGNOSIS — Z9489 Other transplanted organ and tissue status: Secondary | ICD-10-CM | POA: Diagnosis not present

## 2023-07-13 DIAGNOSIS — M3214 Glomerular disease in systemic lupus erythematosus: Secondary | ICD-10-CM | POA: Diagnosis not present

## 2023-07-13 DIAGNOSIS — Z79899 Other long term (current) drug therapy: Secondary | ICD-10-CM | POA: Diagnosis not present

## 2023-07-13 DIAGNOSIS — Z940 Kidney transplant status: Secondary | ICD-10-CM | POA: Diagnosis not present

## 2023-07-13 DIAGNOSIS — D649 Anemia, unspecified: Secondary | ICD-10-CM | POA: Diagnosis not present

## 2023-07-13 LAB — HEPATITIS C GENOTYPE

## 2023-07-13 NOTE — Group Note (Deleted)

## 2023-07-14 LAB — TACROLIMUS LEVEL: Tacrolimus (FK506) - LabCorp: 7.8 ng/mL (ref 2.0–20.0)

## 2023-07-15 DIAGNOSIS — D631 Anemia in chronic kidney disease: Secondary | ICD-10-CM | POA: Diagnosis not present

## 2023-07-15 DIAGNOSIS — D649 Anemia, unspecified: Secondary | ICD-10-CM | POA: Diagnosis not present

## 2023-07-15 DIAGNOSIS — N185 Chronic kidney disease, stage 5: Secondary | ICD-10-CM | POA: Diagnosis not present

## 2023-07-15 DIAGNOSIS — Z940 Kidney transplant status: Secondary | ICD-10-CM | POA: Diagnosis not present

## 2023-07-20 ENCOUNTER — Other Ambulatory Visit
Admission: RE | Admit: 2023-07-20 | Discharge: 2023-07-20 | Disposition: A | Discharge status: Home / Self Care | Payer: 59 | Attending: Nephrology | Admitting: Nephrology

## 2023-07-20 DIAGNOSIS — B259 Cytomegaloviral disease, unspecified: Secondary | ICD-10-CM | POA: Insufficient documentation

## 2023-07-20 DIAGNOSIS — E559 Vitamin D deficiency, unspecified: Secondary | ICD-10-CM | POA: Insufficient documentation

## 2023-07-20 DIAGNOSIS — Z114 Encounter for screening for human immunodeficiency virus [HIV]: Secondary | ICD-10-CM | POA: Diagnosis not present

## 2023-07-20 DIAGNOSIS — Z944 Liver transplant status: Secondary | ICD-10-CM | POA: Diagnosis not present

## 2023-07-20 DIAGNOSIS — Z09 Encounter for follow-up examination after completed treatment for conditions other than malignant neoplasm: Secondary | ICD-10-CM | POA: Diagnosis not present

## 2023-07-20 DIAGNOSIS — Z79899 Other long term (current) drug therapy: Secondary | ICD-10-CM | POA: Insufficient documentation

## 2023-07-20 DIAGNOSIS — T8610 Unspecified complication of kidney transplant: Secondary | ICD-10-CM | POA: Diagnosis not present

## 2023-07-20 DIAGNOSIS — D631 Anemia in chronic kidney disease: Secondary | ICD-10-CM | POA: Diagnosis not present

## 2023-07-20 DIAGNOSIS — Z789 Other specified health status: Secondary | ICD-10-CM | POA: Insufficient documentation

## 2023-07-20 DIAGNOSIS — X58XXXA Exposure to other specified factors, initial encounter: Secondary | ICD-10-CM | POA: Diagnosis not present

## 2023-07-20 DIAGNOSIS — N390 Urinary tract infection, site not specified: Secondary | ICD-10-CM | POA: Diagnosis not present

## 2023-07-20 DIAGNOSIS — Z9483 Pancreas transplant status: Secondary | ICD-10-CM | POA: Insufficient documentation

## 2023-07-20 DIAGNOSIS — D899 Disorder involving the immune mechanism, unspecified: Secondary | ICD-10-CM | POA: Insufficient documentation

## 2023-07-20 LAB — CBC WITH DIFFERENTIAL/PLATELET
Abs Immature Granulocytes: 0.03 10*3/uL (ref 0.00–0.07)
Basophils Absolute: 0.1 10*3/uL (ref 0.0–0.1)
Basophils Relative: 1 %
Eosinophils Absolute: 0.1 10*3/uL (ref 0.0–0.5)
Eosinophils Relative: 2 %
HCT: 31 % — ABNORMAL LOW (ref 36.0–46.0)
Hemoglobin: 9.5 g/dL — ABNORMAL LOW (ref 12.0–15.0)
Immature Granulocytes: 1 %
Lymphocytes Relative: 15 %
Lymphs Abs: 0.8 10*3/uL (ref 0.7–4.0)
MCH: 23.9 pg — ABNORMAL LOW (ref 26.0–34.0)
MCHC: 30.6 g/dL (ref 30.0–36.0)
MCV: 78.1 fL — ABNORMAL LOW (ref 80.0–100.0)
Monocytes Absolute: 0.6 10*3/uL (ref 0.1–1.0)
Monocytes Relative: 11 %
Neutro Abs: 3.7 10*3/uL (ref 1.7–7.7)
Neutrophils Relative %: 70 %
Platelets: 346 10*3/uL (ref 150–400)
RBC: 3.97 MIL/uL (ref 3.87–5.11)
RDW: 24.1 % — ABNORMAL HIGH (ref 11.5–15.5)
WBC: 5.3 10*3/uL (ref 4.0–10.5)
nRBC: 0 % (ref 0.0–0.2)

## 2023-07-20 LAB — BASIC METABOLIC PANEL WITH GFR
Anion gap: 9 (ref 5–15)
BUN: 31 mg/dL — ABNORMAL HIGH (ref 8–23)
CO2: 23 mmol/L (ref 22–32)
Calcium: 9.2 mg/dL (ref 8.9–10.3)
Chloride: 103 mmol/L (ref 98–111)
Creatinine, Ser: 1.12 mg/dL — ABNORMAL HIGH (ref 0.44–1.00)
GFR, Estimated: 53 mL/min — ABNORMAL LOW
Glucose, Bld: 92 mg/dL (ref 70–99)
Potassium: 5.2 mmol/L — ABNORMAL HIGH (ref 3.5–5.1)
Sodium: 135 mmol/L (ref 135–145)

## 2023-07-20 LAB — AST: AST: 22 U/L (ref 15–41)

## 2023-07-20 LAB — GAMMA GT: GGT: 45 U/L (ref 7–50)

## 2023-07-20 LAB — BILIRUBIN, TOTAL: Total Bilirubin: 1.5 mg/dL — ABNORMAL HIGH (ref 0.3–1.2)

## 2023-07-20 LAB — ALKALINE PHOSPHATASE: Alkaline Phosphatase: 70 U/L (ref 38–126)

## 2023-07-20 LAB — ALBUMIN: Albumin: 3.9 g/dL (ref 3.5–5.0)

## 2023-07-20 LAB — ALT: ALT: 13 U/L (ref 0–44)

## 2023-07-20 LAB — MAGNESIUM: Magnesium: 2.1 mg/dL (ref 1.7–2.4)

## 2023-07-20 LAB — PHOSPHORUS: Phosphorus: 3.8 mg/dL (ref 2.5–4.6)

## 2023-07-21 LAB — HCV RNA QUANT: HCV Quantitative: NOT DETECTED [IU]/mL (ref >50)

## 2023-07-22 ENCOUNTER — Other Ambulatory Visit
Admission: RE | Admit: 2023-07-22 | Discharge: 2023-07-22 | Disposition: A | Discharge status: Home / Self Care | Payer: 59 | Source: Ambulatory Visit | Attending: Nephrology | Admitting: Nephrology

## 2023-07-22 DIAGNOSIS — Z09 Encounter for follow-up examination after completed treatment for conditions other than malignant neoplasm: Secondary | ICD-10-CM | POA: Insufficient documentation

## 2023-07-22 DIAGNOSIS — Z9483 Pancreas transplant status: Secondary | ICD-10-CM | POA: Insufficient documentation

## 2023-07-22 DIAGNOSIS — N390 Urinary tract infection, site not specified: Secondary | ICD-10-CM | POA: Insufficient documentation

## 2023-07-22 DIAGNOSIS — B259 Cytomegaloviral disease, unspecified: Secondary | ICD-10-CM | POA: Insufficient documentation

## 2023-07-22 DIAGNOSIS — B9689 Other specified bacterial agents as the cause of diseases classified elsewhere: Secondary | ICD-10-CM | POA: Insufficient documentation

## 2023-07-22 DIAGNOSIS — D899 Disorder involving the immune mechanism, unspecified: Secondary | ICD-10-CM | POA: Diagnosis not present

## 2023-07-22 DIAGNOSIS — Z79899 Other long term (current) drug therapy: Secondary | ICD-10-CM | POA: Insufficient documentation

## 2023-07-22 DIAGNOSIS — E559 Vitamin D deficiency, unspecified: Secondary | ICD-10-CM | POA: Diagnosis not present

## 2023-07-22 DIAGNOSIS — Z940 Kidney transplant status: Secondary | ICD-10-CM | POA: Diagnosis not present

## 2023-07-22 DIAGNOSIS — Z114 Encounter for screening for human immunodeficiency virus [HIV]: Secondary | ICD-10-CM | POA: Diagnosis not present

## 2023-07-22 DIAGNOSIS — D631 Anemia in chronic kidney disease: Secondary | ICD-10-CM | POA: Insufficient documentation

## 2023-07-22 DIAGNOSIS — E1129 Type 2 diabetes mellitus with other diabetic kidney complication: Secondary | ICD-10-CM | POA: Diagnosis not present

## 2023-07-22 DIAGNOSIS — Z789 Other specified health status: Secondary | ICD-10-CM | POA: Insufficient documentation

## 2023-07-22 LAB — CBC WITH DIFFERENTIAL/PLATELET
Abs Immature Granulocytes: 0.01 10*3/uL (ref 0.00–0.07)
Basophils Absolute: 0.1 10*3/uL (ref 0.0–0.1)
Basophils Relative: 1 %
Eosinophils Absolute: 0.1 10*3/uL (ref 0.0–0.5)
Eosinophils Relative: 2 %
HCT: 34.1 % — ABNORMAL LOW (ref 36.0–46.0)
Hemoglobin: 10.2 g/dL — ABNORMAL LOW (ref 12.0–15.0)
Immature Granulocytes: 0 %
Lymphocytes Relative: 18 %
Lymphs Abs: 0.8 10*3/uL (ref 0.7–4.0)
MCH: 23.6 pg — ABNORMAL LOW (ref 26.0–34.0)
MCHC: 29.9 g/dL — ABNORMAL LOW (ref 30.0–36.0)
MCV: 78.8 fL — ABNORMAL LOW (ref 80.0–100.0)
Monocytes Absolute: 0.5 10*3/uL (ref 0.1–1.0)
Monocytes Relative: 12 %
Neutro Abs: 2.9 10*3/uL (ref 1.7–7.7)
Neutrophils Relative %: 67 %
Platelets: 402 10*3/uL — ABNORMAL HIGH (ref 150–400)
RBC: 4.33 MIL/uL (ref 3.87–5.11)
RDW: 24.9 % — ABNORMAL HIGH (ref 11.5–15.5)
Smear Review: NORMAL
WBC: 4.3 10*3/uL (ref 4.0–10.5)
nRBC: 0 % (ref 0.0–0.2)

## 2023-07-22 LAB — BASIC METABOLIC PANEL WITH GFR
Anion gap: 11 (ref 5–15)
BUN: 28 mg/dL — ABNORMAL HIGH (ref 8–23)
CO2: 22 mmol/L (ref 22–32)
Calcium: 9.1 mg/dL (ref 8.9–10.3)
Chloride: 103 mmol/L (ref 98–111)
Creatinine, Ser: 1.16 mg/dL — ABNORMAL HIGH (ref 0.44–1.00)
GFR, Estimated: 51 mL/min — ABNORMAL LOW (ref >60)
Glucose, Bld: 95 mg/dL (ref 70–99)
Potassium: 5 mmol/L (ref 3.5–5.1)
Sodium: 136 mmol/L (ref 135–145)

## 2023-07-22 LAB — PHOSPHORUS: Phosphorus: 3.7 mg/dL (ref 2.5–4.6)

## 2023-07-22 LAB — MAGNESIUM: Magnesium: 2.1 mg/dL (ref 1.7–2.4)

## 2023-07-24 ENCOUNTER — Other Ambulatory Visit
Admission: RE | Admit: 2023-07-24 | Discharge: 2023-07-24 | Disposition: A | Discharge status: Home / Self Care | Payer: 59 | Attending: Nephrology | Admitting: Nephrology

## 2023-07-24 DIAGNOSIS — Z114 Encounter for screening for human immunodeficiency virus [HIV]: Secondary | ICD-10-CM | POA: Diagnosis not present

## 2023-07-24 DIAGNOSIS — Z79899 Other long term (current) drug therapy: Secondary | ICD-10-CM | POA: Diagnosis not present

## 2023-07-24 DIAGNOSIS — Z09 Encounter for follow-up examination after completed treatment for conditions other than malignant neoplasm: Secondary | ICD-10-CM | POA: Insufficient documentation

## 2023-07-24 DIAGNOSIS — D631 Anemia in chronic kidney disease: Secondary | ICD-10-CM | POA: Insufficient documentation

## 2023-07-24 DIAGNOSIS — Z9483 Pancreas transplant status: Secondary | ICD-10-CM | POA: Diagnosis not present

## 2023-07-24 DIAGNOSIS — B259 Cytomegaloviral disease, unspecified: Secondary | ICD-10-CM | POA: Insufficient documentation

## 2023-07-24 DIAGNOSIS — Z789 Other specified health status: Secondary | ICD-10-CM | POA: Diagnosis not present

## 2023-07-24 DIAGNOSIS — E559 Vitamin D deficiency, unspecified: Secondary | ICD-10-CM | POA: Insufficient documentation

## 2023-07-24 DIAGNOSIS — Z940 Kidney transplant status: Secondary | ICD-10-CM | POA: Insufficient documentation

## 2023-07-24 DIAGNOSIS — D8489 Other immunodeficiencies: Secondary | ICD-10-CM | POA: Insufficient documentation

## 2023-07-24 DIAGNOSIS — E1122 Type 2 diabetes mellitus with diabetic chronic kidney disease: Secondary | ICD-10-CM | POA: Insufficient documentation

## 2023-07-24 DIAGNOSIS — N189 Chronic kidney disease, unspecified: Secondary | ICD-10-CM | POA: Diagnosis not present

## 2023-07-24 LAB — CBC WITH DIFFERENTIAL/PLATELET
Abs Immature Granulocytes: 0.03 10*3/uL (ref 0.00–0.07)
Basophils Absolute: 0.1 10*3/uL (ref 0.0–0.1)
Basophils Relative: 2 %
Eosinophils Absolute: 0.1 10*3/uL (ref 0.0–0.5)
Eosinophils Relative: 3 %
HCT: 34 % — ABNORMAL LOW (ref 36.0–46.0)
Hemoglobin: 10.2 g/dL — ABNORMAL LOW (ref 12.0–15.0)
Immature Granulocytes: 1 %
Lymphocytes Relative: 21 %
Lymphs Abs: 0.8 10*3/uL (ref 0.7–4.0)
MCH: 23.8 pg — ABNORMAL LOW (ref 26.0–34.0)
MCHC: 30 g/dL (ref 30.0–36.0)
MCV: 79.3 fL — ABNORMAL LOW (ref 80.0–100.0)
Monocytes Absolute: 0.4 10*3/uL (ref 0.1–1.0)
Monocytes Relative: 9 %
Neutro Abs: 2.5 10*3/uL (ref 1.7–7.7)
Neutrophils Relative %: 64 %
Platelets: 410 10*3/uL — ABNORMAL HIGH (ref 150–400)
RBC: 4.29 MIL/uL (ref 3.87–5.11)
RDW: 24.6 % — ABNORMAL HIGH (ref 11.5–15.5)
WBC: 3.8 10*3/uL — ABNORMAL LOW (ref 4.0–10.5)
nRBC: 0 % (ref 0.0–0.2)

## 2023-07-24 LAB — BASIC METABOLIC PANEL WITH GFR
Anion gap: 11 (ref 5–15)
BUN: 23 mg/dL (ref 8–23)
CO2: 24 mmol/L (ref 22–32)
Calcium: 9.5 mg/dL (ref 8.9–10.3)
Chloride: 103 mmol/L (ref 98–111)
Creatinine, Ser: 0.86 mg/dL (ref 0.44–1.00)
GFR, Estimated: >60 mL/min
Glucose, Bld: 74 mg/dL (ref 70–99)
Potassium: 5.2 mmol/L — ABNORMAL HIGH (ref 3.5–5.1)
Sodium: 138 mmol/L (ref 135–145)

## 2023-07-24 LAB — HEPATITIS C GENOTYPE

## 2023-07-24 LAB — PHOSPHORUS: Phosphorus: 3.3 mg/dL (ref 2.5–4.6)

## 2023-07-24 LAB — MAGNESIUM: Magnesium: 1.9 mg/dL (ref 1.7–2.4)

## 2023-07-24 LAB — TACROLIMUS LEVEL: Tacrolimus (FK506) - LabCorp: 10.3 ng/mL (ref 2.0–20.0)

## 2023-07-27 ENCOUNTER — Other Ambulatory Visit
Admission: RE | Admit: 2023-07-27 | Discharge: 2023-07-27 | Disposition: A | Discharge status: Home / Self Care | Payer: 59 | Attending: Nephrology | Admitting: Nephrology

## 2023-07-27 DIAGNOSIS — Z789 Other specified health status: Secondary | ICD-10-CM | POA: Diagnosis not present

## 2023-07-27 DIAGNOSIS — Z79899 Other long term (current) drug therapy: Secondary | ICD-10-CM | POA: Diagnosis not present

## 2023-07-27 DIAGNOSIS — E1129 Type 2 diabetes mellitus with other diabetic kidney complication: Secondary | ICD-10-CM | POA: Insufficient documentation

## 2023-07-27 DIAGNOSIS — Z09 Encounter for follow-up examination after completed treatment for conditions other than malignant neoplasm: Secondary | ICD-10-CM | POA: Insufficient documentation

## 2023-07-27 DIAGNOSIS — D631 Anemia in chronic kidney disease: Secondary | ICD-10-CM | POA: Insufficient documentation

## 2023-07-27 DIAGNOSIS — N390 Urinary tract infection, site not specified: Secondary | ICD-10-CM | POA: Diagnosis not present

## 2023-07-27 DIAGNOSIS — Z0000 Encounter for general adult medical examination without abnormal findings: Secondary | ICD-10-CM | POA: Diagnosis not present

## 2023-07-27 DIAGNOSIS — N186 End stage renal disease: Secondary | ICD-10-CM | POA: Diagnosis not present

## 2023-07-27 DIAGNOSIS — E559 Vitamin D deficiency, unspecified: Secondary | ICD-10-CM | POA: Diagnosis not present

## 2023-07-27 DIAGNOSIS — D84821 Immunodeficiency due to drugs: Secondary | ICD-10-CM | POA: Diagnosis not present

## 2023-07-27 DIAGNOSIS — I120 Hypertensive chronic kidney disease with stage 5 chronic kidney disease or end stage renal disease: Secondary | ICD-10-CM | POA: Diagnosis not present

## 2023-07-27 DIAGNOSIS — D899 Disorder involving the immune mechanism, unspecified: Secondary | ICD-10-CM | POA: Diagnosis not present

## 2023-07-27 DIAGNOSIS — Z940 Kidney transplant status: Secondary | ICD-10-CM | POA: Diagnosis not present

## 2023-07-27 DIAGNOSIS — I700 Atherosclerosis of aorta: Secondary | ICD-10-CM | POA: Diagnosis not present

## 2023-07-27 DIAGNOSIS — M3214 Glomerular disease in systemic lupus erythematosus: Secondary | ICD-10-CM | POA: Diagnosis not present

## 2023-07-27 DIAGNOSIS — Z114 Encounter for screening for human immunodeficiency virus [HIV]: Secondary | ICD-10-CM | POA: Insufficient documentation

## 2023-07-27 LAB — CBC WITH DIFFERENTIAL/PLATELET
Abs Immature Granulocytes: 0.02 10*3/uL (ref 0.00–0.07)
Basophils Absolute: 0.1 10*3/uL (ref 0.0–0.1)
Basophils Relative: 1 %
Eosinophils Absolute: 0.1 10*3/uL (ref 0.0–0.5)
Eosinophils Relative: 3 %
HCT: 36.3 % (ref 36.0–46.0)
Hemoglobin: 11.3 g/dL — ABNORMAL LOW (ref 12.0–15.0)
Immature Granulocytes: 1 %
Lymphocytes Relative: 20 %
Lymphs Abs: 0.8 10*3/uL (ref 0.7–4.0)
MCH: 24.2 pg — ABNORMAL LOW (ref 26.0–34.0)
MCHC: 31.1 g/dL (ref 30.0–36.0)
MCV: 77.7 fL — ABNORMAL LOW (ref 80.0–100.0)
Monocytes Absolute: 0.3 10*3/uL (ref 0.1–1.0)
Monocytes Relative: 7 %
Neutro Abs: 2.6 10*3/uL (ref 1.7–7.7)
Neutrophils Relative %: 68 %
Platelets: 416 10*3/uL — ABNORMAL HIGH (ref 150–400)
RBC: 4.67 MIL/uL (ref 3.87–5.11)
RDW: 24.9 % — ABNORMAL HIGH (ref 11.5–15.5)
Smear Review: NORMAL
WBC: 3.9 10*3/uL — ABNORMAL LOW (ref 4.0–10.5)
nRBC: 0 % (ref 0.0–0.2)

## 2023-07-27 LAB — BASIC METABOLIC PANEL
Anion gap: 10 (ref 5–15)
BUN: 20 mg/dL (ref 8–23)
CO2: 24 mmol/L (ref 22–32)
Calcium: 9.8 mg/dL (ref 8.9–10.3)
Chloride: 103 mmol/L (ref 98–111)
Creatinine, Ser: 0.96 mg/dL (ref 0.44–1.00)
GFR, Estimated: >60 mL/min (ref >60)
Glucose, Bld: 99 mg/dL (ref 70–99)
Potassium: 4.7 mmol/L (ref 3.5–5.1)
Sodium: 137 mmol/L (ref 135–145)

## 2023-07-27 LAB — PHOSPHORUS: Phosphorus: 3.5 mg/dL (ref 2.5–4.6)

## 2023-07-27 LAB — TACROLIMUS LEVEL: Tacrolimus (FK506) - LabCorp: 9.7 ng/mL (ref 2.0–20.0)

## 2023-07-27 LAB — ALT: ALT: 15 U/L (ref 0–44)

## 2023-07-27 LAB — BILIRUBIN, TOTAL: Total Bilirubin: 1.7 mg/dL — ABNORMAL HIGH (ref 0.3–1.2)

## 2023-07-27 LAB — ALBUMIN: Albumin: 4.2 g/dL (ref 3.5–5.0)

## 2023-07-27 LAB — AST: AST: 28 U/L (ref 15–41)

## 2023-07-27 LAB — ALKALINE PHOSPHATASE: Alkaline Phosphatase: 86 U/L (ref 38–126)

## 2023-07-27 LAB — MAGNESIUM: Magnesium: 2 mg/dL (ref 1.7–2.4)

## 2023-07-27 LAB — GAMMA GT: GGT: 60 U/L — ABNORMAL HIGH (ref 7–50)

## 2023-07-28 LAB — TACROLIMUS LEVEL: Tacrolimus (FK506) - LabCorp: 9.2 ng/mL (ref 2.0–20.0)

## 2023-07-28 LAB — HCV RNA QUANT: HCV Quantitative: NOT DETECTED IU/mL (ref >50)

## 2023-07-29 DIAGNOSIS — N186 End stage renal disease: Secondary | ICD-10-CM | POA: Diagnosis not present

## 2023-07-29 DIAGNOSIS — I10 Essential (primary) hypertension: Secondary | ICD-10-CM | POA: Diagnosis not present

## 2023-07-29 DIAGNOSIS — Z9489 Other transplanted organ and tissue status: Secondary | ICD-10-CM | POA: Diagnosis not present

## 2023-07-29 DIAGNOSIS — D849 Immunodeficiency, unspecified: Secondary | ICD-10-CM | POA: Diagnosis not present

## 2023-07-29 DIAGNOSIS — Z940 Kidney transplant status: Secondary | ICD-10-CM | POA: Diagnosis not present

## 2023-07-29 DIAGNOSIS — R809 Proteinuria, unspecified: Secondary | ICD-10-CM | POA: Diagnosis not present

## 2023-07-29 DIAGNOSIS — Z79899 Other long term (current) drug therapy: Secondary | ICD-10-CM | POA: Diagnosis not present

## 2023-07-29 LAB — HEPATITIS C GENOTYPE

## 2023-07-30 ENCOUNTER — Other Ambulatory Visit: Payer: Self-pay | Admitting: Family Medicine

## 2023-07-30 DIAGNOSIS — E2839 Other primary ovarian failure: Secondary | ICD-10-CM

## 2023-08-03 ENCOUNTER — Other Ambulatory Visit (HOSPITAL_COMMUNITY)
Admission: RE | Admit: 2023-08-03 | Discharge: 2023-08-03 | Disposition: A | Discharge status: Home / Self Care | Payer: 59 | Source: Ambulatory Visit | Attending: Nephrology | Admitting: Nephrology

## 2023-08-03 DIAGNOSIS — Z789 Other specified health status: Secondary | ICD-10-CM | POA: Diagnosis not present

## 2023-08-03 DIAGNOSIS — B259 Cytomegaloviral disease, unspecified: Secondary | ICD-10-CM | POA: Insufficient documentation

## 2023-08-03 DIAGNOSIS — Z940 Kidney transplant status: Secondary | ICD-10-CM | POA: Diagnosis not present

## 2023-08-03 DIAGNOSIS — E1129 Type 2 diabetes mellitus with other diabetic kidney complication: Secondary | ICD-10-CM | POA: Diagnosis not present

## 2023-08-03 DIAGNOSIS — Z09 Encounter for follow-up examination after completed treatment for conditions other than malignant neoplasm: Secondary | ICD-10-CM | POA: Insufficient documentation

## 2023-08-03 DIAGNOSIS — E559 Vitamin D deficiency, unspecified: Secondary | ICD-10-CM | POA: Diagnosis not present

## 2023-08-03 DIAGNOSIS — D631 Anemia in chronic kidney disease: Secondary | ICD-10-CM | POA: Insufficient documentation

## 2023-08-03 DIAGNOSIS — Z79899 Other long term (current) drug therapy: Secondary | ICD-10-CM | POA: Insufficient documentation

## 2023-08-03 DIAGNOSIS — D899 Disorder involving the immune mechanism, unspecified: Secondary | ICD-10-CM | POA: Diagnosis not present

## 2023-08-03 DIAGNOSIS — Z9483 Pancreas transplant status: Secondary | ICD-10-CM | POA: Insufficient documentation

## 2023-08-03 DIAGNOSIS — Z114 Encounter for screening for human immunodeficiency virus [HIV]: Secondary | ICD-10-CM | POA: Diagnosis not present

## 2023-08-03 DIAGNOSIS — N390 Urinary tract infection, site not specified: Secondary | ICD-10-CM | POA: Diagnosis not present

## 2023-08-03 DIAGNOSIS — Z4901 Encounter for fitting and adjustment of extracorporeal dialysis catheter: Secondary | ICD-10-CM | POA: Diagnosis not present

## 2023-08-03 LAB — CBC WITH DIFFERENTIAL/PLATELET
Abs Immature Granulocytes: 0.03 10*3/uL (ref 0.00–0.07)
Basophils Absolute: 0 10*3/uL (ref 0.0–0.1)
Basophils Relative: 1 %
Eosinophils Absolute: 0.2 10*3/uL (ref 0.0–0.5)
Eosinophils Relative: 5 %
HCT: 32.1 % — ABNORMAL LOW (ref 36.0–46.0)
Hemoglobin: 9.6 g/dL — ABNORMAL LOW (ref 12.0–15.0)
Immature Granulocytes: 1 %
Lymphocytes Relative: 20 %
Lymphs Abs: 0.7 10*3/uL (ref 0.7–4.0)
MCH: 24.4 pg — ABNORMAL LOW (ref 26.0–34.0)
MCHC: 29.9 g/dL — ABNORMAL LOW (ref 30.0–36.0)
MCV: 81.7 fL (ref 80.0–100.0)
Monocytes Absolute: 0.3 10*3/uL (ref 0.1–1.0)
Monocytes Relative: 9 %
Neutro Abs: 2.3 10*3/uL (ref 1.7–7.7)
Neutrophils Relative %: 64 %
Platelets: 230 10*3/uL (ref 150–400)
RBC: 3.93 MIL/uL (ref 3.87–5.11)
RDW: 23.7 % — ABNORMAL HIGH (ref 11.5–15.5)
WBC: 3.6 10*3/uL — ABNORMAL LOW (ref 4.0–10.5)
nRBC: 0 % (ref 0.0–0.2)

## 2023-08-03 LAB — URINALYSIS, MICROSCOPIC (REFLEX)

## 2023-08-03 LAB — BASIC METABOLIC PANEL
Anion gap: 10 (ref 5–15)
BUN: 34 mg/dL — ABNORMAL HIGH (ref 8–23)
CO2: 23 mmol/L (ref 22–32)
Calcium: 9.5 mg/dL (ref 8.9–10.3)
Chloride: 104 mmol/L (ref 98–111)
Creatinine, Ser: 1.01 mg/dL — ABNORMAL HIGH (ref 0.44–1.00)
GFR, Estimated: >60 mL/min (ref >60)
Glucose, Bld: 101 mg/dL — ABNORMAL HIGH (ref 70–99)
Potassium: 4.6 mmol/L (ref 3.5–5.1)
Sodium: 137 mmol/L (ref 135–145)

## 2023-08-03 LAB — URINALYSIS, ROUTINE W REFLEX MICROSCOPIC
Bilirubin Urine: NEGATIVE
Glucose, UA: NEGATIVE mg/dL
Ketones, ur: NEGATIVE mg/dL
Nitrite: NEGATIVE
Protein, ur: NEGATIVE mg/dL
Specific Gravity, Urine: 1.015 (ref 1.005–1.030)
pH: 6 (ref 5.0–8.0)

## 2023-08-03 LAB — PHOSPHORUS: Phosphorus: 4.3 mg/dL (ref 2.5–4.6)

## 2023-08-03 LAB — PROTEIN / CREATININE RATIO, URINE
Creatinine, Urine: 91 mg/dL
Protein Creatinine Ratio: 0.13 mg/mg{creat} (ref 0.00–0.15)
Total Protein, Urine: 12 mg/dL

## 2023-08-03 LAB — MAGNESIUM: Magnesium: 1.9 mg/dL (ref 1.7–2.4)

## 2023-08-04 DIAGNOSIS — Z940 Kidney transplant status: Secondary | ICD-10-CM | POA: Diagnosis not present

## 2023-08-04 LAB — MICROALBUMIN / CREATININE URINE RATIO
Creatinine, Urine: 88.6 mg/dL
Microalb Creat Ratio: 28 mg/g{creat} (ref 0–29)
Microalb, Ur: 24.7 ug/mL — ABNORMAL HIGH

## 2023-08-05 ENCOUNTER — Other Ambulatory Visit (HOSPITAL_COMMUNITY)
Admission: RE | Admit: 2023-08-05 | Discharge: 2023-08-05 | Disposition: A | Discharge status: Home / Self Care | Payer: 59 | Source: Ambulatory Visit | Attending: Nephrology | Admitting: Nephrology

## 2023-08-05 DIAGNOSIS — E1129 Type 2 diabetes mellitus with other diabetic kidney complication: Secondary | ICD-10-CM | POA: Insufficient documentation

## 2023-08-05 DIAGNOSIS — Z09 Encounter for follow-up examination after completed treatment for conditions other than malignant neoplasm: Secondary | ICD-10-CM | POA: Diagnosis not present

## 2023-08-05 DIAGNOSIS — D899 Disorder involving the immune mechanism, unspecified: Secondary | ICD-10-CM | POA: Diagnosis not present

## 2023-08-05 DIAGNOSIS — D631 Anemia in chronic kidney disease: Secondary | ICD-10-CM | POA: Diagnosis not present

## 2023-08-05 DIAGNOSIS — E559 Vitamin D deficiency, unspecified: Secondary | ICD-10-CM | POA: Diagnosis not present

## 2023-08-05 DIAGNOSIS — Z9483 Pancreas transplant status: Secondary | ICD-10-CM | POA: Insufficient documentation

## 2023-08-05 DIAGNOSIS — Z940 Kidney transplant status: Secondary | ICD-10-CM | POA: Diagnosis not present

## 2023-08-05 DIAGNOSIS — N390 Urinary tract infection, site not specified: Secondary | ICD-10-CM | POA: Diagnosis not present

## 2023-08-05 DIAGNOSIS — Z79899 Other long term (current) drug therapy: Secondary | ICD-10-CM | POA: Insufficient documentation

## 2023-08-05 DIAGNOSIS — B259 Cytomegaloviral disease, unspecified: Secondary | ICD-10-CM | POA: Diagnosis not present

## 2023-08-05 DIAGNOSIS — B9689 Other specified bacterial agents as the cause of diseases classified elsewhere: Secondary | ICD-10-CM | POA: Diagnosis not present

## 2023-08-05 DIAGNOSIS — Z114 Encounter for screening for human immunodeficiency virus [HIV]: Secondary | ICD-10-CM | POA: Diagnosis not present

## 2023-08-05 DIAGNOSIS — Z789 Other specified health status: Secondary | ICD-10-CM | POA: Insufficient documentation

## 2023-08-05 LAB — BASIC METABOLIC PANEL
Anion gap: 9 (ref 5–15)
BUN: 27 mg/dL — ABNORMAL HIGH (ref 8–23)
CO2: 22 mmol/L (ref 22–32)
Calcium: 9.1 mg/dL (ref 8.9–10.3)
Chloride: 103 mmol/L (ref 98–111)
Creatinine, Ser: 0.87 mg/dL (ref 0.44–1.00)
GFR, Estimated: >60 mL/min (ref >60)
Glucose, Bld: 97 mg/dL (ref 70–99)
Potassium: 4.9 mmol/L (ref 3.5–5.1)
Sodium: 134 mmol/L — ABNORMAL LOW (ref 135–145)

## 2023-08-05 LAB — CBC WITH DIFFERENTIAL/PLATELET
Abs Immature Granulocytes: 0.01 10*3/uL (ref 0.00–0.07)
Basophils Absolute: 0 10*3/uL (ref 0.0–0.1)
Basophils Relative: 1 %
Eosinophils Absolute: 0.1 10*3/uL (ref 0.0–0.5)
Eosinophils Relative: 4 %
HCT: 33.3 % — ABNORMAL LOW (ref 36.0–46.0)
Hemoglobin: 10.1 g/dL — ABNORMAL LOW (ref 12.0–15.0)
Immature Granulocytes: 0 %
Lymphocytes Relative: 20 %
Lymphs Abs: 0.6 10*3/uL — ABNORMAL LOW (ref 0.7–4.0)
MCH: 24.7 pg — ABNORMAL LOW (ref 26.0–34.0)
MCHC: 30.3 g/dL (ref 30.0–36.0)
MCV: 81.4 fL (ref 80.0–100.0)
Monocytes Absolute: 0.3 10*3/uL (ref 0.1–1.0)
Monocytes Relative: 9 %
Neutro Abs: 2 10*3/uL (ref 1.7–7.7)
Neutrophils Relative %: 66 %
Platelets: 221 10*3/uL (ref 150–400)
RBC: 4.09 MIL/uL (ref 3.87–5.11)
RDW: 23.3 % — ABNORMAL HIGH (ref 11.5–15.5)
WBC: 3 10*3/uL — ABNORMAL LOW (ref 4.0–10.5)
nRBC: 0 % (ref 0.0–0.2)

## 2023-08-05 LAB — TACROLIMUS LEVEL: Tacrolimus (FK506) - LabCorp: 9.8 ng/mL (ref 2.0–20.0)

## 2023-08-05 LAB — MAGNESIUM: Magnesium: 1.9 mg/dL (ref 1.7–2.4)

## 2023-08-05 LAB — URINE CULTURE: Culture: 30000 — AB

## 2023-08-05 LAB — PHOSPHORUS: Phosphorus: 3.9 mg/dL (ref 2.5–4.6)

## 2023-08-05 LAB — ALKALINE PHOSPHATASE: Alkaline Phosphatase: 72 U/L (ref 38–126)

## 2023-08-05 LAB — GAMMA GT: GGT: 52 U/L — ABNORMAL HIGH (ref 7–50)

## 2023-08-05 LAB — ALBUMIN: Albumin: 3.8 g/dL (ref 3.5–5.0)

## 2023-08-05 LAB — ALT: ALT: 14 U/L (ref 0–44)

## 2023-08-05 LAB — AST: AST: 29 U/L (ref 15–41)

## 2023-08-05 LAB — BILIRUBIN, TOTAL: Total Bilirubin: 1.3 mg/dL — ABNORMAL HIGH (ref 0.3–1.2)

## 2023-08-08 LAB — TACROLIMUS LEVEL: Tacrolimus (FK506) - LabCorp: 9.5 ng/mL (ref 2.0–20.0)

## 2023-08-10 ENCOUNTER — Other Ambulatory Visit (HOSPITAL_COMMUNITY)
Admission: RE | Admit: 2023-08-10 | Discharge: 2023-08-10 | Disposition: A | Discharge status: Home / Self Care | Payer: 59 | Source: Ambulatory Visit | Attending: Nephrology | Admitting: Nephrology

## 2023-08-10 DIAGNOSIS — B952 Enterococcus as the cause of diseases classified elsewhere: Secondary | ICD-10-CM | POA: Diagnosis not present

## 2023-08-10 DIAGNOSIS — M329 Systemic lupus erythematosus, unspecified: Secondary | ICD-10-CM | POA: Diagnosis not present

## 2023-08-10 DIAGNOSIS — N2889 Other specified disorders of kidney and ureter: Secondary | ICD-10-CM | POA: Diagnosis not present

## 2023-08-10 DIAGNOSIS — R5381 Other malaise: Secondary | ICD-10-CM | POA: Diagnosis not present

## 2023-08-10 DIAGNOSIS — I10 Essential (primary) hypertension: Secondary | ICD-10-CM | POA: Diagnosis not present

## 2023-08-10 DIAGNOSIS — D84821 Immunodeficiency due to drugs: Secondary | ICD-10-CM | POA: Diagnosis not present

## 2023-08-10 DIAGNOSIS — Z79621 Long term (current) use of calcineurin inhibitor: Secondary | ICD-10-CM | POA: Diagnosis not present

## 2023-08-10 DIAGNOSIS — N1330 Unspecified hydronephrosis: Secondary | ICD-10-CM | POA: Diagnosis not present

## 2023-08-10 DIAGNOSIS — E861 Hypovolemia: Secondary | ICD-10-CM | POA: Diagnosis not present

## 2023-08-10 DIAGNOSIS — Z940 Kidney transplant status: Secondary | ICD-10-CM | POA: Insufficient documentation

## 2023-08-10 DIAGNOSIS — R509 Fever, unspecified: Secondary | ICD-10-CM | POA: Diagnosis not present

## 2023-08-10 DIAGNOSIS — N3000 Acute cystitis without hematuria: Secondary | ICD-10-CM | POA: Diagnosis not present

## 2023-08-10 DIAGNOSIS — D849 Immunodeficiency, unspecified: Secondary | ICD-10-CM | POA: Diagnosis not present

## 2023-08-10 DIAGNOSIS — A419 Sepsis, unspecified organism: Secondary | ICD-10-CM | POA: Diagnosis not present

## 2023-08-10 DIAGNOSIS — Z1152 Encounter for screening for COVID-19: Secondary | ICD-10-CM | POA: Diagnosis not present

## 2023-08-10 DIAGNOSIS — Z7982 Long term (current) use of aspirin: Secondary | ICD-10-CM | POA: Diagnosis not present

## 2023-08-10 DIAGNOSIS — Z79899 Other long term (current) drug therapy: Secondary | ICD-10-CM | POA: Diagnosis not present

## 2023-08-10 DIAGNOSIS — I959 Hypotension, unspecified: Secondary | ICD-10-CM | POA: Diagnosis not present

## 2023-08-10 DIAGNOSIS — R5081 Fever presenting with conditions classified elsewhere: Secondary | ICD-10-CM | POA: Diagnosis not present

## 2023-08-10 DIAGNOSIS — K828 Other specified diseases of gallbladder: Secondary | ICD-10-CM | POA: Diagnosis not present

## 2023-08-10 DIAGNOSIS — Z882 Allergy status to sulfonamides status: Secondary | ICD-10-CM | POA: Diagnosis not present

## 2023-08-10 DIAGNOSIS — Z87891 Personal history of nicotine dependence: Secondary | ICD-10-CM | POA: Diagnosis not present

## 2023-08-10 DIAGNOSIS — K219 Gastro-esophageal reflux disease without esophagitis: Secondary | ICD-10-CM | POA: Diagnosis not present

## 2023-08-10 DIAGNOSIS — R531 Weakness: Secondary | ICD-10-CM | POA: Diagnosis not present

## 2023-08-10 DIAGNOSIS — E871 Hypo-osmolality and hyponatremia: Secondary | ICD-10-CM | POA: Diagnosis not present

## 2023-08-10 DIAGNOSIS — N390 Urinary tract infection, site not specified: Secondary | ICD-10-CM | POA: Diagnosis not present

## 2023-08-10 DIAGNOSIS — B954 Other streptococcus as the cause of diseases classified elsewhere: Secondary | ICD-10-CM | POA: Diagnosis not present

## 2023-08-10 LAB — CBC WITH DIFFERENTIAL/PLATELET
Abs Immature Granulocytes: 0.03 10*3/uL (ref 0.00–0.07)
Basophils Absolute: 0 10*3/uL (ref 0.0–0.1)
Basophils Relative: 0 %
Eosinophils Absolute: 0 10*3/uL (ref 0.0–0.5)
Eosinophils Relative: 1 %
HCT: 36.7 % (ref 36.0–46.0)
Hemoglobin: 11.4 g/dL — ABNORMAL LOW (ref 12.0–15.0)
Immature Granulocytes: 1 %
Lymphocytes Relative: 7 %
Lymphs Abs: 0.4 10*3/uL — ABNORMAL LOW (ref 0.7–4.0)
MCH: 25.5 pg — ABNORMAL LOW (ref 26.0–34.0)
MCHC: 31.1 g/dL (ref 30.0–36.0)
MCV: 82.1 fL (ref 80.0–100.0)
Monocytes Absolute: 0.3 10*3/uL (ref 0.1–1.0)
Monocytes Relative: 5 %
Neutro Abs: 5.2 10*3/uL (ref 1.7–7.7)
Neutrophils Relative %: 86 %
Platelets: 191 10*3/uL (ref 150–400)
RBC: 4.47 MIL/uL (ref 3.87–5.11)
RDW: 23.1 % — ABNORMAL HIGH (ref 11.5–15.5)
WBC: 6 10*3/uL (ref 4.0–10.5)
nRBC: 0 % (ref 0.0–0.2)

## 2023-08-10 LAB — BASIC METABOLIC PANEL
Anion gap: 11 (ref 5–15)
BUN: 28 mg/dL — ABNORMAL HIGH (ref 8–23)
CO2: 22 mmol/L (ref 22–32)
Calcium: 9.5 mg/dL (ref 8.9–10.3)
Chloride: 99 mmol/L (ref 98–111)
Creatinine, Ser: 1.02 mg/dL — ABNORMAL HIGH (ref 0.44–1.00)
GFR, Estimated: 60 mL/min — ABNORMAL LOW (ref >60)
Glucose, Bld: 101 mg/dL — ABNORMAL HIGH (ref 70–99)
Potassium: 4.2 mmol/L (ref 3.5–5.1)
Sodium: 132 mmol/L — ABNORMAL LOW (ref 135–145)

## 2023-08-10 LAB — HEPATITIS C GENOTYPE

## 2023-08-10 LAB — HCV RNA QUANT

## 2023-08-10 LAB — MAGNESIUM: Magnesium: 1.8 mg/dL (ref 1.7–2.4)

## 2023-08-10 LAB — PHOSPHORUS: Phosphorus: 3.2 mg/dL (ref 2.5–4.6)

## 2023-08-11 DIAGNOSIS — R5381 Other malaise: Secondary | ICD-10-CM | POA: Diagnosis not present

## 2023-08-11 DIAGNOSIS — N1330 Unspecified hydronephrosis: Secondary | ICD-10-CM | POA: Diagnosis not present

## 2023-08-11 DIAGNOSIS — N390 Urinary tract infection, site not specified: Secondary | ICD-10-CM | POA: Diagnosis not present

## 2023-08-11 DIAGNOSIS — D84821 Immunodeficiency due to drugs: Secondary | ICD-10-CM | POA: Diagnosis not present

## 2023-08-11 DIAGNOSIS — Z79899 Other long term (current) drug therapy: Secondary | ICD-10-CM | POA: Diagnosis not present

## 2023-08-11 DIAGNOSIS — Z940 Kidney transplant status: Secondary | ICD-10-CM | POA: Diagnosis not present

## 2023-08-11 DIAGNOSIS — R509 Fever, unspecified: Secondary | ICD-10-CM | POA: Diagnosis not present

## 2023-08-11 DIAGNOSIS — A419 Sepsis, unspecified organism: Secondary | ICD-10-CM | POA: Diagnosis not present

## 2023-08-11 DIAGNOSIS — Z79621 Long term (current) use of calcineurin inhibitor: Secondary | ICD-10-CM | POA: Diagnosis not present

## 2023-08-12 DIAGNOSIS — R509 Fever, unspecified: Secondary | ICD-10-CM | POA: Diagnosis not present

## 2023-08-12 DIAGNOSIS — R5081 Fever presenting with conditions classified elsewhere: Secondary | ICD-10-CM | POA: Diagnosis not present

## 2023-08-12 DIAGNOSIS — B952 Enterococcus as the cause of diseases classified elsewhere: Secondary | ICD-10-CM | POA: Diagnosis not present

## 2023-08-12 DIAGNOSIS — Z940 Kidney transplant status: Secondary | ICD-10-CM | POA: Diagnosis not present

## 2023-08-12 DIAGNOSIS — D84821 Immunodeficiency due to drugs: Secondary | ICD-10-CM | POA: Diagnosis not present

## 2023-08-12 DIAGNOSIS — K828 Other specified diseases of gallbladder: Secondary | ICD-10-CM | POA: Diagnosis not present

## 2023-08-12 DIAGNOSIS — Z79621 Long term (current) use of calcineurin inhibitor: Secondary | ICD-10-CM | POA: Diagnosis not present

## 2023-08-12 DIAGNOSIS — R531 Weakness: Secondary | ICD-10-CM | POA: Diagnosis not present

## 2023-08-12 DIAGNOSIS — N2889 Other specified disorders of kidney and ureter: Secondary | ICD-10-CM | POA: Diagnosis not present

## 2023-08-12 DIAGNOSIS — N390 Urinary tract infection, site not specified: Secondary | ICD-10-CM | POA: Diagnosis not present

## 2023-08-12 LAB — TACROLIMUS LEVEL: Tacrolimus (FK506) - LabCorp: 9 ng/mL (ref 2.0–20.0)

## 2023-08-13 DIAGNOSIS — N390 Urinary tract infection, site not specified: Secondary | ICD-10-CM | POA: Diagnosis not present

## 2023-08-13 DIAGNOSIS — B954 Other streptococcus as the cause of diseases classified elsewhere: Secondary | ICD-10-CM | POA: Diagnosis not present

## 2023-08-13 DIAGNOSIS — R509 Fever, unspecified: Secondary | ICD-10-CM | POA: Diagnosis not present

## 2023-08-13 DIAGNOSIS — Z940 Kidney transplant status: Secondary | ICD-10-CM | POA: Diagnosis not present

## 2023-08-13 DIAGNOSIS — B952 Enterococcus as the cause of diseases classified elsewhere: Secondary | ICD-10-CM | POA: Diagnosis not present

## 2023-08-14 DIAGNOSIS — R509 Fever, unspecified: Secondary | ICD-10-CM | POA: Diagnosis not present

## 2023-08-15 DIAGNOSIS — D849 Immunodeficiency, unspecified: Secondary | ICD-10-CM | POA: Diagnosis not present

## 2023-08-15 DIAGNOSIS — Z940 Kidney transplant status: Secondary | ICD-10-CM | POA: Diagnosis not present

## 2023-08-15 DIAGNOSIS — R509 Fever, unspecified: Secondary | ICD-10-CM | POA: Diagnosis not present

## 2023-08-15 DIAGNOSIS — R5381 Other malaise: Secondary | ICD-10-CM | POA: Diagnosis not present

## 2023-08-16 DIAGNOSIS — Z4822 Encounter for aftercare following kidney transplant: Secondary | ICD-10-CM | POA: Diagnosis not present

## 2023-08-16 DIAGNOSIS — Z7982 Long term (current) use of aspirin: Secondary | ICD-10-CM | POA: Diagnosis not present

## 2023-08-16 DIAGNOSIS — N136 Pyonephrosis: Secondary | ICD-10-CM | POA: Diagnosis not present

## 2023-08-16 DIAGNOSIS — I129 Hypertensive chronic kidney disease with stage 1 through stage 4 chronic kidney disease, or unspecified chronic kidney disease: Secondary | ICD-10-CM | POA: Diagnosis not present

## 2023-08-16 DIAGNOSIS — Z87891 Personal history of nicotine dependence: Secondary | ICD-10-CM | POA: Diagnosis not present

## 2023-08-16 DIAGNOSIS — K219 Gastro-esophageal reflux disease without esophagitis: Secondary | ICD-10-CM | POA: Diagnosis not present

## 2023-08-16 DIAGNOSIS — N189 Chronic kidney disease, unspecified: Secondary | ICD-10-CM | POA: Diagnosis not present

## 2023-08-16 DIAGNOSIS — Z940 Kidney transplant status: Secondary | ICD-10-CM | POA: Diagnosis not present

## 2023-08-16 DIAGNOSIS — B952 Enterococcus as the cause of diseases classified elsewhere: Secondary | ICD-10-CM | POA: Diagnosis not present

## 2023-08-16 DIAGNOSIS — M3214 Glomerular disease in systemic lupus erythematosus: Secondary | ICD-10-CM | POA: Diagnosis not present

## 2023-08-17 ENCOUNTER — Other Ambulatory Visit (HOSPITAL_COMMUNITY)
Admission: RE | Admit: 2023-08-17 | Discharge: 2023-08-17 | Disposition: A | Discharge status: Home / Self Care | Payer: 59 | Source: Ambulatory Visit | Attending: Nephrology | Admitting: Nephrology

## 2023-08-17 DIAGNOSIS — Z940 Kidney transplant status: Secondary | ICD-10-CM | POA: Insufficient documentation

## 2023-08-18 DIAGNOSIS — N136 Pyonephrosis: Secondary | ICD-10-CM | POA: Diagnosis not present

## 2023-08-18 DIAGNOSIS — Z7982 Long term (current) use of aspirin: Secondary | ICD-10-CM | POA: Diagnosis not present

## 2023-08-18 DIAGNOSIS — M3214 Glomerular disease in systemic lupus erythematosus: Secondary | ICD-10-CM | POA: Diagnosis not present

## 2023-08-18 DIAGNOSIS — Z4822 Encounter for aftercare following kidney transplant: Secondary | ICD-10-CM | POA: Diagnosis not present

## 2023-08-18 DIAGNOSIS — I129 Hypertensive chronic kidney disease with stage 1 through stage 4 chronic kidney disease, or unspecified chronic kidney disease: Secondary | ICD-10-CM | POA: Diagnosis not present

## 2023-08-18 DIAGNOSIS — Z940 Kidney transplant status: Secondary | ICD-10-CM | POA: Diagnosis not present

## 2023-08-18 DIAGNOSIS — B952 Enterococcus as the cause of diseases classified elsewhere: Secondary | ICD-10-CM | POA: Diagnosis not present

## 2023-08-18 DIAGNOSIS — N189 Chronic kidney disease, unspecified: Secondary | ICD-10-CM | POA: Diagnosis not present

## 2023-08-18 DIAGNOSIS — K219 Gastro-esophageal reflux disease without esophagitis: Secondary | ICD-10-CM | POA: Diagnosis not present

## 2023-08-18 DIAGNOSIS — Z87891 Personal history of nicotine dependence: Secondary | ICD-10-CM | POA: Diagnosis not present

## 2023-08-18 LAB — HCV RNA DIAGNOSIS, NAA: HCV RNA, Quantitation: NOT DETECTED [IU]/mL

## 2023-08-19 DIAGNOSIS — Z7982 Long term (current) use of aspirin: Secondary | ICD-10-CM | POA: Diagnosis not present

## 2023-08-19 DIAGNOSIS — Z940 Kidney transplant status: Secondary | ICD-10-CM | POA: Diagnosis not present

## 2023-08-19 DIAGNOSIS — Z4822 Encounter for aftercare following kidney transplant: Secondary | ICD-10-CM | POA: Diagnosis not present

## 2023-08-19 DIAGNOSIS — Z87891 Personal history of nicotine dependence: Secondary | ICD-10-CM | POA: Diagnosis not present

## 2023-08-19 DIAGNOSIS — M3214 Glomerular disease in systemic lupus erythematosus: Secondary | ICD-10-CM | POA: Diagnosis not present

## 2023-08-19 DIAGNOSIS — I129 Hypertensive chronic kidney disease with stage 1 through stage 4 chronic kidney disease, or unspecified chronic kidney disease: Secondary | ICD-10-CM | POA: Diagnosis not present

## 2023-08-19 DIAGNOSIS — B952 Enterococcus as the cause of diseases classified elsewhere: Secondary | ICD-10-CM | POA: Diagnosis not present

## 2023-08-19 DIAGNOSIS — K219 Gastro-esophageal reflux disease without esophagitis: Secondary | ICD-10-CM | POA: Diagnosis not present

## 2023-08-19 DIAGNOSIS — N136 Pyonephrosis: Secondary | ICD-10-CM | POA: Diagnosis not present

## 2023-08-19 DIAGNOSIS — N189 Chronic kidney disease, unspecified: Secondary | ICD-10-CM | POA: Diagnosis not present

## 2023-08-20 ENCOUNTER — Other Ambulatory Visit (HOSPITAL_COMMUNITY)
Admission: RE | Admit: 2023-08-20 | Discharge: 2023-08-20 | Disposition: A | Discharge status: Home / Self Care | Payer: 59 | Source: Ambulatory Visit | Attending: Nephrology | Admitting: Nephrology

## 2023-08-20 DIAGNOSIS — Z940 Kidney transplant status: Secondary | ICD-10-CM | POA: Diagnosis not present

## 2023-08-21 LAB — HCV RNA DIAGNOSIS, NAA: HCV RNA, Quantitation: NOT DETECTED [IU]/mL

## 2023-08-24 DIAGNOSIS — Z7982 Long term (current) use of aspirin: Secondary | ICD-10-CM | POA: Diagnosis not present

## 2023-08-24 DIAGNOSIS — N189 Chronic kidney disease, unspecified: Secondary | ICD-10-CM | POA: Diagnosis not present

## 2023-08-24 DIAGNOSIS — Z940 Kidney transplant status: Secondary | ICD-10-CM | POA: Diagnosis not present

## 2023-08-24 DIAGNOSIS — B952 Enterococcus as the cause of diseases classified elsewhere: Secondary | ICD-10-CM | POA: Diagnosis not present

## 2023-08-24 DIAGNOSIS — N136 Pyonephrosis: Secondary | ICD-10-CM | POA: Diagnosis not present

## 2023-08-24 DIAGNOSIS — Z87891 Personal history of nicotine dependence: Secondary | ICD-10-CM | POA: Diagnosis not present

## 2023-08-24 DIAGNOSIS — I129 Hypertensive chronic kidney disease with stage 1 through stage 4 chronic kidney disease, or unspecified chronic kidney disease: Secondary | ICD-10-CM | POA: Diagnosis not present

## 2023-08-24 DIAGNOSIS — K219 Gastro-esophageal reflux disease without esophagitis: Secondary | ICD-10-CM | POA: Diagnosis not present

## 2023-08-24 DIAGNOSIS — Z4822 Encounter for aftercare following kidney transplant: Secondary | ICD-10-CM | POA: Diagnosis not present

## 2023-08-24 DIAGNOSIS — M3214 Glomerular disease in systemic lupus erythematosus: Secondary | ICD-10-CM | POA: Diagnosis not present

## 2023-08-26 DIAGNOSIS — K219 Gastro-esophageal reflux disease without esophagitis: Secondary | ICD-10-CM | POA: Diagnosis not present

## 2023-08-26 DIAGNOSIS — M3214 Glomerular disease in systemic lupus erythematosus: Secondary | ICD-10-CM | POA: Diagnosis not present

## 2023-08-26 DIAGNOSIS — I10 Essential (primary) hypertension: Secondary | ICD-10-CM | POA: Diagnosis not present

## 2023-08-26 DIAGNOSIS — R509 Fever, unspecified: Secondary | ICD-10-CM | POA: Diagnosis not present

## 2023-08-26 DIAGNOSIS — Z23 Encounter for immunization: Secondary | ICD-10-CM | POA: Diagnosis not present

## 2023-08-26 DIAGNOSIS — N2581 Secondary hyperparathyroidism of renal origin: Secondary | ICD-10-CM | POA: Diagnosis not present

## 2023-08-26 DIAGNOSIS — D631 Anemia in chronic kidney disease: Secondary | ICD-10-CM | POA: Diagnosis not present

## 2023-08-26 DIAGNOSIS — D849 Immunodeficiency, unspecified: Secondary | ICD-10-CM | POA: Diagnosis not present

## 2023-08-26 DIAGNOSIS — R809 Proteinuria, unspecified: Secondary | ICD-10-CM | POA: Diagnosis not present

## 2023-08-26 DIAGNOSIS — R918 Other nonspecific abnormal finding of lung field: Secondary | ICD-10-CM | POA: Diagnosis not present

## 2023-08-26 DIAGNOSIS — Z940 Kidney transplant status: Secondary | ICD-10-CM | POA: Diagnosis not present

## 2023-08-27 DIAGNOSIS — Z4822 Encounter for aftercare following kidney transplant: Secondary | ICD-10-CM | POA: Diagnosis not present

## 2023-08-27 DIAGNOSIS — K219 Gastro-esophageal reflux disease without esophagitis: Secondary | ICD-10-CM | POA: Diagnosis not present

## 2023-08-27 DIAGNOSIS — N136 Pyonephrosis: Secondary | ICD-10-CM | POA: Diagnosis not present

## 2023-08-27 DIAGNOSIS — Z940 Kidney transplant status: Secondary | ICD-10-CM | POA: Diagnosis not present

## 2023-08-27 DIAGNOSIS — M3214 Glomerular disease in systemic lupus erythematosus: Secondary | ICD-10-CM | POA: Diagnosis not present

## 2023-08-27 DIAGNOSIS — Z87891 Personal history of nicotine dependence: Secondary | ICD-10-CM | POA: Diagnosis not present

## 2023-08-27 DIAGNOSIS — N189 Chronic kidney disease, unspecified: Secondary | ICD-10-CM | POA: Diagnosis not present

## 2023-08-27 DIAGNOSIS — B952 Enterococcus as the cause of diseases classified elsewhere: Secondary | ICD-10-CM | POA: Diagnosis not present

## 2023-08-27 DIAGNOSIS — Z7982 Long term (current) use of aspirin: Secondary | ICD-10-CM | POA: Diagnosis not present

## 2023-08-27 DIAGNOSIS — I129 Hypertensive chronic kidney disease with stage 1 through stage 4 chronic kidney disease, or unspecified chronic kidney disease: Secondary | ICD-10-CM | POA: Diagnosis not present

## 2023-09-01 ENCOUNTER — Other Ambulatory Visit (HOSPITAL_COMMUNITY)
Admission: RE | Admit: 2023-09-01 | Discharge: 2023-09-01 | Disposition: A | Discharge status: Home / Self Care | Payer: 59 | Source: Ambulatory Visit | Attending: Nephrology | Admitting: Nephrology

## 2023-09-01 DIAGNOSIS — Z79899 Other long term (current) drug therapy: Secondary | ICD-10-CM | POA: Insufficient documentation

## 2023-09-01 DIAGNOSIS — Z114 Encounter for screening for human immunodeficiency virus [HIV]: Secondary | ICD-10-CM | POA: Diagnosis not present

## 2023-09-01 DIAGNOSIS — Z9483 Pancreas transplant status: Secondary | ICD-10-CM | POA: Diagnosis not present

## 2023-09-01 DIAGNOSIS — D631 Anemia in chronic kidney disease: Secondary | ICD-10-CM | POA: Insufficient documentation

## 2023-09-01 DIAGNOSIS — D899 Disorder involving the immune mechanism, unspecified: Secondary | ICD-10-CM | POA: Diagnosis not present

## 2023-09-01 DIAGNOSIS — Z789 Other specified health status: Secondary | ICD-10-CM | POA: Diagnosis not present

## 2023-09-01 DIAGNOSIS — Z940 Kidney transplant status: Secondary | ICD-10-CM | POA: Diagnosis not present

## 2023-09-01 DIAGNOSIS — E1129 Type 2 diabetes mellitus with other diabetic kidney complication: Secondary | ICD-10-CM | POA: Insufficient documentation

## 2023-09-01 DIAGNOSIS — Z48288 Encounter for aftercare following multiple organ transplant: Secondary | ICD-10-CM | POA: Diagnosis not present

## 2023-09-01 LAB — CBC WITH DIFFERENTIAL/PLATELET
Abs Immature Granulocytes: 0.02 10*3/uL (ref 0.00–0.07)
Basophils Absolute: 0 10*3/uL (ref 0.0–0.1)
Basophils Relative: 0 %
Eosinophils Absolute: 0.1 10*3/uL (ref 0.0–0.5)
Eosinophils Relative: 1 %
HCT: 33.1 % — ABNORMAL LOW (ref 36.0–46.0)
Hemoglobin: 10.3 g/dL — ABNORMAL LOW (ref 12.0–15.0)
Immature Granulocytes: 0 %
Lymphocytes Relative: 9 %
Lymphs Abs: 0.6 10*3/uL — ABNORMAL LOW (ref 0.7–4.0)
MCH: 27.2 pg (ref 26.0–34.0)
MCHC: 31.1 g/dL (ref 30.0–36.0)
MCV: 87.3 fL (ref 80.0–100.0)
Monocytes Absolute: 0.3 10*3/uL (ref 0.1–1.0)
Monocytes Relative: 4 %
Neutro Abs: 5.8 10*3/uL (ref 1.7–7.7)
Neutrophils Relative %: 86 %
Platelets: 228 10*3/uL (ref 150–400)
RBC: 3.79 MIL/uL — ABNORMAL LOW (ref 3.87–5.11)
RDW: 22.5 % — ABNORMAL HIGH (ref 11.5–15.5)
WBC: 6.8 10*3/uL (ref 4.0–10.5)
nRBC: 0 % (ref 0.0–0.2)

## 2023-09-01 LAB — URINALYSIS, ROUTINE W REFLEX MICROSCOPIC
Bilirubin Urine: NEGATIVE
Glucose, UA: NEGATIVE mg/dL
Hgb urine dipstick: NEGATIVE
Ketones, ur: NEGATIVE mg/dL
Leukocytes,Ua: NEGATIVE
Nitrite: NEGATIVE
Protein, ur: NEGATIVE mg/dL
Specific Gravity, Urine: 1.005 (ref 1.005–1.030)
pH: 6 (ref 5.0–8.0)

## 2023-09-01 LAB — BASIC METABOLIC PANEL
Anion gap: 9 (ref 5–15)
BUN: 20 mg/dL (ref 8–23)
CO2: 20 mmol/L — ABNORMAL LOW (ref 22–32)
Calcium: 8.9 mg/dL (ref 8.9–10.3)
Chloride: 103 mmol/L (ref 98–111)
Creatinine, Ser: 0.98 mg/dL (ref 0.44–1.00)
GFR, Estimated: >60 mL/min (ref >60)
Glucose, Bld: 90 mg/dL (ref 70–99)
Potassium: 4.3 mmol/L (ref 3.5–5.1)
Sodium: 132 mmol/L — ABNORMAL LOW (ref 135–145)

## 2023-09-01 LAB — PHOSPHORUS: Phosphorus: 3.1 mg/dL (ref 2.5–4.6)

## 2023-09-01 LAB — MAGNESIUM: Magnesium: 1.6 mg/dL — ABNORMAL LOW (ref 1.7–2.4)

## 2023-09-01 LAB — PROTEIN / CREATININE RATIO, URINE
Creatinine, Urine: 32 mg/dL
Total Protein, Urine: <6 mg/dL

## 2023-09-02 DIAGNOSIS — Z940 Kidney transplant status: Secondary | ICD-10-CM | POA: Diagnosis not present

## 2023-09-02 DIAGNOSIS — Z4822 Encounter for aftercare following kidney transplant: Secondary | ICD-10-CM | POA: Diagnosis not present

## 2023-09-02 DIAGNOSIS — Z7982 Long term (current) use of aspirin: Secondary | ICD-10-CM | POA: Diagnosis not present

## 2023-09-02 DIAGNOSIS — I129 Hypertensive chronic kidney disease with stage 1 through stage 4 chronic kidney disease, or unspecified chronic kidney disease: Secondary | ICD-10-CM | POA: Diagnosis not present

## 2023-09-02 DIAGNOSIS — M3214 Glomerular disease in systemic lupus erythematosus: Secondary | ICD-10-CM | POA: Diagnosis not present

## 2023-09-02 DIAGNOSIS — K219 Gastro-esophageal reflux disease without esophagitis: Secondary | ICD-10-CM | POA: Diagnosis not present

## 2023-09-02 DIAGNOSIS — N136 Pyonephrosis: Secondary | ICD-10-CM | POA: Diagnosis not present

## 2023-09-02 DIAGNOSIS — B952 Enterococcus as the cause of diseases classified elsewhere: Secondary | ICD-10-CM | POA: Diagnosis not present

## 2023-09-02 DIAGNOSIS — N189 Chronic kidney disease, unspecified: Secondary | ICD-10-CM | POA: Diagnosis not present

## 2023-09-02 DIAGNOSIS — Z87891 Personal history of nicotine dependence: Secondary | ICD-10-CM | POA: Diagnosis not present

## 2023-09-02 LAB — URINE CULTURE: Culture: <10000 — AB

## 2023-09-02 LAB — MICROALBUMIN / CREATININE URINE RATIO
Creatinine, Urine: 29.3 mg/dL
Microalb Creat Ratio: <10 mg/g{creat} (ref 0–29)
Microalb, Ur: <3 ug/mL — ABNORMAL HIGH

## 2023-09-02 LAB — HCV RNA DIAGNOSIS, NAA: HCV RNA, Quantitation: NOT DETECTED [IU]/mL

## 2023-09-02 LAB — TACROLIMUS LEVEL: Tacrolimus (FK506) - LabCorp: 7.8 ng/mL (ref 2.0–20.0)

## 2023-09-08 ENCOUNTER — Other Ambulatory Visit (HOSPITAL_COMMUNITY)
Admission: RE | Admit: 2023-09-08 | Discharge: 2023-09-08 | Disposition: A | Discharge status: Home / Self Care | Payer: 59 | Source: Ambulatory Visit | Attending: Nephrology | Admitting: Nephrology

## 2023-09-08 DIAGNOSIS — N186 End stage renal disease: Secondary | ICD-10-CM | POA: Insufficient documentation

## 2023-09-08 DIAGNOSIS — I120 Hypertensive chronic kidney disease with stage 5 chronic kidney disease or end stage renal disease: Secondary | ICD-10-CM | POA: Insufficient documentation

## 2023-09-08 DIAGNOSIS — Z992 Dependence on renal dialysis: Secondary | ICD-10-CM | POA: Insufficient documentation

## 2023-09-08 DIAGNOSIS — Z940 Kidney transplant status: Secondary | ICD-10-CM | POA: Insufficient documentation

## 2023-09-08 DIAGNOSIS — M3210 Systemic lupus erythematosus, organ or system involvement unspecified: Secondary | ICD-10-CM | POA: Diagnosis not present

## 2023-09-08 LAB — PROTEIN / CREATININE RATIO, URINE
Creatinine, Urine: 33 mg/dL
Total Protein, Urine: <6 mg/dL

## 2023-09-08 LAB — CBC WITH DIFFERENTIAL/PLATELET
Abs Immature Granulocytes: 0.15 10*3/uL — ABNORMAL HIGH (ref 0.00–0.07)
Basophils Absolute: 0.1 10*3/uL (ref 0.0–0.1)
Basophils Relative: 1 %
Eosinophils Absolute: 0.1 10*3/uL (ref 0.0–0.5)
Eosinophils Relative: 2 %
HCT: 32.9 % — ABNORMAL LOW (ref 36.0–46.0)
Hemoglobin: 10.3 g/dL — ABNORMAL LOW (ref 12.0–15.0)
Immature Granulocytes: 3 %
Lymphocytes Relative: 14 %
Lymphs Abs: 0.8 10*3/uL (ref 0.7–4.0)
MCH: 27.9 pg (ref 26.0–34.0)
MCHC: 31.3 g/dL (ref 30.0–36.0)
MCV: 89.2 fL (ref 80.0–100.0)
Monocytes Absolute: 0.5 10*3/uL (ref 0.1–1.0)
Monocytes Relative: 9 %
Neutro Abs: 3.9 10*3/uL (ref 1.7–7.7)
Neutrophils Relative %: 71 %
Platelets: 274 10*3/uL (ref 150–400)
RBC: 3.69 MIL/uL — ABNORMAL LOW (ref 3.87–5.11)
RDW: 21.2 % — ABNORMAL HIGH (ref 11.5–15.5)
WBC: 5.5 10*3/uL (ref 4.0–10.5)
nRBC: 0 % (ref 0.0–0.2)

## 2023-09-08 LAB — BASIC METABOLIC PANEL
Anion gap: 11 (ref 5–15)
BUN: 20 mg/dL (ref 8–23)
CO2: 23 mmol/L (ref 22–32)
Calcium: 9.3 mg/dL (ref 8.9–10.3)
Chloride: 102 mmol/L (ref 98–111)
Creatinine, Ser: 0.91 mg/dL (ref 0.44–1.00)
GFR, Estimated: >60 mL/min (ref >60)
Glucose, Bld: 77 mg/dL (ref 70–99)
Potassium: 3.9 mmol/L (ref 3.5–5.1)
Sodium: 136 mmol/L (ref 135–145)

## 2023-09-08 LAB — URINALYSIS, COMPLETE (UACMP) WITH MICROSCOPIC
Bacteria, UA: NONE SEEN
Bilirubin Urine: NEGATIVE
Glucose, UA: NEGATIVE mg/dL
Hgb urine dipstick: NEGATIVE
Ketones, ur: NEGATIVE mg/dL
Leukocytes,Ua: NEGATIVE
Nitrite: NEGATIVE
Protein, ur: NEGATIVE mg/dL
Specific Gravity, Urine: 1.005 (ref 1.005–1.030)
pH: 5 (ref 5.0–8.0)

## 2023-09-08 LAB — MAGNESIUM: Magnesium: 1.7 mg/dL (ref 1.7–2.4)

## 2023-09-08 LAB — PHOSPHORUS: Phosphorus: 3.2 mg/dL (ref 2.5–4.6)

## 2023-09-09 LAB — HCV RNA DIAGNOSIS, NAA: HCV RNA, Quantitation: NOT DETECTED [IU]/mL

## 2023-09-09 LAB — MICROALBUMIN / CREATININE URINE RATIO
Creatinine, Urine: 29.2 mg/dL
Microalb Creat Ratio: <10 mg/g{creat} (ref 0–29)
Microalb, Ur: <3 ug/mL — ABNORMAL HIGH

## 2023-09-10 DIAGNOSIS — M329 Systemic lupus erythematosus, unspecified: Secondary | ICD-10-CM | POA: Diagnosis not present

## 2023-09-10 DIAGNOSIS — Z940 Kidney transplant status: Secondary | ICD-10-CM | POA: Diagnosis not present

## 2023-09-10 DIAGNOSIS — Z79899 Other long term (current) drug therapy: Secondary | ICD-10-CM | POA: Diagnosis not present

## 2023-09-10 DIAGNOSIS — Z23 Encounter for immunization: Secondary | ICD-10-CM | POA: Diagnosis not present

## 2023-09-10 DIAGNOSIS — Z7982 Long term (current) use of aspirin: Secondary | ICD-10-CM | POA: Diagnosis not present

## 2023-09-10 DIAGNOSIS — I120 Hypertensive chronic kidney disease with stage 5 chronic kidney disease or end stage renal disease: Secondary | ICD-10-CM | POA: Diagnosis not present

## 2023-09-10 DIAGNOSIS — Z87898 Personal history of other specified conditions: Secondary | ICD-10-CM | POA: Diagnosis not present

## 2023-09-10 DIAGNOSIS — N186 End stage renal disease: Secondary | ICD-10-CM | POA: Diagnosis not present

## 2023-09-10 LAB — TACROLIMUS LEVEL: Tacrolimus (FK506) - LabCorp: 7 ng/mL (ref 2.0–20.0)

## 2023-09-10 LAB — URINE CULTURE: Culture: 60000 — AB

## 2023-09-15 ENCOUNTER — Other Ambulatory Visit (HOSPITAL_COMMUNITY)
Admission: RE | Admit: 2023-09-15 | Discharge: 2023-09-15 | Disposition: A | Discharge status: Home / Self Care | Payer: 59 | Source: Ambulatory Visit | Attending: Nephrology | Admitting: Nephrology

## 2023-09-15 DIAGNOSIS — Z79899 Other long term (current) drug therapy: Secondary | ICD-10-CM | POA: Diagnosis not present

## 2023-09-15 DIAGNOSIS — Z789 Other specified health status: Secondary | ICD-10-CM | POA: Diagnosis not present

## 2023-09-15 DIAGNOSIS — T8610 Unspecified complication of kidney transplant: Secondary | ICD-10-CM | POA: Insufficient documentation

## 2023-09-15 DIAGNOSIS — Z940 Kidney transplant status: Secondary | ICD-10-CM | POA: Insufficient documentation

## 2023-09-15 DIAGNOSIS — D631 Anemia in chronic kidney disease: Secondary | ICD-10-CM | POA: Diagnosis not present

## 2023-09-15 DIAGNOSIS — B259 Cytomegaloviral disease, unspecified: Secondary | ICD-10-CM | POA: Diagnosis not present

## 2023-09-15 DIAGNOSIS — E559 Vitamin D deficiency, unspecified: Secondary | ICD-10-CM | POA: Insufficient documentation

## 2023-09-15 DIAGNOSIS — Z9483 Pancreas transplant status: Secondary | ICD-10-CM | POA: Insufficient documentation

## 2023-09-15 DIAGNOSIS — N390 Urinary tract infection, site not specified: Secondary | ICD-10-CM | POA: Diagnosis not present

## 2023-09-15 DIAGNOSIS — E1129 Type 2 diabetes mellitus with other diabetic kidney complication: Secondary | ICD-10-CM | POA: Diagnosis not present

## 2023-09-15 DIAGNOSIS — D899 Disorder involving the immune mechanism, unspecified: Secondary | ICD-10-CM | POA: Diagnosis not present

## 2023-09-15 DIAGNOSIS — Z114 Encounter for screening for human immunodeficiency virus [HIV]: Secondary | ICD-10-CM | POA: Insufficient documentation

## 2023-09-15 LAB — BASIC METABOLIC PANEL
Anion gap: 11 (ref 5–15)
BUN: 27 mg/dL — ABNORMAL HIGH (ref 8–23)
CO2: 20 mmol/L — ABNORMAL LOW (ref 22–32)
Calcium: 9.4 mg/dL (ref 8.9–10.3)
Chloride: 102 mmol/L (ref 98–111)
Creatinine, Ser: 0.8 mg/dL (ref 0.44–1.00)
GFR, Estimated: >60 mL/min (ref >60)
Glucose, Bld: 94 mg/dL (ref 70–99)
Potassium: 4.3 mmol/L (ref 3.5–5.1)
Sodium: 133 mmol/L — ABNORMAL LOW (ref 135–145)

## 2023-09-15 LAB — CBC WITH DIFFERENTIAL/PLATELET
Abs Immature Granulocytes: 0.09 10*3/uL — ABNORMAL HIGH (ref 0.00–0.07)
Basophils Absolute: 0 10*3/uL (ref 0.0–0.1)
Basophils Relative: 1 %
Eosinophils Absolute: 0.1 10*3/uL (ref 0.0–0.5)
Eosinophils Relative: 1 %
HCT: 34.2 % — ABNORMAL LOW (ref 36.0–46.0)
Hemoglobin: 10.7 g/dL — ABNORMAL LOW (ref 12.0–15.0)
Immature Granulocytes: 2 %
Lymphocytes Relative: 10 %
Lymphs Abs: 0.5 10*3/uL — ABNORMAL LOW (ref 0.7–4.0)
MCH: 27.8 pg (ref 26.0–34.0)
MCHC: 31.3 g/dL (ref 30.0–36.0)
MCV: 88.8 fL (ref 80.0–100.0)
Monocytes Absolute: 0.3 10*3/uL (ref 0.1–1.0)
Monocytes Relative: 6 %
Neutro Abs: 4 10*3/uL (ref 1.7–7.7)
Neutrophils Relative %: 80 %
Platelets: 318 10*3/uL (ref 150–400)
RBC: 3.85 MIL/uL — ABNORMAL LOW (ref 3.87–5.11)
RDW: 20.5 % — ABNORMAL HIGH (ref 11.5–15.5)
WBC: 4.9 10*3/uL (ref 4.0–10.5)
nRBC: 0 % (ref 0.0–0.2)

## 2023-09-15 LAB — URINALYSIS, COMPLETE (UACMP) WITH MICROSCOPIC
Bacteria, UA: NONE SEEN
Bilirubin Urine: NEGATIVE
Glucose, UA: NEGATIVE mg/dL
Hgb urine dipstick: NEGATIVE
Ketones, ur: NEGATIVE mg/dL
Leukocytes,Ua: NEGATIVE
Nitrite: NEGATIVE
Protein, ur: NEGATIVE mg/dL
Specific Gravity, Urine: 1.006 (ref 1.005–1.030)
pH: 5 (ref 5.0–8.0)

## 2023-09-15 LAB — PHOSPHORUS: Phosphorus: 3.9 mg/dL (ref 2.5–4.6)

## 2023-09-15 LAB — PROTEIN / CREATININE RATIO, URINE
Creatinine, Urine: 35 mg/dL
Total Protein, Urine: <6 mg/dL

## 2023-09-15 LAB — MAGNESIUM: Magnesium: 1.9 mg/dL (ref 1.7–2.4)

## 2023-09-16 LAB — MICROALBUMIN / CREATININE URINE RATIO
Creatinine, Urine: 31.6 mg/dL
Microalb Creat Ratio: 12 mg/g{creat} (ref 0–29)
Microalb, Ur: 3.7 ug/mL — ABNORMAL HIGH

## 2023-09-16 LAB — HIV-1 RNA QUANT-NO REFLEX-BLD
HIV 1 RNA Quant: <20 {copies}/mL
LOG10 HIV-1 RNA: UNDETERMINED {Log}

## 2023-09-17 LAB — URINE CULTURE: Culture: >=100000 — AB

## 2023-09-17 LAB — TACROLIMUS LEVEL: Tacrolimus (FK506) - LabCorp: 8.2 ng/mL (ref 2.0–20.0)

## 2023-09-25 DIAGNOSIS — Z79621 Long term (current) use of calcineurin inhibitor: Secondary | ICD-10-CM | POA: Diagnosis not present

## 2023-09-25 DIAGNOSIS — I10 Essential (primary) hypertension: Secondary | ICD-10-CM | POA: Diagnosis not present

## 2023-09-25 DIAGNOSIS — D849 Immunodeficiency, unspecified: Secondary | ICD-10-CM | POA: Diagnosis not present

## 2023-09-25 DIAGNOSIS — Z4822 Encounter for aftercare following kidney transplant: Secondary | ICD-10-CM | POA: Diagnosis not present

## 2023-09-25 DIAGNOSIS — Z87891 Personal history of nicotine dependence: Secondary | ICD-10-CM | POA: Diagnosis not present

## 2023-09-25 DIAGNOSIS — R911 Solitary pulmonary nodule: Secondary | ICD-10-CM | POA: Diagnosis not present

## 2023-09-25 DIAGNOSIS — Z940 Kidney transplant status: Secondary | ICD-10-CM | POA: Diagnosis not present

## 2023-09-25 DIAGNOSIS — I129 Hypertensive chronic kidney disease with stage 1 through stage 4 chronic kidney disease, or unspecified chronic kidney disease: Secondary | ICD-10-CM | POA: Diagnosis not present

## 2023-09-25 DIAGNOSIS — K2289 Other specified disease of esophagus: Secondary | ICD-10-CM | POA: Diagnosis not present

## 2023-09-25 DIAGNOSIS — M329 Systemic lupus erythematosus, unspecified: Secondary | ICD-10-CM | POA: Diagnosis not present

## 2023-09-25 DIAGNOSIS — K449 Diaphragmatic hernia without obstruction or gangrene: Secondary | ICD-10-CM | POA: Diagnosis not present

## 2023-09-25 DIAGNOSIS — N184 Chronic kidney disease, stage 4 (severe): Secondary | ICD-10-CM | POA: Diagnosis not present

## 2023-09-28 ENCOUNTER — Other Ambulatory Visit (HOSPITAL_COMMUNITY)
Admission: RE | Admit: 2023-09-28 | Discharge: 2023-09-28 | Disposition: A | Discharge status: Home / Self Care | Payer: 59 | Source: Ambulatory Visit | Attending: Nephrology | Admitting: Nephrology

## 2023-09-28 DIAGNOSIS — N390 Urinary tract infection, site not specified: Secondary | ICD-10-CM | POA: Insufficient documentation

## 2023-09-28 DIAGNOSIS — Z940 Kidney transplant status: Secondary | ICD-10-CM | POA: Insufficient documentation

## 2023-09-28 DIAGNOSIS — B259 Cytomegaloviral disease, unspecified: Secondary | ICD-10-CM | POA: Insufficient documentation

## 2023-09-28 DIAGNOSIS — Z79899 Other long term (current) drug therapy: Secondary | ICD-10-CM | POA: Insufficient documentation

## 2023-09-28 DIAGNOSIS — E559 Vitamin D deficiency, unspecified: Secondary | ICD-10-CM | POA: Diagnosis not present

## 2023-09-28 DIAGNOSIS — D899 Disorder involving the immune mechanism, unspecified: Secondary | ICD-10-CM | POA: Diagnosis not present

## 2023-09-28 DIAGNOSIS — Z114 Encounter for screening for human immunodeficiency virus [HIV]: Secondary | ICD-10-CM | POA: Diagnosis not present

## 2023-09-28 DIAGNOSIS — T8610 Unspecified complication of kidney transplant: Secondary | ICD-10-CM | POA: Insufficient documentation

## 2023-09-28 DIAGNOSIS — D631 Anemia in chronic kidney disease: Secondary | ICD-10-CM | POA: Insufficient documentation

## 2023-09-28 DIAGNOSIS — Z789 Other specified health status: Secondary | ICD-10-CM | POA: Diagnosis not present

## 2023-09-28 DIAGNOSIS — Z9483 Pancreas transplant status: Secondary | ICD-10-CM | POA: Diagnosis not present

## 2023-09-28 DIAGNOSIS — E1129 Type 2 diabetes mellitus with other diabetic kidney complication: Secondary | ICD-10-CM | POA: Diagnosis not present

## 2023-09-28 LAB — CBC WITH DIFFERENTIAL/PLATELET
Abs Immature Granulocytes: 0 10*3/uL (ref 0.00–0.07)
Basophils Absolute: 0 10*3/uL (ref 0.0–0.1)
Basophils Relative: 2 %
Eosinophils Absolute: 0.1 10*3/uL (ref 0.0–0.5)
Eosinophils Relative: 4 %
HCT: 36.2 % (ref 36.0–46.0)
Hemoglobin: 11.2 g/dL — ABNORMAL LOW (ref 12.0–15.0)
Lymphocytes Relative: 40 %
Lymphs Abs: 0.6 10*3/uL — ABNORMAL LOW (ref 0.7–4.0)
MCH: 28.1 pg (ref 26.0–34.0)
MCHC: 30.9 g/dL (ref 30.0–36.0)
MCV: 91 fL (ref 80.0–100.0)
Monocytes Absolute: 0.3 10*3/uL (ref 0.1–1.0)
Monocytes Relative: 19 %
Neutro Abs: 0.5 10*3/uL — ABNORMAL LOW (ref 1.7–7.7)
Neutrophils Relative %: 35 %
Platelets: 256 10*3/uL (ref 150–400)
RBC: 3.98 MIL/uL (ref 3.87–5.11)
RDW: 18.9 % — ABNORMAL HIGH (ref 11.5–15.5)
WBC: 1.4 10*3/uL — CL (ref 4.0–10.5)
nRBC: 0 % (ref 0.0–0.2)

## 2023-09-28 LAB — URINALYSIS, COMPLETE (UACMP) WITH MICROSCOPIC
Bacteria, UA: NONE SEEN
Bilirubin Urine: NEGATIVE
Glucose, UA: NEGATIVE mg/dL
Hgb urine dipstick: NEGATIVE
Ketones, ur: NEGATIVE mg/dL
Leukocytes,Ua: NEGATIVE
Nitrite: NEGATIVE
Protein, ur: NEGATIVE mg/dL
Specific Gravity, Urine: 1.003 — ABNORMAL LOW (ref 1.005–1.030)
pH: 6 (ref 5.0–8.0)

## 2023-09-28 LAB — BASIC METABOLIC PANEL
Anion gap: 10 (ref 5–15)
BUN: 17 mg/dL (ref 8–23)
CO2: 21 mmol/L — ABNORMAL LOW (ref 22–32)
Calcium: 9 mg/dL (ref 8.9–10.3)
Chloride: 100 mmol/L (ref 98–111)
Creatinine, Ser: 1 mg/dL (ref 0.44–1.00)
GFR, Estimated: >60 mL/min (ref >60)
Glucose, Bld: 85 mg/dL (ref 70–99)
Potassium: 4.7 mmol/L (ref 3.5–5.1)
Sodium: 131 mmol/L — ABNORMAL LOW (ref 135–145)

## 2023-09-28 LAB — PROTEIN / CREATININE RATIO, URINE
Creatinine, Urine: 19 mg/dL
Total Protein, Urine: <6 mg/dL

## 2023-09-28 LAB — PHOSPHORUS: Phosphorus: 3.5 mg/dL (ref 2.5–4.6)

## 2023-09-28 LAB — MAGNESIUM: Magnesium: 2 mg/dL (ref 1.7–2.4)

## 2023-09-29 DIAGNOSIS — D708 Other neutropenia: Secondary | ICD-10-CM | POA: Diagnosis not present

## 2023-09-29 LAB — URINE CULTURE

## 2023-09-29 LAB — MICROALBUMIN / CREATININE URINE RATIO
Creatinine, Urine: 17.3 mg/dL
Microalb Creat Ratio: <17 mg/g{creat} (ref 0–29)
Microalb, Ur: <3 ug/mL — ABNORMAL HIGH

## 2023-09-29 LAB — HCV RNA DIAGNOSIS, NAA: HCV RNA, Quantitation: NOT DETECTED [IU]/mL

## 2023-09-29 LAB — TACROLIMUS LEVEL: Tacrolimus (FK506) - LabCorp: 6.9 ng/mL (ref 2.0–20.0)

## 2023-10-05 ENCOUNTER — Other Ambulatory Visit (INDEPENDENT_AMBULATORY_CARE_PROVIDER_SITE_OTHER): Payer: Self-pay | Admitting: Nurse Practitioner

## 2023-10-05 ENCOUNTER — Other Ambulatory Visit (HOSPITAL_COMMUNITY)
Admission: RE | Admit: 2023-10-05 | Discharge: 2023-10-05 | Disposition: A | Discharge status: Home / Self Care | Payer: 59 | Source: Ambulatory Visit | Attending: Nephrology | Admitting: Nephrology

## 2023-10-05 DIAGNOSIS — Z789 Other specified health status: Secondary | ICD-10-CM | POA: Diagnosis not present

## 2023-10-05 DIAGNOSIS — D899 Disorder involving the immune mechanism, unspecified: Secondary | ICD-10-CM | POA: Insufficient documentation

## 2023-10-05 DIAGNOSIS — Z940 Kidney transplant status: Secondary | ICD-10-CM | POA: Diagnosis not present

## 2023-10-05 DIAGNOSIS — D631 Anemia in chronic kidney disease: Secondary | ICD-10-CM | POA: Insufficient documentation

## 2023-10-05 DIAGNOSIS — Z79899 Other long term (current) drug therapy: Secondary | ICD-10-CM | POA: Diagnosis not present

## 2023-10-05 DIAGNOSIS — Z4822 Encounter for aftercare following kidney transplant: Secondary | ICD-10-CM | POA: Diagnosis not present

## 2023-10-05 DIAGNOSIS — E1129 Type 2 diabetes mellitus with other diabetic kidney complication: Secondary | ICD-10-CM | POA: Insufficient documentation

## 2023-10-05 DIAGNOSIS — E559 Vitamin D deficiency, unspecified: Secondary | ICD-10-CM | POA: Insufficient documentation

## 2023-10-05 DIAGNOSIS — N186 End stage renal disease: Secondary | ICD-10-CM

## 2023-10-05 DIAGNOSIS — N390 Urinary tract infection, site not specified: Secondary | ICD-10-CM | POA: Diagnosis present

## 2023-10-05 DIAGNOSIS — B259 Cytomegaloviral disease, unspecified: Secondary | ICD-10-CM | POA: Diagnosis present

## 2023-10-05 LAB — URINALYSIS, ROUTINE W REFLEX MICROSCOPIC
Bacteria, UA: NONE SEEN
Bilirubin Urine: NEGATIVE
Glucose, UA: NEGATIVE mg/dL
Hgb urine dipstick: NEGATIVE
Ketones, ur: NEGATIVE mg/dL
Leukocytes,Ua: NEGATIVE
Nitrite: NEGATIVE
Protein, ur: NEGATIVE mg/dL
Specific Gravity, Urine: 1.006 (ref 1.005–1.030)
pH: 5 (ref 5.0–8.0)

## 2023-10-05 LAB — CBC WITH DIFFERENTIAL/PLATELET
Abs Immature Granulocytes: 0.09 10*3/uL — ABNORMAL HIGH (ref 0.00–0.07)
Basophils Absolute: 0 10*3/uL (ref 0.0–0.1)
Basophils Relative: 1 %
Eosinophils Absolute: 0.1 10*3/uL (ref 0.0–0.5)
Eosinophils Relative: 3 %
HCT: 36.4 % (ref 36.0–46.0)
Hemoglobin: 11.1 g/dL — ABNORMAL LOW (ref 12.0–15.0)
Immature Granulocytes: 3 %
Lymphocytes Relative: 27 %
Lymphs Abs: 0.7 10*3/uL (ref 0.7–4.0)
MCH: 28 pg (ref 26.0–34.0)
MCHC: 30.5 g/dL (ref 30.0–36.0)
MCV: 91.7 fL (ref 80.0–100.0)
Monocytes Absolute: 0.8 10*3/uL (ref 0.1–1.0)
Monocytes Relative: 32 %
Neutro Abs: 0.9 10*3/uL — ABNORMAL LOW (ref 1.7–7.7)
Neutrophils Relative %: 34 %
Platelets: 305 10*3/uL (ref 150–400)
RBC: 3.97 MIL/uL (ref 3.87–5.11)
RDW: 18.3 % — ABNORMAL HIGH (ref 11.5–15.5)
WBC: 2.6 10*3/uL — ABNORMAL LOW (ref 4.0–10.5)
nRBC: 0 % (ref 0.0–0.2)

## 2023-10-05 LAB — PROTEIN / CREATININE RATIO, URINE
Creatinine, Urine: 33 mg/dL
Total Protein, Urine: <6 mg/dL

## 2023-10-05 LAB — BASIC METABOLIC PANEL
Anion gap: 8 (ref 5–15)
BUN: 31 mg/dL — ABNORMAL HIGH (ref 8–23)
CO2: 22 mmol/L (ref 22–32)
Calcium: 9 mg/dL (ref 8.9–10.3)
Chloride: 101 mmol/L (ref 98–111)
Creatinine, Ser: 1.24 mg/dL — ABNORMAL HIGH (ref 0.44–1.00)
GFR, Estimated: 47 mL/min — ABNORMAL LOW (ref >60)
Glucose, Bld: 100 mg/dL — ABNORMAL HIGH (ref 70–99)
Potassium: 4.5 mmol/L (ref 3.5–5.1)
Sodium: 131 mmol/L — ABNORMAL LOW (ref 135–145)

## 2023-10-05 LAB — MAGNESIUM: Magnesium: 1.7 mg/dL (ref 1.7–2.4)

## 2023-10-05 LAB — PHOSPHORUS: Phosphorus: 3.9 mg/dL (ref 2.5–4.6)

## 2023-10-06 LAB — MICROALBUMIN / CREATININE URINE RATIO
Creatinine, Urine: 25.9 mg/dL
Microalb Creat Ratio: <12 mg/g{creat} (ref 0–29)
Microalb, Ur: <3 ug/mL — ABNORMAL HIGH

## 2023-10-07 ENCOUNTER — Encounter (INDEPENDENT_AMBULATORY_CARE_PROVIDER_SITE_OTHER): Payer: 59

## 2023-10-07 ENCOUNTER — Ambulatory Visit (INDEPENDENT_AMBULATORY_CARE_PROVIDER_SITE_OTHER): Payer: 59 | Admitting: Nurse Practitioner

## 2023-10-07 LAB — TACROLIMUS LEVEL: Tacrolimus (FK506) - LabCorp: 18.8 ng/mL (ref 2.0–20.0)

## 2023-10-07 LAB — URINE CULTURE: Culture: 20000 — AB

## 2023-10-12 ENCOUNTER — Other Ambulatory Visit (HOSPITAL_COMMUNITY)
Admission: RE | Admit: 2023-10-12 | Discharge: 2023-10-12 | Disposition: A | Discharge status: Home / Self Care | Payer: 59 | Source: Ambulatory Visit | Attending: Nephrology | Admitting: Nephrology

## 2023-10-12 DIAGNOSIS — Z79899 Other long term (current) drug therapy: Secondary | ICD-10-CM | POA: Insufficient documentation

## 2023-10-12 DIAGNOSIS — D631 Anemia in chronic kidney disease: Secondary | ICD-10-CM | POA: Insufficient documentation

## 2023-10-12 DIAGNOSIS — E559 Vitamin D deficiency, unspecified: Secondary | ICD-10-CM | POA: Insufficient documentation

## 2023-10-12 DIAGNOSIS — E1129 Type 2 diabetes mellitus with other diabetic kidney complication: Secondary | ICD-10-CM | POA: Diagnosis not present

## 2023-10-12 DIAGNOSIS — D899 Disorder involving the immune mechanism, unspecified: Secondary | ICD-10-CM | POA: Insufficient documentation

## 2023-10-12 DIAGNOSIS — Z9483 Pancreas transplant status: Secondary | ICD-10-CM | POA: Insufficient documentation

## 2023-10-12 DIAGNOSIS — Z789 Other specified health status: Secondary | ICD-10-CM | POA: Diagnosis not present

## 2023-10-12 DIAGNOSIS — Z940 Kidney transplant status: Secondary | ICD-10-CM | POA: Diagnosis not present

## 2023-10-12 DIAGNOSIS — Z48288 Encounter for aftercare following multiple organ transplant: Secondary | ICD-10-CM | POA: Diagnosis not present

## 2023-10-12 LAB — URINALYSIS, COMPLETE (UACMP) WITH MICROSCOPIC
Bacteria, UA: NONE SEEN
Bilirubin Urine: NEGATIVE
Glucose, UA: NEGATIVE mg/dL
Hgb urine dipstick: NEGATIVE
Ketones, ur: NEGATIVE mg/dL
Leukocytes,Ua: NEGATIVE
Nitrite: NEGATIVE
Protein, ur: NEGATIVE mg/dL
Specific Gravity, Urine: 1.006 (ref 1.005–1.030)
pH: 5 (ref 5.0–8.0)

## 2023-10-12 LAB — BASIC METABOLIC PANEL
Anion gap: 10 (ref 5–15)
BUN: 30 mg/dL — ABNORMAL HIGH (ref 8–23)
CO2: 18 mmol/L — ABNORMAL LOW (ref 22–32)
Calcium: 9.3 mg/dL (ref 8.9–10.3)
Chloride: 101 mmol/L (ref 98–111)
Creatinine, Ser: 1.16 mg/dL — ABNORMAL HIGH (ref 0.44–1.00)
GFR, Estimated: 51 mL/min — ABNORMAL LOW (ref >60)
Glucose, Bld: 87 mg/dL (ref 70–99)
Potassium: 5.1 mmol/L (ref 3.5–5.1)
Sodium: 129 mmol/L — ABNORMAL LOW (ref 135–145)

## 2023-10-12 LAB — CBC WITH DIFFERENTIAL/PLATELET
Abs Immature Granulocytes: 0.02 10*3/uL (ref 0.00–0.07)
Basophils Absolute: 0 10*3/uL (ref 0.0–0.1)
Basophils Relative: 1 %
Eosinophils Absolute: 0.1 10*3/uL (ref 0.0–0.5)
Eosinophils Relative: 3 %
HCT: 34 % — ABNORMAL LOW (ref 36.0–46.0)
Hemoglobin: 10.7 g/dL — ABNORMAL LOW (ref 12.0–15.0)
Immature Granulocytes: 1 %
Lymphocytes Relative: 23 %
Lymphs Abs: 0.6 10*3/uL — ABNORMAL LOW (ref 0.7–4.0)
MCH: 27.9 pg (ref 26.0–34.0)
MCHC: 31.5 g/dL (ref 30.0–36.0)
MCV: 88.5 fL (ref 80.0–100.0)
Monocytes Absolute: 0.9 10*3/uL (ref 0.1–1.0)
Monocytes Relative: 32 %
Neutro Abs: 1.1 10*3/uL — ABNORMAL LOW (ref 1.7–7.7)
Neutrophils Relative %: 40 %
Platelets: 265 10*3/uL (ref 150–400)
RBC: 3.84 MIL/uL — ABNORMAL LOW (ref 3.87–5.11)
RDW: 17.7 % — ABNORMAL HIGH (ref 11.5–15.5)
WBC: 2.7 10*3/uL — ABNORMAL LOW (ref 4.0–10.5)
nRBC: 0 % (ref 0.0–0.2)

## 2023-10-12 LAB — MAGNESIUM: Magnesium: 1.8 mg/dL (ref 1.7–2.4)

## 2023-10-12 LAB — PROTEIN / CREATININE RATIO, URINE
Creatinine, Urine: 40 mg/dL
Total Protein, Urine: <6 mg/dL

## 2023-10-12 LAB — PHOSPHORUS: Phosphorus: 4.1 mg/dL (ref 2.5–4.6)

## 2023-10-13 LAB — MICROALBUMIN / CREATININE URINE RATIO
Creatinine, Urine: 35.6 mg/dL
Microalb Creat Ratio: <8 mg/g{creat} (ref 0–29)
Microalb, Ur: <3 ug/mL — ABNORMAL HIGH

## 2023-10-14 LAB — URINE CULTURE: Culture: >=100000 — AB

## 2023-10-19 ENCOUNTER — Other Ambulatory Visit (HOSPITAL_COMMUNITY)
Admission: RE | Admit: 2023-10-19 | Discharge: 2023-10-19 | Disposition: A | Discharge status: Home / Self Care | Payer: 59 | Source: Ambulatory Visit | Attending: Family Medicine | Admitting: Family Medicine

## 2023-10-19 DIAGNOSIS — N189 Chronic kidney disease, unspecified: Secondary | ICD-10-CM | POA: Insufficient documentation

## 2023-10-19 DIAGNOSIS — D899 Disorder involving the immune mechanism, unspecified: Secondary | ICD-10-CM | POA: Insufficient documentation

## 2023-10-19 DIAGNOSIS — E559 Vitamin D deficiency, unspecified: Secondary | ICD-10-CM | POA: Diagnosis not present

## 2023-10-19 DIAGNOSIS — B9689 Other specified bacterial agents as the cause of diseases classified elsewhere: Secondary | ICD-10-CM | POA: Diagnosis not present

## 2023-10-19 DIAGNOSIS — Z09 Encounter for follow-up examination after completed treatment for conditions other than malignant neoplasm: Secondary | ICD-10-CM | POA: Insufficient documentation

## 2023-10-19 DIAGNOSIS — D631 Anemia in chronic kidney disease: Secondary | ICD-10-CM | POA: Insufficient documentation

## 2023-10-19 DIAGNOSIS — E1129 Type 2 diabetes mellitus with other diabetic kidney complication: Secondary | ICD-10-CM | POA: Insufficient documentation

## 2023-10-19 DIAGNOSIS — Z789 Other specified health status: Secondary | ICD-10-CM | POA: Diagnosis not present

## 2023-10-19 DIAGNOSIS — N390 Urinary tract infection, site not specified: Secondary | ICD-10-CM | POA: Diagnosis not present

## 2023-10-19 DIAGNOSIS — Z940 Kidney transplant status: Secondary | ICD-10-CM | POA: Diagnosis not present

## 2023-10-19 DIAGNOSIS — Z114 Encounter for screening for human immunodeficiency virus [HIV]: Secondary | ICD-10-CM | POA: Diagnosis not present

## 2023-10-19 DIAGNOSIS — Z79899 Other long term (current) drug therapy: Secondary | ICD-10-CM | POA: Insufficient documentation

## 2023-10-19 DIAGNOSIS — B259 Cytomegaloviral disease, unspecified: Secondary | ICD-10-CM | POA: Diagnosis not present

## 2023-10-19 DIAGNOSIS — Z9483 Pancreas transplant status: Secondary | ICD-10-CM | POA: Insufficient documentation

## 2023-10-19 LAB — CBC WITH DIFFERENTIAL/PLATELET
Abs Immature Granulocytes: 0.03 10*3/uL (ref 0.00–0.07)
Basophils Absolute: 0 10*3/uL (ref 0.0–0.1)
Basophils Relative: 1 %
Eosinophils Absolute: 0.1 10*3/uL (ref 0.0–0.5)
Eosinophils Relative: 5 %
HCT: 34.6 % — ABNORMAL LOW (ref 36.0–46.0)
Hemoglobin: 10.9 g/dL — ABNORMAL LOW (ref 12.0–15.0)
Immature Granulocytes: 1 %
Lymphocytes Relative: 27 %
Lymphs Abs: 0.7 10*3/uL (ref 0.7–4.0)
MCH: 28.2 pg (ref 26.0–34.0)
MCHC: 31.5 g/dL (ref 30.0–36.0)
MCV: 89.6 fL (ref 80.0–100.0)
Monocytes Absolute: 0.8 10*3/uL (ref 0.1–1.0)
Monocytes Relative: 29 %
Neutro Abs: 1 10*3/uL — ABNORMAL LOW (ref 1.7–7.7)
Neutrophils Relative %: 37 %
Platelets: 256 10*3/uL (ref 150–400)
RBC: 3.86 MIL/uL — ABNORMAL LOW (ref 3.87–5.11)
RDW: 17.4 % — ABNORMAL HIGH (ref 11.5–15.5)
WBC: 2.8 10*3/uL — ABNORMAL LOW (ref 4.0–10.5)
nRBC: 0 % (ref 0.0–0.2)

## 2023-10-19 LAB — BASIC METABOLIC PANEL
Anion gap: 10 (ref 5–15)
BUN: 30 mg/dL — ABNORMAL HIGH (ref 8–23)
CO2: 20 mmol/L — ABNORMAL LOW (ref 22–32)
Calcium: 9 mg/dL (ref 8.9–10.3)
Chloride: 101 mmol/L (ref 98–111)
Creatinine, Ser: 1.15 mg/dL — ABNORMAL HIGH (ref 0.44–1.00)
GFR, Estimated: 51 mL/min — ABNORMAL LOW (ref >60)
Glucose, Bld: 89 mg/dL (ref 70–99)
Potassium: 4.2 mmol/L (ref 3.5–5.1)
Sodium: 131 mmol/L — ABNORMAL LOW (ref 135–145)

## 2023-10-19 LAB — PHOSPHORUS: Phosphorus: 3.6 mg/dL (ref 2.5–4.6)

## 2023-10-19 LAB — MAGNESIUM: Magnesium: 1.7 mg/dL (ref 1.7–2.4)

## 2023-10-21 LAB — TACROLIMUS LEVEL: Tacrolimus (FK506) - LabCorp: 10 ng/mL (ref 2.0–20.0)

## 2023-10-26 ENCOUNTER — Other Ambulatory Visit (HOSPITAL_COMMUNITY)
Admission: RE | Admit: 2023-10-26 | Discharge: 2023-10-26 | Disposition: A | Discharge status: Home / Self Care | Payer: 59 | Source: Ambulatory Visit | Attending: Nephrology | Admitting: Nephrology

## 2023-10-26 DIAGNOSIS — E559 Vitamin D deficiency, unspecified: Secondary | ICD-10-CM | POA: Diagnosis not present

## 2023-10-26 DIAGNOSIS — Z940 Kidney transplant status: Secondary | ICD-10-CM | POA: Insufficient documentation

## 2023-10-26 DIAGNOSIS — D899 Disorder involving the immune mechanism, unspecified: Secondary | ICD-10-CM | POA: Diagnosis not present

## 2023-10-26 DIAGNOSIS — Z48288 Encounter for aftercare following multiple organ transplant: Secondary | ICD-10-CM | POA: Diagnosis not present

## 2023-10-26 DIAGNOSIS — Z789 Other specified health status: Secondary | ICD-10-CM | POA: Diagnosis not present

## 2023-10-26 DIAGNOSIS — D631 Anemia in chronic kidney disease: Secondary | ICD-10-CM | POA: Diagnosis not present

## 2023-10-26 DIAGNOSIS — Z9483 Pancreas transplant status: Secondary | ICD-10-CM | POA: Insufficient documentation

## 2023-10-26 DIAGNOSIS — E1129 Type 2 diabetes mellitus with other diabetic kidney complication: Secondary | ICD-10-CM | POA: Diagnosis not present

## 2023-10-26 DIAGNOSIS — Z79899 Other long term (current) drug therapy: Secondary | ICD-10-CM | POA: Diagnosis not present

## 2023-10-26 LAB — BASIC METABOLIC PANEL
Anion gap: 10 (ref 5–15)
BUN: 28 mg/dL — ABNORMAL HIGH (ref 8–23)
CO2: 21 mmol/L — ABNORMAL LOW (ref 22–32)
Calcium: 9.1 mg/dL (ref 8.9–10.3)
Chloride: 102 mmol/L (ref 98–111)
Creatinine, Ser: 1.15 mg/dL — ABNORMAL HIGH (ref 0.44–1.00)
GFR, Estimated: 51 mL/min — ABNORMAL LOW (ref >60)
Glucose, Bld: 80 mg/dL (ref 70–99)
Potassium: 4.3 mmol/L (ref 3.5–5.1)
Sodium: 133 mmol/L — ABNORMAL LOW (ref 135–145)

## 2023-10-26 LAB — URINALYSIS, COMPLETE (UACMP) WITH MICROSCOPIC
Bacteria, UA: NONE SEEN
Bilirubin Urine: NEGATIVE
Glucose, UA: NEGATIVE mg/dL
Hgb urine dipstick: NEGATIVE
Ketones, ur: NEGATIVE mg/dL
Leukocytes,Ua: NEGATIVE
Nitrite: NEGATIVE
Protein, ur: NEGATIVE mg/dL
Specific Gravity, Urine: 1.004 — ABNORMAL LOW (ref 1.005–1.030)
pH: 6 (ref 5.0–8.0)

## 2023-10-26 LAB — DIFFERENTIAL
Abs Immature Granulocytes: 0.01 10*3/uL (ref 0.00–0.07)
Basophils Absolute: 0 10*3/uL (ref 0.0–0.1)
Basophils Relative: 1 %
Eosinophils Absolute: 0.3 10*3/uL (ref 0.0–0.5)
Eosinophils Relative: 8 %
Immature Granulocytes: 0 %
Lymphocytes Relative: 25 %
Lymphs Abs: 0.8 10*3/uL (ref 0.7–4.0)
Monocytes Absolute: 0.8 10*3/uL (ref 0.1–1.0)
Monocytes Relative: 26 %
Neutro Abs: 1.2 10*3/uL — ABNORMAL LOW (ref 1.7–7.7)
Neutrophils Relative %: 40 %

## 2023-10-26 LAB — CBC
HCT: 34.6 % — ABNORMAL LOW (ref 36.0–46.0)
Hemoglobin: 10.7 g/dL — ABNORMAL LOW (ref 12.0–15.0)
MCH: 27.9 pg (ref 26.0–34.0)
MCHC: 30.9 g/dL (ref 30.0–36.0)
MCV: 90.1 fL (ref 80.0–100.0)
Platelets: 223 10*3/uL (ref 150–400)
RBC: 3.84 MIL/uL — ABNORMAL LOW (ref 3.87–5.11)
RDW: 17.2 % — ABNORMAL HIGH (ref 11.5–15.5)
WBC: 3.1 10*3/uL — ABNORMAL LOW (ref 4.0–10.5)
nRBC: 0 % (ref 0.0–0.2)

## 2023-10-26 LAB — MAGNESIUM: Magnesium: 1.7 mg/dL (ref 1.7–2.4)

## 2023-10-26 LAB — PROTEIN / CREATININE RATIO, URINE
Creatinine, Urine: 18 mg/dL
Total Protein, Urine: <6 mg/dL

## 2023-10-26 LAB — PHOSPHORUS: Phosphorus: 4.1 mg/dL (ref 2.5–4.6)

## 2023-10-27 LAB — MICROALBUMIN / CREATININE URINE RATIO
Creatinine, Urine: 16 mg/dL
Microalb Creat Ratio: <19 mg/g{creat} (ref 0–29)
Microalb, Ur: <3 ug/mL — ABNORMAL HIGH

## 2023-10-27 LAB — URINE CULTURE: Culture: NO GROWTH

## 2023-10-27 LAB — HCV RNA DIAGNOSIS, NAA: HCV RNA, Quantitation: NOT DETECTED [IU]/mL

## 2023-10-28 LAB — TACROLIMUS LEVEL: Tacrolimus (FK506) - LabCorp: 9.7 ng/mL (ref 2.0–20.0)

## 2023-11-03 ENCOUNTER — Other Ambulatory Visit (HOSPITAL_COMMUNITY)
Admission: RE | Admit: 2023-11-03 | Discharge: 2023-11-03 | Disposition: A | Discharge status: Home / Self Care | Payer: 59 | Source: Ambulatory Visit | Attending: Nephrology | Admitting: Nephrology

## 2023-11-03 DIAGNOSIS — Z940 Kidney transplant status: Secondary | ICD-10-CM | POA: Diagnosis not present

## 2023-11-03 DIAGNOSIS — E1129 Type 2 diabetes mellitus with other diabetic kidney complication: Secondary | ICD-10-CM | POA: Diagnosis not present

## 2023-11-03 DIAGNOSIS — T8610 Unspecified complication of kidney transplant: Secondary | ICD-10-CM | POA: Diagnosis not present

## 2023-11-03 DIAGNOSIS — Z789 Other specified health status: Secondary | ICD-10-CM | POA: Insufficient documentation

## 2023-11-03 DIAGNOSIS — D899 Disorder involving the immune mechanism, unspecified: Secondary | ICD-10-CM | POA: Diagnosis not present

## 2023-11-03 DIAGNOSIS — B259 Cytomegaloviral disease, unspecified: Secondary | ICD-10-CM | POA: Diagnosis present

## 2023-11-03 DIAGNOSIS — Z9483 Pancreas transplant status: Secondary | ICD-10-CM | POA: Insufficient documentation

## 2023-11-03 DIAGNOSIS — D631 Anemia in chronic kidney disease: Secondary | ICD-10-CM | POA: Diagnosis not present

## 2023-11-03 DIAGNOSIS — Z79899 Other long term (current) drug therapy: Secondary | ICD-10-CM | POA: Insufficient documentation

## 2023-11-03 DIAGNOSIS — Z48288 Encounter for aftercare following multiple organ transplant: Secondary | ICD-10-CM | POA: Diagnosis not present

## 2023-11-03 DIAGNOSIS — N390 Urinary tract infection, site not specified: Secondary | ICD-10-CM | POA: Insufficient documentation

## 2023-11-03 DIAGNOSIS — E559 Vitamin D deficiency, unspecified: Secondary | ICD-10-CM | POA: Insufficient documentation

## 2023-11-03 DIAGNOSIS — Z114 Encounter for screening for human immunodeficiency virus [HIV]: Secondary | ICD-10-CM | POA: Diagnosis present

## 2023-11-03 LAB — BASIC METABOLIC PANEL
Anion gap: 9 (ref 5–15)
BUN: 28 mg/dL — ABNORMAL HIGH (ref 8–23)
CO2: 20 mmol/L — ABNORMAL LOW (ref 22–32)
Calcium: 9.5 mg/dL (ref 8.9–10.3)
Chloride: 106 mmol/L (ref 98–111)
Creatinine, Ser: 1.16 mg/dL — ABNORMAL HIGH (ref 0.44–1.00)
GFR, Estimated: 51 mL/min — ABNORMAL LOW (ref >60)
Glucose, Bld: 87 mg/dL (ref 70–99)
Potassium: 4.2 mmol/L (ref 3.5–5.1)
Sodium: 135 mmol/L (ref 135–145)

## 2023-11-03 LAB — CBC WITH DIFFERENTIAL/PLATELET
Abs Immature Granulocytes: 0.01 10*3/uL (ref 0.00–0.07)
Basophils Absolute: 0 10*3/uL (ref 0.0–0.1)
Basophils Relative: 1 %
Eosinophils Absolute: 0.2 10*3/uL (ref 0.0–0.5)
Eosinophils Relative: 6 %
HCT: 37.4 % (ref 36.0–46.0)
Hemoglobin: 11.3 g/dL — ABNORMAL LOW (ref 12.0–15.0)
Immature Granulocytes: 0 %
Lymphocytes Relative: 27 %
Lymphs Abs: 0.8 10*3/uL (ref 0.7–4.0)
MCH: 27.8 pg (ref 26.0–34.0)
MCHC: 30.2 g/dL (ref 30.0–36.0)
MCV: 92.1 fL (ref 80.0–100.0)
Monocytes Absolute: 0.7 10*3/uL (ref 0.1–1.0)
Monocytes Relative: 23 %
Neutro Abs: 1.3 10*3/uL — ABNORMAL LOW (ref 1.7–7.7)
Neutrophils Relative %: 43 %
Platelets: 218 10*3/uL (ref 150–400)
RBC: 4.06 MIL/uL (ref 3.87–5.11)
RDW: 16.9 % — ABNORMAL HIGH (ref 11.5–15.5)
WBC: 3.1 10*3/uL — ABNORMAL LOW (ref 4.0–10.5)
nRBC: 0 % (ref 0.0–0.2)

## 2023-11-03 LAB — MAGNESIUM: Magnesium: 1.7 mg/dL (ref 1.7–2.4)

## 2023-11-03 LAB — PHOSPHORUS: Phosphorus: 4.1 mg/dL (ref 2.5–4.6)

## 2023-11-04 LAB — PROTEIN / CREATININE RATIO, URINE
Creatinine, Urine: 44 mg/dL
Total Protein, Urine: <6 mg/dL

## 2023-11-04 LAB — URINALYSIS, COMPLETE (UACMP) WITH MICROSCOPIC
Bacteria, UA: NONE SEEN
Bilirubin Urine: NEGATIVE
Glucose, UA: NEGATIVE mg/dL
Hgb urine dipstick: NEGATIVE
Ketones, ur: NEGATIVE mg/dL
Leukocytes,Ua: NEGATIVE
Nitrite: NEGATIVE
Protein, ur: NEGATIVE mg/dL
Specific Gravity, Urine: 1.01 (ref 1.005–1.030)
pH: 5 (ref 5.0–8.0)

## 2023-11-05 LAB — TACROLIMUS LEVEL: Tacrolimus (FK506) - LabCorp: 10.4 ng/mL (ref 2.0–20.0)

## 2023-11-06 LAB — URINE CULTURE: Culture: >=100000 — AB

## 2023-11-10 ENCOUNTER — Other Ambulatory Visit (HOSPITAL_COMMUNITY)
Admission: RE | Admit: 2023-11-10 | Discharge: 2023-11-10 | Disposition: A | Discharge status: Home / Self Care | Payer: 59 | Source: Ambulatory Visit | Attending: Nephrology | Admitting: Nephrology

## 2023-11-10 DIAGNOSIS — Z940 Kidney transplant status: Secondary | ICD-10-CM | POA: Diagnosis not present

## 2023-11-10 LAB — CBC WITH DIFFERENTIAL/PLATELET
Abs Immature Granulocytes: 0 10*3/uL (ref 0.00–0.07)
Basophils Absolute: 0 10*3/uL (ref 0.0–0.1)
Basophils Relative: 2 %
Eosinophils Absolute: 0.2 10*3/uL (ref 0.0–0.5)
Eosinophils Relative: 8 %
HCT: 36.6 % (ref 36.0–46.0)
Hemoglobin: 11.5 g/dL — ABNORMAL LOW (ref 12.0–15.0)
Immature Granulocytes: 0 %
Lymphocytes Relative: 26 %
Lymphs Abs: 0.7 10*3/uL (ref 0.7–4.0)
MCH: 28.3 pg (ref 26.0–34.0)
MCHC: 31.4 g/dL (ref 30.0–36.0)
MCV: 90.1 fL (ref 80.0–100.0)
Monocytes Absolute: 0.7 10*3/uL (ref 0.1–1.0)
Monocytes Relative: 26 %
Neutro Abs: 1.1 10*3/uL — ABNORMAL LOW (ref 1.7–7.7)
Neutrophils Relative %: 38 %
Platelets: 213 10*3/uL (ref 150–400)
RBC: 4.06 MIL/uL (ref 3.87–5.11)
RDW: 16 % — ABNORMAL HIGH (ref 11.5–15.5)
WBC: 2.7 10*3/uL — ABNORMAL LOW (ref 4.0–10.5)
nRBC: 0 % (ref 0.0–0.2)

## 2023-11-10 LAB — BASIC METABOLIC PANEL
Anion gap: 9 (ref 5–15)
BUN: 30 mg/dL — ABNORMAL HIGH (ref 8–23)
CO2: 22 mmol/L (ref 22–32)
Calcium: 9.5 mg/dL (ref 8.9–10.3)
Chloride: 104 mmol/L (ref 98–111)
Creatinine, Ser: 1.06 mg/dL — ABNORMAL HIGH (ref 0.44–1.00)
GFR, Estimated: 57 mL/min — ABNORMAL LOW (ref >60)
Glucose, Bld: 97 mg/dL (ref 70–99)
Potassium: 4 mmol/L (ref 3.5–5.1)
Sodium: 135 mmol/L (ref 135–145)

## 2023-11-10 LAB — PHOSPHORUS: Phosphorus: 4 mg/dL (ref 2.5–4.6)

## 2023-11-10 LAB — MAGNESIUM: Magnesium: 1.9 mg/dL (ref 1.7–2.4)

## 2023-11-11 LAB — MICROALBUMIN / CREATININE URINE RATIO
Creatinine, Urine: 35.9 mg/dL
Microalb Creat Ratio: <8 mg/g{creat} (ref 0–29)
Microalb, Ur: <3 ug/mL — ABNORMAL HIGH

## 2023-11-12 LAB — TACROLIMUS LEVEL: Tacrolimus (FK506) - LabCorp: 8.2 ng/mL (ref 2.0–20.0)

## 2023-11-18 DIAGNOSIS — K219 Gastro-esophageal reflux disease without esophagitis: Secondary | ICD-10-CM | POA: Diagnosis not present

## 2023-11-18 DIAGNOSIS — N390 Urinary tract infection, site not specified: Secondary | ICD-10-CM | POA: Diagnosis not present

## 2023-11-18 DIAGNOSIS — N1831 Chronic kidney disease, stage 3a: Secondary | ICD-10-CM | POA: Diagnosis not present

## 2023-11-18 DIAGNOSIS — D849 Immunodeficiency, unspecified: Secondary | ICD-10-CM | POA: Diagnosis not present

## 2023-11-18 DIAGNOSIS — Z940 Kidney transplant status: Secondary | ICD-10-CM | POA: Diagnosis not present

## 2023-11-30 ENCOUNTER — Other Ambulatory Visit (HOSPITAL_COMMUNITY)
Admission: RE | Admit: 2023-11-30 | Discharge: 2023-11-30 | Disposition: A | Discharge status: Home / Self Care | Payer: 59 | Source: Ambulatory Visit | Attending: Nephrology | Admitting: Nephrology

## 2023-11-30 DIAGNOSIS — Z9483 Pancreas transplant status: Secondary | ICD-10-CM | POA: Diagnosis not present

## 2023-11-30 DIAGNOSIS — Z48288 Encounter for aftercare following multiple organ transplant: Secondary | ICD-10-CM | POA: Diagnosis not present

## 2023-11-30 DIAGNOSIS — N390 Urinary tract infection, site not specified: Secondary | ICD-10-CM | POA: Insufficient documentation

## 2023-11-30 DIAGNOSIS — D631 Anemia in chronic kidney disease: Secondary | ICD-10-CM | POA: Insufficient documentation

## 2023-11-30 DIAGNOSIS — B259 Cytomegaloviral disease, unspecified: Secondary | ICD-10-CM | POA: Diagnosis not present

## 2023-11-30 DIAGNOSIS — E1129 Type 2 diabetes mellitus with other diabetic kidney complication: Secondary | ICD-10-CM | POA: Diagnosis not present

## 2023-11-30 DIAGNOSIS — Z114 Encounter for screening for human immunodeficiency virus [HIV]: Secondary | ICD-10-CM | POA: Insufficient documentation

## 2023-11-30 DIAGNOSIS — Z940 Kidney transplant status: Secondary | ICD-10-CM | POA: Diagnosis not present

## 2023-11-30 DIAGNOSIS — Z789 Other specified health status: Secondary | ICD-10-CM | POA: Diagnosis not present

## 2023-11-30 DIAGNOSIS — T8610 Unspecified complication of kidney transplant: Secondary | ICD-10-CM | POA: Insufficient documentation

## 2023-11-30 DIAGNOSIS — Z79899 Other long term (current) drug therapy: Secondary | ICD-10-CM | POA: Insufficient documentation

## 2023-11-30 LAB — URINALYSIS, COMPLETE (UACMP) WITH MICROSCOPIC
Bacteria, UA: NONE SEEN
Bilirubin Urine: NEGATIVE
Glucose, UA: NEGATIVE mg/dL
Hgb urine dipstick: NEGATIVE
Ketones, ur: NEGATIVE mg/dL
Leukocytes,Ua: NEGATIVE
Nitrite: NEGATIVE
Protein, ur: NEGATIVE mg/dL
Specific Gravity, Urine: 1.011 (ref 1.005–1.030)
pH: 6 (ref 5.0–8.0)

## 2023-11-30 LAB — CBC WITH DIFFERENTIAL/PLATELET
Abs Immature Granulocytes: 0 10*3/uL (ref 0.00–0.07)
Basophils Absolute: 0 10*3/uL (ref 0.0–0.1)
Basophils Relative: 1 %
Eosinophils Absolute: 0.2 10*3/uL (ref 0.0–0.5)
Eosinophils Relative: 6 %
HCT: 38.9 % (ref 36.0–46.0)
Hemoglobin: 12 g/dL (ref 12.0–15.0)
Immature Granulocytes: 0 %
Lymphocytes Relative: 28 %
Lymphs Abs: 0.8 10*3/uL (ref 0.7–4.0)
MCH: 27.7 pg (ref 26.0–34.0)
MCHC: 30.8 g/dL (ref 30.0–36.0)
MCV: 89.8 fL (ref 80.0–100.0)
Monocytes Absolute: 0.6 10*3/uL (ref 0.1–1.0)
Monocytes Relative: 20 %
Neutro Abs: 1.3 10*3/uL — ABNORMAL LOW (ref 1.7–7.7)
Neutrophils Relative %: 45 %
Platelets: 220 10*3/uL (ref 150–400)
RBC: 4.33 MIL/uL (ref 3.87–5.11)
RDW: 14.9 % (ref 11.5–15.5)
WBC: 2.8 10*3/uL — ABNORMAL LOW (ref 4.0–10.5)
nRBC: 0 % (ref 0.0–0.2)

## 2023-11-30 LAB — MAGNESIUM: Magnesium: 1.7 mg/dL (ref 1.7–2.4)

## 2023-11-30 LAB — PHOSPHORUS: Phosphorus: 3.6 mg/dL (ref 2.5–4.6)

## 2023-11-30 LAB — BASIC METABOLIC PANEL
Anion gap: 12 (ref 5–15)
BUN: 23 mg/dL (ref 8–23)
CO2: 21 mmol/L — ABNORMAL LOW (ref 22–32)
Calcium: 9.2 mg/dL (ref 8.9–10.3)
Chloride: 106 mmol/L (ref 98–111)
Creatinine, Ser: 1.16 mg/dL — ABNORMAL HIGH (ref 0.44–1.00)
GFR, Estimated: 51 mL/min — ABNORMAL LOW (ref >60)
Glucose, Bld: 89 mg/dL (ref 70–99)
Potassium: 4 mmol/L (ref 3.5–5.1)
Sodium: 139 mmol/L (ref 135–145)

## 2023-11-30 LAB — PROTEIN / CREATININE RATIO, URINE
Creatinine, Urine: 71 mg/dL
Total Protein, Urine: <6 mg/dL

## 2023-12-01 LAB — HCV RNA DIAGNOSIS, NAA: HCV RNA, Quantitation: NOT DETECTED [IU]/mL

## 2023-12-01 LAB — URINE CULTURE

## 2023-12-02 LAB — TACROLIMUS LEVEL: Tacrolimus (FK506) - LabCorp: 12.7 ng/mL (ref 2.0–20.0)

## 2023-12-07 ENCOUNTER — Other Ambulatory Visit (HOSPITAL_COMMUNITY)
Admission: RE | Admit: 2023-12-07 | Discharge: 2023-12-07 | Disposition: A | Discharge status: Home / Self Care | Payer: 59 | Source: Ambulatory Visit | Attending: Nephrology | Admitting: Nephrology

## 2023-12-07 DIAGNOSIS — Z940 Kidney transplant status: Secondary | ICD-10-CM | POA: Diagnosis not present

## 2023-12-07 LAB — CBC WITH DIFFERENTIAL/PLATELET
Abs Immature Granulocytes: 0 10*3/uL (ref 0.00–0.07)
Basophils Absolute: 0 10*3/uL (ref 0.0–0.1)
Basophils Relative: 1 %
Eosinophils Absolute: 0.2 10*3/uL (ref 0.0–0.5)
Eosinophils Relative: 6 %
HCT: 37.6 % (ref 36.0–46.0)
Hemoglobin: 11.9 g/dL — ABNORMAL LOW (ref 12.0–15.0)
Immature Granulocytes: 0 %
Lymphocytes Relative: 32 %
Lymphs Abs: 0.9 10*3/uL (ref 0.7–4.0)
MCH: 28.4 pg (ref 26.0–34.0)
MCHC: 31.6 g/dL (ref 30.0–36.0)
MCV: 89.7 fL (ref 80.0–100.0)
Monocytes Absolute: 0.6 10*3/uL (ref 0.1–1.0)
Monocytes Relative: 23 %
Neutro Abs: 1 10*3/uL — ABNORMAL LOW (ref 1.7–7.7)
Neutrophils Relative %: 38 %
Platelets: 201 10*3/uL (ref 150–400)
RBC: 4.19 MIL/uL (ref 3.87–5.11)
RDW: 14.6 % (ref 11.5–15.5)
WBC: 2.7 10*3/uL — ABNORMAL LOW (ref 4.0–10.5)
nRBC: 0 % (ref 0.0–0.2)

## 2023-12-07 LAB — URINALYSIS, W/ REFLEX TO CULTURE (INFECTION SUSPECTED)
Bilirubin Urine: NEGATIVE
Glucose, UA: NEGATIVE mg/dL
Hgb urine dipstick: NEGATIVE
Ketones, ur: NEGATIVE mg/dL
Leukocytes,Ua: NEGATIVE
Nitrite: NEGATIVE
Protein, ur: NEGATIVE mg/dL
Specific Gravity, Urine: 1.012 (ref 1.005–1.030)
pH: 6 (ref 5.0–8.0)

## 2023-12-07 LAB — BASIC METABOLIC PANEL
Anion gap: 9 (ref 5–15)
BUN: 27 mg/dL — ABNORMAL HIGH (ref 8–23)
CO2: 21 mmol/L — ABNORMAL LOW (ref 22–32)
Calcium: 9.3 mg/dL (ref 8.9–10.3)
Chloride: 108 mmol/L (ref 98–111)
Creatinine, Ser: 1.16 mg/dL — ABNORMAL HIGH (ref 0.44–1.00)
GFR, Estimated: 51 mL/min — ABNORMAL LOW (ref >60)
Glucose, Bld: 104 mg/dL — ABNORMAL HIGH (ref 70–99)
Potassium: 3.8 mmol/L (ref 3.5–5.1)
Sodium: 138 mmol/L (ref 135–145)

## 2023-12-07 LAB — PHOSPHORUS: Phosphorus: 3.5 mg/dL (ref 2.5–4.6)

## 2023-12-07 LAB — PROTEIN / CREATININE RATIO, URINE
Creatinine, Urine: 82 mg/dL
Protein Creatinine Ratio: 0.07 mg/mg{creat} (ref 0.00–0.15)
Total Protein, Urine: 6 mg/dL

## 2023-12-07 LAB — MAGNESIUM: Magnesium: 1.8 mg/dL (ref 1.7–2.4)

## 2023-12-10 LAB — CMV DNA BY PCR, QUALITATIVE: CMV DNA, Qual PCR: NEGATIVE

## 2023-12-11 LAB — TACROLIMUS LEVEL: Tacrolimus (FK506) - LabCorp: 9.2 ng/mL (ref 5.0–20.0)

## 2023-12-21 ENCOUNTER — Other Ambulatory Visit (HOSPITAL_COMMUNITY)
Admission: RE | Admit: 2023-12-21 | Discharge: 2023-12-21 | Disposition: A | Discharge status: Home / Self Care | Payer: 59 | Source: Ambulatory Visit | Attending: Nephrology | Admitting: Nephrology

## 2023-12-21 DIAGNOSIS — N390 Urinary tract infection, site not specified: Secondary | ICD-10-CM | POA: Diagnosis not present

## 2023-12-21 DIAGNOSIS — E559 Vitamin D deficiency, unspecified: Secondary | ICD-10-CM | POA: Insufficient documentation

## 2023-12-21 DIAGNOSIS — D631 Anemia in chronic kidney disease: Secondary | ICD-10-CM | POA: Diagnosis not present

## 2023-12-21 DIAGNOSIS — Z79899 Other long term (current) drug therapy: Secondary | ICD-10-CM | POA: Insufficient documentation

## 2023-12-21 DIAGNOSIS — Z940 Kidney transplant status: Secondary | ICD-10-CM | POA: Insufficient documentation

## 2023-12-21 DIAGNOSIS — E1129 Type 2 diabetes mellitus with other diabetic kidney complication: Secondary | ICD-10-CM | POA: Insufficient documentation

## 2023-12-21 LAB — URINALYSIS, COMPLETE (UACMP) WITH MICROSCOPIC
Bacteria, UA: NONE SEEN
Bilirubin Urine: NEGATIVE
Glucose, UA: NEGATIVE mg/dL
Hgb urine dipstick: NEGATIVE
Ketones, ur: NEGATIVE mg/dL
Leukocytes,Ua: NEGATIVE
Nitrite: NEGATIVE
Protein, ur: NEGATIVE mg/dL
Specific Gravity, Urine: 1.01 (ref 1.005–1.030)
pH: 5 (ref 5.0–8.0)

## 2023-12-21 LAB — CBC WITH DIFFERENTIAL/PLATELET
Abs Immature Granulocytes: 0 10*3/uL (ref 0.00–0.07)
Basophils Absolute: 0 10*3/uL (ref 0.0–0.1)
Basophils Relative: 1 %
Eosinophils Absolute: 0.2 10*3/uL (ref 0.0–0.5)
Eosinophils Relative: 7 %
HCT: 38.5 % (ref 36.0–46.0)
Hemoglobin: 11.7 g/dL — ABNORMAL LOW (ref 12.0–15.0)
Immature Granulocytes: 0 %
Lymphocytes Relative: 30 %
Lymphs Abs: 0.8 10*3/uL (ref 0.7–4.0)
MCH: 27.3 pg (ref 26.0–34.0)
MCHC: 30.4 g/dL (ref 30.0–36.0)
MCV: 90 fL (ref 80.0–100.0)
Monocytes Absolute: 0.6 10*3/uL (ref 0.1–1.0)
Monocytes Relative: 23 %
Neutro Abs: 1.1 10*3/uL — ABNORMAL LOW (ref 1.7–7.7)
Neutrophils Relative %: 39 %
Platelets: 199 10*3/uL (ref 150–400)
RBC: 4.28 MIL/uL (ref 3.87–5.11)
RDW: 14.4 % (ref 11.5–15.5)
WBC: 2.7 10*3/uL — ABNORMAL LOW (ref 4.0–10.5)
nRBC: 0 % (ref 0.0–0.2)

## 2023-12-21 LAB — BASIC METABOLIC PANEL
Anion gap: 9 (ref 5–15)
BUN: 26 mg/dL — ABNORMAL HIGH (ref 8–23)
CO2: 21 mmol/L — ABNORMAL LOW (ref 22–32)
Calcium: 9.4 mg/dL (ref 8.9–10.3)
Chloride: 106 mmol/L (ref 98–111)
Creatinine, Ser: 1.1 mg/dL — ABNORMAL HIGH (ref 0.44–1.00)
GFR, Estimated: 54 mL/min — ABNORMAL LOW (ref >60)
Glucose, Bld: 87 mg/dL (ref 70–99)
Potassium: 3.9 mmol/L (ref 3.5–5.1)
Sodium: 136 mmol/L (ref 135–145)

## 2023-12-21 LAB — MAGNESIUM: Magnesium: 1.7 mg/dL (ref 1.7–2.4)

## 2023-12-21 LAB — PROTEIN / CREATININE RATIO, URINE
Creatinine, Urine: 54 mg/dL
Total Protein, Urine: <6 mg/dL

## 2023-12-21 LAB — PHOSPHORUS: Phosphorus: 3.5 mg/dL (ref 2.5–4.6)

## 2023-12-22 LAB — URINE CULTURE: Culture: <10000 — AB

## 2023-12-22 LAB — MISC LABCORP TEST (SEND OUT): Labcorp test code: 550080

## 2023-12-23 LAB — TACROLIMUS LEVEL: Tacrolimus (FK506) - LabCorp: 9 ng/mL (ref 5.0–20.0)

## 2023-12-23 LAB — MISC LABCORP TEST (SEND OUT): Labcorp test code: 139149

## 2023-12-28 DIAGNOSIS — N390 Urinary tract infection, site not specified: Secondary | ICD-10-CM | POA: Diagnosis not present

## 2023-12-28 DIAGNOSIS — Z940 Kidney transplant status: Secondary | ICD-10-CM | POA: Diagnosis not present

## 2023-12-28 DIAGNOSIS — M3214 Glomerular disease in systemic lupus erythematosus: Secondary | ICD-10-CM | POA: Diagnosis not present

## 2024-01-04 ENCOUNTER — Other Ambulatory Visit (HOSPITAL_COMMUNITY)
Admission: RE | Admit: 2024-01-04 | Discharge: 2024-01-04 | Disposition: A | Discharge status: Home / Self Care | Source: Ambulatory Visit | Attending: Nephrology | Admitting: Nephrology

## 2024-01-04 DIAGNOSIS — D631 Anemia in chronic kidney disease: Secondary | ICD-10-CM | POA: Insufficient documentation

## 2024-01-04 DIAGNOSIS — E1129 Type 2 diabetes mellitus with other diabetic kidney complication: Secondary | ICD-10-CM | POA: Insufficient documentation

## 2024-01-04 DIAGNOSIS — Z01812 Encounter for preprocedural laboratory examination: Secondary | ICD-10-CM | POA: Diagnosis not present

## 2024-01-04 DIAGNOSIS — T8610 Unspecified complication of kidney transplant: Secondary | ICD-10-CM | POA: Insufficient documentation

## 2024-01-04 DIAGNOSIS — D899 Disorder involving the immune mechanism, unspecified: Secondary | ICD-10-CM | POA: Insufficient documentation

## 2024-01-04 DIAGNOSIS — Z79899 Other long term (current) drug therapy: Secondary | ICD-10-CM | POA: Insufficient documentation

## 2024-01-04 DIAGNOSIS — E559 Vitamin D deficiency, unspecified: Secondary | ICD-10-CM | POA: Insufficient documentation

## 2024-01-04 DIAGNOSIS — Z940 Kidney transplant status: Secondary | ICD-10-CM | POA: Diagnosis not present

## 2024-01-04 DIAGNOSIS — Z9483 Pancreas transplant status: Secondary | ICD-10-CM | POA: Diagnosis not present

## 2024-01-04 DIAGNOSIS — Z789 Other specified health status: Secondary | ICD-10-CM | POA: Diagnosis not present

## 2024-01-04 DIAGNOSIS — B259 Cytomegaloviral disease, unspecified: Secondary | ICD-10-CM | POA: Insufficient documentation

## 2024-01-04 LAB — CBC WITH DIFFERENTIAL/PLATELET
Abs Immature Granulocytes: 0.01 10*3/uL (ref 0.00–0.07)
Basophils Absolute: 0 10*3/uL (ref 0.0–0.1)
Basophils Relative: 1 %
Eosinophils Absolute: 0.2 10*3/uL (ref 0.0–0.5)
Eosinophils Relative: 6 %
HCT: 38.8 % (ref 36.0–46.0)
Hemoglobin: 11.9 g/dL — ABNORMAL LOW (ref 12.0–15.0)
Immature Granulocytes: 0 %
Lymphocytes Relative: 27 %
Lymphs Abs: 0.8 10*3/uL (ref 0.7–4.0)
MCH: 27.5 pg (ref 26.0–34.0)
MCHC: 30.7 g/dL (ref 30.0–36.0)
MCV: 89.8 fL (ref 80.0–100.0)
Monocytes Absolute: 0.6 10*3/uL (ref 0.1–1.0)
Monocytes Relative: 22 %
Neutro Abs: 1.3 10*3/uL — ABNORMAL LOW (ref 1.7–7.7)
Neutrophils Relative %: 44 %
Platelets: 197 10*3/uL (ref 150–400)
RBC: 4.32 MIL/uL (ref 3.87–5.11)
RDW: 14.1 % (ref 11.5–15.5)
WBC: 2.9 10*3/uL — ABNORMAL LOW (ref 4.0–10.5)
nRBC: 0 % (ref 0.0–0.2)

## 2024-01-04 LAB — URINALYSIS, COMPLETE (UACMP) WITH MICROSCOPIC
Bacteria, UA: NONE SEEN
Bilirubin Urine: NEGATIVE
Glucose, UA: NEGATIVE mg/dL
Hgb urine dipstick: NEGATIVE
Ketones, ur: NEGATIVE mg/dL
Leukocytes,Ua: NEGATIVE
Nitrite: NEGATIVE
Protein, ur: NEGATIVE mg/dL
Specific Gravity, Urine: 1.005 (ref 1.005–1.030)
pH: 5 (ref 5.0–8.0)

## 2024-01-04 LAB — BASIC METABOLIC PANEL
Anion gap: 12 (ref 5–15)
BUN: 27 mg/dL — ABNORMAL HIGH (ref 8–23)
CO2: 21 mmol/L — ABNORMAL LOW (ref 22–32)
Calcium: 9.3 mg/dL (ref 8.9–10.3)
Chloride: 105 mmol/L (ref 98–111)
Creatinine, Ser: 1.26 mg/dL — ABNORMAL HIGH (ref 0.44–1.00)
GFR, Estimated: 46 mL/min — ABNORMAL LOW (ref >60)
Glucose, Bld: 83 mg/dL (ref 70–99)
Potassium: 3.8 mmol/L (ref 3.5–5.1)
Sodium: 138 mmol/L (ref 135–145)

## 2024-01-04 LAB — MAGNESIUM: Magnesium: 1.8 mg/dL (ref 1.7–2.4)

## 2024-01-04 LAB — PROTEIN / CREATININE RATIO, URINE
Creatinine, Urine: 27 mg/dL
Total Protein, Urine: <6 mg/dL

## 2024-01-04 LAB — PHOSPHORUS: Phosphorus: 3.5 mg/dL (ref 2.5–4.6)

## 2024-01-05 LAB — URINE CULTURE: Culture: <10000 — AB

## 2024-01-06 LAB — TACROLIMUS LEVEL: Tacrolimus (FK506) - LabCorp: 8.6 ng/mL (ref 5.0–20.0)

## 2024-01-07 LAB — CMV DNA BY PCR, QUALITATIVE: CMV DNA, Qual PCR: NEGATIVE

## 2024-01-18 ENCOUNTER — Other Ambulatory Visit (HOSPITAL_COMMUNITY)
Admission: RE | Admit: 2024-01-18 | Discharge: 2024-01-18 | Disposition: A | Discharge status: Home / Self Care | Source: Ambulatory Visit | Attending: Nephrology | Admitting: Nephrology

## 2024-01-18 DIAGNOSIS — E559 Vitamin D deficiency, unspecified: Secondary | ICD-10-CM | POA: Diagnosis not present

## 2024-01-18 DIAGNOSIS — Z114 Encounter for screening for human immunodeficiency virus [HIV]: Secondary | ICD-10-CM | POA: Insufficient documentation

## 2024-01-18 DIAGNOSIS — Z940 Kidney transplant status: Secondary | ICD-10-CM | POA: Insufficient documentation

## 2024-01-18 DIAGNOSIS — Z79899 Other long term (current) drug therapy: Secondary | ICD-10-CM | POA: Diagnosis not present

## 2024-01-18 DIAGNOSIS — D631 Anemia in chronic kidney disease: Secondary | ICD-10-CM | POA: Insufficient documentation

## 2024-01-18 DIAGNOSIS — Z789 Other specified health status: Secondary | ICD-10-CM | POA: Insufficient documentation

## 2024-01-18 DIAGNOSIS — Z9483 Pancreas transplant status: Secondary | ICD-10-CM | POA: Insufficient documentation

## 2024-01-18 DIAGNOSIS — D899 Disorder involving the immune mechanism, unspecified: Secondary | ICD-10-CM | POA: Insufficient documentation

## 2024-01-18 DIAGNOSIS — N390 Urinary tract infection, site not specified: Secondary | ICD-10-CM | POA: Diagnosis not present

## 2024-01-18 DIAGNOSIS — B259 Cytomegaloviral disease, unspecified: Secondary | ICD-10-CM | POA: Insufficient documentation

## 2024-01-18 DIAGNOSIS — E1129 Type 2 diabetes mellitus with other diabetic kidney complication: Secondary | ICD-10-CM | POA: Insufficient documentation

## 2024-01-18 DIAGNOSIS — Z09 Encounter for follow-up examination after completed treatment for conditions other than malignant neoplasm: Secondary | ICD-10-CM | POA: Insufficient documentation

## 2024-01-18 LAB — CBC WITH DIFFERENTIAL/PLATELET
Abs Immature Granulocytes: 0.01 10*3/uL (ref 0.00–0.07)
Basophils Absolute: 0 10*3/uL (ref 0.0–0.1)
Basophils Relative: 1 %
Eosinophils Absolute: 0.2 10*3/uL (ref 0.0–0.5)
Eosinophils Relative: 7 %
HCT: 39.2 % (ref 36.0–46.0)
Hemoglobin: 12 g/dL (ref 12.0–15.0)
Immature Granulocytes: 0 %
Lymphocytes Relative: 27 %
Lymphs Abs: 0.8 10*3/uL (ref 0.7–4.0)
MCH: 27.2 pg (ref 26.0–34.0)
MCHC: 30.6 g/dL (ref 30.0–36.0)
MCV: 88.9 fL (ref 80.0–100.0)
Monocytes Absolute: 0.7 10*3/uL (ref 0.1–1.0)
Monocytes Relative: 24 %
Neutro Abs: 1.2 10*3/uL — ABNORMAL LOW (ref 1.7–7.7)
Neutrophils Relative %: 41 %
Platelets: 194 10*3/uL (ref 150–400)
RBC: 4.41 MIL/uL (ref 3.87–5.11)
RDW: 14.2 % (ref 11.5–15.5)
WBC: 2.9 10*3/uL — ABNORMAL LOW (ref 4.0–10.5)
nRBC: 0 % (ref 0.0–0.2)

## 2024-01-18 LAB — URINALYSIS, COMPLETE (UACMP) WITH MICROSCOPIC
Bilirubin Urine: NEGATIVE
Glucose, UA: NEGATIVE mg/dL
Hgb urine dipstick: NEGATIVE
Ketones, ur: NEGATIVE mg/dL
Leukocytes,Ua: NEGATIVE
Nitrite: NEGATIVE
Protein, ur: NEGATIVE mg/dL
Specific Gravity, Urine: 1.004 — ABNORMAL LOW (ref 1.005–1.030)
pH: 5 (ref 5.0–8.0)

## 2024-01-18 LAB — PROTEIN / CREATININE RATIO, URINE
Creatinine, Urine: 22 mg/dL
Total Protein, Urine: <6 mg/dL

## 2024-01-18 LAB — BASIC METABOLIC PANEL
Anion gap: 11 (ref 5–15)
BUN: 29 mg/dL — ABNORMAL HIGH (ref 8–23)
CO2: 22 mmol/L (ref 22–32)
Calcium: 9.3 mg/dL (ref 8.9–10.3)
Chloride: 106 mmol/L (ref 98–111)
Creatinine, Ser: 1 mg/dL (ref 0.44–1.00)
GFR, Estimated: >60 mL/min (ref >60)
Glucose, Bld: 94 mg/dL (ref 70–99)
Potassium: 3.8 mmol/L (ref 3.5–5.1)
Sodium: 139 mmol/L (ref 135–145)

## 2024-01-18 LAB — MAGNESIUM: Magnesium: 1.9 mg/dL (ref 1.7–2.4)

## 2024-01-18 LAB — PHOSPHORUS: Phosphorus: 3.7 mg/dL (ref 2.5–4.6)

## 2024-01-19 LAB — URINE CULTURE

## 2024-01-19 LAB — TACROLIMUS LEVEL: Tacrolimus (FK506) - LabCorp: 6.7 ng/mL (ref 5.0–20.0)

## 2024-01-20 LAB — CMV DNA BY PCR, QUALITATIVE: CMV DNA, Qual PCR: NEGATIVE

## 2024-02-01 ENCOUNTER — Other Ambulatory Visit (HOSPITAL_COMMUNITY)
Admission: RE | Admit: 2024-02-01 | Discharge: 2024-02-01 | Disposition: A | Discharge status: Home / Self Care | Source: Ambulatory Visit | Attending: Nephrology | Admitting: Nephrology

## 2024-02-01 DIAGNOSIS — D631 Anemia in chronic kidney disease: Secondary | ICD-10-CM | POA: Insufficient documentation

## 2024-02-01 DIAGNOSIS — N189 Chronic kidney disease, unspecified: Secondary | ICD-10-CM | POA: Diagnosis not present

## 2024-02-01 DIAGNOSIS — N390 Urinary tract infection, site not specified: Secondary | ICD-10-CM | POA: Diagnosis not present

## 2024-02-01 DIAGNOSIS — E559 Vitamin D deficiency, unspecified: Secondary | ICD-10-CM | POA: Insufficient documentation

## 2024-02-01 DIAGNOSIS — Z940 Kidney transplant status: Secondary | ICD-10-CM | POA: Diagnosis not present

## 2024-02-01 DIAGNOSIS — Z79899 Other long term (current) drug therapy: Secondary | ICD-10-CM | POA: Diagnosis not present

## 2024-02-01 DIAGNOSIS — Z9483 Pancreas transplant status: Secondary | ICD-10-CM | POA: Insufficient documentation

## 2024-02-01 DIAGNOSIS — Z789 Other specified health status: Secondary | ICD-10-CM | POA: Diagnosis not present

## 2024-02-01 DIAGNOSIS — Z09 Encounter for follow-up examination after completed treatment for conditions other than malignant neoplasm: Secondary | ICD-10-CM | POA: Insufficient documentation

## 2024-02-01 DIAGNOSIS — Z114 Encounter for screening for human immunodeficiency virus [HIV]: Secondary | ICD-10-CM | POA: Diagnosis not present

## 2024-02-01 DIAGNOSIS — D899 Disorder involving the immune mechanism, unspecified: Secondary | ICD-10-CM | POA: Diagnosis not present

## 2024-02-01 DIAGNOSIS — B259 Cytomegaloviral disease, unspecified: Secondary | ICD-10-CM | POA: Insufficient documentation

## 2024-02-01 DIAGNOSIS — E1129 Type 2 diabetes mellitus with other diabetic kidney complication: Secondary | ICD-10-CM | POA: Diagnosis not present

## 2024-02-01 DIAGNOSIS — M3214 Glomerular disease in systemic lupus erythematosus: Secondary | ICD-10-CM | POA: Diagnosis not present

## 2024-02-01 DIAGNOSIS — I151 Hypertension secondary to other renal disorders: Secondary | ICD-10-CM | POA: Diagnosis not present

## 2024-02-01 LAB — BASIC METABOLIC PANEL WITH GFR
Anion gap: 9 (ref 5–15)
BUN: 21 mg/dL (ref 8–23)
CO2: 24 mmol/L (ref 22–32)
Calcium: 9.2 mg/dL (ref 8.9–10.3)
Chloride: 106 mmol/L (ref 98–111)
Creatinine, Ser: 1.02 mg/dL — ABNORMAL HIGH (ref 0.44–1.00)
GFR, Estimated: 59 mL/min — ABNORMAL LOW (ref >60)
Glucose, Bld: 85 mg/dL (ref 70–99)
Potassium: 3.7 mmol/L (ref 3.5–5.1)
Sodium: 139 mmol/L (ref 135–145)

## 2024-02-01 LAB — CBC WITH DIFFERENTIAL/PLATELET
Abs Immature Granulocytes: 0.02 10*3/uL (ref 0.00–0.07)
Basophils Absolute: 0 10*3/uL (ref 0.0–0.1)
Basophils Relative: 1 %
Eosinophils Absolute: 0.2 10*3/uL (ref 0.0–0.5)
Eosinophils Relative: 6 %
HCT: 37.8 % (ref 36.0–46.0)
Hemoglobin: 11.4 g/dL — ABNORMAL LOW (ref 12.0–15.0)
Immature Granulocytes: 1 %
Lymphocytes Relative: 24 %
Lymphs Abs: 0.7 10*3/uL (ref 0.7–4.0)
MCH: 26.6 pg (ref 26.0–34.0)
MCHC: 30.2 g/dL (ref 30.0–36.0)
MCV: 88.3 fL (ref 80.0–100.0)
Monocytes Absolute: 0.8 10*3/uL (ref 0.1–1.0)
Monocytes Relative: 25 %
Neutro Abs: 1.3 10*3/uL — ABNORMAL LOW (ref 1.7–7.7)
Neutrophils Relative %: 43 %
Platelets: 208 10*3/uL (ref 150–400)
RBC: 4.28 MIL/uL (ref 3.87–5.11)
RDW: 14.3 % (ref 11.5–15.5)
WBC: 3 10*3/uL — ABNORMAL LOW (ref 4.0–10.5)
nRBC: 0 % (ref 0.0–0.2)

## 2024-02-01 LAB — URINALYSIS, ROUTINE W REFLEX MICROSCOPIC
Bilirubin Urine: NEGATIVE
Glucose, UA: NEGATIVE mg/dL
Hgb urine dipstick: NEGATIVE
Ketones, ur: NEGATIVE mg/dL
Leukocytes,Ua: NEGATIVE
Nitrite: NEGATIVE
Protein, ur: NEGATIVE mg/dL
Specific Gravity, Urine: 1.013 (ref 1.005–1.030)
pH: 6 (ref 5.0–8.0)

## 2024-02-01 LAB — PROTEIN / CREATININE RATIO, URINE
Creatinine, Urine: 80 mg/dL
Total Protein, Urine: <6 mg/dL

## 2024-02-01 LAB — PHOSPHORUS: Phosphorus: 3 mg/dL (ref 2.5–4.6)

## 2024-02-01 LAB — MAGNESIUM: Magnesium: 1.8 mg/dL (ref 1.7–2.4)

## 2024-02-02 LAB — URINE CULTURE

## 2024-02-02 LAB — TACROLIMUS LEVEL: Tacrolimus (FK506) - LabCorp: 5.7 ng/mL (ref 5.0–20.0)

## 2024-02-04 LAB — CMV DNA BY PCR, QUALITATIVE: CMV DNA, Qual PCR: NEGATIVE

## 2024-02-04 LAB — MISC LABCORP TEST (SEND OUT): LabCorp test name: 138693

## 2024-02-11 ENCOUNTER — Ambulatory Visit (INDEPENDENT_AMBULATORY_CARE_PROVIDER_SITE_OTHER): Admitting: Podiatry

## 2024-02-11 ENCOUNTER — Encounter: Payer: Self-pay | Admitting: Podiatry

## 2024-02-11 ENCOUNTER — Ambulatory Visit (INDEPENDENT_AMBULATORY_CARE_PROVIDER_SITE_OTHER)

## 2024-02-11 DIAGNOSIS — M21611 Bunion of right foot: Secondary | ICD-10-CM | POA: Diagnosis not present

## 2024-02-11 DIAGNOSIS — M2041 Other hammer toe(s) (acquired), right foot: Secondary | ICD-10-CM

## 2024-02-11 DIAGNOSIS — M7741 Metatarsalgia, right foot: Secondary | ICD-10-CM

## 2024-02-11 DIAGNOSIS — M2011 Hallux valgus (acquired), right foot: Secondary | ICD-10-CM

## 2024-02-11 DIAGNOSIS — M21619 Bunion of unspecified foot: Secondary | ICD-10-CM

## 2024-02-14 NOTE — Progress Notes (Signed)
 Subjective:  Patient ID: Sheena Smith, female    DOB: Sep 25, 1951,  MRN: [id number]  Chief Complaint  Patient presents with   Bunions    painful bunion R foot, patient had kidney transplant     Discussed the use of AI scribe software for clinical note transcription with the patient, who gave verbal consent to proceed.  History of Present Illness Sheena Smith is a 73 year old female with lupus and a kidney transplant who presents with right foot pain due to a bunion.  She experiences pain in her right foot due to a bunion, which became problematic after she resumed wearing tennis shoes to join a gym following her kidney transplant in August. The pain is particularly noticeable when wearing her [place], which she believes have a wide toe box but is unsure of the exact width. The pain causes difficulty walking.  She has a history of surgery on her right foot approximately fifteen years ago, but the bunion has since grown back. Additionally, she notes the presence of a painful corn pressing against her foot. She has not experienced foot pain related to her lupus in the past twenty years.  Her family history includes bunions and hammertoes, as her mother and grandmother also had these conditions. She acknowledges that her foot issues may be hereditary.  She currently takes tacrolimus and CellCept for her kidney transplant, and hydroxychloroquine every other day for lupus. She does not have diabetes, does not smoke, and does not drink alcohol. Her blood pressure, previously high, is now normal post-transplant. No history of osteoporosis, broken bones, or hip fractures. Recent bone density scan was performed at [facility].      Objective:    Physical Exam VASCULAR: DP and PT pulse palpable. Foot is warm and well-perfused. Capillary fill time is brisk. DERMATOLOGIC: Normal skin turgor texture and temperature. No open lesions or rashes or ulcerations. NEUROLOGIC: Normal sensation to light touch and  pressure. No paresthesias on examination. ORTHOPEDIC: Right foot deformity with digital contractures, nonreducible MTP, and arthritic changes. Prominent plantar metatarsal heads on right foot. Right great toe non-reducible, indicating severe contracture. No ecchymosis or bruising. No pain to palpation.   No images are attached to the encounter.    Results RADIOLOGY Right foot radiographs: Severe hallux valgus deformity, arthritic changes, complete frontal plane rotation of the hallux, digital contractures, prominent elongated metatarsal heads (02/11/2024)   Assessment:   1. Bunion      Plan:  Patient was evaluated and treated and all questions answered.  Assessment and Plan Assessment & Plan Severe hallux valgus deformity with hammer toes and metatarsalgia Severe hallux valgus deformity, hammer toes, and metatarsalgia on the right foot, secondary to lupus and hereditary factors. Exacerbated by wearing tennis shoes, causing pain due to the bunion and associated corn. Deformity includes complete frontal plane rotation of the hallux, digital contractures, and prominent elongated metatarsal heads. Previous foot surgery 15 years ago, but deformity has recurred. Lupus contributes to joint damage and inflammation, worsening the condition. Surgical intervention considered, but recent kidney transplant can increase risk of infection and healing complications due to immunosuppressive medications. Surgical options: MTP fusion, panmetatarsal head resection, and hammer toe correction with wire fixation. Recovery: 2-3 weeks non-weight bearing, followed by gradual weight-bearing in a boot for 4-6 weeks, total recovery 8-10 weeks. Risks: infection and delayed healing, especially due to immunosuppressive therapy. Benefits: significant improvement in foot structure and function, with many patients experiencing life-changing results.  Discussed all risk benefits and potential complications  including but not  limited to  pain, swelling, infection, scar, numbness which may be temporary or permanent, chronic pain, stiffness, nerve pain or damage, wound healing problems, bone healing problems including delayed or non-union.  All questions addressed.  Informed consent reviewed.  We will need input from her PCP and transplant team regarding her medication management      Surgical plan:  Procedure: - Right first MTP fusion, pan met head resection, hammertoe correction 2 through 5  Location: - [facility]  Anesthesia plan: - General With regional block  Postoperative pain plan: - Tylenol 1000 mg every 6 hours, gabapentin 300 mg every 8 hours x5 days, oxycodone 5 mg 1-2 tabs every 6 hours only as needed  DVT prophylaxis: - Xarelto 10 mg nightly  WB Restrictions / DME needs: - Nonweightbearing in splint postop    No follow-ups on file.

## 2024-02-16 ENCOUNTER — Other Ambulatory Visit: Payer: Self-pay | Admitting: Family Medicine

## 2024-02-16 ENCOUNTER — Ambulatory Visit
Admission: RE | Admit: 2024-02-16 | Discharge: 2024-02-16 | Disposition: A | Discharge status: Home / Self Care | Payer: 59 | Source: Ambulatory Visit | Attending: Family Medicine | Admitting: Family Medicine

## 2024-02-16 DIAGNOSIS — Z1231 Encounter for screening mammogram for malignant neoplasm of breast: Secondary | ICD-10-CM

## 2024-02-16 DIAGNOSIS — Z780 Asymptomatic menopausal state: Secondary | ICD-10-CM | POA: Diagnosis not present

## 2024-02-16 DIAGNOSIS — E2839 Other primary ovarian failure: Secondary | ICD-10-CM

## 2024-02-19 DIAGNOSIS — N185 Chronic kidney disease, stage 5: Secondary | ICD-10-CM | POA: Diagnosis not present

## 2024-02-19 DIAGNOSIS — D631 Anemia in chronic kidney disease: Secondary | ICD-10-CM | POA: Diagnosis not present

## 2024-02-19 DIAGNOSIS — N390 Urinary tract infection, site not specified: Secondary | ICD-10-CM | POA: Diagnosis not present

## 2024-02-19 DIAGNOSIS — D849 Immunodeficiency, unspecified: Secondary | ICD-10-CM | POA: Diagnosis not present

## 2024-02-19 DIAGNOSIS — N1831 Chronic kidney disease, stage 3a: Secondary | ICD-10-CM | POA: Diagnosis not present

## 2024-02-19 DIAGNOSIS — E119 Type 2 diabetes mellitus without complications: Secondary | ICD-10-CM | POA: Diagnosis not present

## 2024-02-19 DIAGNOSIS — Z940 Kidney transplant status: Secondary | ICD-10-CM | POA: Diagnosis not present

## 2024-02-19 DIAGNOSIS — I10 Essential (primary) hypertension: Secondary | ICD-10-CM | POA: Diagnosis not present

## 2024-02-22 ENCOUNTER — Other Ambulatory Visit (HOSPITAL_COMMUNITY)
Admission: RE | Admit: 2024-02-22 | Discharge: 2024-02-22 | Disposition: A | Discharge status: Home / Self Care | Source: Ambulatory Visit | Attending: Nephrology | Admitting: Nephrology

## 2024-02-22 DIAGNOSIS — E559 Vitamin D deficiency, unspecified: Secondary | ICD-10-CM | POA: Insufficient documentation

## 2024-02-22 DIAGNOSIS — Z79899 Other long term (current) drug therapy: Secondary | ICD-10-CM | POA: Diagnosis not present

## 2024-02-22 DIAGNOSIS — Z09 Encounter for follow-up examination after completed treatment for conditions other than malignant neoplasm: Secondary | ICD-10-CM | POA: Diagnosis not present

## 2024-02-22 DIAGNOSIS — E1129 Type 2 diabetes mellitus with other diabetic kidney complication: Secondary | ICD-10-CM | POA: Diagnosis not present

## 2024-02-22 DIAGNOSIS — Z114 Encounter for screening for human immunodeficiency virus [HIV]: Secondary | ICD-10-CM | POA: Insufficient documentation

## 2024-02-22 DIAGNOSIS — N390 Urinary tract infection, site not specified: Secondary | ICD-10-CM | POA: Insufficient documentation

## 2024-02-22 DIAGNOSIS — B259 Cytomegaloviral disease, unspecified: Secondary | ICD-10-CM | POA: Diagnosis not present

## 2024-02-22 DIAGNOSIS — D631 Anemia in chronic kidney disease: Secondary | ICD-10-CM | POA: Insufficient documentation

## 2024-02-22 DIAGNOSIS — D899 Disorder involving the immune mechanism, unspecified: Secondary | ICD-10-CM | POA: Diagnosis not present

## 2024-02-22 DIAGNOSIS — Z9483 Pancreas transplant status: Secondary | ICD-10-CM | POA: Insufficient documentation

## 2024-02-22 DIAGNOSIS — T8610 Unspecified complication of kidney transplant: Secondary | ICD-10-CM | POA: Diagnosis not present

## 2024-02-22 DIAGNOSIS — Z940 Kidney transplant status: Secondary | ICD-10-CM | POA: Insufficient documentation

## 2024-02-22 DIAGNOSIS — Z789 Other specified health status: Secondary | ICD-10-CM | POA: Insufficient documentation

## 2024-02-22 LAB — CBC WITH DIFFERENTIAL/PLATELET
Abs Immature Granulocytes: 0.01 10*3/uL (ref 0.00–0.07)
Basophils Absolute: 0 10*3/uL (ref 0.0–0.1)
Basophils Relative: 1 %
Eosinophils Absolute: 0.2 10*3/uL (ref 0.0–0.5)
Eosinophils Relative: 5 %
HCT: 39.1 % (ref 36.0–46.0)
Hemoglobin: 11.5 g/dL — ABNORMAL LOW (ref 12.0–15.0)
Immature Granulocytes: 0 %
Lymphocytes Relative: 20 %
Lymphs Abs: 0.7 10*3/uL (ref 0.7–4.0)
MCH: 26.6 pg (ref 26.0–34.0)
MCHC: 29.4 g/dL — ABNORMAL LOW (ref 30.0–36.0)
MCV: 90.5 fL (ref 80.0–100.0)
Monocytes Absolute: 0.7 10*3/uL (ref 0.1–1.0)
Monocytes Relative: 20 %
Neutro Abs: 2 10*3/uL (ref 1.7–7.7)
Neutrophils Relative %: 54 %
Platelets: 191 10*3/uL (ref 150–400)
RBC: 4.32 MIL/uL (ref 3.87–5.11)
RDW: 14.6 % (ref 11.5–15.5)
WBC: 3.6 10*3/uL — ABNORMAL LOW (ref 4.0–10.5)
nRBC: 0 % (ref 0.0–0.2)

## 2024-02-22 LAB — BASIC METABOLIC PANEL WITH GFR
Anion gap: 9 (ref 5–15)
BUN: 25 mg/dL — ABNORMAL HIGH (ref 8–23)
CO2: 24 mmol/L (ref 22–32)
Calcium: 9.1 mg/dL (ref 8.9–10.3)
Chloride: 105 mmol/L (ref 98–111)
Creatinine, Ser: 0.99 mg/dL (ref 0.44–1.00)
GFR, Estimated: >60 mL/min (ref >60)
Glucose, Bld: 70 mg/dL (ref 70–99)
Potassium: 3.9 mmol/L (ref 3.5–5.1)
Sodium: 138 mmol/L (ref 135–145)

## 2024-02-22 LAB — URINALYSIS, ROUTINE W REFLEX MICROSCOPIC
Bilirubin Urine: NEGATIVE
Glucose, UA: NEGATIVE mg/dL
Hgb urine dipstick: NEGATIVE
Ketones, ur: NEGATIVE mg/dL
Leukocytes,Ua: NEGATIVE
Nitrite: NEGATIVE
Protein, ur: NEGATIVE mg/dL
Specific Gravity, Urine: 1.012 (ref 1.005–1.030)
pH: 6 (ref 5.0–8.0)

## 2024-02-22 LAB — PROTEIN / CREATININE RATIO, URINE
Creatinine, Urine: 65 mg/dL
Total Protein, Urine: <6 mg/dL

## 2024-02-22 LAB — MAGNESIUM: Magnesium: 1.7 mg/dL (ref 1.7–2.4)

## 2024-02-22 LAB — PHOSPHORUS: Phosphorus: 3.6 mg/dL (ref 2.5–4.6)

## 2024-02-23 LAB — URINE CULTURE

## 2024-02-24 LAB — TACROLIMUS LEVEL: Tacrolimus (FK506) - LabCorp: 6.5 ng/mL (ref 5.0–20.0)

## 2024-02-25 LAB — CMV DNA BY PCR, QUALITATIVE: CMV DNA, Qual PCR: NEGATIVE

## 2024-02-26 ENCOUNTER — Ambulatory Visit
Admission: RE | Admit: 2024-02-26 | Discharge: 2024-02-26 | Disposition: A | Discharge status: Home / Self Care | Source: Ambulatory Visit | Attending: Family Medicine | Admitting: Family Medicine

## 2024-02-26 DIAGNOSIS — Z1231 Encounter for screening mammogram for malignant neoplasm of breast: Secondary | ICD-10-CM

## 2024-03-07 ENCOUNTER — Other Ambulatory Visit (HOSPITAL_COMMUNITY)
Admission: RE | Admit: 2024-03-07 | Discharge: 2024-03-07 | Disposition: A | Discharge status: Home / Self Care | Source: Ambulatory Visit | Attending: Nephrology | Admitting: Nephrology

## 2024-03-07 DIAGNOSIS — D631 Anemia in chronic kidney disease: Secondary | ICD-10-CM | POA: Diagnosis not present

## 2024-03-07 DIAGNOSIS — Z940 Kidney transplant status: Secondary | ICD-10-CM | POA: Insufficient documentation

## 2024-03-07 DIAGNOSIS — B259 Cytomegaloviral disease, unspecified: Secondary | ICD-10-CM | POA: Insufficient documentation

## 2024-03-07 DIAGNOSIS — E559 Vitamin D deficiency, unspecified: Secondary | ICD-10-CM | POA: Insufficient documentation

## 2024-03-07 DIAGNOSIS — Z79899 Other long term (current) drug therapy: Secondary | ICD-10-CM | POA: Insufficient documentation

## 2024-03-07 DIAGNOSIS — T8610 Unspecified complication of kidney transplant: Secondary | ICD-10-CM | POA: Diagnosis not present

## 2024-03-07 DIAGNOSIS — N390 Urinary tract infection, site not specified: Secondary | ICD-10-CM | POA: Diagnosis not present

## 2024-03-07 DIAGNOSIS — Z789 Other specified health status: Secondary | ICD-10-CM | POA: Diagnosis not present

## 2024-03-07 DIAGNOSIS — D899 Disorder involving the immune mechanism, unspecified: Secondary | ICD-10-CM | POA: Diagnosis not present

## 2024-03-07 DIAGNOSIS — E1129 Type 2 diabetes mellitus with other diabetic kidney complication: Secondary | ICD-10-CM | POA: Diagnosis not present

## 2024-03-07 DIAGNOSIS — Z09 Encounter for follow-up examination after completed treatment for conditions other than malignant neoplasm: Secondary | ICD-10-CM | POA: Diagnosis present

## 2024-03-07 DIAGNOSIS — Z9483 Pancreas transplant status: Secondary | ICD-10-CM | POA: Diagnosis not present

## 2024-03-07 DIAGNOSIS — Z114 Encounter for screening for human immunodeficiency virus [HIV]: Secondary | ICD-10-CM | POA: Insufficient documentation

## 2024-03-07 LAB — URINALYSIS, COMPLETE (UACMP) WITH MICROSCOPIC
Bacteria, UA: NONE SEEN
Bilirubin Urine: NEGATIVE
Glucose, UA: NEGATIVE mg/dL
Hgb urine dipstick: NEGATIVE
Ketones, ur: NEGATIVE mg/dL
Leukocytes,Ua: NEGATIVE
Nitrite: NEGATIVE
Protein, ur: NEGATIVE mg/dL
Specific Gravity, Urine: 1.005 (ref 1.005–1.030)
pH: 6 (ref 5.0–8.0)

## 2024-03-07 LAB — CBC WITH DIFFERENTIAL/PLATELET
Abs Immature Granulocytes: 0 10*3/uL (ref 0.00–0.07)
Basophils Absolute: 0 10*3/uL (ref 0.0–0.1)
Basophils Relative: 1 %
Eosinophils Absolute: 0.2 10*3/uL (ref 0.0–0.5)
Eosinophils Relative: 8 %
HCT: 38.8 % (ref 36.0–46.0)
Hemoglobin: 11.6 g/dL — ABNORMAL LOW (ref 12.0–15.0)
Immature Granulocytes: 0 %
Lymphocytes Relative: 29 %
Lymphs Abs: 0.8 10*3/uL (ref 0.7–4.0)
MCH: 26.5 pg (ref 26.0–34.0)
MCHC: 29.9 g/dL — ABNORMAL LOW (ref 30.0–36.0)
MCV: 88.8 fL (ref 80.0–100.0)
Monocytes Absolute: 0.7 10*3/uL (ref 0.1–1.0)
Monocytes Relative: 28 %
Neutro Abs: 0.9 10*3/uL — ABNORMAL LOW (ref 1.7–7.7)
Neutrophils Relative %: 34 %
Platelets: 198 10*3/uL (ref 150–400)
RBC: 4.37 MIL/uL (ref 3.87–5.11)
RDW: 14.4 % (ref 11.5–15.5)
WBC: 2.7 10*3/uL — ABNORMAL LOW (ref 4.0–10.5)
nRBC: 0 % (ref 0.0–0.2)

## 2024-03-07 LAB — BASIC METABOLIC PANEL WITH GFR
Anion gap: 11 (ref 5–15)
BUN: 21 mg/dL (ref 8–23)
CO2: 22 mmol/L (ref 22–32)
Calcium: 9 mg/dL (ref 8.9–10.3)
Chloride: 102 mmol/L (ref 98–111)
Creatinine, Ser: 1.05 mg/dL — ABNORMAL HIGH (ref 0.44–1.00)
GFR, Estimated: 57 mL/min — ABNORMAL LOW (ref >60)
Glucose, Bld: 84 mg/dL (ref 70–99)
Potassium: 3.9 mmol/L (ref 3.5–5.1)
Sodium: 135 mmol/L (ref 135–145)

## 2024-03-07 LAB — MAGNESIUM: Magnesium: 1.8 mg/dL (ref 1.7–2.4)

## 2024-03-07 LAB — PHOSPHORUS: Phosphorus: 3.8 mg/dL (ref 2.5–4.6)

## 2024-03-08 LAB — CMV DNA, QUANTITATIVE, PCR
CMV DNA Quant: NEGATIVE [IU]/mL
Log10 CMV Qn DNA Pl: UNDETERMINED {Log_IU}/mL

## 2024-03-08 LAB — URINE CULTURE

## 2024-03-09 ENCOUNTER — Telehealth: Payer: Self-pay | Admitting: Podiatry

## 2024-03-09 LAB — TACROLIMUS LEVEL: Tacrolimus (FK506) - LabCorp: 7.6 ng/mL (ref 5.0–20.0)

## 2024-03-09 NOTE — Telephone Encounter (Signed)
 Left message for pt to call to schedule her surgery with Dr Michalene Agee.

## 2024-03-10 ENCOUNTER — Telehealth: Payer: Self-pay | Admitting: Podiatry

## 2024-03-10 NOTE — Telephone Encounter (Signed)
 Pt returned call from yesterday and after discussing the surgery with you and her family has decided she is not wanting to proceed with the surgery.   I told pt that I will hold on to the consent and it is good for 6 months of signature and if she changes her mind to give me a call back.

## 2024-03-17 DIAGNOSIS — Z79899 Other long term (current) drug therapy: Secondary | ICD-10-CM | POA: Diagnosis not present

## 2024-03-17 DIAGNOSIS — M3210 Systemic lupus erythematosus, organ or system involvement unspecified: Secondary | ICD-10-CM | POA: Diagnosis not present

## 2024-03-17 DIAGNOSIS — H5203 Hypermetropia, bilateral: Secondary | ICD-10-CM | POA: Diagnosis not present

## 2024-03-21 ENCOUNTER — Other Ambulatory Visit (HOSPITAL_COMMUNITY)
Admission: RE | Admit: 2024-03-21 | Discharge: 2024-03-21 | Disposition: A | Discharge status: Home / Self Care | Source: Ambulatory Visit | Attending: Nephrology | Admitting: Nephrology

## 2024-03-21 DIAGNOSIS — Z940 Kidney transplant status: Secondary | ICD-10-CM | POA: Diagnosis not present

## 2024-03-21 LAB — BASIC METABOLIC PANEL WITH GFR
Anion gap: 10 (ref 5–15)
BUN: 15 mg/dL (ref 8–23)
CO2: 23 mmol/L (ref 22–32)
Calcium: 9 mg/dL (ref 8.9–10.3)
Chloride: 103 mmol/L (ref 98–111)
Creatinine, Ser: 0.95 mg/dL (ref 0.44–1.00)
GFR, Estimated: >60 mL/min (ref >60)
Glucose, Bld: 79 mg/dL (ref 70–99)
Potassium: 3.8 mmol/L (ref 3.5–5.1)
Sodium: 136 mmol/L (ref 135–145)

## 2024-03-21 LAB — CBC WITH DIFFERENTIAL/PLATELET
Abs Immature Granulocytes: 0.01 10*3/uL (ref 0.00–0.07)
Basophils Absolute: 0 10*3/uL (ref 0.0–0.1)
Basophils Relative: 1 %
Eosinophils Absolute: 0.2 10*3/uL (ref 0.0–0.5)
Eosinophils Relative: 6 %
HCT: 41.4 % (ref 36.0–46.0)
Hemoglobin: 12.2 g/dL (ref 12.0–15.0)
Immature Granulocytes: 0 %
Lymphocytes Relative: 24 %
Lymphs Abs: 0.8 10*3/uL (ref 0.7–4.0)
MCH: 26.5 pg (ref 26.0–34.0)
MCHC: 29.5 g/dL — ABNORMAL LOW (ref 30.0–36.0)
MCV: 89.8 fL (ref 80.0–100.0)
Monocytes Absolute: 0.6 10*3/uL (ref 0.1–1.0)
Monocytes Relative: 17 %
Neutro Abs: 1.8 10*3/uL (ref 1.7–7.7)
Neutrophils Relative %: 52 %
Platelets: 205 10*3/uL (ref 150–400)
RBC: 4.61 MIL/uL (ref 3.87–5.11)
RDW: 14.8 % (ref 11.5–15.5)
WBC: 3.5 10*3/uL — ABNORMAL LOW (ref 4.0–10.5)
nRBC: 0 % (ref 0.0–0.2)

## 2024-03-21 LAB — URINALYSIS, W/ REFLEX TO CULTURE (INFECTION SUSPECTED)
Bacteria, UA: NONE SEEN
Bilirubin Urine: NEGATIVE
Glucose, UA: NEGATIVE mg/dL
Hgb urine dipstick: NEGATIVE
Ketones, ur: NEGATIVE mg/dL
Leukocytes,Ua: NEGATIVE
Nitrite: NEGATIVE
Protein, ur: NEGATIVE mg/dL
Specific Gravity, Urine: 1.003 — ABNORMAL LOW (ref 1.005–1.030)
pH: 6 (ref 5.0–8.0)

## 2024-03-21 LAB — MAGNESIUM: Magnesium: 1.9 mg/dL (ref 1.7–2.4)

## 2024-03-21 LAB — PHOSPHORUS: Phosphorus: 3.5 mg/dL (ref 2.5–4.6)

## 2024-03-21 LAB — PROTEIN / CREATININE RATIO, URINE
Creatinine, Urine: 25 mg/dL
Total Protein, Urine: <6 mg/dL

## 2024-03-22 ENCOUNTER — Encounter (INDEPENDENT_AMBULATORY_CARE_PROVIDER_SITE_OTHER): Payer: Self-pay

## 2024-03-22 LAB — MISC LABCORP TEST (SEND OUT): Labcorp test code: 139149

## 2024-03-22 LAB — TACROLIMUS LEVEL: Tacrolimus (FK506) - LabCorp: 5.2 ng/mL (ref 5.0–20.0)

## 2024-04-04 ENCOUNTER — Other Ambulatory Visit (HOSPITAL_COMMUNITY)
Admission: RE | Admit: 2024-04-04 | Discharge: 2024-04-04 | Disposition: A | Discharge status: Home / Self Care | Source: Ambulatory Visit | Attending: Nephrology | Admitting: Nephrology

## 2024-04-04 DIAGNOSIS — Z940 Kidney transplant status: Secondary | ICD-10-CM | POA: Insufficient documentation

## 2024-04-04 LAB — CBC WITH DIFFERENTIAL/PLATELET
Abs Immature Granulocytes: 0.01 10*3/uL (ref 0.00–0.07)
Basophils Absolute: 0 10*3/uL (ref 0.0–0.1)
Basophils Relative: 1 %
Eosinophils Absolute: 0.2 10*3/uL (ref 0.0–0.5)
Eosinophils Relative: 4 %
HCT: 42.4 % (ref 36.0–46.0)
Hemoglobin: 12.5 g/dL (ref 12.0–15.0)
Immature Granulocytes: 0 %
Lymphocytes Relative: 19 %
Lymphs Abs: 0.9 10*3/uL (ref 0.7–4.0)
MCH: 26.5 pg (ref 26.0–34.0)
MCHC: 29.5 g/dL — ABNORMAL LOW (ref 30.0–36.0)
MCV: 90 fL (ref 80.0–100.0)
Monocytes Absolute: 0.5 10*3/uL (ref 0.1–1.0)
Monocytes Relative: 10 %
Neutro Abs: 3.2 10*3/uL (ref 1.7–7.7)
Neutrophils Relative %: 66 %
Platelets: 216 10*3/uL (ref 150–400)
RBC: 4.71 MIL/uL (ref 3.87–5.11)
RDW: 14.7 % (ref 11.5–15.5)
WBC: 4.8 10*3/uL (ref 4.0–10.5)
nRBC: 0 % (ref 0.0–0.2)

## 2024-04-04 LAB — BASIC METABOLIC PANEL WITH GFR
Anion gap: 9 (ref 5–15)
BUN: 20 mg/dL (ref 8–23)
CO2: 24 mmol/L (ref 22–32)
Calcium: 9.1 mg/dL (ref 8.9–10.3)
Chloride: 103 mmol/L (ref 98–111)
Creatinine, Ser: 1.17 mg/dL — ABNORMAL HIGH (ref 0.44–1.00)
GFR, Estimated: 50 mL/min — ABNORMAL LOW (ref >60)
Glucose, Bld: 82 mg/dL (ref 70–99)
Potassium: 3.6 mmol/L (ref 3.5–5.1)
Sodium: 136 mmol/L (ref 135–145)

## 2024-04-04 LAB — URINALYSIS, W/ REFLEX TO CULTURE (INFECTION SUSPECTED)
Bacteria, UA: NONE SEEN
Bilirubin Urine: NEGATIVE
Glucose, UA: NEGATIVE mg/dL
Hgb urine dipstick: NEGATIVE
Ketones, ur: NEGATIVE mg/dL
Leukocytes,Ua: NEGATIVE
Nitrite: NEGATIVE
Protein, ur: NEGATIVE mg/dL
Specific Gravity, Urine: 1.006 (ref 1.005–1.030)
pH: 6 (ref 5.0–8.0)

## 2024-04-04 LAB — PROTEIN / CREATININE RATIO, URINE
Creatinine, Urine: 39 mg/dL
Total Protein, Urine: <6 mg/dL

## 2024-04-04 LAB — PHOSPHORUS: Phosphorus: 3.1 mg/dL (ref 2.5–4.6)

## 2024-04-04 LAB — MAGNESIUM: Magnesium: 1.9 mg/dL (ref 1.7–2.4)

## 2024-04-06 LAB — TACROLIMUS LEVEL: Tacrolimus (FK506) - LabCorp: 5.1 ng/mL (ref 5.0–20.0)

## 2024-04-06 LAB — CMV DNA BY PCR, QUALITATIVE: CMV DNA, Qual PCR: NEGATIVE

## 2024-04-12 DIAGNOSIS — R8271 Bacteriuria: Secondary | ICD-10-CM | POA: Diagnosis not present

## 2024-04-12 DIAGNOSIS — Z940 Kidney transplant status: Secondary | ICD-10-CM | POA: Diagnosis not present

## 2024-04-12 DIAGNOSIS — Z780 Asymptomatic menopausal state: Secondary | ICD-10-CM | POA: Diagnosis not present

## 2024-04-18 ENCOUNTER — Other Ambulatory Visit (HOSPITAL_COMMUNITY)
Admission: RE | Admit: 2024-04-18 | Discharge: 2024-04-18 | Disposition: A | Discharge status: Home / Self Care | Source: Ambulatory Visit | Attending: Nephrology | Admitting: Nephrology

## 2024-04-18 DIAGNOSIS — B259 Cytomegaloviral disease, unspecified: Secondary | ICD-10-CM | POA: Diagnosis not present

## 2024-04-18 DIAGNOSIS — E559 Vitamin D deficiency, unspecified: Secondary | ICD-10-CM | POA: Diagnosis not present

## 2024-04-18 DIAGNOSIS — N390 Urinary tract infection, site not specified: Secondary | ICD-10-CM | POA: Diagnosis not present

## 2024-04-18 DIAGNOSIS — D631 Anemia in chronic kidney disease: Secondary | ICD-10-CM | POA: Diagnosis not present

## 2024-04-18 DIAGNOSIS — Z79899 Other long term (current) drug therapy: Secondary | ICD-10-CM | POA: Diagnosis not present

## 2024-04-18 DIAGNOSIS — T8610 Unspecified complication of kidney transplant: Secondary | ICD-10-CM | POA: Diagnosis not present

## 2024-04-18 DIAGNOSIS — E1129 Type 2 diabetes mellitus with other diabetic kidney complication: Secondary | ICD-10-CM | POA: Diagnosis not present

## 2024-04-18 DIAGNOSIS — Z9483 Pancreas transplant status: Secondary | ICD-10-CM | POA: Diagnosis not present

## 2024-04-18 DIAGNOSIS — Z114 Encounter for screening for human immunodeficiency virus [HIV]: Secondary | ICD-10-CM | POA: Insufficient documentation

## 2024-04-18 DIAGNOSIS — Z789 Other specified health status: Secondary | ICD-10-CM | POA: Diagnosis not present

## 2024-04-18 DIAGNOSIS — Z940 Kidney transplant status: Secondary | ICD-10-CM | POA: Diagnosis not present

## 2024-04-18 DIAGNOSIS — Z09 Encounter for follow-up examination after completed treatment for conditions other than malignant neoplasm: Secondary | ICD-10-CM | POA: Diagnosis present

## 2024-04-18 DIAGNOSIS — D899 Disorder involving the immune mechanism, unspecified: Secondary | ICD-10-CM | POA: Diagnosis not present

## 2024-04-18 LAB — CBC WITH DIFFERENTIAL/PLATELET
Abs Immature Granulocytes: 0.02 10*3/uL (ref 0.00–0.07)
Basophils Absolute: 0 10*3/uL (ref 0.0–0.1)
Basophils Relative: 1 %
Eosinophils Absolute: 0.1 10*3/uL (ref 0.0–0.5)
Eosinophils Relative: 3 %
HCT: 40.4 % (ref 36.0–46.0)
Hemoglobin: 12.2 g/dL (ref 12.0–15.0)
Immature Granulocytes: 1 %
Lymphocytes Relative: 21 %
Lymphs Abs: 0.9 10*3/uL (ref 0.7–4.0)
MCH: 26.4 pg (ref 26.0–34.0)
MCHC: 30.2 g/dL (ref 30.0–36.0)
MCV: 87.4 fL (ref 80.0–100.0)
Monocytes Absolute: 0.5 10*3/uL (ref 0.1–1.0)
Monocytes Relative: 12 %
Neutro Abs: 2.8 10*3/uL (ref 1.7–7.7)
Neutrophils Relative %: 62 %
Platelets: 186 10*3/uL (ref 150–400)
RBC: 4.62 MIL/uL (ref 3.87–5.11)
RDW: 15.3 % (ref 11.5–15.5)
WBC: 4.4 10*3/uL (ref 4.0–10.5)
nRBC: 0 % (ref 0.0–0.2)

## 2024-04-18 LAB — MAGNESIUM: Magnesium: 1.7 mg/dL (ref 1.7–2.4)

## 2024-04-18 LAB — BASIC METABOLIC PANEL WITH GFR
Anion gap: 10 (ref 5–15)
BUN: 20 mg/dL (ref 8–23)
CO2: 23 mmol/L (ref 22–32)
Calcium: 9.2 mg/dL (ref 8.9–10.3)
Chloride: 106 mmol/L (ref 98–111)
Creatinine, Ser: 1.19 mg/dL — ABNORMAL HIGH (ref 0.44–1.00)
GFR, Estimated: 49 mL/min — ABNORMAL LOW (ref >60)
Glucose, Bld: 85 mg/dL (ref 70–99)
Potassium: 4.2 mmol/L (ref 3.5–5.1)
Sodium: 139 mmol/L (ref 135–145)

## 2024-04-18 LAB — URINALYSIS, ROUTINE W REFLEX MICROSCOPIC
Bilirubin Urine: NEGATIVE
Glucose, UA: NEGATIVE mg/dL
Hgb urine dipstick: NEGATIVE
Ketones, ur: NEGATIVE mg/dL
Leukocytes,Ua: NEGATIVE
Nitrite: NEGATIVE
Protein, ur: NEGATIVE mg/dL
Specific Gravity, Urine: 1.006 (ref 1.005–1.030)
pH: 6 (ref 5.0–8.0)

## 2024-04-18 LAB — PROTEIN / CREATININE RATIO, URINE
Creatinine, Urine: 34 mg/dL
Total Protein, Urine: <6 mg/dL

## 2024-04-18 LAB — PHOSPHORUS: Phosphorus: 3.7 mg/dL (ref 2.5–4.6)

## 2024-04-19 LAB — URINE CULTURE: Culture: NO GROWTH

## 2024-04-19 LAB — HCV RNA DIAGNOSIS, NAA: HCV RNA, Quantitation: NOT DETECTED [IU]/mL

## 2024-04-19 LAB — TACROLIMUS LEVEL: Tacrolimus (FK506) - LabCorp: 6.6 ng/mL (ref 5.0–20.0)

## 2024-04-20 LAB — CMV DNA BY PCR, QUALITATIVE: CMV DNA, Qual PCR: NEGATIVE

## 2024-05-02 ENCOUNTER — Other Ambulatory Visit (HOSPITAL_COMMUNITY)
Admission: RE | Admit: 2024-05-02 | Discharge: 2024-05-02 | Disposition: A | Discharge status: Home / Self Care | Source: Ambulatory Visit | Attending: Nephrology | Admitting: Nephrology

## 2024-05-02 DIAGNOSIS — Z940 Kidney transplant status: Secondary | ICD-10-CM | POA: Insufficient documentation

## 2024-05-02 LAB — CBC WITH DIFFERENTIAL/PLATELET
Abs Immature Granulocytes: 0.01 10*3/uL (ref 0.00–0.07)
Basophils Absolute: 0 10*3/uL (ref 0.0–0.1)
Basophils Relative: 0 %
Eosinophils Absolute: 0.1 10*3/uL (ref 0.0–0.5)
Eosinophils Relative: 3 %
HCT: 41.9 % (ref 36.0–46.0)
Hemoglobin: 12.4 g/dL (ref 12.0–15.0)
Immature Granulocytes: 0 %
Lymphocytes Relative: 20 %
Lymphs Abs: 1 10*3/uL (ref 0.7–4.0)
MCH: 26.1 pg (ref 26.0–34.0)
MCHC: 29.6 g/dL — ABNORMAL LOW (ref 30.0–36.0)
MCV: 88.2 fL (ref 80.0–100.0)
Monocytes Absolute: 0.5 10*3/uL (ref 0.1–1.0)
Monocytes Relative: 11 %
Neutro Abs: 3.1 10*3/uL (ref 1.7–7.7)
Neutrophils Relative %: 66 %
Platelets: 206 10*3/uL (ref 150–400)
RBC: 4.75 MIL/uL (ref 3.87–5.11)
RDW: 15.3 % (ref 11.5–15.5)
WBC: 4.8 10*3/uL (ref 4.0–10.5)
nRBC: 0 % (ref 0.0–0.2)

## 2024-05-02 LAB — URINALYSIS, W/ REFLEX TO CULTURE (INFECTION SUSPECTED)
Bilirubin Urine: NEGATIVE
Glucose, UA: NEGATIVE mg/dL
Hgb urine dipstick: NEGATIVE
Ketones, ur: NEGATIVE mg/dL
Leukocytes,Ua: NEGATIVE
Nitrite: NEGATIVE
Protein, ur: NEGATIVE mg/dL
Specific Gravity, Urine: 1.002 — ABNORMAL LOW (ref 1.005–1.030)
pH: 6 (ref 5.0–8.0)

## 2024-05-02 LAB — BASIC METABOLIC PANEL WITH GFR
Anion gap: 10 (ref 5–15)
BUN: 20 mg/dL (ref 8–23)
CO2: 24 mmol/L (ref 22–32)
Calcium: 8.9 mg/dL (ref 8.9–10.3)
Chloride: 102 mmol/L (ref 98–111)
Creatinine, Ser: 1.02 mg/dL — ABNORMAL HIGH (ref 0.44–1.00)
GFR, Estimated: 59 mL/min — ABNORMAL LOW (ref >60)
Glucose, Bld: 90 mg/dL (ref 70–99)
Potassium: 3.7 mmol/L (ref 3.5–5.1)
Sodium: 136 mmol/L (ref 135–145)

## 2024-05-02 LAB — PROTEIN / CREATININE RATIO, URINE
Creatinine, Urine: 14 mg/dL
Total Protein, Urine: <6 mg/dL

## 2024-05-02 LAB — MAGNESIUM: Magnesium: 1.8 mg/dL (ref 1.7–2.4)

## 2024-05-02 LAB — PHOSPHORUS: Phosphorus: 3.4 mg/dL (ref 2.5–4.6)

## 2024-05-04 LAB — TACROLIMUS LEVEL: Tacrolimus (FK506) - LabCorp: 9.5 ng/mL (ref 5.0–20.0)

## 2024-05-04 LAB — CMV DNA BY PCR, QUALITATIVE: CMV DNA, Qual PCR: NEGATIVE

## 2024-05-16 ENCOUNTER — Other Ambulatory Visit (HOSPITAL_COMMUNITY)
Admission: RE | Admit: 2024-05-16 | Discharge: 2024-05-16 | Disposition: A | Discharge status: Home / Self Care | Source: Ambulatory Visit | Attending: Nephrology | Admitting: Nephrology

## 2024-05-16 DIAGNOSIS — Z940 Kidney transplant status: Secondary | ICD-10-CM | POA: Diagnosis not present

## 2024-05-16 LAB — URINALYSIS, W/ REFLEX TO CULTURE (INFECTION SUSPECTED)
Bacteria, UA: NONE SEEN
Bilirubin Urine: NEGATIVE
Glucose, UA: NEGATIVE mg/dL
Hgb urine dipstick: NEGATIVE
Ketones, ur: NEGATIVE mg/dL
Leukocytes,Ua: NEGATIVE
Nitrite: NEGATIVE
Protein, ur: NEGATIVE mg/dL
Specific Gravity, Urine: 1.005 (ref 1.005–1.030)
pH: 6 (ref 5.0–8.0)

## 2024-05-16 LAB — PROTEIN / CREATININE RATIO, URINE
Creatinine, Urine: 30 mg/dL
Total Protein, Urine: <6 mg/dL

## 2024-05-17 ENCOUNTER — Other Ambulatory Visit (HOSPITAL_COMMUNITY)
Admission: RE | Admit: 2024-05-17 | Discharge: 2024-05-17 | Disposition: A | Discharge status: Home / Self Care | Source: Ambulatory Visit | Attending: Nephrology | Admitting: Nephrology

## 2024-05-17 DIAGNOSIS — Z940 Kidney transplant status: Secondary | ICD-10-CM | POA: Insufficient documentation

## 2024-05-17 LAB — CBC WITH DIFFERENTIAL/PLATELET
Abs Immature Granulocytes: 0.02 K/uL (ref 0.00–0.07)
Basophils Absolute: 0 K/uL (ref 0.0–0.1)
Basophils Relative: 1 %
Eosinophils Absolute: 0.1 K/uL (ref 0.0–0.5)
Eosinophils Relative: 3 %
HCT: 39.5 % (ref 36.0–46.0)
Hemoglobin: 11.9 g/dL — ABNORMAL LOW (ref 12.0–15.0)
Immature Granulocytes: 1 %
Lymphocytes Relative: 20 %
Lymphs Abs: 0.9 K/uL (ref 0.7–4.0)
MCH: 26.4 pg (ref 26.0–34.0)
MCHC: 30.1 g/dL (ref 30.0–36.0)
MCV: 87.8 fL (ref 80.0–100.0)
Monocytes Absolute: 0.6 K/uL (ref 0.1–1.0)
Monocytes Relative: 14 %
Neutro Abs: 2.7 K/uL (ref 1.7–7.7)
Neutrophils Relative %: 61 %
Platelets: 185 K/uL (ref 150–400)
RBC: 4.5 MIL/uL (ref 3.87–5.11)
RDW: 15.5 % (ref 11.5–15.5)
WBC: 4.4 K/uL (ref 4.0–10.5)
nRBC: 0 % (ref 0.0–0.2)

## 2024-05-17 LAB — BASIC METABOLIC PANEL WITH GFR
Anion gap: 11 (ref 5–15)
BUN: 19 mg/dL (ref 8–23)
CO2: 22 mmol/L (ref 22–32)
Calcium: 9.6 mg/dL (ref 8.9–10.3)
Chloride: 105 mmol/L (ref 98–111)
Creatinine, Ser: 0.91 mg/dL (ref 0.44–1.00)
GFR, Estimated: >60 mL/min (ref >60)
Glucose, Bld: 79 mg/dL (ref 70–99)
Potassium: 4.1 mmol/L (ref 3.5–5.1)
Sodium: 138 mmol/L (ref 135–145)

## 2024-05-17 LAB — PHOSPHORUS: Phosphorus: 3.7 mg/dL (ref 2.5–4.6)

## 2024-05-17 LAB — MAGNESIUM: Magnesium: 1.8 mg/dL (ref 1.7–2.4)

## 2024-05-18 LAB — HCV RNA DIAGNOSIS, NAA: HCV RNA, Quantitation: NOT DETECTED [IU]/mL

## 2024-05-19 LAB — TACROLIMUS LEVEL: Tacrolimus (FK506) - LabCorp: 5.9 ng/mL (ref 5.0–20.0)

## 2024-05-19 LAB — CMV DNA BY PCR, QUALITATIVE: CMV DNA, Qual PCR: NEGATIVE

## 2024-05-31 ENCOUNTER — Other Ambulatory Visit (HOSPITAL_COMMUNITY)
Admission: RE | Admit: 2024-05-31 | Discharge: 2024-05-31 | Disposition: A | Discharge status: Home / Self Care | Source: Ambulatory Visit | Attending: Nephrology | Admitting: Nephrology

## 2024-05-31 DIAGNOSIS — Z79899 Other long term (current) drug therapy: Secondary | ICD-10-CM | POA: Diagnosis not present

## 2024-05-31 DIAGNOSIS — Z789 Other specified health status: Secondary | ICD-10-CM | POA: Insufficient documentation

## 2024-05-31 DIAGNOSIS — T8610 Unspecified complication of kidney transplant: Secondary | ICD-10-CM | POA: Insufficient documentation

## 2024-05-31 DIAGNOSIS — Z9483 Pancreas transplant status: Secondary | ICD-10-CM | POA: Diagnosis not present

## 2024-05-31 DIAGNOSIS — B259 Cytomegaloviral disease, unspecified: Secondary | ICD-10-CM | POA: Diagnosis not present

## 2024-05-31 DIAGNOSIS — E1129 Type 2 diabetes mellitus with other diabetic kidney complication: Secondary | ICD-10-CM | POA: Insufficient documentation

## 2024-05-31 DIAGNOSIS — Z940 Kidney transplant status: Secondary | ICD-10-CM | POA: Diagnosis not present

## 2024-05-31 DIAGNOSIS — N390 Urinary tract infection, site not specified: Secondary | ICD-10-CM | POA: Diagnosis not present

## 2024-05-31 DIAGNOSIS — D631 Anemia in chronic kidney disease: Secondary | ICD-10-CM | POA: Diagnosis not present

## 2024-05-31 DIAGNOSIS — Z09 Encounter for follow-up examination after completed treatment for conditions other than malignant neoplasm: Secondary | ICD-10-CM | POA: Insufficient documentation

## 2024-05-31 DIAGNOSIS — D899 Disorder involving the immune mechanism, unspecified: Secondary | ICD-10-CM | POA: Diagnosis not present

## 2024-05-31 DIAGNOSIS — E559 Vitamin D deficiency, unspecified: Secondary | ICD-10-CM | POA: Diagnosis present

## 2024-05-31 DIAGNOSIS — Z114 Encounter for screening for human immunodeficiency virus [HIV]: Secondary | ICD-10-CM | POA: Diagnosis not present

## 2024-05-31 LAB — CBC WITH DIFFERENTIAL/PLATELET
Abs Immature Granulocytes: 0.02 K/uL (ref 0.00–0.07)
Basophils Absolute: 0 K/uL (ref 0.0–0.1)
Basophils Relative: 0 %
Eosinophils Absolute: 0.1 K/uL (ref 0.0–0.5)
Eosinophils Relative: 3 %
HCT: 39.3 % (ref 36.0–46.0)
Hemoglobin: 11.8 g/dL — ABNORMAL LOW (ref 12.0–15.0)
Immature Granulocytes: 0 %
Lymphocytes Relative: 16 %
Lymphs Abs: 0.7 K/uL (ref 0.7–4.0)
MCH: 25.9 pg — ABNORMAL LOW (ref 26.0–34.0)
MCHC: 30 g/dL (ref 30.0–36.0)
MCV: 86.2 fL (ref 80.0–100.0)
Monocytes Absolute: 0.5 K/uL (ref 0.1–1.0)
Monocytes Relative: 12 %
Neutro Abs: 3.2 K/uL (ref 1.7–7.7)
Neutrophils Relative %: 69 %
Platelets: 191 K/uL (ref 150–400)
RBC: 4.56 MIL/uL (ref 3.87–5.11)
RDW: 15.2 % (ref 11.5–15.5)
WBC: 4.6 K/uL (ref 4.0–10.5)
nRBC: 0 % (ref 0.0–0.2)

## 2024-05-31 LAB — URINALYSIS, ROUTINE W REFLEX MICROSCOPIC
Bilirubin Urine: NEGATIVE
Glucose, UA: NEGATIVE mg/dL
Hgb urine dipstick: NEGATIVE
Ketones, ur: NEGATIVE mg/dL
Leukocytes,Ua: NEGATIVE
Nitrite: NEGATIVE
Protein, ur: NEGATIVE mg/dL
Specific Gravity, Urine: 1.005 (ref 1.005–1.030)
pH: 5 (ref 5.0–8.0)

## 2024-05-31 LAB — PHOSPHORUS: Phosphorus: 3.5 mg/dL (ref 2.5–4.6)

## 2024-05-31 LAB — BASIC METABOLIC PANEL WITH GFR
Anion gap: 9 (ref 5–15)
BUN: 20 mg/dL (ref 8–23)
CO2: 23 mmol/L (ref 22–32)
Calcium: 9 mg/dL (ref 8.9–10.3)
Chloride: 103 mmol/L (ref 98–111)
Creatinine, Ser: 0.79 mg/dL (ref 0.44–1.00)
GFR, Estimated: >60 mL/min (ref >60)
Glucose, Bld: 89 mg/dL (ref 70–99)
Potassium: 4 mmol/L (ref 3.5–5.1)
Sodium: 135 mmol/L (ref 135–145)

## 2024-05-31 LAB — MAGNESIUM: Magnesium: 1.9 mg/dL (ref 1.7–2.4)

## 2024-05-31 LAB — PROTEIN / CREATININE RATIO, URINE
Creatinine, Urine: 30 mg/dL
Total Protein, Urine: <6 mg/dL

## 2024-06-01 LAB — URINE CULTURE

## 2024-06-01 LAB — CYTOMEGALOVIRUS DNA, QUANTITATIVE REAL-TIME PCR, PLASMA
CMV DNA Quant: NEGATIVE [IU]/mL
Log10 CMV Qn DNA Pl: UNDETERMINED {Log_IU}/mL

## 2024-06-02 LAB — TACROLIMUS LEVEL: Tacrolimus (FK506) - LabCorp: 5.9 ng/mL (ref 5.0–20.0)

## 2024-06-13 ENCOUNTER — Other Ambulatory Visit (HOSPITAL_COMMUNITY)
Admission: RE | Admit: 2024-06-13 | Discharge: 2024-06-13 | Disposition: A | Discharge status: Home / Self Care | Source: Ambulatory Visit | Attending: Nephrology | Admitting: Nephrology

## 2024-06-13 DIAGNOSIS — Z79899 Other long term (current) drug therapy: Secondary | ICD-10-CM | POA: Insufficient documentation

## 2024-06-13 DIAGNOSIS — Z09 Encounter for follow-up examination after completed treatment for conditions other than malignant neoplasm: Secondary | ICD-10-CM | POA: Diagnosis not present

## 2024-06-13 DIAGNOSIS — Z9483 Pancreas transplant status: Secondary | ICD-10-CM | POA: Insufficient documentation

## 2024-06-13 DIAGNOSIS — Z940 Kidney transplant status: Secondary | ICD-10-CM | POA: Diagnosis not present

## 2024-06-13 DIAGNOSIS — Z789 Other specified health status: Secondary | ICD-10-CM | POA: Diagnosis not present

## 2024-06-13 DIAGNOSIS — D899 Disorder involving the immune mechanism, unspecified: Secondary | ICD-10-CM | POA: Diagnosis not present

## 2024-06-13 DIAGNOSIS — N390 Urinary tract infection, site not specified: Secondary | ICD-10-CM | POA: Insufficient documentation

## 2024-06-13 DIAGNOSIS — B259 Cytomegaloviral disease, unspecified: Secondary | ICD-10-CM | POA: Diagnosis not present

## 2024-06-13 DIAGNOSIS — Z114 Encounter for screening for human immunodeficiency virus [HIV]: Secondary | ICD-10-CM | POA: Diagnosis not present

## 2024-06-13 DIAGNOSIS — D631 Anemia in chronic kidney disease: Secondary | ICD-10-CM | POA: Diagnosis not present

## 2024-06-13 DIAGNOSIS — T8610 Unspecified complication of kidney transplant: Secondary | ICD-10-CM | POA: Diagnosis not present

## 2024-06-13 DIAGNOSIS — E559 Vitamin D deficiency, unspecified: Secondary | ICD-10-CM | POA: Diagnosis present

## 2024-06-13 DIAGNOSIS — E1129 Type 2 diabetes mellitus with other diabetic kidney complication: Secondary | ICD-10-CM | POA: Diagnosis not present

## 2024-06-13 LAB — URINALYSIS, W/ REFLEX TO CULTURE (INFECTION SUSPECTED)
Bacteria, UA: NONE SEEN
Bilirubin Urine: NEGATIVE
Glucose, UA: NEGATIVE mg/dL
Hgb urine dipstick: NEGATIVE
Ketones, ur: NEGATIVE mg/dL
Leukocytes,Ua: NEGATIVE
Nitrite: NEGATIVE
Protein, ur: NEGATIVE mg/dL
Specific Gravity, Urine: 1.006 (ref 1.005–1.030)
pH: 6 (ref 5.0–8.0)

## 2024-06-13 LAB — CBC WITH DIFFERENTIAL/PLATELET
Abs Immature Granulocytes: 0.03 K/uL (ref 0.00–0.07)
Basophils Absolute: 0 K/uL (ref 0.0–0.1)
Basophils Relative: 0 %
Eosinophils Absolute: 0.1 K/uL (ref 0.0–0.5)
Eosinophils Relative: 2 %
HCT: 38.1 % (ref 36.0–46.0)
Hemoglobin: 11.4 g/dL — ABNORMAL LOW (ref 12.0–15.0)
Immature Granulocytes: 1 %
Lymphocytes Relative: 15 %
Lymphs Abs: 0.9 K/uL (ref 0.7–4.0)
MCH: 26 pg (ref 26.0–34.0)
MCHC: 29.9 g/dL — ABNORMAL LOW (ref 30.0–36.0)
MCV: 86.8 fL (ref 80.0–100.0)
Monocytes Absolute: 0.5 K/uL (ref 0.1–1.0)
Monocytes Relative: 9 %
Neutro Abs: 4.2 K/uL (ref 1.7–7.7)
Neutrophils Relative %: 73 %
Platelets: 212 K/uL (ref 150–400)
RBC: 4.39 MIL/uL (ref 3.87–5.11)
RDW: 14.8 % (ref 11.5–15.5)
WBC: 5.7 K/uL (ref 4.0–10.5)
nRBC: 0 % (ref 0.0–0.2)

## 2024-06-13 LAB — BASIC METABOLIC PANEL WITH GFR
Anion gap: 12 (ref 5–15)
BUN: 19 mg/dL (ref 8–23)
CO2: 22 mmol/L (ref 22–32)
Calcium: 9.1 mg/dL (ref 8.9–10.3)
Chloride: 101 mmol/L (ref 98–111)
Creatinine, Ser: 1.05 mg/dL — ABNORMAL HIGH (ref 0.44–1.00)
GFR, Estimated: 57 mL/min — ABNORMAL LOW (ref >60)
Glucose, Bld: 89 mg/dL (ref 70–99)
Potassium: 3.7 mmol/L (ref 3.5–5.1)
Sodium: 135 mmol/L (ref 135–145)

## 2024-06-13 LAB — PHOSPHORUS: Phosphorus: 3.4 mg/dL (ref 2.5–4.6)

## 2024-06-13 LAB — PROTEIN / CREATININE RATIO, URINE
Creatinine, Urine: 39 mg/dL
Total Protein, Urine: <6 mg/dL

## 2024-06-13 LAB — MAGNESIUM: Magnesium: 1.8 mg/dL (ref 1.7–2.4)

## 2024-06-14 LAB — TACROLIMUS LEVEL: Tacrolimus (FK506) - LabCorp: 7.1 ng/mL (ref 5.0–20.0)

## 2024-06-14 LAB — URINE CULTURE: Culture: <10000 — AB

## 2024-06-15 LAB — CYTOMEGALOVIRUS DNA, QUANTITATIVE REAL-TIME PCR, PLASMA
CMV DNA Quant: NEGATIVE [IU]/mL
Log10 CMV Qn DNA Pl: UNDETERMINED {Log_IU}/mL

## 2024-06-29 DIAGNOSIS — N184 Chronic kidney disease, stage 4 (severe): Secondary | ICD-10-CM | POA: Diagnosis not present

## 2024-06-29 DIAGNOSIS — E119 Type 2 diabetes mellitus without complications: Secondary | ICD-10-CM | POA: Diagnosis not present

## 2024-06-29 DIAGNOSIS — D849 Immunodeficiency, unspecified: Secondary | ICD-10-CM | POA: Diagnosis not present

## 2024-06-29 DIAGNOSIS — I10 Essential (primary) hypertension: Secondary | ICD-10-CM | POA: Diagnosis not present

## 2024-06-29 DIAGNOSIS — N390 Urinary tract infection, site not specified: Secondary | ICD-10-CM | POA: Diagnosis not present

## 2024-06-29 DIAGNOSIS — Z940 Kidney transplant status: Secondary | ICD-10-CM | POA: Diagnosis not present

## 2024-06-29 DIAGNOSIS — N1831 Chronic kidney disease, stage 3a: Secondary | ICD-10-CM | POA: Diagnosis not present

## 2024-07-06 DIAGNOSIS — R7303 Prediabetes: Secondary | ICD-10-CM | POA: Diagnosis not present

## 2024-07-06 DIAGNOSIS — Z940 Kidney transplant status: Secondary | ICD-10-CM | POA: Diagnosis not present

## 2024-07-06 DIAGNOSIS — Z7689 Persons encountering health services in other specified circumstances: Secondary | ICD-10-CM | POA: Diagnosis not present

## 2024-07-06 DIAGNOSIS — N390 Urinary tract infection, site not specified: Secondary | ICD-10-CM | POA: Diagnosis not present

## 2024-07-06 DIAGNOSIS — N2581 Secondary hyperparathyroidism of renal origin: Secondary | ICD-10-CM | POA: Diagnosis not present

## 2024-07-06 DIAGNOSIS — I10 Essential (primary) hypertension: Secondary | ICD-10-CM | POA: Diagnosis not present

## 2024-07-26 DIAGNOSIS — R339 Retention of urine, unspecified: Secondary | ICD-10-CM | POA: Diagnosis not present

## 2024-08-04 DIAGNOSIS — Z79899 Other long term (current) drug therapy: Secondary | ICD-10-CM | POA: Diagnosis not present

## 2024-08-04 DIAGNOSIS — Z940 Kidney transplant status: Secondary | ICD-10-CM | POA: Diagnosis not present

## 2024-08-04 DIAGNOSIS — N390 Urinary tract infection, site not specified: Secondary | ICD-10-CM | POA: Diagnosis not present

## 2024-08-04 DIAGNOSIS — D84821 Immunodeficiency due to drugs: Secondary | ICD-10-CM | POA: Diagnosis not present

## 2024-08-04 DIAGNOSIS — Z23 Encounter for immunization: Secondary | ICD-10-CM | POA: Diagnosis not present

## 2024-08-04 DIAGNOSIS — I151 Hypertension secondary to other renal disorders: Secondary | ICD-10-CM | POA: Diagnosis not present

## 2024-08-04 DIAGNOSIS — Z0000 Encounter for general adult medical examination without abnormal findings: Secondary | ICD-10-CM | POA: Diagnosis not present

## 2024-08-04 DIAGNOSIS — M3214 Glomerular disease in systemic lupus erythematosus: Secondary | ICD-10-CM | POA: Diagnosis not present

## 2024-08-10 DIAGNOSIS — E870 Hyperosmolality and hypernatremia: Secondary | ICD-10-CM | POA: Diagnosis not present

## 2024-08-10 DIAGNOSIS — Z940 Kidney transplant status: Secondary | ICD-10-CM | POA: Diagnosis not present

## 2024-08-10 DIAGNOSIS — N271 Small kidney, bilateral: Secondary | ICD-10-CM | POA: Diagnosis not present
